# Patient Record
Sex: Female | Born: 1935 | Race: White | Hispanic: No | Smoking: Former smoker
Health system: Southern US, Community
[De-identification: ages and names within clinical notes are randomized; demographics above are authoritative.]

## PROBLEM LIST (undated history)

## (undated) DIAGNOSIS — R001 Bradycardia, unspecified: Secondary | ICD-10-CM

## (undated) DIAGNOSIS — J449 Chronic obstructive pulmonary disease, unspecified: Secondary | ICD-10-CM

## (undated) DIAGNOSIS — I480 Paroxysmal atrial fibrillation: Secondary | ICD-10-CM

## (undated) DIAGNOSIS — L408 Other psoriasis: Secondary | ICD-10-CM

## (undated) DIAGNOSIS — F0390 Unspecified dementia without behavioral disturbance: Secondary | ICD-10-CM

## (undated) DIAGNOSIS — I10 Essential (primary) hypertension: Secondary | ICD-10-CM

## (undated) DIAGNOSIS — C4490 Unspecified malignant neoplasm of skin, unspecified: Secondary | ICD-10-CM

## (undated) DIAGNOSIS — J189 Pneumonia, unspecified organism: Secondary | ICD-10-CM

## (undated) DIAGNOSIS — J9610 Chronic respiratory failure, unspecified whether with hypoxia or hypercapnia: Secondary | ICD-10-CM

## (undated) DIAGNOSIS — M810 Age-related osteoporosis without current pathological fracture: Secondary | ICD-10-CM

## (undated) DIAGNOSIS — N183 Chronic kidney disease, stage 3 unspecified: Secondary | ICD-10-CM

## (undated) DIAGNOSIS — I350 Nonrheumatic aortic (valve) stenosis: Secondary | ICD-10-CM

## (undated) DIAGNOSIS — D649 Anemia, unspecified: Secondary | ICD-10-CM

## (undated) HISTORY — PX: HIP SURGERY: SHX245

## (undated) HISTORY — DX: Bradycardia, unspecified: R00.1

## (undated) HISTORY — PX: CATARACT EXTRACTION: SUR2

## (undated) HISTORY — DX: Chronic kidney disease, stage 3 (moderate): N18.3

## (undated) HISTORY — DX: Chronic obstructive pulmonary disease, unspecified: J44.9

## (undated) HISTORY — DX: Paroxysmal atrial fibrillation: I48.0

## (undated) HISTORY — DX: Nonrheumatic aortic (valve) stenosis: I35.0

## (undated) HISTORY — DX: Unspecified malignant neoplasm of skin, unspecified: C44.90

## (undated) HISTORY — DX: Unspecified dementia, unspecified severity, without behavioral disturbance, psychotic disturbance, mood disturbance, and anxiety: F03.90

## (undated) HISTORY — DX: Chronic kidney disease, stage 3 unspecified: N18.30

## (undated) HISTORY — DX: Chronic respiratory failure, unspecified whether with hypoxia or hypercapnia: J96.10

## (undated) HISTORY — DX: Age-related osteoporosis without current pathological fracture: M81.0

## (undated) HISTORY — DX: Other psoriasis: L40.8

## (undated) HISTORY — DX: Essential (primary) hypertension: I10

---

## 1962-04-02 HISTORY — PX: BREAST SURGERY: SHX581

## 2010-09-25 ENCOUNTER — Inpatient Hospital Stay (HOSPITAL_COMMUNITY)
Admission: EM | Admit: 2010-09-25 | Discharge: 2010-09-29 | DRG: 189 | Disposition: A | Discharge status: Home Health | Payer: Medicare Other | Attending: Family Medicine | Admitting: Family Medicine

## 2010-09-25 ENCOUNTER — Emergency Department (HOSPITAL_COMMUNITY): Payer: Medicare Other

## 2010-09-25 DIAGNOSIS — J441 Chronic obstructive pulmonary disease with (acute) exacerbation: Secondary | ICD-10-CM | POA: Diagnosis present

## 2010-09-25 DIAGNOSIS — Z7982 Long term (current) use of aspirin: Secondary | ICD-10-CM

## 2010-09-25 DIAGNOSIS — D696 Thrombocytopenia, unspecified: Secondary | ICD-10-CM | POA: Diagnosis present

## 2010-09-25 DIAGNOSIS — F17200 Nicotine dependence, unspecified, uncomplicated: Secondary | ICD-10-CM | POA: Diagnosis present

## 2010-09-25 DIAGNOSIS — I10 Essential (primary) hypertension: Secondary | ICD-10-CM | POA: Diagnosis present

## 2010-09-25 DIAGNOSIS — D751 Secondary polycythemia: Secondary | ICD-10-CM | POA: Diagnosis present

## 2010-09-25 DIAGNOSIS — J9600 Acute respiratory failure, unspecified whether with hypoxia or hypercapnia: Principal | ICD-10-CM | POA: Diagnosis present

## 2010-09-25 DIAGNOSIS — R9431 Abnormal electrocardiogram [ECG] [EKG]: Secondary | ICD-10-CM | POA: Diagnosis present

## 2010-09-25 DIAGNOSIS — I279 Pulmonary heart disease, unspecified: Secondary | ICD-10-CM | POA: Diagnosis present

## 2010-09-25 DIAGNOSIS — R5381 Other malaise: Secondary | ICD-10-CM | POA: Diagnosis present

## 2010-09-25 LAB — CBC
MCH: 30.4 pg (ref 26.0–34.0)
MCHC: 32.8 g/dL (ref 30.0–36.0)
RDW: 15.3 % (ref 11.5–15.5)

## 2010-09-25 LAB — POCT I-STAT, CHEM 8
Calcium, Ion: 1.03 mmol/L — ABNORMAL LOW (ref 1.12–1.32)
Glucose, Bld: 97 mg/dL (ref 70–99)
HCT: 66 % — ABNORMAL HIGH (ref 36.0–46.0)
Hemoglobin: 22.4 g/dL (ref 12.0–15.0)
Potassium: 3.6 mEq/L (ref 3.5–5.1)
TCO2: 39 mmol/L (ref 0–100)

## 2010-09-25 LAB — DIFFERENTIAL
Basophils Absolute: 0.1 10*3/uL (ref 0.0–0.1)
Basophils Relative: 1 % (ref 0–1)
Eosinophils Absolute: 0 10*3/uL (ref 0.0–0.7)
Eosinophils Relative: 0 % (ref 0–5)
Monocytes Absolute: 0.5 10*3/uL (ref 0.1–1.0)
Neutro Abs: 3 10*3/uL (ref 1.7–7.7)

## 2010-09-25 LAB — CK TOTAL AND CKMB (NOT AT ARMC): Total CK: 77 U/L (ref 7–177)

## 2010-09-25 LAB — PRO B NATRIURETIC PEPTIDE: Pro B Natriuretic peptide (BNP): 19242 pg/mL — ABNORMAL HIGH (ref 0–450)

## 2010-09-26 DIAGNOSIS — R0602 Shortness of breath: Secondary | ICD-10-CM

## 2010-09-26 LAB — BLOOD GAS, ARTERIAL
Acid-Base Excess: 15.1 mmol/L — ABNORMAL HIGH (ref 0.0–2.0)
Drawn by: 24513
TCO2: 43.7 mmol/L (ref 0–100)
pCO2 arterial: 76.9 mmHg (ref 35.0–45.0)
pH, Arterial: 7.35 (ref 7.350–7.400)
pO2, Arterial: 35.6 mmHg — CL (ref 80.0–100.0)

## 2010-09-26 LAB — CBC
MCV: 93.8 fL (ref 78.0–100.0)
Platelets: 92 10*3/uL — ABNORMAL LOW (ref 150–400)
RBC: 5.97 MIL/uL — ABNORMAL HIGH (ref 3.87–5.11)
RDW: 15.5 % (ref 11.5–15.5)
WBC: 4.4 10*3/uL (ref 4.0–10.5)

## 2010-09-26 LAB — BASIC METABOLIC PANEL
CO2: 39 mEq/L — ABNORMAL HIGH (ref 19–32)
Chloride: 95 mEq/L — ABNORMAL LOW (ref 96–112)
Creatinine, Ser: 0.58 mg/dL (ref 0.50–1.10)
GFR calc Af Amer: >60 mL/min (ref >60)
Potassium: 4.5 mEq/L (ref 3.5–5.1)

## 2010-09-26 LAB — DIFFERENTIAL
Basophils Absolute: 0.1 10*3/uL (ref 0.0–0.1)
Eosinophils Absolute: 0.1 10*3/uL (ref 0.0–0.7)
Eosinophils Relative: 1 % (ref 0–5)
Lymphocytes Relative: 24 % (ref 12–46)
Lymphs Abs: 1 10*3/uL (ref 0.7–4.0)
Neutrophils Relative %: 59 % (ref 43–77)

## 2010-09-26 LAB — MRSA PCR SCREENING: MRSA by PCR: NEGATIVE

## 2010-09-28 LAB — CBC
HCT: 53.8 % — ABNORMAL HIGH (ref 36.0–46.0)
Hemoglobin: 17.6 g/dL — ABNORMAL HIGH (ref 12.0–15.0)
MCV: 92.8 fL (ref 78.0–100.0)
RBC: 5.8 MIL/uL — ABNORMAL HIGH (ref 3.87–5.11)
WBC: 6.1 10*3/uL (ref 4.0–10.5)

## 2010-09-28 LAB — BASIC METABOLIC PANEL
BUN: 13 mg/dL (ref 6–23)
CO2: 38 mEq/L — ABNORMAL HIGH (ref 19–32)
Chloride: 95 mEq/L — ABNORMAL LOW (ref 96–112)
Creatinine, Ser: 0.71 mg/dL (ref 0.50–1.10)
GFR calc Af Amer: >60 mL/min (ref >60)
Glucose, Bld: 137 mg/dL — ABNORMAL HIGH (ref 70–99)
Potassium: 4.1 mEq/L (ref 3.5–5.1)

## 2010-09-28 LAB — URINALYSIS, ROUTINE W REFLEX MICROSCOPIC
Bilirubin Urine: NEGATIVE
Glucose, UA: NEGATIVE mg/dL
Hgb urine dipstick: NEGATIVE
Ketones, ur: NEGATIVE mg/dL
Nitrite: NEGATIVE
Specific Gravity, Urine: 1.018 (ref 1.005–1.030)
pH: 6.5 (ref 5.0–8.0)

## 2010-09-29 ENCOUNTER — Encounter: Payer: Self-pay | Admitting: Emergency Medicine

## 2010-09-29 LAB — URINE CULTURE
Colony Count: NO GROWTH
Culture  Setup Time: 201206281617

## 2010-10-02 ENCOUNTER — Ambulatory Visit (INDEPENDENT_AMBULATORY_CARE_PROVIDER_SITE_OTHER): Payer: Medicare Other | Admitting: Emergency Medicine

## 2010-10-02 ENCOUNTER — Telehealth: Payer: Self-pay | Admitting: Emergency Medicine

## 2010-10-02 ENCOUNTER — Encounter: Payer: Self-pay | Admitting: Emergency Medicine

## 2010-10-02 DIAGNOSIS — J438 Other emphysema: Secondary | ICD-10-CM

## 2010-10-02 DIAGNOSIS — I10 Essential (primary) hypertension: Secondary | ICD-10-CM | POA: Insufficient documentation

## 2010-10-02 DIAGNOSIS — J449 Chronic obstructive pulmonary disease, unspecified: Secondary | ICD-10-CM

## 2010-10-02 MED ORDER — IPRATROPIUM-ALBUTEROL 0.5-2.5 (3) MG/3ML IN SOLN: 3.0000 mL | Freq: Four times a day (QID) | RESPIRATORY_TRACT | Status: DC

## 2010-10-02 NOTE — Assessment & Plan Note (Signed)
Will change the combivent bid to DuoNebs qid  + pulmicort nebs bid Consider full PFT at a later date Congratulated her smoking cessation O2 at all times.

## 2010-10-02 NOTE — Patient Instructions (Signed)
Start using DuoNebs 4 times a day Use your budesonide nebs 2 times a day Wear your oxygen at all times Follow up with Dr Delton Coombes in 1 month

## 2010-10-02 NOTE — Progress Notes (Signed)
  Subjective:    Patient ID: Paula Contreras, female    DOB: 1934/01/06, 77 y.o.   MRN: [id number]  HPI 77 yo longstanding smoker (~100 pk-yrs), never dx w COPD before but was admitted to [facility] 6/12. Found to be hypoxic, started on O2 and on combivent bid + pulmicort. Minimal exertional tolerance. Had been dealing w fatigue, malaise - may be a bit better since the hospitalization.     Review of Systems  Constitutional: Positive for appetite change and unexpected weight change.  HENT: Positive for congestion.   Respiratory: Positive for cough and shortness of breath.    Past Medical History  Diagnosis Date  . Cataract   . Other psoriasis and similar disorders     psoriasis vs eczema     Family History  Problem Relation Age of Onset  . Asthma Mother   . Asthma Maternal Grandmother   . Lung cancer Father   . Lung cancer Paternal Uncle     father's twin brother     History   Social History  . Marital Status: Unknown    Spouse Name: N/A    Number of Children: N/A  . Years of Education: N/A   Occupational History  . data entry specialist     retired   Social History Main Topics  . Smoking status: Former Smoker -- 1.0 packs/day for 55 years    Quit date: 09/25/2010  . Smokeless tobacco: Not on file  . Alcohol Use: No  . Drug Use: No  . Sexually Active: Not on file   Other Topics Concern  . Not on file   Social History Narrative  . No narrative on file     Allergies  Allergen Reactions  . Codeine Nausea And Vomiting     No outpatient prescriptions prior to visit.         Objective:   Physical Exam Gen: Pleasant, thin, in no distress,  normal affect on O2  ENT: No lesions,  mouth clear,  oropharynx clear, no postnasal drip, decreased hearing  Neck: No JVD, no TMG, no carotid bruits  Lungs: distant no wheezing  Cardiovascular: RRR, heart sounds normal, no murmur or gallops, no peripheral edema  Musculoskeletal: No deformities, no cyanosis or  clubbing  Neuro: alert, non focal  Skin: Warm, no lesions or rashes        Assessment & Plan:  COPD (chronic obstructive pulmonary disease) Will change the combivent bid to DuoNebs qid  + pulmicort nebs bid Consider full PFT at a later date Congratulated her smoking cessation O2 at all times.

## 2010-10-02 NOTE — H&P (Signed)
Paula Contreras, Paula Contreras NO.:  [id number]  MEDICAL RECORD NO.:  [id number]  LOCATION:  [facility]                         FACILITY:  [facility]  PHYSICIAN:  Paula Contreras, M.D.DATE OF BIRTH:  Feb 19, 1934  DATE OF ADMISSION:  09/25/2010 DATE OF DISCHARGE:                             HISTORY & PHYSICAL   PRIMARY CARE PHYSICIAN:  Unassigned.  CHIEF COMPLAINT:  Hypoxia.  PRESENT ILLNESS:  Ms. Paula Contreras is a very pleasant 77 year old female who lives independently in the [place] area.  She has not sought regular medical attention and does not have a primary care physician.  She has had no type of primary care or routine screening medical care in 50+ years.  She has known cataract and [facility] to have her cataracts extracted.  While there she was found to have room air O2 sats of 65-68. She denied any symptoms whatsoever.  She was sent to the emergency room for evaluation.  In the emergency room, the patient had O2 saturations of 75-77% on room air.  Again she denied symptoms.  She does admit to smoking reportedly 1 pack a day for approximately 50-55 years.  She denies occupational exposure to cotton dust or other known pulmonary pathogens.  She denies any chest pain.  She states that she is somnolent quite frequently but she is able to exert herself without any difficulty.  She shops for herself and drives without difficulty.  She denies any fevers, chills, cough, or expectoration of sputum.  REVIEW OF SYSTEMS:  Comprehensive review of systems is unremarkable with exception to the positive elements noted in the history of present illness above.  PAST MEDICAL HISTORY: 1. Cataracts. 2. Possible psoriasis versus eczema - followed by Dr. Dorinda Contreras.  OUTPATIENT MEDICATIONS:  None.  ALLERGIES:  CODEINE.  FAMILY HISTORY:  Noncontributory but reviewed in detail with the patient.  SOCIAL HISTORY:  The  patient lives in [place].  She lives by herself. She is retired as a Dietitian.  She does not consume alcohol to significant amounts.  DATA REVIEWED:  Hemoglobin is 19.2.  White count and platelet count are normal.  Sodium, potassium chloride, BUN, creatinine, and serum glucose are normal.  Bicarb is elevated at 39.  Pro BNP was 19,242.  Troponin was within normal range.  Chest x-ray reveals severe COPD with flattening of the diaphragms.  A 12-lead EKG reveals 76 beats per minute with sinus rhythm but diffusely appreciated T-wave inversions.  PHYSICAL EXAMINATION:  VITAL SIGNS:  Temperature 98.9, Contreras pressure 155/89, heart rate 75, respiratory rate 18, O2 sats 93% on 2 liters per minute nasal cannula and 77% on room air. GENERAL:  Thin frail-appearing female with a barrel chest, but in no acute respiratory distress. HEENT:  Normocephalic, atraumatic.  Pupils are equal, round, and reactive.  Oral cavity and oral mucosa are unrevealing and unremarkable. NECK:  No appreciable JVD at 30 degrees. LUNGS:  Very poor air movement throughout all fields with very distant breath sounds.  Mild diffuse expiratory wheezes appreciable and there are no focal crackles.  CARDIOVASCULAR:  Heart sounds are distant but the patient has regular rate and rhythm  without gallop or rub or appreciable murmur with a normal S1 and S2. ABDOMEN:  Nontender, nondistended, soft bowel sounds present.  No organomegaly.  No rebound or ascites. EXTREMITIES:  Trace bilateral lower extremity edema without cyanosis or clubbing. CUTANEOUS:  The patient has a large silver plaque on the anterior frontal region of her scalp just inside her hairline which she states is chronic and appears to be consistent with psoriasis versus eczema.  She has the same type plaque within her external auditory canals and on her legs bilaterally. NEUROLOGIC:  Alert and oriented x4.  Cranial nerves II-XII intact bilaterally, 4+/5  strength in bilateral upper and lower extremities. Intact sensation to touch throughout.  No Babinski.  IMPRESSION AND PLAN: 1. Severe hypoxia - Ms. [name] is suffering with severe hypoxia.     This is likely due to previously undiagnosed severe emphysema due     to a longstanding history of tobacco abuse.  I suspect that she     actually smokes more than a pack a day.  In that the patient has no     primary care physician and no reliable followup, we will need to     admit her to the hospital under observation status to assure that     all arrangements can be made for her to get home O2.  She will also     need consistent outpatient followup for her emphysema.     Fortunately, she is asymptomatic at the present time.  It is not     likely that she would remain that way much longer.  We will     administer O2 at 2 liters per minute nasal cannula for now.  In the     morning, I will assess a baseline ABG on room air.  Additionally,     we will obtain PFTs, both pre and post bronchodilator therapy and     also with a DLCO.  She will be referred to pulmonologist at the     time of her discharge if clearly indicated based on symptomatology.     We will initiate beta agonist bronchodilators for now and consider     their ongoing empiric use at the time of her discharge. 2. Polycythemia - this appears to be clearly a secondary polycythemia     due to chronic hypoxia in the phase of ongoing tobacco abuse.  We     will place the patient on aspirin a day.  We will monitor her     hemoglobin, but I doubt that any further treatment short of ongoing     use of oxygen will be required.  There is very little suspicion of     polycythemia vera in this clinical situation. 3. Markedly elevated pro BNP - this is likely secondary to pulmonary     hypertension and possibly even right-sided heart failure.  We will     check a transthoracic echocardiogram to approximate the patient's     pulmonary artery  pressures as the patient may need consideration     for evaluation as treatment of severe pulmonary hypertension. 4. EKG changes - the patient has never had chest pain or even dyspnea     on exertion by her report.  She does have diffuse T-wave changes,     specifically most appreciable in the inferior leads and some of the     lateral and septal leads.  We will obtain a transthoracic     echocardiogram  for the dual indication of wall motion abnormalities     as well as right heart failure as discussed above.  If wall motion     abnormalities are seen, she would then need referral for cardiac     eval as well. 5. Primary care issues - Ms. Laske admits that she has not had a     Pap smear nor a mammogram in "quite some time."  I suspect this     means never.  She will need multiple screening procedures done.     This will be referred to her primary care physician at the time of     her discharge.  We will ask social work to assist in arranging home     O2 and also in connecting her with a followup appointment with the     primary care physician prior to her discharge. 6. Elevated Contreras pressure - at this time I suspect the patient     probably has a chronic longstanding untreated hypertension.  We     cannot make that diagnosis at this time.  We will need to follow     the patient's Contreras pressure trend and consider initiating     treatment if her Contreras pressure remains elevated through her     hospital stay.     Paula Contreras, M.D.     JTM/MEDQ  D:  09/25/2010  T:  09/25/2010  Job:  [id number]  Electronically Signed by Jetty Duhamel M.D. on 10/02/2010 06:33:31 PM

## 2010-10-02 NOTE — Telephone Encounter (Signed)
Will forward to [facility], per Lawson Fiscal, they have the rx for this to give to [facility].

## 2010-10-02 NOTE — Telephone Encounter (Signed)
Order was given to [facility] today to get nebulizer and meds

## 2010-10-02 NOTE — Discharge Summary (Signed)
Paula Contreras, Paula Contreras NO.:  [id number]  MEDICAL RECORD NO.:  [id number]  LOCATION:  5123                         FACILITY:  [facility]  PHYSICIAN:  Lonia Blood, M.D.DATE OF BIRTH:  1933/06/27  DATE OF ADMISSION:  09/25/2010 DATE OF DISCHARGE:  09/29/2010                              DISCHARGE SUMMARY   DISCHARGING PHYSICIAN:  Lonia Blood, MD  PRIMARY CARE PHYSICIAN:  Unassigned at time of admission.  CHIEF COMPLAINT/REASON FOR ADMISSION:  Paula Contreras is a 77 year old female patient, who was sent over to the ER after being found to be acutely hypoxic.  She was at the eye surgeon's office and was preparing to undergo cataract procedure and was found to have room air saturations of 65% to 68%.  In the emergency department, the patient had room air saturations of 75% to 77%.  She does have a longstanding history of smoking 1 packet of cigarettes a day for at least 50-55 years.  Uponinitial evaluation by the admitting physician, she was somnolent but was arousable and able to exert herself without any difficulty.  On initial clinical exam, the patient was afebrile with temperature of 98.9, BP 155/89, heart rate 75, respirations 18, and O2 sats 93% on 2 liters per minute 77% on room air.  General appearance was a frail female with barrel chest, no acute respiratory distress.  Physical exam was otherwise unremarkable.  The patient also was noted to have a large silver plaque on the anterior frontal region of her scalp, which she states is chronic.  This plaque has the appearance consistent with psoriasis, possible eczema.  The patient endorses she has already been following with a dermatologist for this.  Please refer to the dictated history and physical exam for further details.  PAST MEDICAL HISTORY: 1. Cataracts. 2. Psoriasis versus eczema followed by Paula Contreras. 3. Longstanding tobacco abuse one pack a day times 50-55 years.  ADMITTING  DIAGNOSES: 1. Acute hypoxemic respiratory failure, presumed secondary to     undiagnosed severe emphysema related to longstanding tobacco abuse. 2. Asymptomatic polycythemia, most likely this is secondary due to     chronic hypoxemia from ongoing tobacco abuse. 3. Markedly elevated proBNP 19,242, most likely related to pulmonary     hypertension and right-sided heart failure. 4. Nonspecific diffuse ST changes on EKG in a patient without chest     pain. 5. Hypertension, untreated at time of admission. 6. Primary care issues.  The patient has not seen a primary care     physician in multiple years.  DIAGNOSTICS: 1. Two-view chest x-ray on September 25, 2010, shows severe COPD and     chronic interstitial lung disease.  No active disease. 2. EKG on September 25, 2010, shows nonspecific ST changes involving T-wave     inversion in leads II, III, aVF, V1 through V4 without any ST-     segment elevation, normal QRS with no evidence of left bundle-     branch block. 3. 2-D echocardiogram on September 26, 2010, that shows mild LVH.  There is    systolic function with an estimated ejection fraction of 65% to 70%     and mild dilatation of the  right ventricle with tricuspid valve     demonstrated trivial regurgitation and no mention of whether the     patient had underlying pulmonary hypertension.  LABORATORY DATA:  At admission, white count was 4400, hemoglobin 19.2, hematocrit 58.6, platelets 124,000, neutrophils 68%.  Most recent CBC on September 28, 2010, shows platelet count still slightly low at 111.  WBC 6100, hemoglobin 17.6, hematocrit 53.8.  At admission, sodium was 140, potassium 3.6, chloride 92, CO2 of 39, glucose 97, BUN 14, creatinine 0.9.  Cardiac isoenzymes were checked x1 and were within normal limits. ABG this admission on room air pH 7.35, pCO2 of 77, pO2 of 36, bicarbonate 42, pCO2 of 44, acid-base excess 15, O2 sat 65%.  MRSA PCR screening was negative.  Urinalysis was negative.  Most recent  BMET on September 28, 2010, sodium 39, potassium 4.1, chloride 95, CO2 of 38, glucose 137, BUN 13, creatinine 0.71.  HOSPITAL COURSE: 1. Acute hypoxemic and hypercarbic respiratory failure secondary to     severe underlying COPD with acute exacerbation.  The patient     presented without having a known diagnosis of COPD, but with a     longstanding history of significant tobacco abuse and was     symptomatic with her hypercarbia as well as her hypoxemia.  She was     started on oxygen therapy, bronchodilator therapy, and was given a     short course of steroids and has markedly improved.  Her baseline     sats are 98% to 99% on 3 liters of nasal cannula oxygen, which she     will continue at home.  She is currently on prednisone taper.     Budesonide nebulizers were also added this admission and we will     continue this as an inhaler as well as other bronchodilator via MDI route.  She has been set up for outpatient     oxygen and COPD teaching after discharge and RN follow-up after     discharge. 2. Markedly elevated pro-BNP, question of right heart failure.  The     patient's echocardiogram shows only mild dilatation of the RV and     only mild tricuspid regurgitation without evidence clinically of     CHF exacerbation.  Again, her pulmonary airway pressures were not     estimated, so it is unclear if the patient actually has pulmonary     hypertension.  She had no evidence of acute heart failure     exacerbation clinically and markedly improved from a respiratory     standpoint with treatment of COPD.  The patient again will be     following up with pulmonary medicine after discharge. 3. Uncontrolled hypertension, new diagnosis.  Benicar      was initiated this admission.  The patient's blood pressure is much     better controlled with a blood pressure of 166/77 today and heart     rate 62.  She will continue this medication after discharge. 4. Ongoing tobacco abuse.  Tobacco cessation  counseling has been     offered.  The patient was started on NicoDerm patch, which she can     obtain over-the-counter after discharge. 5. Mild thrombocytopenia.  The patient presented with low platelet     count in 120s and most recent CBC shows platelets of 111,000.  She     was previously taking Aleve over-the-counter p.r.n.  We will stop     this medication, but we will  allow her to take low-dose aspirin 81     mg at this point due to her multiple cardiac risk factors.  She     needs to follow up with her primary care physician with additional     laboratory values obtained to determine if the thrombocytopenia has     resolved.  She may eventually need hematological consultation as an     outpatient if the thrombocytopenia does not resolve or worsens. 6. Abnormal EKG.  The patient presented with an EKG with T-wave     inversions without having any evidence of chest pain.     Echocardiogram shows normal LVEF without any regional wall motion     abnormalities.  FINAL DISCHARGE DIAGNOSES: 1. Acute hypoxemic and hypercarbic respiratory failure secondary to     severe underlying chronic obstructive pulmonary disease with acute     exacerbation, now O2 dependent. 2. Hypertension, moderately controlled, new diagnosis this admission. 3. Elevated proBNP without evidence of heart failure. 4. Ongoing tobacco abuse. 5. Thrombocytopenia without evidence of bleeding. 6. Polycythemia secondary to chronic hypoxemia due to chronic     obstructive pulmonary disease. 7. Deconditioning, chronic.  DISCHARGE MEDICATIONS: 1. Albuterol metered-dose inhaler use p.r.n. 2 puffs every 2 hours as     needed for shortness of breath or wheezing with instructions to     notify your physician if you have to use this inhaler frequently     such as using every 2 hours for more than 24 hours. 2. Aspirin 81 mg p.o. daily.  This medication is available over-the-     counter. 3. Pulmicort inhaler 2 puffs daily  b.i.d. to treat emphysema. 4. Combivent inhaler 2 puffs b.i.d. to treat emphysema. 5. Nicotine patch 21 mg every 24 hours daily.  This medication is also     available over-the-counter. 6. Benicar 20 mg by mouth daily. 7. Prednisone taper 10 mg tablets 30 mg for the next 2 days and 20 mg     for the following 2 days, then 10 mg the next 2 days and     discontinue. 8. Over-the-counter Extra Strength Tylenol 500 mg 1 tablet twice daily     as needed for pain.  STOP FOLLOWING MEDICATIONS:  Aleve 220 mg 1 tablet by mouth daily as needed for pain.  ADDITIONAL DISCHARGE INSTRUCTIONS: 1. Return to work not applicable. 2. Activity:  Increase activity slowly.  May use cane as prior to     admission, rolling walker if needed.  Continue home health PT if     recommended.  Recommendation from PT is to return to home with your     brother for additional 24 hours supervision for the next several     days. 3. Diet:  No restrictions. 4. Home health care:  [facility]     patient.  FOLLOW UP APPOINTMENTS: 1. Dr. Delton Coombes with Pulmonology will be seeing the patient on October 02, 2010, at 10:15 a.m. 2. Establish with primary care physician, Ms. Marja Kays with case     management will be contacting [facility] to arrange followup     appointments.  We are having difficulty finding a physician that     accepts new Medicare patients for this patient.  The patient has     been instructed if she has not heard from Ms. Hayden Pedro by Monday     afternoon, that she is to contact [facility] herself at [phone].  Ms. Hervey Ard telephone number is [phone].  The reason for     followup with primary is to follow her high blood pressure as well     as her low platelet count.     Allison L. Rennis Harding, N.P.   ______________________________ Lonia Blood, M.D.    ALE/MEDQ  D:  09/29/2010  T:  09/29/2010  Job:  [id number]  cc:   Leslye Peer, MD  Electronically  Signed by Junious Silk N.P. on 10/02/2010 07:26:40 AM Electronically Signed by Jetty Duhamel M.D. on 10/02/2010 06:33:37 PM

## 2010-10-18 ENCOUNTER — Telehealth: Payer: Self-pay | Admitting: Emergency Medicine

## 2010-10-18 DIAGNOSIS — J449 Chronic obstructive pulmonary disease, unspecified: Secondary | ICD-10-CM

## 2010-10-18 MED ORDER — BUDESONIDE 0.25 MG/2ML IN SUSP: 0.2500 mg | Freq: Two times a day (BID) | RESPIRATORY_TRACT | Status: DC

## 2010-10-18 NOTE — Telephone Encounter (Signed)
I spoke with the pt and advised that this type of med might not be covered at local pharmacy and they we should try to send it through [facility] with her duoneb. Pt states understanding. Pulmicort was not on pt med list but according to last OV note RB told her to continue the pulmicort bid.  Carron Curie, CMA

## 2010-10-28 NOTE — Discharge Summary (Signed)
  Paula Contreras, Paula Contreras NO.:  [id number]  MEDICAL RECORD NO.:  [id number]  LOCATION:                                 FACILITY:  PHYSICIAN:  Lonia Blood, M.D.DATE OF BIRTH:  Dec 10, 1933  DATE OF ADMISSION:  09/25/2010 DATE OF DISCHARGE:  09/29/2010                              DISCHARGE SUMMARY   ADDENDUM  We wanted to clarify that the patient is now also on new nasal cannula oxygen at 3 liters per minute and this will continue after discharge with Home Health following.     Allison L. Rennis Harding, N.P.   ______________________________ Lonia Blood, M.D.    ALE/MEDQ  D:  09/29/2010  T:  09/30/2010  Job:  [id number]  Electronically Signed by Junious Silk N.P. on 10/12/2010 11:12:19 AM Electronically Signed by Jetty Duhamel M.D. on 10/28/2010 04:30:37 PM

## 2010-11-01 DIAGNOSIS — D696 Thrombocytopenia, unspecified: Secondary | ICD-10-CM

## 2010-11-01 DIAGNOSIS — J441 Chronic obstructive pulmonary disease with (acute) exacerbation: Secondary | ICD-10-CM

## 2010-11-01 DIAGNOSIS — I10 Essential (primary) hypertension: Secondary | ICD-10-CM

## 2010-11-01 DIAGNOSIS — M6281 Muscle weakness (generalized): Secondary | ICD-10-CM

## 2010-11-02 ENCOUNTER — Ambulatory Visit (INDEPENDENT_AMBULATORY_CARE_PROVIDER_SITE_OTHER): Payer: Medicare Other | Admitting: Emergency Medicine

## 2010-11-02 ENCOUNTER — Encounter: Payer: Self-pay | Admitting: Emergency Medicine

## 2010-11-02 VITALS — BP 148/84 | HR 66 | Temp 97.9°F | Ht 67.0 in | Wt 112.8 lb

## 2010-11-02 DIAGNOSIS — J449 Chronic obstructive pulmonary disease, unspecified: Secondary | ICD-10-CM

## 2010-11-02 DIAGNOSIS — J4489 Other specified chronic obstructive pulmonary disease: Secondary | ICD-10-CM

## 2010-11-02 NOTE — Progress Notes (Signed)
  Subjective:    Patient ID: Paula Contreras, female    DOB: 1933/05/20, 77 y.o.   MRN: [id number]  HPI 77 yo longstanding smoker (~100 pk-yrs), never dx w COPD before but was admitted to [facility] 6/12. Found to be hypoxic, started on O2 and on combivent bid + pulmicort. Minimal exertional tolerance. Had been dealing w fatigue, malaise - may be a bit better since the hospitalization.    ROV 11/02/10 -- follow up for dyspnea and COPD, hypoxemia. We clarified her BD regimen - duonebs qid and pulmicort bid. She denies any dyspnea, she is less lethargic and sleepy than she was before. She denies any cough, wheeze.       Objective:   Physical Exam Gen: Pleasant, thin, in no distress,  normal affect on O2  ENT: No lesions,  mouth clear,  oropharynx clear, no postnasal drip, decreased hearing  Neck: No JVD, no TMG, no carotid bruits  Lungs: distant no wheezing  Cardiovascular: RRR, heart sounds normal, no murmur or gallops, no peripheral edema  Musculoskeletal: No deformities, no cyanosis or clubbing  Neuro: alert, non focal  Skin: Warm, no lesions or rashes    Assessment & Plan:  COPD (chronic obstructive pulmonary disease) We will continue DuoNebs 4 times a day.  Use Pulmicort nebs twice a day.  Wear your oxygen at all times.  Congratulations on quitting smoking.  Follow up with Dr Delton Coombes in 4 months or sooner if you have any problems.

## 2010-11-02 NOTE — Assessment & Plan Note (Signed)
We will continue DuoNebs 4 times a day.  Use Pulmicort nebs twice a day.  Wear your oxygen at all times.  Congratulations on quitting smoking.  Follow up with Dr Delton Coombes in 4 months or sooner if you have any problems.

## 2010-11-02 NOTE — Patient Instructions (Signed)
We will continue DuoNebs 4 times a day.  Use Pulmicort nebs twice a day.  Wear your oxygen at all times.  Congratulations on quitting smoking.  Follow up with Dr Charlet Harr in 4 months or sooner if you have any problems.  

## 2011-03-12 ENCOUNTER — Encounter: Payer: Self-pay | Admitting: Emergency Medicine

## 2011-03-12 ENCOUNTER — Ambulatory Visit (INDEPENDENT_AMBULATORY_CARE_PROVIDER_SITE_OTHER): Payer: Medicare Other | Admitting: Emergency Medicine

## 2011-03-12 VITALS — BP 118/60 | HR 82 | Temp 97.9°F | Ht 67.0 in | Wt 133.6 lb

## 2011-03-12 DIAGNOSIS — J449 Chronic obstructive pulmonary disease, unspecified: Secondary | ICD-10-CM

## 2011-03-12 NOTE — Patient Instructions (Signed)
Please continue your nebulized medications, DuoNebs and Pulmicort as you have been taking them.  Wear your oxygen at all times Be sure to discuss your decreased hearing and wax in your ears with your PCP on 12/18 visit Follow with Dr Delton Coombes in 6 months or sooner if you have any problems.

## 2011-03-12 NOTE — Progress Notes (Signed)
  Subjective:    Patient ID: Paula Contreras, female    DOB: [birth year], 77 y.o.   MRN: [id number]  HPI 77 yo longstanding smoker (~100 pk-yrs), never dx w COPD before but was admitted to [facility] 6/12. Found to be hypoxic, started on O2 and on combivent bid + pulmicort. Minimal exertional tolerance. Had been dealing w fatigue, malaise - may be a bit better since the hospitalization.    ROV 11/02/10 -- follow up for dyspnea and COPD, hypoxemia. We clarified her BD regimen - duonebs qid and pulmicort bid. She denies any dyspnea, she is less lethargic and sleepy than she was before. She denies any cough, wheeze.   ROV 03/12/11 -- COPD, hypoxemia. She remains on Duonebs and pulmicort, sometimes DuoNebs not qid. No hosp, or exacerbations. No wheeze or cough. Clinically stable. Her hearing is severely decreased.     Objective:   Physical Exam Gen: Pleasant, thin, in no distress,  normal affect on O2  ENT: No lesions,  mouth clear,  oropharynx clear, no postnasal drip, decreased hearing - L is occluded w wax  Neck: No JVD, no TMG, no carotid bruits  Lungs: distant no wheezing  Cardiovascular: RRR, heart sounds normal, no murmur or gallops, no peripheral edema  Musculoskeletal: No deformities, no cyanosis or clubbing  Neuro: alert, non focal  Skin: Warm, no lesions or rashes    Assessment & Plan:  COPD (chronic obstructive pulmonary disease) - same nebs  - o2 at all times - rov in 6 months

## 2011-03-12 NOTE — Assessment & Plan Note (Signed)
-   same nebs  - o2 at all times - rov in 6 months

## 2011-09-20 ENCOUNTER — Encounter: Payer: Self-pay | Admitting: Emergency Medicine

## 2011-09-20 ENCOUNTER — Ambulatory Visit (INDEPENDENT_AMBULATORY_CARE_PROVIDER_SITE_OTHER): Payer: Medicare Other | Admitting: Emergency Medicine

## 2011-09-20 VITALS — BP 124/78 | HR 72 | Temp 98.3°F | Ht 67.5 in | Wt 129.2 lb

## 2011-09-20 DIAGNOSIS — J449 Chronic obstructive pulmonary disease, unspecified: Secondary | ICD-10-CM

## 2011-09-20 NOTE — Patient Instructions (Addendum)
Stop your Pulmicort nebulizer Use your DuoNebs only as needed for shortness of breath (you do not need to take on a schedule anymore) We rechecked your oxygen level to requalify. You need to wear your oxygen at 2L/min at rest and with exertion.

## 2011-09-20 NOTE — Assessment & Plan Note (Signed)
Has tolerated d/c all her nebs!! - requalify for O2 today - will d/c pulmicort altogether - change duonebs to prn only - rov 6 mon

## 2011-09-20 NOTE — Progress Notes (Signed)
  Subjective:    Patient ID: Paula Contreras, female    DOB: 1934-03-17, 78 y.o.   MRN: [id number] HPI 78 yo longstanding smoker (~100 pk-yrs), never dx w COPD before but was admitted to [facility] 6/12. Found to be hypoxic, started on O2 and on combivent bid + pulmicort. Minimal exertional tolerance. Had been dealing w fatigue, malaise - may be a bit better since the hospitalization.    ROV 11/02/10 -- follow up for dyspnea and COPD, hypoxemia. We clarified her BD regimen - duonebs qid and pulmicort bid. She denies any dyspnea, she is less lethargic and sleepy than she was before. She denies any cough, wheeze.   ROV 03/12/11 -- COPD, hypoxemia. She remains on Duonebs and pulmicort, sometimes DuoNebs not qid. No hosp, or exacerbations. No wheeze or cough. Clinically stable. Her hearing is severely decreased.   ROV 09/20/11 -- COPD, hypoxemia. She remains on Duonebs and pulmicort, sometimes DuoNebs not qid. She needs to requalify for O2 today. Her breathing has been a little worse since she stopped her nebs.  Has not had any exacerbations or hospitalizations. She has not been using her nebulized meds for the last month - they were bothering her eye (she recently had sgy on the R eye).  She is not having any more cough, no wheezing. She does have clear PND.       Objective:   Physical Exam Filed Vitals:   09/20/11 1454  BP: 124/78  Pulse: 72  Temp: 98.3 F (36.8 C)    Gen: Pleasant, thin, in no distress,  normal affect, mild kyphosis  ENT: No lesions,  mouth clear,  oropharynx clear, no postnasal drip,  Neck: No JVD, no TMG, no carotid bruits  Lungs: distant no wheezing  Cardiovascular: RRR, heart sounds normal, no murmur or gallops, no peripheral edema  Musculoskeletal: No deformities, no cyanosis or clubbing  Neuro: alert, non focal  Skin: Warm, no lesions or rashes    Assessment & Plan:  COPD (chronic obstructive pulmonary disease) Has tolerated d/c all her nebs!! - requalify for O2  today - will d/c pulmicort altogether - change duonebs to prn only - rov 6 mon

## 2012-04-04 ENCOUNTER — Ambulatory Visit (INDEPENDENT_AMBULATORY_CARE_PROVIDER_SITE_OTHER): Payer: Medicare Other | Admitting: Emergency Medicine

## 2012-04-04 ENCOUNTER — Encounter: Payer: Self-pay | Admitting: Emergency Medicine

## 2012-04-04 VITALS — BP 92/60 | HR 73 | Temp 97.0°F | Ht 67.0 in | Wt 138.2 lb

## 2012-04-04 DIAGNOSIS — J449 Chronic obstructive pulmonary disease, unspecified: Secondary | ICD-10-CM

## 2012-04-04 NOTE — Progress Notes (Signed)
  Subjective:    Patient ID: Paula Contreras, female    DOB: 1934-02-01, 79 y.o.   MRN: [id number] HPI 79 yo longstanding smoker (~100 pk-yrs), never dx w COPD before but was admitted to [facility] 6/12. Found to be hypoxic, started on O2 and on combivent bid + pulmicort. Minimal exertional tolerance. Had been dealing w fatigue, malaise - may be a bit better since the hospitalization.    ROV 11/02/10 -- follow up for dyspnea and COPD, hypoxemia. We clarified her BD regimen - duonebs qid and pulmicort bid. She denies any dyspnea, she is less lethargic and sleepy than she was before. She denies any cough, wheeze.   ROV 03/12/11 -- COPD, hypoxemia. She remains on Duonebs and pulmicort, sometimes DuoNebs not qid. No hosp, or exacerbations. No wheeze or cough. Clinically stable. Her hearing is severely decreased.   ROV 09/20/11 -- COPD, hypoxemia. She remains on Duonebs and pulmicort, sometimes DuoNebs not qid. She needs to requalify for O2 today. Her breathing has been a little worse since she stopped her nebs.  Has not had any exacerbations or hospitalizations. She has not been using her nebulized meds for the last month - they were bothering her eye (she recently had sgy on the R eye).  She is not having any more cough, no wheezing. She does have clear PND.   ROV 04/04/12 -- follows up for her COPD. Denies any problems or flares.  Last time we stopped pulmicort, changed the DuoNebs to prn. She denies cough, but she is having a lot of clear PND.       Objective:   Physical Exam Filed Vitals:   04/04/12 1417  BP: 92/60  Pulse: 73  Temp: 97 F (36.1 C)    Gen: Pleasant, thin, in no distress,  normal affect, mild kyphosis  ENT: No lesions,  mouth clear,  oropharynx clear, no postnasal drip,  Neck: No JVD, no TMG, no carotid bruits  Lungs: distant no wheezing  Cardiovascular: RRR, heart sounds normal, no murmur or gallops, no peripheral edema  Musculoskeletal: No deformities, no cyanosis or  clubbing  Neuro: alert, non focal  Skin: Warm, no lesions or rashes    Assessment & Plan:  COPD (chronic obstructive pulmonary disease) Stable on minimal meds.  She wasn';t interested in anything to treat PND as a possible COPD trigger. We will continue duonebs prn + O2. ROV 6 mon

## 2012-04-04 NOTE — Patient Instructions (Addendum)
Please continue to wear your oxygen Use DuoNebs if needed for shortness of breath Follow with Dr Delton Coombes in 6 months or sooner if you have any problems

## 2012-04-04 NOTE — Assessment & Plan Note (Signed)
Stable on minimal meds.  She wasn';t interested in anything to treat PND as a possible COPD trigger. We will continue duonebs prn + O2. ROV 6 mon

## 2012-07-14 ENCOUNTER — Other Ambulatory Visit: Payer: Self-pay | Admitting: Family Medicine

## 2012-07-14 DIAGNOSIS — E2839 Other primary ovarian failure: Secondary | ICD-10-CM

## 2012-07-14 DIAGNOSIS — Z1231 Encounter for screening mammogram for malignant neoplasm of breast: Secondary | ICD-10-CM

## 2012-08-15 ENCOUNTER — Ambulatory Visit
Admission: RE | Admit: 2012-08-15 | Discharge: 2012-08-15 | Disposition: A | Discharge status: Home / Self Care | Payer: 59 | Source: Ambulatory Visit | Attending: Family Medicine | Admitting: Family Medicine

## 2012-08-15 ENCOUNTER — Ambulatory Visit
Admission: RE | Admit: 2012-08-15 | Discharge: 2012-08-15 | Disposition: A | Discharge status: Home / Self Care | Payer: Medicare Other | Source: Ambulatory Visit | Attending: Family Medicine | Admitting: Family Medicine

## 2012-08-15 DIAGNOSIS — Z1231 Encounter for screening mammogram for malignant neoplasm of breast: Secondary | ICD-10-CM

## 2012-08-15 DIAGNOSIS — E2839 Other primary ovarian failure: Secondary | ICD-10-CM

## 2012-10-16 ENCOUNTER — Ambulatory Visit (INDEPENDENT_AMBULATORY_CARE_PROVIDER_SITE_OTHER): Payer: Medicare Other | Admitting: Emergency Medicine

## 2012-10-16 ENCOUNTER — Encounter: Payer: Self-pay | Admitting: Emergency Medicine

## 2012-10-16 VITALS — BP 130/74 | HR 109 | Temp 98.6°F | Ht 67.0 in | Wt 136.8 lb

## 2012-10-16 DIAGNOSIS — J4489 Other specified chronic obstructive pulmonary disease: Secondary | ICD-10-CM

## 2012-10-16 DIAGNOSIS — J449 Chronic obstructive pulmonary disease, unspecified: Secondary | ICD-10-CM

## 2012-10-16 NOTE — Progress Notes (Signed)
  Subjective:    Patient ID: Paula Contreras, female    DOB: 1933-10-21, 79 y.o.   MRN: [id number] HPI 79 yo longstanding smoker (~100 pk-yrs), never dx w COPD before but was admitted to [facility] 6/12. Found to be hypoxic, started on O2 and on combivent bid + pulmicort. Minimal exertional tolerance. Had been dealing w fatigue, malaise - may be a bit better since the hospitalization.    ROV 11/02/10 -- follow up for dyspnea and COPD, hypoxemia. We clarified her BD regimen - duonebs qid and pulmicort bid. She denies any dyspnea, she is less lethargic and sleepy than she was before. She denies any cough, wheeze.   ROV 03/12/11 -- COPD, hypoxemia. She remains on Duonebs and pulmicort, sometimes DuoNebs not qid. No hosp, or exacerbations. No wheeze or cough. Clinically stable. Her hearing is severely decreased.   ROV 09/20/11 -- COPD, hypoxemia. She remains on Duonebs and pulmicort, sometimes DuoNebs not qid. She needs to requalify for O2 today. Her breathing has been a little worse since she stopped her nebs.  Has not had any exacerbations or hospitalizations. She has not been using her nebulized meds for the last month - they were bothering her eye (she recently had sgy on the R eye).  She is not having any more cough, no wheezing. She does have clear PND.   ROV 04/04/12 -- follows up for her COPD. Denies any problems or flares.  Last time we stopped pulmicort, changed the DuoNebs to prn. She denies cough, but she is having a lot of clear PND.   ROV 10/16/12 -- hx COPD, follows up. She is on duonebs prn, albuterol prn. She has not needed or used any BD's since last time. No flares.  She now follows with Dr Nicholos Johns.       Objective:   Physical Exam Filed Vitals:   10/16/12 1020 10/16/12 1022  BP:  130/74  Pulse:  109  Temp: 98.6 F (37 C)   TempSrc: Oral   Height: 5\' 7"  (1.702 m)   Weight: 136 lb 12.8 oz (62.052 kg)   SpO2:  90%   Gen: Pleasant, thin, in no distress,  normal affect, mild kyphosis  ENT:  No lesions,  mouth clear,  oropharynx clear, no postnasal drip,  Neck: No JVD, no TMG, no carotid bruits  Lungs: distant no wheezing  Cardiovascular: RRR, heart sounds normal, no murmur or gallops, no peripheral edema  Musculoskeletal: No deformities, no cyanosis or clubbing  Neuro: alert, non focal  Skin: Warm, no lesions or rashes   Assessment & Plan:  COPD (chronic obstructive pulmonary disease) She is clinically stable. Has not needed BD's. Uses o2 fairly reliably - encouraged her to use o2 w all exertion, checked her at rest today - discussed BD's > she wants to continue to use prn, feels this is adequate

## 2012-10-16 NOTE — Assessment & Plan Note (Signed)
She is clinically stable. Has not needed BD's. Uses o2 fairly reliably - encouraged her to use o2 w all exertion, checked her at rest today - discussed BD's > she wants to continue to use prn, feels this is adequate

## 2012-10-16 NOTE — Patient Instructions (Addendum)
Please continue to use your DuoNebs and your albuterol as needed Oximetry today shows you need to wear your Oxygen at all times Follow with Dr Delton Coombes in 6 months or sooner if you have any problems

## 2013-04-16 ENCOUNTER — Encounter: Payer: Self-pay | Admitting: Emergency Medicine

## 2013-04-16 ENCOUNTER — Ambulatory Visit (INDEPENDENT_AMBULATORY_CARE_PROVIDER_SITE_OTHER): Payer: Medicare Other | Admitting: Emergency Medicine

## 2013-04-16 VITALS — BP 110/64 | HR 85 | Ht 65.0 in | Wt 148.4 lb

## 2013-04-16 DIAGNOSIS — J449 Chronic obstructive pulmonary disease, unspecified: Secondary | ICD-10-CM

## 2013-04-16 NOTE — Assessment & Plan Note (Signed)
Discussed with her that she is almost certainly undertreated. Would like to trial a BD with her but she has never used with consistency. Will try Anoro since it is once a day.  - spiro today - trial Anoro x 1 month  - rov 1 month to decide whether to continue the Anoro.

## 2013-04-16 NOTE — Addendum Note (Signed)
Addended by: Carlos American A on: 04/16/2013 09:51 AM   Modules accepted: Orders

## 2013-04-16 NOTE — Progress Notes (Signed)
  Subjective:    Patient ID: Paula Contreras, female    DOB: 1934/03/13, 80 y.o.   MRN: [id number] HPI 80 yo longstanding smoker (~100 pk-yrs), never dx w COPD before but was admitted to [facility] 6/12. Found to be hypoxic, started on O2 and on combivent bid + pulmicort. Minimal exertional tolerance. Had been dealing w fatigue, malaise - may be a bit better since the hospitalization.    ROV 11/02/10 -- follow up for dyspnea and COPD, hypoxemia. We clarified her BD regimen - duonebs qid and pulmicort bid. She denies any dyspnea, she is less lethargic and sleepy than she was before. She denies any cough, wheeze.   ROV 03/12/11 -- COPD, hypoxemia. She remains on Duonebs and pulmicort, sometimes DuoNebs not qid. No hosp, or exacerbations. No wheeze or cough. Clinically stable. Her hearing is severely decreased.   ROV 09/20/11 -- COPD, hypoxemia. She remains on Duonebs and pulmicort, sometimes DuoNebs not qid. She needs to requalify for O2 today. Her breathing has been a little worse since she stopped her nebs.  Has not had any exacerbations or hospitalizations. She has not been using her nebulized meds for the last month - they were bothering her eye (she recently had sgy on the R eye).  She is not having any more cough, no wheezing. She does have clear PND.   ROV 04/04/12 -- follows up for her COPD. Denies any problems or flares.  Last time we stopped pulmicort, changed the DuoNebs to prn. She denies cough, but she is having a lot of clear PND.   ROV 10/16/12 -- hx COPD, follows up. She is on duonebs prn, albuterol prn. She has not needed or used any BD's since last time. No flares.  She now follows with Dr Alyson Ingles.   ROV 04/16/13 -- hx COPD, has been managed on prn BD's + O2, used to be on pulmicort. She has not had any flares or hospitalizations. She has SOB w exertion and doing chores. She never uses her duonebs. Discussed today that she is likely undertreated for her COPD.       Objective:   Physical Exam Filed  Vitals:   04/16/13 0915  BP: 110/64  Pulse: 85  Height: 5\' 5"  (1.651 m)  Weight: 148 lb 6.4 oz (67.314 kg)  SpO2: 93%   Gen: Pleasant, thin, in no distress,  normal affect, mild kyphosis  ENT: No lesions,  mouth clear,  oropharynx clear, no postnasal drip,  Neck: No JVD, no TMG, no carotid bruits  Lungs: distant no wheezing  Cardiovascular: RRR, heart sounds normal, no murmur or gallops, no peripheral edema  Musculoskeletal: No deformities, no cyanosis or clubbing  Neuro: alert, non focal  Skin: Warm, no lesions or rashes   Assessment & Plan:  COPD (chronic obstructive pulmonary disease) Discussed with her that she is almost certainly undertreated. Would like to trial a BD with her but she has never used with consistency. Will try Anoro since it is once a day.  - spiro today - trial Anoro x 1 month  - rov 1 month to decide whether to continue the Anoro.

## 2013-04-16 NOTE — Patient Instructions (Signed)
Continue your oxygen as you are using it.  We will perform spirometry test today We will start Anoro one inhalation daily for the next month to see if you benefit Follow with Dr Lamonte Sakai in 1 month

## 2013-05-18 ENCOUNTER — Ambulatory Visit: Payer: Medicare Other | Admitting: Emergency Medicine

## 2013-05-20 ENCOUNTER — Ambulatory Visit: Payer: Medicare Other | Admitting: Emergency Medicine

## 2013-05-27 ENCOUNTER — Ambulatory Visit: Payer: Medicare Other | Admitting: Emergency Medicine

## 2013-06-16 ENCOUNTER — Encounter: Payer: Self-pay | Admitting: Emergency Medicine

## 2013-06-16 ENCOUNTER — Ambulatory Visit (INDEPENDENT_AMBULATORY_CARE_PROVIDER_SITE_OTHER): Payer: Medicare Other | Admitting: Emergency Medicine

## 2013-06-16 VITALS — BP 128/70 | HR 86 | Ht 66.0 in | Wt 151.0 lb

## 2013-06-16 DIAGNOSIS — J449 Chronic obstructive pulmonary disease, unspecified: Secondary | ICD-10-CM

## 2013-06-16 MED ORDER — UMECLIDINIUM-VILANTEROL 62.5-25 MCG/INH IN AEPB: 1.0000 | INHALATION_SPRAY | Freq: Every day | RESPIRATORY_TRACT | Status: DC

## 2013-06-16 NOTE — Addendum Note (Signed)
Addended by: Carlos American A on: 06/16/2013 11:00 AM   Modules accepted: Orders

## 2013-06-16 NOTE — Progress Notes (Signed)
  Subjective:    Patient ID: Paula Contreras, female    DOB: [birth year], 80 y.o.   MRN: [id number] HPI 80 yo longstanding smoker (~100 pk-yrs), never dx w COPD before but was admitted to [facility] 6/12. Found to be hypoxic, started on O2 and on combivent bid + pulmicort. Minimal exertional tolerance. Had been dealing w fatigue, malaise - may be a bit better since the hospitalization.    ROV 11/02/10 -- follow up for dyspnea and COPD, hypoxemia. We clarified her BD regimen - duonebs qid and pulmicort bid. She denies any dyspnea, she is less lethargic and sleepy than she was before. She denies any cough, wheeze.   ROV 03/12/11 -- COPD, hypoxemia. She remains on Duonebs and pulmicort, sometimes DuoNebs not qid. No hosp, or exacerbations. No wheeze or cough. Clinically stable. Her hearing is severely decreased.   ROV 09/20/11 -- COPD, hypoxemia. She remains on Duonebs and pulmicort, sometimes DuoNebs not qid. She needs to requalify for O2 today. Her breathing has been a little worse since she stopped her nebs.  Has not had any exacerbations or hospitalizations. She has not been using her nebulized meds for the last month - they were bothering her eye (she recently had sgy on the R eye).  She is not having any more cough, no wheezing. She does have clear PND.   ROV 04/04/12 -- follows up for her COPD. Denies any problems or flares.  Last time we stopped pulmicort, changed the DuoNebs to prn. She denies cough, but she is having a lot of clear PND.   ROV 10/16/12 -- hx COPD, follows up. She is on duonebs prn, albuterol prn. She has not needed or used any BD's since last time. No flares.  She now follows with Dr Alyson Ingles.   ROV 04/16/13 -- hx COPD, has been managed on prn BD's + O2, used to be on pulmicort. She has not had any flares or hospitalizations. She has SOB w exertion and doing chores. She never uses her duonebs. Discussed today that she is likely undertreated for her COPD.   ROV 06/16/13 -- COPD, returns for eval  after starting Anoro last visit. She reports today that she believes the Anoro helped > was able to do more walking, less dyspnea. She would like to continue it to see how she does.       Objective:   Physical Exam Filed Vitals:   06/16/13 1035  BP: 128/70  Pulse: 86  Height: 5\' 6"  (1.676 m)  Weight: 151 lb (68.493 kg)  SpO2: 92%   Gen: Pleasant, thin, in no distress,  normal affect, mild kyphosis  ENT: No lesions,  mouth clear,  oropharynx clear, no postnasal drip,  Neck: No JVD, no TMG, no carotid bruits  Lungs: distant no wheezing  Cardiovascular: RRR, heart sounds normal, no murmur or gallops, no peripheral edema  Musculoskeletal: No deformities, no cyanosis or clubbing  Neuro: alert, non focal  Skin: Warm, no lesions or rashes   Assessment & Plan:  COPD (chronic obstructive pulmonary disease) She appears to have benefited from the Anoro. Would like to continue this. I have warned her that I do not know what tier this med is on her insurance.  - order anoro - continue o2 at all times 2L/min - rov3

## 2013-06-16 NOTE — Assessment & Plan Note (Signed)
She appears to have benefited from the Anoro. Would like to continue this. I have warned her that I do not know what tier this med is on her insurance.  - order anoro - continue o2 at all times 2L/min - rov3

## 2013-06-16 NOTE — Patient Instructions (Signed)
We will restart Anoro once a day Please continue your oxygen at 2L/min Use your albuterol 2 puffs if you need it for shortness of breath Follow with Dr Lamonte Sakai in 3 months or sooner if you have any problems.

## 2013-07-16 ENCOUNTER — Ambulatory Visit (INDEPENDENT_AMBULATORY_CARE_PROVIDER_SITE_OTHER): Payer: Medicare Other | Admitting: Interventional Cardiology

## 2013-07-16 ENCOUNTER — Encounter: Payer: Self-pay | Admitting: Interventional Cardiology

## 2013-07-16 VITALS — BP 120/80 | HR 60 | Ht 67.0 in | Wt 151.0 lb

## 2013-07-16 DIAGNOSIS — R011 Cardiac murmur, unspecified: Secondary | ICD-10-CM

## 2013-07-16 DIAGNOSIS — I10 Essential (primary) hypertension: Secondary | ICD-10-CM

## 2013-07-16 DIAGNOSIS — I4891 Unspecified atrial fibrillation: Secondary | ICD-10-CM

## 2013-07-16 MED ORDER — RIVAROXABAN 15 MG PO TABS: 15.0000 mg | ORAL_TABLET | Freq: Every day | ORAL | Status: DC

## 2013-07-16 NOTE — Patient Instructions (Signed)
Make sure you go ahead and start XARELTO 15 mg daily with dinner.   Your physician has requested that you have an echocardiogram. Echocardiography is a painless test that uses sound waves to create images of your heart. It provides your doctor with information about the size and shape of your heart and how well your heart's chambers and valves are working. This procedure takes approximately one hour. There are no restrictions for this procedure.  Your physician recommends that you return for lab work in: 1 MONTH on 08/14/13 for BMet and CBC.  Your physician wants you to follow-up in: 3 months with Dr. Irish Lack. You will receive a reminder letter in the mail two months in advance. If you don't receive a letter, please call our office to schedule the follow-up appointment.

## 2013-07-16 NOTE — Progress Notes (Signed)
Patient ID: Paula Contreras, female   DOB: September 27, 1933, 80 y.o.   MRN: [id number]     Patient ID: Paula Contreras MRN: [id number] DOB/AGE: 08-19-1933 80 y.o.   Referring Physician Dr. Alyson Ingles   Reason for Consultation AFib  HPI: 80 y/o who was chronic lung disease.  She is on home oxygen, 2L/min 24 hrs/day since 2012.  She saw Dr. Alyson Ingles for routine f/u and found AFib.  She was started on Xarelto, but did not start it.  She has HTN, but no diabetes.  No prior heart problems.    No chest pain or palpitations.  No dizziness, or syncope. Not much exercise due to chronic lung disease.     Current Outpatient Prescriptions  Medication Sig Dispense Refill  . alendronate (FOSAMAX) 70 MG tablet Take 70 mg by mouth every 7 (seven) days. Take with a full glass of water on an empty stomach.      Marland Kitchen amLODipine (NORVASC) 5 MG tablet Take 5 mg by mouth daily.      . Calcium Carbonate-Vitamin D (CALCIUM-VITAMIN D) 500-200 MG-UNIT per tablet Take 1 tablet by mouth daily.      . Cholecalciferol (VITAMIN D3) 2000 UNITS TABS Take by mouth.      . losartan (COZAAR) 100 MG tablet Take 100 mg by mouth daily. Will start when runs out of azor      . Rivaroxaban (XARELTO) 15 MG TABS tablet Take 15 mg by mouth daily.      Marland Kitchen Umeclidinium-Vilanterol (ANORO ELLIPTA) 62.5-25 MCG/INH AEPB Inhale 1 puff into the lungs daily.  1 each  6   No current facility-administered medications for this visit.   Past Medical History  Diagnosis Date  . Cataract   . Other psoriasis and similar disorders     psoriasis vs eczema  . Hypertension   . COPD (chronic obstructive pulmonary disease)   . Chronic kidney disease     Family History  Problem Relation Age of Onset  . Asthma Mother   . Asthma Maternal Grandmother   . Lung cancer Father   . Lung cancer Paternal Uncle     father's twin brother    History   Social History  . Marital Status: Unknown    Spouse Name: N/A    Number of Children: N/A  . Years of Education: N/A    Occupational History  . data entry specialist     retired   Social History Main Topics  . Smoking status: Former Smoker -- 1.00 packs/day for 61 years    Quit date: 09/25/2010  . Smokeless tobacco: Never Used  . Alcohol Use: No  . Drug Use: No  . Sexual Activity: Not on file   Other Topics Concern  . Not on file   Social History Narrative  . No narrative on file    Past Surgical History  Procedure Laterality Date  . Cataract extraction    . Breast surgery  1964  . Hip surgery      fracture      (Not in a hospital admission)  Review of systems complete and found to be negative unless listed above .  No nausea, vomiting.  No fever chills, No focal weakness,  No palpitations.  Physical Exam: Filed Vitals:   07/16/13 1529  BP: 120/80  Pulse: 60    Weight: 151 lb (68.493 kg)  Physical exam:  [place]/AT EOMI No JVD, No carotid bruit RRR S1S2  No wheezing Soft. NT, nondistended No edema. No focal motor  or sensory deficits Normal affect  Labs:   Lab Results  Component Value Date   WBC 6.1 09/28/2010   HGB 17.6* 09/28/2010   HCT 53.8* 09/28/2010   MCV 92.8 09/28/2010   PLT 111* 09/28/2010   No results found for this basename: NA, K, CL, CO2, BUN, CREATININE, CALCIUM, LABALBU, PROT, BILITOT, ALKPHOS, ALT, AST, GLUCOSE,  in the last 168 hours Lab Results  Component Value Date   CKTOTAL 77 09/25/2010   CKMB 4.3* 09/25/2010   TROPONINI <0.30 09/25/2010    No results found for this basename: CHOL   No results found for this basename: HDL   No results found for this basename: LDLCALC   No results found for this basename: TRIG   No results found for this basename: CHOLHDL   No results found for this basename: LDLDIRECT       EKG:  AFIB, rate controlled  ASSESSMENT AND PLAN:  1) AFib: Unsure of exact onset time. Heart rate is already controlled. She will need anticoagulation. We verified the dosage of Xarelto with the pharmacist. Check echocardiogram to evaluate  for structural heart disease.  2) HTN: Controlled. If her heart rate became more of an issue, could change amlodipine to diltiazem.  3) systolic murmur: May have some degree of aortic stenosis based on exam.  Signed:   Mina Marble, MD, [facility] 07/16/2013, 3:50 PM

## 2013-07-21 DIAGNOSIS — R011 Cardiac murmur, unspecified: Secondary | ICD-10-CM | POA: Insufficient documentation

## 2013-07-29 ENCOUNTER — Ambulatory Visit (HOSPITAL_COMMUNITY): Payer: Medicare Other | Attending: Internal Medicine | Admitting: Radiology

## 2013-07-29 ENCOUNTER — Encounter: Payer: Self-pay | Admitting: Internal Medicine

## 2013-07-29 DIAGNOSIS — I4891 Unspecified atrial fibrillation: Secondary | ICD-10-CM

## 2013-07-29 DIAGNOSIS — R011 Cardiac murmur, unspecified: Secondary | ICD-10-CM

## 2013-07-29 NOTE — Progress Notes (Signed)
Echocardiogram performed.  

## 2013-07-30 ENCOUNTER — Telehealth: Payer: Self-pay | Admitting: *Deleted

## 2013-07-30 NOTE — Telephone Encounter (Signed)
XARELTO approved through [employer] through 07/31/2014

## 2013-07-30 NOTE — Telephone Encounter (Signed)
PA to [employer] for xarelto

## 2013-08-14 ENCOUNTER — Other Ambulatory Visit (INDEPENDENT_AMBULATORY_CARE_PROVIDER_SITE_OTHER): Payer: Medicare Other

## 2013-08-14 DIAGNOSIS — I4891 Unspecified atrial fibrillation: Secondary | ICD-10-CM

## 2013-08-14 LAB — BASIC METABOLIC PANEL
BUN: 24 mg/dL — ABNORMAL HIGH (ref 6–23)
CALCIUM: 8.8 mg/dL (ref 8.4–10.5)
CO2: 27 mEq/L (ref 19–32)
Chloride: 104 mEq/L (ref 96–112)
Creatinine, Ser: 1.1 mg/dL (ref 0.4–1.2)
GFR: 51.6 mL/min — ABNORMAL LOW (ref >60.00)
GLUCOSE: 95 mg/dL (ref 70–99)
POTASSIUM: 4.4 meq/L (ref 3.5–5.1)
SODIUM: 139 meq/L (ref 135–145)

## 2013-08-14 LAB — CBC
HCT: 38 % (ref 36.0–46.0)
HEMOGLOBIN: 12.4 g/dL (ref 12.0–15.0)
MCHC: 32.6 g/dL (ref 30.0–36.0)
MCV: 88.2 fl (ref 78.0–100.0)
PLATELETS: 278 10*3/uL (ref 150.0–400.0)
RBC: 4.31 Mil/uL (ref 3.87–5.11)
RDW: 14.6 % (ref 11.5–15.5)
WBC: 7.6 10*3/uL (ref 4.0–10.5)

## 2013-08-18 ENCOUNTER — Telehealth: Payer: Self-pay | Admitting: Cardiology

## 2013-08-18 DIAGNOSIS — I4891 Unspecified atrial fibrillation: Secondary | ICD-10-CM

## 2013-08-18 MED ORDER — RIVAROXABAN 15 MG PO TABS: 15.0000 mg | ORAL_TABLET | Freq: Every day | ORAL | Status: DC

## 2013-08-18 NOTE — Telephone Encounter (Signed)
Pt notified. 6 month f/u labs scheduled.

## 2013-08-18 NOTE — Telephone Encounter (Signed)
Message copied by Alcario Drought on Tue Aug 18, 2013  2:42 PM ------      Message from: Jettie Booze      Created: Tue Aug 18, 2013  2:39 PM       Labs stable. Drink plenty of fluids.   Kidney function slightly decreased compared to prior. ------

## 2013-08-27 NOTE — Progress Notes (Signed)
Normal CBC, BMet

## 2013-09-15 ENCOUNTER — Ambulatory Visit (INDEPENDENT_AMBULATORY_CARE_PROVIDER_SITE_OTHER): Payer: Medicare Other | Admitting: Emergency Medicine

## 2013-09-15 ENCOUNTER — Encounter: Payer: Self-pay | Admitting: Emergency Medicine

## 2013-09-15 VITALS — BP 130/86 | HR 106 | Ht 67.0 in | Wt 151.0 lb

## 2013-09-15 DIAGNOSIS — Z23 Encounter for immunization: Secondary | ICD-10-CM

## 2013-09-15 DIAGNOSIS — J449 Chronic obstructive pulmonary disease, unspecified: Secondary | ICD-10-CM

## 2013-09-15 MED ORDER — ALBUTEROL SULFATE HFA 108 (90 BASE) MCG/ACT IN AERS: 2.0000 | INHALATION_SPRAY | Freq: Four times a day (QID) | RESPIRATORY_TRACT | Status: DC | PRN

## 2013-09-15 NOTE — Addendum Note (Signed)
Addended by: Carlos American A on: 09/15/2013 10:19 AM   Modules accepted: Orders

## 2013-09-15 NOTE — Patient Instructions (Signed)
Please restart your Anoro once a day Wear your oxygen at 2L/min at all times.  Please keep albuterol available to use 2 puffs if needed for shortness of breath We will give you the Prevnar-13 vaccine today Follow with Dr Lamonte Sakai in 4 months or sooner if you have any problems.

## 2013-09-15 NOTE — Addendum Note (Signed)
Addended by: Carlos American A on: 09/15/2013 10:20 AM   Modules accepted: Orders

## 2013-09-15 NOTE — Progress Notes (Signed)
Subjective:    Patient ID: Paula Contreras, female    DOB: 09-03-1933, 80 y.o.   MRN: [id number] HPI 80 yo longstanding smoker (~100 pk-yrs), never dx w COPD before but was admitted to [facility] 6/12. Found to be hypoxic, started on O2 and on combivent bid + pulmicort. Minimal exertional tolerance. Had been dealing w fatigue, malaise - may be a bit better since the hospitalization.    ROV 11/02/10 -- follow up for dyspnea and COPD, hypoxemia. We clarified her BD regimen - duonebs qid and pulmicort bid. She denies any dyspnea, she is less lethargic and sleepy than she was before. She denies any cough, wheeze.   ROV 03/12/11 -- COPD, hypoxemia. She remains on Duonebs and pulmicort, sometimes DuoNebs not qid. No hosp, or exacerbations. No wheeze or cough. Clinically stable. Her hearing is severely decreased.   ROV 09/20/11 -- COPD, hypoxemia. She remains on Duonebs and pulmicort, sometimes DuoNebs not qid. She needs to requalify for O2 today. Her breathing has been a little worse since she stopped her nebs.  Has not had any exacerbations or hospitalizations. She has not been using her nebulized meds for the last month - they were bothering her eye (she recently had sgy on the R eye).  She is not having any more cough, no wheezing. She does have clear PND.   ROV 04/04/12 -- follows up for her COPD. Denies any problems or flares.  Last time we stopped pulmicort, changed the DuoNebs to prn. She denies cough, but she is having a lot of clear PND.   ROV 10/16/12 -- hx COPD, follows up. She is on duonebs prn, albuterol prn. She has not needed or used any BD's since last time. No flares.  She now follows with Dr Alyson Ingles.   ROV 04/16/13 -- hx COPD, has been managed on prn BD's + O2, used to be on pulmicort. She has not had any flares or hospitalizations. She has SOB w exertion and doing chores. She never uses her duonebs. Discussed today that she is likely undertreated for her COPD.   ROV 06/16/13 -- COPD, returns for eval  after starting Anoro last visit. She reports today that she believes the Anoro helped > was able to do more walking, less dyspnea. She would like to continue it to see how she does.   ROV 09/15/13 -- follow up visit for COPD and hypoxemia. She has A fib, HTN.  She has been doing fairly well although has not tolerated the very hot weather. She is having SOB with bathing and getting ready in the am, going up and down stairs. Her O2 is set on 2L/min at all times.  She ran out of Anoro several days ago, but is about to refill it and restart it. No flares since last time. No hospitalizations. She was started on xarelto for her A Fib since last time. Rare cough, never wheezes. Discussed prevnar today.       Objective:   Physical Exam Filed Vitals:   09/15/13 0953  BP: 130/86  Pulse: 106  Height: 5\' 7"  (1.702 m)  Weight: 151 lb (68.493 kg)  SpO2: 94%   Gen: Pleasant, thin, in no distress,  normal affect, mild kyphosis  ENT: No lesions,  mouth clear,  oropharynx clear, no postnasal drip,  Neck: No JVD, no TMG, no carotid bruits  Lungs: distant no wheezing  Cardiovascular: RRR, heart sounds normal, no murmur or gallops, no peripheral edema  Musculoskeletal: No deformities, no cyanosis or clubbing  Neuro: alert, non focal  Skin: Warm, no lesions or rashes   Assessment & Plan:  COPD (chronic obstructive pulmonary disease) Appears to be stable. She believes she has benefited from [employer].   Please restart your Anoro once a day Wear your oxygen at 2L/min at all times.  Please keep albuterol available to use 2 puffs if needed for shortness of breath We will give you the Prevnar-13 vaccine today Follow with Dr Lamonte Sakai in 4 months or sooner if you have any problems

## 2013-09-15 NOTE — Assessment & Plan Note (Signed)
Appears to be stable. She believes she has benefited from [employer].   Please restart your Anoro once a day Wear your oxygen at 2L/min at all times.  Please keep albuterol available to use 2 puffs if needed for shortness of breath We will give you the Prevnar-13 vaccine today Follow with Dr Lamonte Sakai in 4 months or sooner if you have any problems

## 2013-10-26 ENCOUNTER — Ambulatory Visit: Payer: Medicare Other | Admitting: Interventional Cardiology

## 2013-10-29 ENCOUNTER — Encounter: Payer: Self-pay | Admitting: Interventional Cardiology

## 2013-10-29 ENCOUNTER — Ambulatory Visit (INDEPENDENT_AMBULATORY_CARE_PROVIDER_SITE_OTHER): Payer: Medicare Other | Admitting: Interventional Cardiology

## 2013-10-29 VITALS — BP 118/64 | HR 76 | Ht 67.0 in | Wt 155.8 lb

## 2013-10-29 DIAGNOSIS — I10 Essential (primary) hypertension: Secondary | ICD-10-CM

## 2013-10-29 DIAGNOSIS — I4891 Unspecified atrial fibrillation: Secondary | ICD-10-CM

## 2013-10-29 DIAGNOSIS — I359 Nonrheumatic aortic valve disorder, unspecified: Secondary | ICD-10-CM

## 2013-10-29 DIAGNOSIS — I482 Chronic atrial fibrillation, unspecified: Secondary | ICD-10-CM

## 2013-10-29 NOTE — Progress Notes (Signed)
Patient ID: Paula Contreras, female   DOB: Jun 03, 1933, 80 y.o.   MRN: [id number] Patient ID: Paula Contreras, female   DOB: [birth year], 80 y.o.   MRN: [id number]     Patient ID: Paula Contreras MRN: [id number] DOB/AGE: 09/15/1933 80 y.o.   Referring Physician Dr. Alyson Ingles   Reason for Consultation AFib  HPI: 80 y/o who was chronic lung disease.  She is on home oxygen, 2L/min 24 hrs/day since 2012.  She saw Dr. Alyson Ingles for routine f/u and found AFib in 4/15.  She was started on Xarelto.  She has HTN, but no diabetes.  No prior heart problems.    No chest pain or palpitations.  No dizziness, or syncope. Not much exercise due to chronic lung disease.    No bleeding issues.  Walking limited by joint pains and lung problems.   Current Outpatient Prescriptions  Medication Sig Dispense Refill  . albuterol (PROVENTIL HFA;VENTOLIN HFA) 108 (90 BASE) MCG/ACT inhaler Inhale 2 puffs into the lungs every 6 (six) hours as needed for wheezing or shortness of breath.  1 Inhaler  6  . alendronate (FOSAMAX) 70 MG tablet Take 70 mg by mouth every 7 (seven) days. Take with a full glass of water on an empty stomach.      Marland Kitchen amLODipine (NORVASC) 5 MG tablet Take 5 mg by mouth daily.      . Calcium Carbonate-Vitamin D (CALCIUM-VITAMIN D) 500-200 MG-UNIT per tablet Take 1 tablet by mouth daily.      . Cholecalciferol (VITAMIN D3) 2000 UNITS TABS Take by mouth.      . losartan (COZAAR) 100 MG tablet Take 100 mg by mouth daily. Will start when runs out of azor      . Rivaroxaban (XARELTO) 15 MG TABS tablet Take 1 tablet (15 mg total) by mouth daily.  30 tablet  6  . Umeclidinium-Vilanterol (ANORO ELLIPTA) 62.5-25 MCG/INH AEPB Inhale 1 puff into the lungs daily.  1 each  6   No current facility-administered medications for this visit.   Past Medical History  Diagnosis Date  . Cataract   . Other psoriasis and similar disorders     psoriasis vs eczema  . Hypertension   . COPD (chronic obstructive pulmonary disease)     . Chronic kidney disease     Family History  Problem Relation Age of Onset  . Asthma Mother   . Asthma Maternal Grandmother   . Lung cancer Father   . Lung cancer Paternal Uncle     father's twin brother    History   Social History  . Marital Status: Unknown    Spouse Name: N/A    Number of Children: N/A  . Years of Education: N/A   Occupational History  . data entry specialist     retired   Social History Main Topics  . Smoking status: Former Smoker -- 1.00 packs/day for 47 years    Quit date: 09/25/2010  . Smokeless tobacco: Never Used  . Alcohol Use: No  . Drug Use: No  . Sexual Activity: Not on file   Other Topics Concern  . Not on file   Social History Narrative  . No narrative on file    Past Surgical History  Procedure Laterality Date  . Cataract extraction    . Breast surgery  1964  . Hip surgery      fracture      (Not in a hospital admission)  Review of systems complete and found to be negative unless  listed above .  No nausea, vomiting.  No fever chills, No focal weakness,  No palpitations.  Physical Exam: Filed Vitals:   10/29/13 1613  BP: 118/64  Pulse: 76    Weight: 155 lb 12.8 oz (70.67 kg)  Physical exam:  /AT EOMI No JVD, No carotid bruit RRR S1S2  No wheezing Soft. NT, nondistended No edema. No focal motor or sensory deficits Normal affect  Labs:   Lab Results  Component Value Date   WBC 7.6 08/14/2013   HGB 12.4 08/14/2013   HCT 38.0 08/14/2013   MCV 88.2 08/14/2013   PLT 278.0 08/14/2013   No results found for this basename: NA, K, CL, CO2, BUN, CREATININE, CALCIUM, LABALBU, PROT, BILITOT, ALKPHOS, ALT, AST, GLUCOSE,  in the last 168 hours Lab Results  Component Value Date   CKTOTAL 77 09/25/2010   CKMB 4.3* 09/25/2010   TROPONINI <0.30 09/25/2010    No results found for this basename: CHOL   No results found for this basename: HDL   No results found for this basename: LDLCALC   No results found for this basename:  TRIG   No results found for this basename: CHOLHDL   No results found for this basename: LDLDIRECT       EKG:  AFIB, rate controlled  ASSESSMENT AND PLAN:  1) AFib: Unsure of exact onset time. Heart rate is already controlled. She is tolerating anticoagulation. Continue Xarelto with routine lab f/u. Checked echocardiogram to evaluate for structural heart disease.  See below.  Normal LV function.  2) HTN: Controlled. If her heart rate became more of an issue, could change amlodipine to diltiazem.  3) Aortic stenosis: moderate by echo in 2015.  No sx of severe AS.  Signed:   Mina Marble, MD, [facility] 10/29/2013, 4:40 PM

## 2013-10-29 NOTE — Patient Instructions (Signed)
Your physician recommends that you continue on your current medications as directed. Please refer to the Current Medication list given to you today.  Your physician wants you to follow-up in: 1 year with Dr. Varanasi. You will receive a reminder letter in the mail two months in advance. If you don't receive a letter, please call our office to schedule the follow-up appointment.  

## 2013-10-30 DIAGNOSIS — I359 Nonrheumatic aortic valve disorder, unspecified: Secondary | ICD-10-CM | POA: Insufficient documentation

## 2013-11-02 ENCOUNTER — Ambulatory Visit: Payer: Medicare Other | Admitting: Interventional Cardiology

## 2014-02-02 ENCOUNTER — Ambulatory Visit (INDEPENDENT_AMBULATORY_CARE_PROVIDER_SITE_OTHER): Payer: Medicare Other | Admitting: Emergency Medicine

## 2014-02-02 ENCOUNTER — Encounter: Payer: Self-pay | Admitting: Emergency Medicine

## 2014-02-02 VITALS — BP 100/62 | HR 101 | Temp 97.6°F | Ht 65.0 in | Wt 153.8 lb

## 2014-02-02 DIAGNOSIS — J449 Chronic obstructive pulmonary disease, unspecified: Secondary | ICD-10-CM

## 2014-02-02 NOTE — Progress Notes (Signed)
Subjective:    Patient ID: Paula Contreras, female    DOB: [birth year], 80 y.o.   MRN: [id number] HPI 80 yo longstanding smoker (~100 pk-yrs), never dx w COPD before but was admitted to [facility] 6/12. Found to be hypoxic, started on O2 and on combivent bid + pulmicort. Minimal exertional tolerance. Had been dealing w fatigue, malaise - may be a bit better since the hospitalization.    ROV 11/02/10 -- follow up for dyspnea and COPD, hypoxemia. We clarified her BD regimen - duonebs qid and pulmicort bid. She denies any dyspnea, she is less lethargic and sleepy than she was before. She denies any cough, wheeze.   ROV 03/12/11 -- COPD, hypoxemia. She remains on Duonebs and pulmicort, sometimes DuoNebs not qid. No hosp, or exacerbations. No wheeze or cough. Clinically stable. Her hearing is severely decreased.   ROV 09/20/11 -- COPD, hypoxemia. She remains on Duonebs and pulmicort, sometimes DuoNebs not qid. She needs to requalify for O2 today. Her breathing has been a little worse since she stopped her nebs.  Has not had any exacerbations or hospitalizations. She has not been using her nebulized meds for the last month - they were bothering her eye (she recently had sgy on the R eye).  She is not having any more cough, no wheezing. She does have clear PND.   ROV 04/04/12 -- follows up for her COPD. Denies any problems or flares.  Last time we stopped pulmicort, changed the DuoNebs to prn. She denies cough, but she is having a lot of clear PND.   ROV 10/16/12 -- hx COPD, follows up. She is on duonebs prn, albuterol prn. She has not needed or used any BD's since last time. No flares.  She now follows with Dr Alyson Ingles.   ROV 04/16/13 -- hx COPD, has been managed on prn BD's + O2, used to be on pulmicort. She has not had any flares or hospitalizations. She has SOB w exertion and doing chores. She never uses her duonebs. Discussed today that she is likely undertreated for her COPD.   ROV 06/16/13 -- COPD, returns for eval  after starting Anoro last visit. She reports today that she believes the Anoro helped > was able to do more walking, less dyspnea. She would like to continue it to see how she does.   ROV 09/15/13 -- follow up visit for COPD and hypoxemia. She has A fib, HTN.  She has been doing fairly well although has not tolerated the very hot weather. She is having SOB with bathing and getting ready in the am, going up and down stairs. Her O2 is set on 2L/min at all times.  She ran out of Anoro several days ago, but is about to refill it and restart it. No flares since last time. No hospitalizations. She was started on xarelto for her A Fib since last time. Rare cough, never wheezes. Discussed prevnar today.   ROV 02/02/14 -- follows for her COPD, hypoxemia.  She feels that she has been at her baseline - no flares. She has some throat irritation, appears to be chronic. She has never used her SABA, has never felt that she needed it. She got the prevnar w Dr Alyson Ingles in 10/15. We discussed restarting Anoro last time, but she did not do this - she is curently on no BD at all.       Objective:   Physical Exam Filed Vitals:   02/02/14 0937  BP: 100/62  Pulse: 101  Temp:  97.6 F (36.4 C)  TempSrc: Oral  Height: 5\' 5"  (1.651 m)  Weight: 153 lb 12.8 oz (69.763 kg)  SpO2: 92%   Gen: Pleasant, thin, in no distress,  normal affect, mild kyphosis  ENT: No lesions,  mouth clear,  oropharynx clear, no postnasal drip,  Neck: No JVD, no TMG, no carotid bruits  Lungs: distant no wheezing  Cardiovascular: RRR, heart sounds normal, no murmur or gallops, no peripheral edema  Musculoskeletal: No deformities, no cyanosis or clubbing  Neuro: alert, non focal  Skin: Warm, no lesions or rashes   Assessment & Plan:  COPD (chronic obstructive pulmonary disease) She is stable, biut also on no BD. Not clear why she did not restart ANORO. Also not clear that she has missed it. I would like to restart, follow to see if she  benefits. She had the Prevnar, shouldn't need any more PNA vaccination.

## 2014-02-02 NOTE — Assessment & Plan Note (Signed)
She is stable, biut also on no BD. Not clear why she did not restart ANORO. Also not clear that she has missed it. I would like to restart, follow to see if she benefits. She had the Prevnar, shouldn't need any more PNA vaccination.

## 2014-02-02 NOTE — Patient Instructions (Signed)
Please restart your Anoro once a day Use albuterol (ProAir) 2 puffs if needed for shortness of breath Wear your oxygen at 2L/min Follow with Dr Lamonte Sakai in 6 months or sooner if you have any problems

## 2014-02-15 ENCOUNTER — Other Ambulatory Visit: Payer: Medicare Other

## 2014-02-28 ENCOUNTER — Other Ambulatory Visit: Payer: Self-pay | Admitting: Interventional Cardiology

## 2014-04-05 ENCOUNTER — Other Ambulatory Visit: Payer: Self-pay | Admitting: Family Medicine

## 2014-04-05 ENCOUNTER — Ambulatory Visit
Admission: RE | Admit: 2014-04-05 | Discharge: 2014-04-05 | Disposition: A | Discharge status: Home / Self Care | Payer: Medicare Other | Source: Ambulatory Visit | Attending: Family Medicine | Admitting: Family Medicine

## 2014-04-05 DIAGNOSIS — M545 Low back pain, unspecified: Secondary | ICD-10-CM

## 2014-04-06 ENCOUNTER — Telehealth: Payer: Self-pay | Admitting: Nurse Practitioner

## 2014-04-06 NOTE — Telephone Encounter (Signed)
Called patient to schedule an appointment per Dr. Irish Lack who advised per patient's nephrologist, patient needs a soon appointment for evaluation of bradycardia.  Patient states she will have to talk with her neighbor to find out when she can get transportation to our office.  I advised patient to call back and ask for triage.  Dr. Hassell Done next available appointment is 1/20.  If patient is able, she should be scheduled to see an APP (flex or regular office schedule) this week.

## 2014-04-06 NOTE — Telephone Encounter (Signed)
Patient called back and Friday between 9:30 am and 1:00 pm would be only time she could get a ride. Patient has appointment scheduled 04/09/13

## 2014-04-09 ENCOUNTER — Ambulatory Visit (INDEPENDENT_AMBULATORY_CARE_PROVIDER_SITE_OTHER): Payer: Medicare Other | Admitting: Physician Assistant

## 2014-04-09 ENCOUNTER — Encounter: Payer: Self-pay | Admitting: Physician Assistant

## 2014-04-09 VITALS — BP 108/56 | HR 99 | Ht 65.0 in | Wt 148.0 lb

## 2014-04-09 DIAGNOSIS — N183 Chronic kidney disease, stage 3 unspecified: Secondary | ICD-10-CM

## 2014-04-09 DIAGNOSIS — I480 Paroxysmal atrial fibrillation: Secondary | ICD-10-CM

## 2014-04-09 DIAGNOSIS — R000 Tachycardia, unspecified: Secondary | ICD-10-CM

## 2014-04-09 DIAGNOSIS — I350 Nonrheumatic aortic (valve) stenosis: Secondary | ICD-10-CM | POA: Insufficient documentation

## 2014-04-09 DIAGNOSIS — J9611 Chronic respiratory failure with hypoxia: Secondary | ICD-10-CM

## 2014-04-09 DIAGNOSIS — I471 Supraventricular tachycardia: Secondary | ICD-10-CM

## 2014-04-09 DIAGNOSIS — I10 Essential (primary) hypertension: Secondary | ICD-10-CM

## 2014-04-09 DIAGNOSIS — R001 Bradycardia, unspecified: Secondary | ICD-10-CM | POA: Insufficient documentation

## 2014-04-09 DIAGNOSIS — J9610 Chronic respiratory failure, unspecified whether with hypoxia or hypercapnia: Secondary | ICD-10-CM | POA: Insufficient documentation

## 2014-04-09 DIAGNOSIS — J449 Chronic obstructive pulmonary disease, unspecified: Secondary | ICD-10-CM

## 2014-04-09 LAB — BASIC METABOLIC PANEL
BUN: 18 mg/dL (ref 6–23)
CHLORIDE: 103 meq/L (ref 96–112)
CO2: 29 meq/L (ref 19–32)
CREATININE: 1.4 mg/dL — AB (ref 0.4–1.2)
Calcium: 9.1 mg/dL (ref 8.4–10.5)
GFR: 39.23 mL/min — AB (ref >60.00)
GLUCOSE: 104 mg/dL — AB (ref 70–99)
Potassium: 4.7 mEq/L (ref 3.5–5.1)
SODIUM: 141 meq/L (ref 135–145)

## 2014-04-09 LAB — CBC
HCT: 32.1 % — ABNORMAL LOW (ref 36.0–46.0)
Hemoglobin: 10.3 g/dL — ABNORMAL LOW (ref 12.0–15.0)
MCHC: 32 g/dL (ref 30.0–36.0)
MCV: 92.1 fl (ref 78.0–100.0)
PLATELETS: 332 10*3/uL (ref 150.0–400.0)
RBC: 3.49 Mil/uL — AB (ref 3.87–5.11)
RDW: 17.4 % — ABNORMAL HIGH (ref 11.5–15.5)
WBC: 7.9 10*3/uL (ref 4.0–10.5)

## 2014-04-09 LAB — TSH: TSH: 1.39 u[IU]/mL (ref 0.35–4.50)

## 2014-04-09 NOTE — Addendum Note (Signed)
Addended by: Charlie Pitter on: 04/09/2014 10:35 AM   Modules accepted: Level of Service

## 2014-04-09 NOTE — Progress Notes (Addendum)
[place], [place], [place]  [address] Phone: [phone] Fax:  [phone]  Date:  04/09/2014   Patient ID:  Paula Contreras, DOB 03/05/1934, MRN [id number]   PCP:  Vena Austria, MD  Cardiologist: Irish Lack   History of Present Illness: Paula Contreras is a 81 y.o. female with history of atrial fibrillation, HTN, COPD with chronic respiratory failure on home O2 since 2012, CKD stage III, and moderate AS by echo 07/2013. Per notes she was diagnosed with AF by PCP in 07/2013 and seen by Dr. Irish Lack at that time. She was rate controlled and put on Xarelto. In follow-up 09/2013, Dr. Hassell Done office note states an EKG showed Afib rate controlled, and exam revealed RRR. I do not have access to any EKGs since the initial diagnosis. I do not see a history of DCCV. She is in NSR today.  She was seen at [facility] 03/30/14 for AKI on CKD. Per notes: Baseline Cr 1.2-1.3 up until 07/2013. Cr was 1.3 on 12/2013 and worsened to Cr 2.0 two weeks later. Losartan was stopped and amlodipine was increased. F/u Cr 02/13/14 was unchanged. At the office visit on 03/30/14 her HR was noted to be 44 with irregular rate and rhythm per physical exam notes. There is no EKG available from that visit. Prior HR at PCP visits noted to be 70-80. It was 101 at pulm OV 01/2014. Nephrology was concerned that bradycardia was worsening her kidney function. She was referred to our office for consideration of event monitor. She was not previously on any AVN blocking agents and continues to remain off of them. Prior Hgb reported at 11.8.  HR is in the 90s-low 100s today - NSR/borderline sinus tach. She has no acute cardiac complaints. She denies any recent CP, nausea, dizziness, syncope, pre-syncope, LEE orthopnea, or weakness. She has chronic unchanged intermittent DOE related to her lung disease without any recent changes. She does think that on the day of her kidney visit, she felt more tired than  usual.  Recent Labs: 08/14/2013: Creatinine 1.1; Hemoglobin 12.4; Potassium 4.4  Wt Readings from Last 3 Encounters:  04/09/14 148 lb (67.132 kg)  02/02/14 153 lb 12.8 oz (69.763 kg)  10/29/13 155 lb 12.8 oz (70.67 kg)     Past Medical History  Diagnosis Date  . Cataract   . Other psoriasis and similar disorders     psoriasis vs eczema  . Hypertension   . COPD (chronic obstructive pulmonary disease)   . CKD (chronic kidney disease), stage III   . Persistent atrial fibrillation     a. Dx 07/2013. No attempts per Epic to restore NSR.  Marland Kitchen Aortic stenosis     a. 2D Echo 07/2013: EF 60-65%, no RWMA, grade 1 DD, normal LV filling pressure, moderate AS, mild TR, PASP 12mmHg.  Marland Kitchen Chronic respiratory failure     a. On home O2 since 2012.    Current Outpatient Prescriptions  Medication Sig Dispense Refill  . albuterol (PROVENTIL HFA;VENTOLIN HFA) 108 (90 BASE) MCG/ACT inhaler Inhale 2 puffs into the lungs every 6 (six) hours as needed for wheezing or shortness of breath. 1 Inhaler 6  . alendronate (FOSAMAX) 70 MG tablet Take 70 mg by mouth every 7 (seven) days. Take with a full glass of water on an empty stomach.    Marland Kitchen amLODipine (NORVASC) 10 MG tablet Take 10 mg by mouth daily.     . pravastatin (PRAVACHOL) 40 MG tablet Take 40 mg by mouth daily.    Marland Kitchen  XARELTO 15 MG TABS tablet TAKE 1 TABLET (15 MG TOTAL) BY MOUTH DAILY. 30 tablet 6   No current facility-administered medications for this visit.    Allergies:   Codeine   Social History:  The patient  reports that she quit smoking about 3 years ago. She has never used smokeless tobacco. She reports that she does not drink alcohol or use illicit drugs.   Family History:  The patient's family history includes Asthma in her maternal grandmother and mother; Lung cancer in her father and paternal uncle.   ROS:  Please see the history of present illness.   All other systems reviewed and negative.   PHYSICAL EXAM: VS:  BP 108/56 mmHg  Pulse 99   Ht 5\' 5"  (1.651 m)  Wt 148 lb (67.132 kg)  BMI 24.63 kg/m2  SpO2 98% Well nourished, well developed WF in no acute distress HEENT: normal Neck: no JVD Cardiac:  normal S1, S2; RRR borderline tachycardic; no murmur Lungs:  Diminished throughout, no wheezing, rhonchi or rales. On O2. Abd: soft, nontender, no hepatomegaly Ext: no edema Skin: warm and dry Neuro:  moves all extremities spontaneously, no focal abnormalities noted  EKG:  Sinus tach 101bpm with PAC, no acute ST-T changes   ASSESSMENT AND PLAN:  1. Possible bradycardia - EKG unavailable from 03/30/14 nephrology visit, reportedly irregular at 44bpm. She is in borderline sinus tach today but is asymptomatic. HR was also 101 at 01/2014 OV with pulmonology. Checking labs today. Will also place 21-day event monitor to evaluate for heart rate excursion, recurrence of AF, and bradycardia/tachycardia. She is not on any AVN blocking agents. 2. Sinus tachycardia - this was present at 01/2014 OV with pulmonology. She is asymptomatic. Check CBC, BMET, TSH today to exclude significant abnormality. She is not hypoxic, tachypnic or dyspneic today. 3. Paroxysmal atrial fibrillation - in NSR today. Continue Xarelto. If renal function progressively declines we might need to consider changing to Coumadin. 4. Moderate AS by echo 07/2013 - will defer timing of f/u echocardiogram to primary cardiologist. She is asymptomatic. 5. Essential HTN - controlled. 6. CKD stage III - followed by nephrology.  7. COPD with chronic respiratory failure - followed by pulmonology.  Dispo: F/u Dr. Irish Lack 6 weeks to regroup.  Signed, Melina Copa, PA-C  04/09/2014 10:17 AM

## 2014-04-09 NOTE — Patient Instructions (Addendum)
Your physician recommends that you continue on your current medications as directed. Please refer to the Current Medication list given to you today.    LABS TODAY  BMET TSH CBC     FOLL OW UP WITH DR Irish Lack IN 6 TO  WEEKS   Your physician has recommended that you wear an event monitor 21 DAYS . Event monitors are medical devices that record the heart's electrical activity. Doctors most often [place] these monitors to diagnose arrhythmias. Arrhythmias are problems with the speed or rhythm of the heartbeat. The monitor is a small, portable device. You can wear one while you do your normal daily activities. This is usually used to diagnose what is causing palpitations/syncope (passing out).

## 2014-04-12 ENCOUNTER — Encounter (INDEPENDENT_AMBULATORY_CARE_PROVIDER_SITE_OTHER): Payer: Medicare Other

## 2014-04-12 ENCOUNTER — Encounter: Payer: Self-pay | Admitting: *Deleted

## 2014-04-12 DIAGNOSIS — I471 Supraventricular tachycardia: Secondary | ICD-10-CM

## 2014-04-12 DIAGNOSIS — R000 Tachycardia, unspecified: Secondary | ICD-10-CM

## 2014-04-12 DIAGNOSIS — I480 Paroxysmal atrial fibrillation: Secondary | ICD-10-CM

## 2014-04-12 NOTE — Progress Notes (Signed)
Patient ID: Paula Contreras, female   DOB: 1933-06-06, 81 y.o.   MRN: [id number] [employer] 30 day cardiac event monitor applied to patient.

## 2014-04-21 ENCOUNTER — Ambulatory Visit: Payer: Self-pay | Admitting: Interventional Cardiology

## 2014-05-05 ENCOUNTER — Other Ambulatory Visit: Payer: Self-pay | Admitting: Interventional Cardiology

## 2014-06-04 ENCOUNTER — Ambulatory Visit: Payer: Medicare Other | Admitting: Interventional Cardiology

## 2014-07-13 ENCOUNTER — Ambulatory Visit (INDEPENDENT_AMBULATORY_CARE_PROVIDER_SITE_OTHER): Payer: Medicare Other | Admitting: Interventional Cardiology

## 2014-07-13 ENCOUNTER — Encounter: Payer: Self-pay | Admitting: Interventional Cardiology

## 2014-07-13 VITALS — BP 160/92 | HR 103 | Ht 65.0 in | Wt 135.4 lb

## 2014-07-13 DIAGNOSIS — I10 Essential (primary) hypertension: Secondary | ICD-10-CM | POA: Diagnosis not present

## 2014-07-13 DIAGNOSIS — N183 Chronic kidney disease, stage 3 unspecified: Secondary | ICD-10-CM

## 2014-07-13 DIAGNOSIS — I480 Paroxysmal atrial fibrillation: Secondary | ICD-10-CM | POA: Diagnosis not present

## 2014-07-13 DIAGNOSIS — N189 Chronic kidney disease, unspecified: Secondary | ICD-10-CM

## 2014-07-13 DIAGNOSIS — I359 Nonrheumatic aortic valve disorder, unspecified: Secondary | ICD-10-CM

## 2014-07-13 NOTE — Progress Notes (Signed)
Patient ID: Paula Contreras, female   DOB: October 26, 1933, 81 y.o.   MRN: [id number]     Cardiology Office Note   Date:  07/13/2014   ID:  Paula Contreras, DOB February 18, 1934, MRN [id number]  PCP:  Vena Austria, MD    Chief Complaint  Patient presents with  . Labs Only     Wt Readings from Last 3 Encounters:  07/13/14 135 lb 6.4 oz (61.417 kg)  04/09/14 148 lb (67.132 kg)  02/02/14 153 lb 12.8 oz (69.763 kg)       History of Present Illness: Paula Contreras is a 81 y.o. female  with history of atrial fibrillation, HTN, COPD with chronic respiratory failure on home O2 since 2012, CKD stage III, and moderate AS by echo 07/2013. She was diagnosed with AF by PCP in 07/2013. She was rate controlled and put on Xarelto.  She is now staying in an assisted living facility. She denies any palpitations. She has some mild dyspnea on exertion but she does not do much walking for exercise.  She is not checking her blood pressure outside of the doctor's office.    Past Medical History  Diagnosis Date  . Cataract   . Other psoriasis and similar disorders     psoriasis vs eczema  . Hypertension   . COPD (chronic obstructive pulmonary disease)   . CKD (chronic kidney disease), stage III   . Persistent atrial fibrillation     a. Dx 07/2013. No attempts per Epic to restore NSR.  Marland Kitchen Aortic stenosis     a. 2D Echo 07/2013: EF 60-65%, no RWMA, grade 1 DD, normal LV filling pressure, moderate AS, mild TR, PASP 87mmHg.  Marland Kitchen Chronic respiratory failure     a. On home O2 since 2012.    Past Surgical History  Procedure Laterality Date  . Cataract extraction    . Breast surgery  1964  . Hip surgery      fracture     Current Outpatient Prescriptions  Medication Sig Dispense Refill  . acetaminophen (TYLENOL) 325 MG tablet Take 650 mg by mouth every 6 (six) hours as needed.    Marland Kitchen alendronate (FOSAMAX) 70 MG tablet Take 70 mg by mouth every 7 (seven) days. Take with a full glass of water on an empty  stomach.    . calcitRIOL (ROCALTROL) 0.25 MCG capsule Take 0.25 mcg by mouth daily.    . calcium carbonate (OS-CAL) 600 MG TABS tablet Take 2 tablets by mouth daily.    . Cholecalciferol (VITAMIN D) 2000 UNITS CAPS Take 2,000 Units by mouth daily.    [name] 20 MG TABS tablet Take 20 mg by mouth daily.     No current facility-administered medications for this visit.    Allergies:   Codeine    Social History:  The patient  reports that she quit smoking about 3 years ago. She has never used smokeless tobacco. She reports that she does not drink alcohol or use illicit drugs.   Family History:  The patient's *family history includes Asthma in her maternal grandmother and mother; Lung cancer in her father and paternal uncle.    ROS:  Please see the history of present illness.   Otherwise, review of systems are positive for shortness of breath with exertion*.   All other systems are reviewed and negative.    PHYSICAL EXAM: VS:  BP 160/92 mmHg  Pulse 103  Ht 5\' 5"  (1.651 m)  Wt 135 lb 6.4 oz (61.417 kg)  BMI  22.53 kg/m2 , BMI Body mass index is 22.53 kg/(m^2). GEN: Well nourished, well developed, in no acute distress HEENT: normal Neck: no JVD, carotid bruits, or masses Cardiac: Irregularly irregular; no murmurs, rubs, or gallops,no edema  Respiratory:  clear to auscultation bilaterally, normal work of breathing GI: soft, nontender, nondistended, + BS MS: no deformity or atrophy Skin: warm and dry, no rash Neuro:  Strength and sensation are intact Psych: euthymic mood, full affect   Recent Labs: 04/09/2014: BUN 18; Creatinine 1.4*; Hemoglobin 10.3*; Platelets 332.0; Potassium 4.7; Sodium 141; TSH 1.39   Lipid Panel No results found for: CHOL, TRIG, HDL, CHOLHDL, VLDL, LDLCALC, LDLDIRECT   Other studies Reviewed: Additional studies/ records that were reviewed today with results demonstrating: Prior echo as noted above.   ASSESSMENT AND PLAN:  1. Atrial fibrillation: My exam,  she is in atrial fibrillation today. She is not having any symptoms of palpitations. She has had issues with bradycardia in the past. Will not add any further medications at this time.  2. Chronic kidney disease: We'll check electrolytes and kidney function.  Dosing of her Xarelto will be based partially on her creatinine. 3.  Hypertension: All of her antihypertensives were stopped by her nephrologist due to hypotension. I've asked her to check her blood pressures outside of the doctor's office. Her blood pressure is high today but I'm hesitant to start any antihypertensives at this time. Would likely avoid any AV nodal blocking agents due to her prior bradycardia, although today, she is not bradycardic.  4.  Aortic stenosis: No symptoms of severe aortic stenosis mentioned. I think her shortness of breath is more from deconditioning.    Current medicines are reviewed at length with the patient today.  The patient concerns regarding her medicines were addressed.  The following changes have been made:  No change  Labs/ tests ordered today include:  Orders Placed This Encounter  Procedures  . Basic Metabolic Panel (BMET)    Recommend 150 minutes/week of aerobic exercise Low fat, low carb, high fiber diet recommended  Disposition:   FU in 9 months   Teresita Madura., MD  07/13/2014 5:45 [facility] [address], [place], [place]  [address] Phone: [phone]; Fax: [phone]

## 2014-07-13 NOTE — Patient Instructions (Signed)
Medication Instructions:  Your physician recommends that you continue on your current medications as directed. Please refer to the Current Medication list given to you today.   Labwork: BMET TODAY  Testing/Procedures: NONE  Follow-Up: 9 MONTHS WITH DR. VARANASI  Any Other Special Instructions Will Be Listed Below (If Applicable).

## 2014-07-14 ENCOUNTER — Encounter: Payer: Self-pay | Admitting: Podiatry

## 2014-07-14 ENCOUNTER — Ambulatory Visit (INDEPENDENT_AMBULATORY_CARE_PROVIDER_SITE_OTHER): Payer: Medicare Other | Admitting: Podiatry

## 2014-07-14 DIAGNOSIS — B351 Tinea unguium: Secondary | ICD-10-CM

## 2014-07-14 DIAGNOSIS — M79676 Pain in unspecified toe(s): Secondary | ICD-10-CM

## 2014-07-14 DIAGNOSIS — Q828 Other specified congenital malformations of skin: Secondary | ICD-10-CM | POA: Diagnosis not present

## 2014-07-14 LAB — BASIC METABOLIC PANEL
BUN: 20 mg/dL (ref 6–23)
CO2: 41 mEq/L — ABNORMAL HIGH (ref 19–32)
CREATININE: 1.54 mg/dL — AB (ref 0.40–1.20)
Calcium: 10.8 mg/dL — ABNORMAL HIGH (ref 8.4–10.5)
Chloride: 98 mEq/L (ref 96–112)
GFR: 34.54 mL/min — AB (ref >60.00)
Glucose, Bld: 104 mg/dL — ABNORMAL HIGH (ref 70–99)
POTASSIUM: 4.1 meq/L (ref 3.5–5.1)
Sodium: 144 mEq/L (ref 135–145)

## 2014-07-14 NOTE — Progress Notes (Signed)
   Subjective:    Patient ID: Paula Contreras, female    DOB: April 26, 1933, 81 y.o.   MRN: [id number]  HPI N- SORE, THICK SKIN L-RT 1ST MET. LT FOOT MEDIAL SIDE OF THE HEEL D-LONG-TERM O-SLOWLY C-WORSE, THICKER A-PRESSURE T-NONE  ALSO, PT REQUESTING FOR TOENAILS DEBRIDEMENT. No prior history of podiatric care assisted-living patient  Review of Systems  HENT: Positive for hearing loss.   Eyes: Positive for visual disturbance.  Respiratory: Positive for cough, chest tightness and shortness of breath.   Cardiovascular: Positive for leg swelling.  Genitourinary: Positive for frequency.  Musculoskeletal: Positive for joint swelling and gait problem.  Hematological: Bruises/bleeds easily.  All other systems reviewed and are negative.      Objective:   Physical Exam Hard of  hearing patient appears orientated 3.  Patient's friend and treatment room today Nasal oxygen  Vascular: DP pulses 2/4 bilaterally PT pulses 1/4 bilaterally Mild pitting edema  Bilaterally  Neurological: Ankle reflexes equal and reactive bilaterally Sensation to 10 g monofilament wire intact 5/5 bilaterally  Dermatological: The toenails are elongated, brittle, incurvated, discolored and tender direct palpation 6-10 Large plantar keratoses sub-first MPJ right Nucleated plantar keratoses left heel  Musculoskeletal: There is no restriction ankle, subtalar, midtarsal joints bilaterally       Assessment & Plan:   Assessment: Symptomatic onychomycoses 6-10 Keratoses plantar right first MPJ Porokeratosis left heel  Plan: Debridement toenails 10 Bleeding distal second left toe treated  topical antibiotic ointment dressing applied Letter keratoses 2 debrided without any bleeding  Advised in after visit summary and written note to apply topical antibiotic ointment to the second left toe after removing bandage in 24-48 hours daily until a scab forms  Reappoint 3 months

## 2014-07-14 NOTE — Patient Instructions (Signed)
Removed bandage on second left toe 24-48 hours Apply topical antibiotic ointment to the second left toe daily and cover with Band-Aid until a scab forms

## 2014-08-31 ENCOUNTER — Encounter: Payer: Self-pay | Admitting: Emergency Medicine

## 2014-08-31 ENCOUNTER — Ambulatory Visit (INDEPENDENT_AMBULATORY_CARE_PROVIDER_SITE_OTHER): Payer: Medicare Other | Admitting: Emergency Medicine

## 2014-08-31 VITALS — BP 158/86 | HR 93 | Ht 67.0 in | Wt 130.0 lb

## 2014-08-31 DIAGNOSIS — J9611 Chronic respiratory failure with hypoxia: Secondary | ICD-10-CM

## 2014-08-31 MED ORDER — TIOTROPIUM BROMIDE MONOHYDRATE 1.25 MCG/ACT IN AERS: 2.0000 | INHALATION_SPRAY | Freq: Every day | RESPIRATORY_TRACT | Status: DC

## 2014-08-31 NOTE — Progress Notes (Signed)
Subjective:    Patient ID: Paula Contreras, female    DOB: 1933-11-03, 81 y.o.   MRN: [id number] HPI 81 yo longstanding smoker (~100 pk-yrs), never dx w COPD before but was admitted to [facility] 6/12. Found to be hypoxic, started on O2 and on combivent bid + pulmicort. Minimal exertional tolerance. Had been dealing w fatigue, malaise - may be a bit better since the hospitalization.    ROV 11/02/10 -- follow up for dyspnea and COPD, hypoxemia. We clarified her BD regimen - duonebs qid and pulmicort bid. She denies any dyspnea, she is less lethargic and sleepy than she was before. She denies any cough, wheeze.   ROV 03/12/11 -- COPD, hypoxemia. She remains on Duonebs and pulmicort, sometimes DuoNebs not qid. No hosp, or exacerbations. No wheeze or cough. Clinically stable. Her hearing is severely decreased.   ROV 09/20/11 -- COPD, hypoxemia. She remains on Duonebs and pulmicort, sometimes DuoNebs not qid. She needs to requalify for O2 today. Her breathing has been a little worse since she stopped her nebs.  Has not had any exacerbations or hospitalizations. She has not been using her nebulized meds for the last month - they were bothering her eye (she recently had sgy on the R eye).  She is not having any more cough, no wheezing. She does have clear PND.   ROV 04/04/12 -- follows up for her COPD. Denies any problems or flares.  Last time we stopped pulmicort, changed the DuoNebs to prn. She denies cough, but she is having a lot of clear PND.   ROV 10/16/12 -- hx COPD, follows up. She is on duonebs prn, albuterol prn. She has not needed or used any BD's since last time. No flares.  She now follows with Dr Alyson Ingles.   ROV 04/16/13 -- hx COPD, has been managed on prn BD's + O2, used to be on pulmicort. She has not had any flares or hospitalizations. She has SOB w exertion and doing chores. She never uses her duonebs. Discussed today that she is likely undertreated for her COPD.   ROV 06/16/13 -- COPD, returns for eval  after starting Anoro last visit. She reports today that she believes the Anoro helped > was able to do more walking, less dyspnea. She would like to continue it to see how she does.   ROV 09/15/13 -- follow up visit for COPD and hypoxemia. She has A fib, HTN.  She has been doing fairly well although has not tolerated the very hot weather. She is having SOB with bathing and getting ready in the am, going up and down stairs. Her O2 is set on 2L/min at all times.  She ran out of Anoro several days ago, but is about to refill it and restart it. No flares since last time. No hospitalizations. She was started on xarelto for her A Fib since last time. Rare cough, never wheezes. Discussed prevnar today.   ROV 02/02/14 -- follows for her COPD, hypoxemia.  She feels that she has been at her baseline - no flares. She has some throat irritation, appears to be chronic. She has never used her SABA, has never felt that she needed it. She got the prevnar w Dr Alyson Ingles in 10/15. We discussed restarting Anoro last time, but she did not do this - she is curently on no BD at all.   ROV 08/31/14 -- follow-up visit for chronic dyspnea and chronic hypoxemic respiratory failure in the setting of COPD, atrial fibrillation, moderate aortic stenosis,  hypertension, chronic renal insufficiency. Last time she had come off of all of her bronchodilators and I asked her to restart Anoro, but she is not on it. History today is taken from the patient and her friend Inez Catalina. She is wearing oxygen. She states that her breathing has ups and downs, has been doing fairly well. By contrast her friend Inez Catalina tells me that she gets significantly dyspneic with just walking back and forth to the restroom. She has albuterol but never uses it.   I reviewed her labs from 07/13/14, show a mild metabolic alkalosis and a serum creatinine of 1.54, GFR 34.5.  She hasn't had a chest x-ray in our system since 2012.       Objective:   Physical Exam Filed Vitals:    08/31/14 1013 08/31/14 1021  BP: 158/86   Pulse: 93   Height: '5\' 7"'$  (1.702 m)   Weight: 130 lb (58.968 kg)   SpO2: 91% 91%   Gen: Pleasant, thin, in no distress,  normal affect, mild kyphosis  ENT: No lesions,  mouth clear,  oropharynx clear, no postnasal drip,  Neck: No JVD, no TMG, no carotid bruits  Lungs: distant no wheezing  Cardiovascular: RRR, heart sounds normal, no murmur or gallops, no peripheral edema  Musculoskeletal: No deformities, no cyanosis or clubbing  Neuro: alert, non focal  Skin: Warm, no lesions or rashes   Assessment & Plan:  COPD (chronic obstructive pulmonary disease) With continued significant dyspnea and hypoxemia. She is still not on a scheduled bronchodilator and I believe part of the problem has been some degree of memory loss and inability to manage her medicines appropriately. She is now in an assisted living/nursing home situation and I believe that her medications can be given more reliably. We will try to start Spiriva respimat to see if she benefits. Follow-up with her in 3 months to assess   Chronic respiratory failure With hypoxemia. She is desaturated with ambulation on pulse oxygen today. We will increase her to 3 L/m whenever she uses her pulsed system. She can remain on 2 L/m when she is on her home concentrator.

## 2014-08-31 NOTE — Assessment & Plan Note (Signed)
With hypoxemia. She is desaturated with ambulation on pulse oxygen today. We will increase her to 3 L/m whenever she uses her pulsed system. She can remain on 2 L/m when she is on her home concentrator.

## 2014-08-31 NOTE — Patient Instructions (Signed)
We will try starting Spiriva respimat, 2 inhalations once a day to see if you benefit Please continue to keep pro-air available to use if needed for shortness of breath User oxygen at 2 L/m when you are on her continuous concentrator. Turn this up to 3 L/m when you use your portable pulsed oxygen Follow with Dr Lamonte Sakai in 3 months or sooner if you have any problems.

## 2014-08-31 NOTE — Assessment & Plan Note (Signed)
With continued significant dyspnea and hypoxemia. She is still not on a scheduled bronchodilator and I believe part of the problem has been some degree of memory loss and inability to manage her medicines appropriately. She is now in an assisted living/nursing home situation and I believe that her medications can be given more reliably. We will try to start Spiriva respimat to see if she benefits. Follow-up with her in 3 months to assess

## 2014-10-20 ENCOUNTER — Encounter: Payer: Self-pay | Admitting: Podiatry

## 2014-10-20 ENCOUNTER — Ambulatory Visit (INDEPENDENT_AMBULATORY_CARE_PROVIDER_SITE_OTHER): Payer: Medicare Other | Admitting: Podiatry

## 2014-10-20 DIAGNOSIS — B351 Tinea unguium: Secondary | ICD-10-CM

## 2014-10-20 DIAGNOSIS — M79676 Pain in unspecified toe(s): Secondary | ICD-10-CM

## 2014-10-20 DIAGNOSIS — L84 Corns and callosities: Secondary | ICD-10-CM | POA: Diagnosis not present

## 2014-10-20 DIAGNOSIS — Q828 Other specified congenital malformations of skin: Secondary | ICD-10-CM

## 2014-10-21 NOTE — Progress Notes (Signed)
Patient ID: Paula Contreras, female   DOB: [birth year], 81 y.o.   MRN: [id number]  Subjective: This patient presents today with her friend present in the room complaining of painful toenails and keratoses and request skin a nail debridement  Objective: The toenails are elongated, brittle, hypertrophic, incurvated and tender to direct palpation 6-10 Large well-organized somewhat nucleated keratoses plantar first MPJ right Nucleated keratoses left heel  Assessment: Symptomatic onychomycoses 6-10 porokeratosis 1 Keratoses 1  Plan: Debridement of toenails mechanically electrocute without any bleeding debrided porokeratosis and keratoses without any bleeding  Reappoint 3 months

## 2014-12-01 ENCOUNTER — Ambulatory Visit (INDEPENDENT_AMBULATORY_CARE_PROVIDER_SITE_OTHER): Payer: Medicare Other | Admitting: Emergency Medicine

## 2014-12-01 ENCOUNTER — Encounter: Payer: Self-pay | Admitting: Emergency Medicine

## 2014-12-01 VITALS — BP 120/74 | HR 90 | Ht 67.0 in | Wt 134.0 lb

## 2014-12-01 DIAGNOSIS — J449 Chronic obstructive pulmonary disease, unspecified: Secondary | ICD-10-CM | POA: Diagnosis not present

## 2014-12-01 NOTE — Patient Instructions (Signed)
Please continue your oxygen at 2 L/m when using your concentrator and at 3 L/m when using your portable system Please continue Spiriva 2 puffs once a day. We will review the technique for inhaling this medication today. You need to inhale the mist created by the inhaler deep into your chest and hold it for 5-10 seconds. Try to avoid just holding the medication in your mouth.  I recommend that you get the flu shot this fall Follow with Dr Lamonte Sakai in 6 months or sooner if you have any problems

## 2014-12-01 NOTE — Assessment & Plan Note (Signed)
She appears to be overall stable infection may be somewhat improved with the increase in her oxygen. It still unclear to me that she is taking the Spiriva correctly. She tells me that she is holding the medication in her mouth for 10 seconds, apparently without inhaling it. I have asked her to make sure that she inhales the medication deeply into her lungs and holds it there. We will work on teaching her again today and will try to pass this information along to her assisted living facility. Other instructions as below  Please continue your oxygen at 2 L/m when using your concentrator and at 3 L/m when using your portable system Please continue Spiriva 2 puffs once a day. We will review the technique for inhaling this medication today. You need to inhale the mist created by the inhaler deep into your chest and hold it for 5-10 seconds. Try to avoid just holding the medication in your mouth.  I recommend that you get the flu shot this fall Follow with Dr Lamonte Sakai in 6 months or sooner if you have any problems

## 2014-12-01 NOTE — Progress Notes (Signed)
Subjective:    Patient ID: Paula Contreras, female    DOB: [birth year], 81 y.o.   MRN: [id number] HPI 81 yo longstanding smoker (~100 pk-yrs), never dx w COPD before but was admitted to [facility] 6/12. Found to be hypoxic, started on O2 and on combivent bid + pulmicort. Minimal exertional tolerance. Had been dealing w fatigue, malaise - may be a bit better since the hospitalization.    ROV 11/02/10 -- follow up for dyspnea and COPD, hypoxemia. We clarified her BD regimen - duonebs qid and pulmicort bid. She denies any dyspnea, she is less lethargic and sleepy than she was before. She denies any cough, wheeze.   ROV 03/12/11 -- COPD, hypoxemia. She remains on Duonebs and pulmicort, sometimes DuoNebs not qid. No hosp, or exacerbations. No wheeze or cough. Clinically stable. Her hearing is severely decreased.   ROV 09/20/11 -- COPD, hypoxemia. She remains on Duonebs and pulmicort, sometimes DuoNebs not qid. She needs to requalify for O2 today. Her breathing has been a little worse since she stopped her nebs.  Has not had any exacerbations or hospitalizations. She has not been using her nebulized meds for the last month - they were bothering her eye (she recently had sgy on the R eye).  She is not having any more cough, no wheezing. She does have clear PND.   ROV 04/04/12 -- follows up for her COPD. Denies any problems or flares.  Last time we stopped pulmicort, changed the DuoNebs to prn. She denies cough, but she is having a lot of clear PND.   ROV 10/16/12 -- hx COPD, follows up. She is on duonebs prn, albuterol prn. She has not needed or used any BD's since last time. No flares.  She now follows with Dr Alyson Ingles.   ROV 04/16/13 -- hx COPD, has been managed on prn BD's + O2, used to be on pulmicort. She has not had any flares or hospitalizations. She has SOB w exertion and doing chores. She never uses her duonebs. Discussed today that she is likely undertreated for her COPD.   ROV 06/16/13 -- COPD, returns for eval  after starting Anoro last visit. She reports today that she believes the Anoro helped > was able to do more walking, less dyspnea. She would like to continue it to see how she does.   ROV 09/15/13 -- follow up visit for COPD and hypoxemia. She has A fib, HTN.  She has been doing fairly well although has not tolerated the very hot weather. She is having SOB with bathing and getting ready in the am, going up and down stairs. Her O2 is set on 2L/min at all times.  She ran out of Anoro several days ago, but is about to refill it and restart it. No flares since last time. No hospitalizations. She was started on xarelto for her A Fib since last time. Rare cough, never wheezes. Discussed prevnar today.   ROV 02/02/14 -- follows for her COPD, hypoxemia.  She feels that she has been at her baseline - no flares. She has some throat irritation, appears to be chronic. She has never used her SABA, has never felt that she needed it. She got the prevnar w Dr Alyson Ingles in 10/15. We discussed restarting Anoro last time, but she did not do this - she is curently on no BD at all.   ROV 08/31/14 -- follow-up visit for chronic dyspnea and chronic hypoxemic respiratory failure in the setting of COPD, atrial fibrillation, moderate aortic stenosis,  hypertension, chronic renal insufficiency. Last time she had come off of all of her bronchodilators and I asked her to restart Anoro, but she is not on it. History today is taken from the patient and her friend Inez Catalina. She is wearing oxygen. She states that her breathing has ups and downs, has been doing fairly well. By contrast her friend Inez Catalina tells me that she gets significantly dyspneic with just walking back and forth to the restroom. She has albuterol but never uses it.   I reviewed her labs from 07/13/14, show a mild metabolic alkalosis and a serum creatinine of 1.54, GFR 34.5.  She hasn't had a chest x-ray in our system since 2012.   ROV 12/01/14 -- follow-up visit for chronic hypoxemic  respiratory failure, chronic dyspnea, COPD. She also has superimposed atrial fibrillation, hypertension and moderate aortic stenosis that contribute to her overall dyspnea. At her last visit we increased her supple oxygen to 3 L/m when she gets her pulse system. We also added Spiriva respimat to see if she would benefit. In particular I was hopeful that her consistency with the medication would be improved now that she is in assisted living. She believes that she is breathing a bit better. She admits that she is not very active, doesn't walk much. She isn't sure that she is taking the medication correctly, not really inhaling it, just holding it in her mouth. No real cough or wheeze. No exacerbations                                                                                                                                                                                Objective:   Physical Exam Filed Vitals:   12/01/14 1418  BP: 120/74  Pulse: 90  Height: '5\' 7"'$  (1.702 m)  Weight: 134 lb (60.782 kg)  SpO2: 90%   Gen: Pleasant, thin, in no distress,  normal affect, mild kyphosis  ENT: No lesions,  mouth clear,  oropharynx clear, no postnasal drip,  Neck: No JVD, no TMG, no carotid bruits  Lungs: distant no wheezing  Cardiovascular: RRR, heart sounds normal, no murmur or gallops, no peripheral edema  Musculoskeletal: No deformities, no cyanosis or clubbing  Neuro: alert, non focal  Skin: Warm, no lesions or rashes   Assessment & Plan:  COPD (chronic obstructive pulmonary disease) She appears to be overall stable infection may be somewhat improved with the increase in her oxygen. It still unclear to me that she is taking the Spiriva correctly. She tells me that she is holding the medication in her mouth for 10 seconds, apparently without inhaling it. I have asked her to make sure that she inhales the medication deeply  into her lungs and holds it there. We will work on teaching her  again today and will try to pass this information along to her assisted living facility. Other instructions as below  Please continue your oxygen at 2 L/m when using your concentrator and at 3 L/m when using your portable system Please continue Spiriva 2 puffs once a day. We will review the technique for inhaling this medication today. You need to inhale the mist created by the inhaler deep into your chest and hold it for 5-10 seconds. Try to avoid just holding the medication in your mouth.  I recommend that you get the flu shot this fall Follow with Dr Lamonte Sakai in 6 months or sooner if you have any problems

## 2014-12-24 ENCOUNTER — Telehealth: Payer: Self-pay | Admitting: Emergency Medicine

## 2014-12-24 MED ORDER — ALBUTEROL SULFATE HFA 108 (90 BASE) MCG/ACT IN AERS: 2.0000 | INHALATION_SPRAY | RESPIRATORY_TRACT | Status: AC | PRN

## 2014-12-24 NOTE — Telephone Encounter (Signed)
According to my last note she should be on 3 L/m with exertion. But in order to ensure that the turn this up to 3 L/m when she walks. Also she should be on Spiriva respimat and ProAir 2 puffs q4h prn SOB.  It sounds like she also needs an acute office visit please work to set this up for next week

## 2014-12-24 NOTE — Telephone Encounter (Signed)
Called and spoke with pt nurse Bridgett. She is aware of below. Order faxed to her for the proair to [phone]. appt scheduled for Monday with Dr. Vaughan Browner. Nothing further needed

## 2014-12-24 NOTE — Telephone Encounter (Signed)
Called spoke with marnita w/ [facility]. She reports for the past 2 days pt can walk about 2-3 steps and her O2 levels drop 85% on 2 liters O2. At times, resting her O2 level will be 87%. Pt seems to be breathing heavy at times as well but O2 will be about 90-91% on 2 liters. Pt has also seems confused for about 2 days off and on. Pt has a rescue inhaler but doesn't seem to help and also [facility] will need an order for this if RB wants her to continue. Pt not able to come in for OV today. Requesting recs from [facility]? Please advise thanks

## 2014-12-27 ENCOUNTER — Ambulatory Visit (INDEPENDENT_AMBULATORY_CARE_PROVIDER_SITE_OTHER): Payer: Medicare Other | Admitting: Pulmonary Disease

## 2014-12-27 ENCOUNTER — Ambulatory Visit (INDEPENDENT_AMBULATORY_CARE_PROVIDER_SITE_OTHER)
Admission: RE | Admit: 2014-12-27 | Discharge: 2014-12-27 | Disposition: A | Discharge status: Home / Self Care | Payer: Medicare Other | Source: Ambulatory Visit | Attending: Pulmonary Disease | Admitting: Pulmonary Disease

## 2014-12-27 ENCOUNTER — Encounter: Payer: Self-pay | Admitting: Pulmonary Disease

## 2014-12-27 VITALS — BP 124/54 | HR 114 | Temp 98.2°F | Ht 67.0 in | Wt 133.8 lb

## 2014-12-27 DIAGNOSIS — J441 Chronic obstructive pulmonary disease with (acute) exacerbation: Secondary | ICD-10-CM

## 2014-12-27 MED ORDER — PREDNISONE 10 MG PO TABS: ORAL_TABLET | ORAL | Status: DC

## 2014-12-27 NOTE — Progress Notes (Signed)
Subjective:    Patient ID: Paula Contreras, female    DOB: [birth year], 81 y.o.   MRN: [id number]  HPI Pt of Dr. Lamonte Sakai with COPD, chronic hypoxemic respiratory failure superimposed atrial fibrillation, hypertension and AS.  Here for an acute visit. Complaints of increased shortness of breath in the nursing home. Noted to have desats to the 80s on exertion on 2 L O2 at the nursing home. In the office today she had sats in the upper 70s on 3 L pulsed. This was changed to continuous with improvement in sats to 90s. Had cough, no mucus. No fevers or chills.   Past Medical History  Diagnosis Date  . Cataract   . Other psoriasis and similar disorders     psoriasis vs eczema  . Hypertension   . COPD (chronic obstructive pulmonary disease)   . CKD (chronic kidney disease), stage III   . Persistent atrial fibrillation     a. Dx 07/2013. No attempts per Epic to restore NSR.  Marland Kitchen Aortic stenosis     a. 2D Echo 07/2013: EF 60-65%, no RWMA, grade 1 DD, normal LV filling pressure, moderate AS, mild TR, PASP 67mHg.  .Marland KitchenChronic respiratory failure     a. On home O2 since 2012.    Current outpatient prescriptions:  .  acetaminophen (TYLENOL) 325 MG tablet, Take 650 mg by mouth every 6 (six) hours as needed., Disp: , Rfl:  .  albuterol (PROAIR HFA) 108 (90 BASE) MCG/ACT inhaler, Inhale 2 puffs into the lungs every 4 (four) hours as needed for wheezing or shortness of breath., Disp: 1 Inhaler, Rfl: 3 .  alendronate (FOSAMAX) 70 MG tablet, Take 70 mg by mouth every 7 (seven) days. Take with a full glass of water on an empty stomach., Disp: , Rfl:  .  calcitRIOL (ROCALTROL) 0.25 MCG capsule, Take 0.25 mcg by mouth daily., Disp: , Rfl:  .  calcium carbonate (OS-CAL) 600 MG TABS tablet, Take 1 tablet by mouth daily. , Disp: , Rfl:  .  Cholecalciferol (VITAMIN D) 2000 UNITS CAPS, Take 2,000 Units by mouth daily., Disp: , Rfl:  .  Tiotropium Bromide Monohydrate (SPIRIVA RESPIMAT) 1.25 MCG/ACT AERS, Inhale 2 puffs  into the lungs daily., Disp: 1 Inhaler, Rfl: 2 .  XARELTO 20 MG TABS tablet, Take 20 mg by mouth daily., Disp: , Rfl:   Review of Systems  Constitutional: Negative for fever and unexpected weight change.  HENT: Negative for congestion, dental problem, ear pain, nosebleeds, postnasal drip, rhinorrhea, sinus pressure, sneezing, sore throat and trouble swallowing.   Eyes: Negative for redness and itching.  Respiratory: Positive for cough and shortness of breath. Negative for chest tightness and wheezing.   Cardiovascular: Negative for palpitations and leg swelling.  Gastrointestinal: Negative for nausea and vomiting.  Genitourinary: Negative for dysuria.  Musculoskeletal: Negative for joint swelling.  Skin: Negative for rash.  Neurological: Negative for headaches.  Hematological: Does not bruise/bleed easily.  Psychiatric/Behavioral: Negative for dysphoric mood. The patient is not nervous/anxious.    Data: Echo (43/82/50 Grade 1 diastolic dysfunction. Moderate AS.  PFTs (04/16/13) FVC 1.26 (445) FEV1 0.36 (17%) F/F 29 Very severe obstruction.   Blood pressure 124/54, pulse 114, temperature 98.2 F (36.8 C), temperature source Oral, height '5\' 7"'$  (1.702 m), weight 133 lb 12.8 oz (60.691 kg), SpO2 89 %.     Objective:   Physical Exam Gen.: Awake alert oriented, pleasant Neuro: No gross focal deficits. HEENT. PERRLA, EOMI. Neck: Supple no JVD CVS: S1-S2 heard,  no murmurs rubs gallops. Abdomen: Soft with positive bowel sounds.. Extremities 1+ edema.    Assessment & Plan:   GOLD class IV COPD from smoking. Desaturations on oxygen. Dyspnea. Diastolic dysfunction. Chronic kidney disease.  She may be having a COPD exacerbation although there is no wheezing heard on examination today. I don't think there is an infection going on here. She does have some lower extremity edema and I will get a chest x-ray to evaluate for any infiltrate or vascular congestion. Short prednisone taper  for a week.  Plan: - Change O2 order to 3 LPM continuous instead of pulsed. - Chest X ray today. - Short prednisone taper. Starting at '40mg'$  over 7 days.  Return in 1-2 weeks to evaluate response to therapy.  Marshell Garfinkel MD [facility] Pager [phone] If no answer or after 3pm call: [phone] 12/27/2014, 2:35 PM

## 2014-12-27 NOTE — Addendum Note (Signed)
Addended by: Parke Poisson E on: 12/27/2014 02:50 PM   Modules accepted: Orders

## 2014-12-28 NOTE — Progress Notes (Signed)
Quick Note:  Called [facility] (no other number on file for pt) and spoke with Bridgett (covering for pt's regular nurse). Advised of cxr results / recs as stated by PM. Bridgett voiced her understanding and requested report be faxed to her attention at [phone]. Also faxed to pt's PCP Dr. Alyson Ingles via [employer]. ______

## 2014-12-29 ENCOUNTER — Other Ambulatory Visit: Payer: Self-pay | Admitting: Family Medicine

## 2014-12-29 DIAGNOSIS — M810 Age-related osteoporosis without current pathological fracture: Secondary | ICD-10-CM

## 2015-01-07 ENCOUNTER — Other Ambulatory Visit: Payer: Medicare Other

## 2015-01-10 ENCOUNTER — Other Ambulatory Visit: Payer: Self-pay

## 2015-01-10 DIAGNOSIS — Z1231 Encounter for screening mammogram for malignant neoplasm of breast: Secondary | ICD-10-CM

## 2015-01-11 ENCOUNTER — Emergency Department (HOSPITAL_COMMUNITY): Payer: Medicare Other

## 2015-01-11 ENCOUNTER — Emergency Department (HOSPITAL_COMMUNITY)
Admission: EM | Admit: 2015-01-11 | Discharge: 2015-01-12 | Disposition: A | Discharge status: Home / Self Care | Payer: Medicare Other | Attending: Emergency Medicine | Admitting: Emergency Medicine

## 2015-01-11 ENCOUNTER — Encounter (HOSPITAL_COMMUNITY): Payer: Self-pay | Admitting: *Deleted

## 2015-01-11 DIAGNOSIS — Z872 Personal history of diseases of the skin and subcutaneous tissue: Secondary | ICD-10-CM | POA: Diagnosis not present

## 2015-01-11 DIAGNOSIS — Z8669 Personal history of other diseases of the nervous system and sense organs: Secondary | ICD-10-CM | POA: Diagnosis not present

## 2015-01-11 DIAGNOSIS — Z79899 Other long term (current) drug therapy: Secondary | ICD-10-CM | POA: Diagnosis not present

## 2015-01-11 DIAGNOSIS — R630 Anorexia: Secondary | ICD-10-CM | POA: Diagnosis not present

## 2015-01-11 DIAGNOSIS — N183 Chronic kidney disease, stage 3 (moderate): Secondary | ICD-10-CM | POA: Diagnosis not present

## 2015-01-11 DIAGNOSIS — I129 Hypertensive chronic kidney disease with stage 1 through stage 4 chronic kidney disease, or unspecified chronic kidney disease: Secondary | ICD-10-CM | POA: Insufficient documentation

## 2015-01-11 DIAGNOSIS — R5383 Other fatigue: Secondary | ICD-10-CM | POA: Diagnosis present

## 2015-01-11 DIAGNOSIS — Z87891 Personal history of nicotine dependence: Secondary | ICD-10-CM | POA: Insufficient documentation

## 2015-01-11 DIAGNOSIS — J441 Chronic obstructive pulmonary disease with (acute) exacerbation: Secondary | ICD-10-CM | POA: Insufficient documentation

## 2015-01-11 LAB — CBC WITH DIFFERENTIAL/PLATELET
BASOS ABS: 0.1 10*3/uL (ref 0.0–0.1)
BASOS PCT: 1 %
Eosinophils Absolute: 0.4 10*3/uL (ref 0.0–0.7)
Eosinophils Relative: 5 %
HEMATOCRIT: 31.4 % — AB (ref 36.0–46.0)
HEMOGLOBIN: 9.2 g/dL — AB (ref 12.0–15.0)
Lymphocytes Relative: 8 %
Lymphs Abs: 0.6 10*3/uL — ABNORMAL LOW (ref 0.7–4.0)
MCH: 27.5 pg (ref 26.0–34.0)
MCHC: 29.3 g/dL — ABNORMAL LOW (ref 30.0–36.0)
MCV: 94 fL (ref 78.0–100.0)
Monocytes Absolute: 1.1 10*3/uL — ABNORMAL HIGH (ref 0.1–1.0)
Monocytes Relative: 15 %
NEUTROS ABS: 5.2 10*3/uL (ref 1.7–7.7)
Neutrophils Relative %: 71 %
Platelets: 279 10*3/uL (ref 150–400)
RBC: 3.34 MIL/uL — ABNORMAL LOW (ref 3.87–5.11)
RDW: 14.6 % (ref 11.5–15.5)
WBC: 7.4 10*3/uL (ref 4.0–10.5)

## 2015-01-11 LAB — COMPREHENSIVE METABOLIC PANEL
ALT: 48 U/L (ref 14–54)
AST: 26 U/L (ref 15–41)
Albumin: 3.3 g/dL — ABNORMAL LOW (ref 3.5–5.0)
Alkaline Phosphatase: 147 U/L — ABNORMAL HIGH (ref 38–126)
Anion gap: 6 (ref 5–15)
BUN: 29 mg/dL — AB (ref 6–20)
CHLORIDE: 95 mmol/L — AB (ref 101–111)
CO2: 39 mmol/L — ABNORMAL HIGH (ref 22–32)
CREATININE: 1.2 mg/dL — AB (ref 0.44–1.00)
Calcium: 10.9 mg/dL — ABNORMAL HIGH (ref 8.9–10.3)
GFR calc non Af Amer: 42 mL/min — ABNORMAL LOW (ref >60)
GFR, EST AFRICAN AMERICAN: 48 mL/min — AB (ref >60)
Glucose, Bld: 106 mg/dL — ABNORMAL HIGH (ref 65–99)
Potassium: 4.5 mmol/L (ref 3.5–5.1)
Sodium: 140 mmol/L (ref 135–145)
Total Bilirubin: 0.2 mg/dL — ABNORMAL LOW (ref 0.3–1.2)
Total Protein: 7.5 g/dL (ref 6.5–8.1)

## 2015-01-11 LAB — MAGNESIUM: MAGNESIUM: 1.9 mg/dL (ref 1.7–2.4)

## 2015-01-11 LAB — BLOOD GAS, VENOUS
ACID-BASE EXCESS: 13 mmol/L — AB (ref 0.0–2.0)
Bicarbonate: 40.9 mEq/L — ABNORMAL HIGH (ref 20.0–24.0)
FIO2: 0.21
O2 Saturation: 52.5 %
PCO2 VEN: 80.6 mmHg — AB (ref 45.0–50.0)
Patient temperature: 98.6
TCO2: 39.3 mmol/L (ref 0–100)
pH, Ven: 7.326 — ABNORMAL HIGH (ref 7.250–7.300)
pO2, Ven: 32.1 mmHg (ref 30.0–45.0)

## 2015-01-11 LAB — PHOSPHORUS: Phosphorus: 5.2 mg/dL — ABNORMAL HIGH (ref 2.5–4.6)

## 2015-01-11 MED ORDER — IPRATROPIUM-ALBUTEROL 0.5-2.5 (3) MG/3ML IN SOLN
3.0000 mL | RESPIRATORY_TRACT | Status: DC | PRN
  Administered 2015-01-11: 3 mL via RESPIRATORY_TRACT
  Filled 2015-01-11: qty 3

## 2015-01-11 MED ORDER — PREDNISONE 20 MG PO TABS
60.0000 mg | ORAL_TABLET | Freq: Once | ORAL | Status: AC
  Administered 2015-01-11: 60 mg via ORAL
  Filled 2015-01-11: qty 3

## 2015-01-11 NOTE — ED Notes (Signed)
Venous blood gas results given to [facility]

## 2015-01-11 NOTE — ED Notes (Signed)
Pt denies much coughing.  Mild cough noted.  Pt blowing nose and mild congestion noted. Alert and oriented x 4.

## 2015-01-11 NOTE — ED Notes (Signed)
Bed: ZX28 Expected date:  Expected time:  Means of arrival:  Comments: EMS/22F/gen. malaise

## 2015-01-11 NOTE — Discharge Instructions (Signed)
Give albuterol/ipratropium every 4 hours as needed for shortness of breath and wheezing. Take steroids as prescribed. Return without fail for worsening symptoms, including fever, confusion, worsening shortness of breath, or any other symptoms concerning to you.  Chronic Obstructive Pulmonary Disease Exacerbation Chronic obstructive pulmonary disease (COPD) is a common lung problem. In COPD, the flow of air from the lungs is limited. COPD exacerbations are times that breathing gets worse and you need extra treatment. Without treatment they can be life threatening. If they happen often, your lungs can become more damaged. If your COPD gets worse, your doctor may treat you with:  Medicines.  Oxygen.  Different ways to clear your airway, such as using a mask. HOME CARE  Do not smoke.  Avoid tobacco smoke and other things that bother your lungs.  If given, take your antibiotic medicine as told. Finish the medicine even if you start to feel better.  Only take medicines as told by your doctor.  Drink enough fluids to keep your pee (urine) clear or pale yellow (unless your doctor has told you not to).  Use a cool mist machine (vaporizer).  If you use oxygen or a machine that turns liquid medicine into a mist (nebulizer), continue to use them as told.  Keep up with shots (vaccinations) as told by your doctor.  Exercise regularly.  Eat healthy foods.  Keep all doctor visits as told. GET HELP RIGHT AWAY IF:  You are very short of breath and it gets worse.  You have trouble talking.  You have bad chest pain.  You have blood in your spit (sputum).  You have a fever.  You keep throwing up (vomiting).  You feel weak, or you pass out (faint).  You feel confused.  You keep getting worse. MAKE SURE YOU:  Understand these instructions.  Will watch your condition.  Will get help right away if you are not doing well or get worse.   This information is not intended to replace  advice given to you by your health care provider. Make sure you discuss any questions you have with your health care provider.   Document Released: 03/08/2011 Document Revised: 04/09/2014 Document Reviewed: 11/21/2012 Elsevier Interactive Patient Education [employer].

## 2015-01-11 NOTE — ED Notes (Signed)
Pt cannot use restroom at this time, aware specimen is needed.

## 2015-01-11 NOTE — ED Provider Notes (Signed)
CSN: [id number]     Arrival date & time 01/11/15  2034 History   First MD Initiated Contact with Patient 01/11/15 2044     Chief Complaint  Patient presents with  . Fatigue     (Consider location/radiation/quality/duration/timing/severity/associated sxs/prior Treatment) HPI 81 year old female who presents with episode of shortness of breath. Although her chief complaints states fatigue, patient denies that she feels this way. History of COPD on 3 L home oxygen, aortic stenosis, atrial fibrillation on Xarelto, and CKD. Does endorse a decreased appetite over the past few days. She reports that she was sent to the ED for evaluation as nursing home staff felt that she was short of breath. She says that they measured her pulse oximetry to be in the low 80s today. She states that she did not feel short of breath when they measured her pulse ox. She denies chest pain, lightheadedness, syncope, increasing cough, increasing sputum production, fevers or chills. Denies nausea, vomiting, diarrhea, abdominal pain, or urinary symptoms. States that his nursing facility was not concerned about her breathing today that she would not have felt like she needed to come to the emergency department. Denies edema, orthopnea, or PND.  Past Medical History  Diagnosis Date  . Cataract   . Other psoriasis and similar disorders     psoriasis vs eczema  . Hypertension   . COPD (chronic obstructive pulmonary disease) ([place])   . CKD (chronic kidney disease), stage III   . Persistent atrial fibrillation ([place])     a. Dx 07/2013. No attempts per Epic to restore NSR.  Marland Kitchen Aortic stenosis     a. 2D Echo 07/2013: EF 60-65%, no RWMA, grade 1 DD, normal LV filling pressure, moderate AS, mild TR, PASP 32mHg.  .[place]     a. On home O2 since 2012.   Past Surgical History  Procedure Laterality Date  . Cataract extraction    . Breast surgery  1964  . Hip surgery      fracture   Family History   Problem Relation Age of Onset  . Asthma Mother   . Asthma Maternal Grandmother   . Lung cancer Father   . Lung cancer Paternal Uncle     father's twin brother   Social History  Substance Use Topics  . Smoking status: Former Smoker -- 1.00 packs/day for 571years    Quit date: 09/25/2010  . Smokeless tobacco: Never Used  . Alcohol Use: No   OB History    No data available     Review of Systems 10/14 systems reviewed and are negative other than those stated in the HPI    Allergies  Codeine  Home Medications   Prior to Admission medications   Medication Sig Start Date End Date Taking? Authorizing Provider  albuterol (PROAIR HFA) 108 (90 BASE) MCG/ACT inhaler Inhale 2 puffs into the lungs every 4 (four) hours as needed for wheezing or shortness of breath. 12/24/14  Yes RCollene Gobble MD  alendronate (FOSAMAX) 70 MG tablet Take 70 mg by mouth every 7 (seven) days. Take with a full glass of water on an empty stomach.   Yes Historical Provider, MD  calcium carbonate (OS-CAL) 600 MG TABS tablet Take 1 tablet by mouth daily.    Yes Historical Provider, MD  Cholecalciferol (VITAMIN D) 2000 UNITS CAPS Take 2,000 Units by mouth daily.   Yes Historical Provider, MD  lactose free nutrition (BOOST) LIQD Take 237 mLs by mouth 2 (two) times  daily between meals.   Yes Historical Provider, MD  Tiotropium Bromide Monohydrate (SPIRIVA RESPIMAT) 1.25 MCG/ACT AERS Inhale 2 puffs into the lungs daily. 08/31/14  Yes Collene Gobble, MD  XARELTO 15 MG TABS tablet Take 15 mg by mouth daily.  12/14/14  Yes Historical Provider, MD  acetaminophen (TYLENOL) 325 MG tablet Take 650 mg by mouth every 6 (six) hours as needed.    Historical Provider, MD  predniSONE (DELTASONE) 10 MG tablet Take 4 tabs for 2 days, then 3 tabs for 2 days, 2 tabs for 2 days, then 1 tab for 2 days, then stop. Patient not taking: Reported on 01/11/2015 12/27/14   Marshell Garfinkel, MD  predniSONE (DELTASONE) 10 MG tablet Take 4 tablets (40  mg total) by mouth daily. 01/13/15   Forde Dandy, MD   BP 127/79 mmHg  Pulse 86  Resp 18  SpO2 94% Physical Exam Physical Exam  Nursing note and vitals reviewed. Constitutional: Well developed, well nourished, non-toxic, and in no acute distress Head: Normocephalic and atraumatic.  Mouth/Throat: Oropharynx is clear and moist.  Neck: Normal range of motion. Neck supple.  Cardiovascular: Normal rate and regular rhythm.  No edema.  Pulmonary/Chest: Effort normal and breath sounds with poor air movement, diffuse expiratory wheezes Abdominal: Soft. There is no tenderness. There is no rebound and no guarding.  Musculoskeletal: Normal range of motion.  Neurological: Alert, no facial droop, fluent speech, moves all extremities symmetrically Skin: Skin is warm and dry.  Psychiatric: Cooperative   ED Course  Procedures (including critical care time) Labs Review Labs Reviewed  CBC WITH DIFFERENTIAL/PLATELET - Abnormal; Notable for the following:    RBC 3.34 (*)    Hemoglobin 9.2 (*)    HCT 31.4 (*)    MCHC 29.3 (*)    Lymphs Abs 0.6 (*)    Monocytes Absolute 1.1 (*)    All other components within normal limits  URINALYSIS, ROUTINE W REFLEX MICROSCOPIC (NOT AT [facility]) - Abnormal; Notable for the following:    Hgb urine dipstick TRACE (*)    Protein, ur 30 (*)    All other components within normal limits  COMPREHENSIVE METABOLIC PANEL - Abnormal; Notable for the following:    Chloride 95 (*)    CO2 39 (*)    Glucose, Bld 106 (*)    BUN 29 (*)    Creatinine, Ser 1.20 (*)    Calcium 10.9 (*)    Albumin 3.3 (*)    Alkaline Phosphatase 147 (*)    Total Bilirubin 0.2 (*)    GFR calc non Af Amer 42 (*)    GFR calc Af Amer 48 (*)    All other components within normal limits  PHOSPHORUS - Abnormal; Notable for the following:    Phosphorus 5.2 (*)    All other components within normal limits  BLOOD GAS, VENOUS - Abnormal; Notable for the following:    pH, Ven 7.326 (*)    pCO2, Ven  80.6 (*)    Bicarbonate 40.9 (*)    Acid-Base Excess 13.0 (*)    All other components within normal limits  MAGNESIUM  URINE MICROSCOPIC-ADD ON    Imaging Review Dg Chest 2 View  01/11/2015  CLINICAL DATA:  81 year old female with shortness of breath and fatigue. Initial encounter. COPD. EXAM: CHEST  2 VIEW COMPARISON:  12/27/2014 and earlier. FINDINGS: Semi upright AP and lateral views of the chest. Chronic hyperinflation with increased AP dimension to the chest. Stable mild cardiomegaly. Other mediastinal  contours are within normal limits. Calcified aortic atherosclerosis. Visualized tracheal air column is within normal limits. No pneumothorax, pulmonary edema, pleural effusion or consolidation. Increased interstitial markings in both lungs appear stable. Osteopenia with chronic lower thoracic and upper lumbar compression fractures. IMPRESSION: Stable chronic lung disease. No superimposed acute findings are identified. Electronically Signed   By: Genevie Ann M.D.   On: 01/11/2015 22:07   I have personally reviewed and evaluated these images and lab results as part of my medical decision-making.   EKG Interpretation   Date/Time:  Wednesday January 12 2015 00:44:21 EDT Ventricular Rate:  86 PR Interval:  161 QRS Duration: 89 QT Interval:  352 QTC Calculation: 421 R Axis:   33 Text Interpretation:  Sinus rhythm Improved EKG since last in 2012 No  ischemia Confirmed by Mann Skaggs MD, Kadan Millstein ([id number]) on 01/12/2015 12:53:48 PM      MDM   Final diagnoses:  COPD exacerbation (HCC)  Decreased appetite   81 year old female with COPD on 3L home oxygen, persistent AFib, CKD who presents with dyspenic episode from nursing home. She is well appearing in the ED. Speaks in full sentences with normal work of breathing and normal oxygenation on home 3 L [place]. Breath sounds revealing poor air movement though, with diffuse expiratory wheezing. No evidence of fluid overload on exam, and unlikely heart failure in  the setting of known moderate AS. CXR without evidence of edema, infiltrate or other acute cardiopulmonary processes. Given duo neb with clear breath sounds on re-evaluation. Blood work unremarkable for AKI, major electrolyte or metabolic derangements. UA without infection or ketones. Felt appropriate for discharge back to facility. Will treat for mild COPD exacerbation, and given steroid burst. Strict return and follow-up instructions reviewed. She expressed understanding of all discharge instructions and felt comfortable with the plan of care.      Forde Dandy, MD 01/12/15 1257

## 2015-01-11 NOTE — ED Notes (Signed)
Per EMS, pt from [facility].  Pt reports she has not been feeling well x 3 days with decreased appetite for 7 days.  Staff reports pt's has been hypoxic but upon arrival pt's sats were 100% RA.  Pt is A&Ox 4.  In NAD.

## 2015-01-12 LAB — URINE MICROSCOPIC-ADD ON

## 2015-01-12 LAB — URINALYSIS, ROUTINE W REFLEX MICROSCOPIC
Bilirubin Urine: NEGATIVE
Glucose, UA: NEGATIVE mg/dL
Ketones, ur: NEGATIVE mg/dL
Leukocytes, UA: NEGATIVE
NITRITE: NEGATIVE
Protein, ur: 30 mg/dL — AB
SPECIFIC GRAVITY, URINE: 1.017 (ref 1.005–1.030)
UROBILINOGEN UA: 0.2 mg/dL (ref 0.0–1.0)
pH: 5.5 (ref 5.0–8.0)

## 2015-01-12 MED ORDER — PREDNISONE 20 MG PO TABS: 40.0000 mg | ORAL_TABLET | Freq: Once | ORAL | Status: DC

## 2015-01-12 MED ORDER — PREDNISONE 10 MG PO TABS: 40.0000 mg | ORAL_TABLET | Freq: Every day | ORAL | Status: DC

## 2015-01-18 ENCOUNTER — Ambulatory Visit
Admission: RE | Admit: 2015-01-18 | Discharge: 2015-01-18 | Disposition: A | Discharge status: Home / Self Care | Payer: Medicare Other | Source: Ambulatory Visit

## 2015-01-18 DIAGNOSIS — Z1231 Encounter for screening mammogram for malignant neoplasm of breast: Secondary | ICD-10-CM

## 2015-01-26 ENCOUNTER — Ambulatory Visit: Payer: Medicare Other | Admitting: Podiatry

## 2015-01-27 ENCOUNTER — Other Ambulatory Visit: Payer: Self-pay | Admitting: Family Medicine

## 2015-01-27 DIAGNOSIS — R928 Other abnormal and inconclusive findings on diagnostic imaging of breast: Secondary | ICD-10-CM

## 2015-02-09 ENCOUNTER — Other Ambulatory Visit: Payer: Medicare Other

## 2015-02-10 ENCOUNTER — Other Ambulatory Visit: Payer: Medicare Other

## 2015-02-10 ENCOUNTER — Ambulatory Visit
Admission: RE | Admit: 2015-02-10 | Discharge: 2015-02-10 | Disposition: A | Discharge status: Home / Self Care | Payer: Medicare Other | Source: Ambulatory Visit | Attending: Family Medicine | Admitting: Family Medicine

## 2015-02-10 DIAGNOSIS — R928 Other abnormal and inconclusive findings on diagnostic imaging of breast: Secondary | ICD-10-CM

## 2015-02-19 ENCOUNTER — Emergency Department (HOSPITAL_COMMUNITY)
Admission: EM | Admit: 2015-02-19 | Discharge: 2015-02-19 | Disposition: A | Discharge status: Skilled Nursing Facility | Payer: Medicare Other | Source: Home / Self Care | Attending: Emergency Medicine | Admitting: Emergency Medicine

## 2015-02-19 ENCOUNTER — Emergency Department (HOSPITAL_COMMUNITY): Payer: Medicare Other

## 2015-02-19 ENCOUNTER — Encounter (HOSPITAL_COMMUNITY): Payer: Self-pay | Admitting: *Deleted

## 2015-02-19 DIAGNOSIS — R531 Weakness: Secondary | ICD-10-CM

## 2015-02-19 DIAGNOSIS — J9621 Acute and chronic respiratory failure with hypoxia: Secondary | ICD-10-CM | POA: Diagnosis not present

## 2015-02-19 DIAGNOSIS — J441 Chronic obstructive pulmonary disease with (acute) exacerbation: Secondary | ICD-10-CM | POA: Diagnosis not present

## 2015-02-19 LAB — CBC WITH DIFFERENTIAL/PLATELET
BASOS ABS: 0.1 10*3/uL (ref 0.0–0.1)
Basophils Relative: 1 %
EOS PCT: 1 %
Eosinophils Absolute: 0.1 10*3/uL (ref 0.0–0.7)
HEMATOCRIT: 31.8 % — AB (ref 36.0–46.0)
Hemoglobin: 9 g/dL — ABNORMAL LOW (ref 12.0–15.0)
LYMPHS ABS: 0.6 10*3/uL — AB (ref 0.7–4.0)
LYMPHS PCT: 6 %
MCH: 27.2 pg (ref 26.0–34.0)
MCHC: 28.3 g/dL — AB (ref 30.0–36.0)
MCV: 96.1 fL (ref 78.0–100.0)
MONO ABS: 1.7 10*3/uL — AB (ref 0.1–1.0)
MONOS PCT: 17 %
NEUTROS ABS: 7.1 10*3/uL (ref 1.7–7.7)
Neutrophils Relative %: 75 %
PLATELETS: 249 10*3/uL (ref 150–400)
RBC: 3.31 MIL/uL — ABNORMAL LOW (ref 3.87–5.11)
RDW: 14.6 % (ref 11.5–15.5)
WBC: 9.5 10*3/uL (ref 4.0–10.5)

## 2015-02-19 LAB — COMPREHENSIVE METABOLIC PANEL
ALBUMIN: 3.3 g/dL — AB (ref 3.5–5.0)
ALT: 41 U/L (ref 14–54)
AST: 29 U/L (ref 15–41)
Alkaline Phosphatase: 121 U/L (ref 38–126)
Anion gap: 10 (ref 5–15)
BUN: 25 mg/dL — AB (ref 6–20)
CHLORIDE: 94 mmol/L — AB (ref 101–111)
CO2: 37 mmol/L — AB (ref 22–32)
CREATININE: 1.17 mg/dL — AB (ref 0.44–1.00)
Calcium: 10.2 mg/dL (ref 8.9–10.3)
GFR calc Af Amer: 50 mL/min — ABNORMAL LOW (ref >60)
GFR, EST NON AFRICAN AMERICAN: 43 mL/min — AB (ref >60)
GLUCOSE: 102 mg/dL — AB (ref 65–99)
POTASSIUM: 4.3 mmol/L (ref 3.5–5.1)
Sodium: 141 mmol/L (ref 135–145)
Total Bilirubin: 0.4 mg/dL (ref 0.3–1.2)
Total Protein: 7.7 g/dL (ref 6.5–8.1)

## 2015-02-19 LAB — URINE MICROSCOPIC-ADD ON

## 2015-02-19 LAB — URINALYSIS, ROUTINE W REFLEX MICROSCOPIC
BILIRUBIN URINE: NEGATIVE
Glucose, UA: NEGATIVE mg/dL
KETONES UR: NEGATIVE mg/dL
Leukocytes, UA: NEGATIVE
NITRITE: NEGATIVE
Protein, ur: 100 mg/dL — AB
SPECIFIC GRAVITY, URINE: 1.019 (ref 1.005–1.030)
pH: 5.5 (ref 5.0–8.0)

## 2015-02-19 LAB — I-STAT TROPONIN, ED
Troponin i, poc: 0.01 ng/mL (ref 0.00–0.08)
Troponin i, poc: 0.01 ng/mL (ref 0.00–0.08)

## 2015-02-19 LAB — I-STAT CG4 LACTIC ACID, ED
LACTIC ACID, VENOUS: 0.54 mmol/L (ref 0.5–2.0)
Lactic Acid, Venous: 0.8 mmol/L (ref 0.5–2.0)

## 2015-02-19 LAB — CBG MONITORING, ED: GLUCOSE-CAPILLARY: 92 mg/dL (ref 65–99)

## 2015-02-19 MED ORDER — SODIUM CHLORIDE 0.9 % IV BOLUS (SEPSIS)
1000.0000 mL | Freq: Once | INTRAVENOUS | Status: AC
  Administered 2015-02-19: 1000 mL via INTRAVENOUS

## 2015-02-19 NOTE — ED Notes (Addendum)
Per EMS - patient comes from [facility] with c/o fall, confusion, dizziness x48 hours which is unusual for her.  Patient has hx of dementia.  Patient's vitals on scene WNL.  CBG 116.  Patient is on Xarelto for a-fib.  Patient complains of bilateral foot pain.  Patient is always on 3L of oxygen.

## 2015-02-19 NOTE — ED Notes (Signed)
Bed: WA03 Expected date: 02/19/15 Expected time: 7:43 AM Means of arrival: Ambulance Comments: confusion

## 2015-02-19 NOTE — ED Notes (Signed)
IV attempt unsuccessful to left forearm. Paula Contreras reports will attempt. IV consult placed.

## 2015-02-19 NOTE — ED Provider Notes (Signed)
CSN: [id number]     Arrival date & time 02/19/15  0805 History   First MD Initiated Contact with Patient 02/19/15 [phone]     Chief Complaint  Patient presents with  . Altered Mental Status  . Fall     (Consider location/radiation/quality/duration/timing/severity/associated sxs/prior Treatment) Patient is a 81 y.o. female presenting with fall.  Fall This is a recurrent problem. The current episode started 1 to 2 hours ago. Episode frequency: once. The problem has not changed since onset.Pertinent negatives include no chest pain, no abdominal pain, no headaches and no shortness of breath. Nothing aggravates the symptoms. Nothing relieves the symptoms.    Past Medical History  Diagnosis Date  . Cataract   . Other psoriasis and similar disorders     psoriasis vs eczema  . Hypertension   . COPD (chronic obstructive pulmonary disease) ([place])   . CKD (chronic kidney disease), stage III   . Persistent atrial fibrillation ([place])     a. Dx 07/2013. No attempts per Epic to restore NSR.  Marland Kitchen Aortic stenosis     a. 2D Echo 07/2013: EF 60-65%, no RWMA, grade 1 DD, normal LV filling pressure, moderate AS, mild TR, PASP 23mHg.  .[place] KitchenChronic respiratory failure ([place]     a. On home O2 since 2012.   Past Surgical History  Procedure Laterality Date  . Cataract extraction    . Breast surgery  1964  . Hip surgery      fracture   Family History  Problem Relation Age of Onset  . Asthma Mother   . Asthma Maternal Grandmother   . Lung cancer Father   . Lung cancer Paternal Uncle     father's twin brother   Social History  Substance Use Topics  . Smoking status: Former Smoker -- 1.00 packs/day for 584years    Quit date: 09/25/2010  . Smokeless tobacco: Never Used  . Alcohol Use: No   OB History    No data available     Review of Systems  Respiratory: Negative for shortness of breath.   Cardiovascular: Negative for chest pain.  Gastrointestinal: Negative for abdominal pain.  Neurological:  Negative for headaches.  All other systems reviewed and are negative.     Allergies  Codeine  Home Medications   Prior to Admission medications   Medication Sig Start Date End Date Taking? Authorizing Provider  acetaminophen (TYLENOL) 325 MG tablet Take 650 mg by mouth every 6 (six) hours as needed (for pain).    Yes Historical Provider, MD  albuterol (PROAIR HFA) 108 (90 BASE) MCG/ACT inhaler Inhale 2 puffs into the lungs every 4 (four) hours as needed for wheezing or shortness of breath. 12/24/14  Yes RCollene Gobble MD  alendronate (FOSAMAX) 70 MG tablet Take 70 mg by mouth every Monday. Take with a full glass of water on an empty stomach.   Yes Historical Provider, MD  calcium carbonate (OS-CAL) 600 MG TABS tablet Take 2 tablets by mouth daily with breakfast.    Yes Historical Provider, MD  Cholecalciferol (VITAMIN D) 2000 UNITS CAPS Take 2,000 Units by mouth daily with breakfast.    Yes Historical Provider, MD  lactose free nutrition (BOOST) LIQD Take 237 mLs by mouth 2 (two) times daily between meals.   Yes Historical Provider, MD  OXYGEN Inhale 2-3 L into the lungs continuous. Change to 3L when on portable   Yes Historical Provider, MD  Tiotropium Bromide Monohydrate (SPIRIVA RESPIMAT) 1.25 MCG/ACT AERS Inhale 2 puffs into  the lungs daily. 08/31/14  Yes Collene Gobble, MD  XARELTO 15 MG TABS tablet Take 15 mg by mouth daily with breakfast.  12/14/14  Yes Historical Provider, MD  predniSONE (DELTASONE) 10 MG tablet Take 4 tabs for 2 days, then 3 tabs for 2 days, 2 tabs for 2 days, then 1 tab for 2 days, then stop. Patient not taking: Reported on 01/11/2015 12/27/14   Marshell Garfinkel, MD  predniSONE (DELTASONE) 10 MG tablet Take 4 tablets (40 mg total) by mouth daily. Patient not taking: Reported on 02/19/2015 01/13/15   Forde Dandy, MD   BP 137/70 mmHg  Pulse 111  Temp(Src) 98.7 F (37.1 C) (Oral)  Resp 23  Ht '5\' 7"'$  (1.702 m)  Wt 130 lb (58.968 kg)  BMI 20.36 kg/m2  SpO2  96% Physical Exam  Constitutional: She is oriented to person, place, and time. She appears well-developed and well-nourished.  HENT:  Head: Normocephalic and atraumatic.  Right Ear: External ear normal.  Left Ear: External ear normal.  Eyes: Conjunctivae and EOM are normal. Pupils are equal, round, and reactive to light.  Neck: Normal range of motion. Neck supple.  Cardiovascular: Normal rate, regular rhythm, normal heart sounds and intact distal pulses.   Pulmonary/Chest: Effort normal and breath sounds normal.  Abdominal: Soft. Bowel sounds are normal. There is no tenderness.  Musculoskeletal: Normal range of motion.       Right hip: She exhibits tenderness and bony tenderness.       Right foot: There is tenderness. There is no bony tenderness.       Left foot: There is tenderness. There is no bony tenderness.  Neurological: She is alert and oriented to person, place, and time. She has normal strength and normal reflexes. No cranial nerve deficit or sensory deficit. GCS eye subscore is 4. GCS verbal subscore is 4. GCS motor subscore is 6.  Skin: Skin is warm and dry.  Vitals reviewed.   ED Course  Procedures (including critical care time) Labs Review Labs Reviewed  COMPREHENSIVE METABOLIC PANEL - Abnormal; Notable for the following:    Chloride 94 (*)    CO2 37 (*)    Glucose, Bld 102 (*)    BUN 25 (*)    Creatinine, Ser 1.17 (*)    Albumin 3.3 (*)    GFR calc non Af Amer 43 (*)    GFR calc Af Amer 50 (*)    All other components within normal limits  CBC WITH DIFFERENTIAL/PLATELET - Abnormal; Notable for the following:    RBC 3.31 (*)    Hemoglobin 9.0 (*)    HCT 31.8 (*)    MCHC 28.3 (*)    Lymphs Abs 0.6 (*)    Monocytes Absolute 1.7 (*)    All other components within normal limits  URINALYSIS, ROUTINE W REFLEX MICROSCOPIC (NOT AT [facility] - Abnormal; Notable for the following:    Hgb urine dipstick SMALL (*)    Protein, ur 100 (*)    All other components within normal  limits  URINE MICROSCOPIC-ADD ON - Abnormal; Notable for the following:    Squamous Epithelial / LPF 0-5 (*)    Bacteria, UA RARE (*)    Casts GRANULAR CAST (*)    All other components within normal limits  CBG MONITORING, ED  I-STAT TROPOININ, ED  I-STAT CG4 LACTIC ACID, ED  I-STAT CG4 LACTIC ACID, ED  Randolm Idol, ED    Imaging Review Dg Chest 2 View  02/19/2015  CLINICAL DATA:  Dizziness and confusion. EXAM: CHEST  2 VIEW COMPARISON:  01/11/2015 FINDINGS: The heart size is normal. Aortic atherosclerosis noted. No pleural effusion or edema. The lungs are hyperinflated. There are coarsened interstitial markings identified bilaterally compatible with COPD/emphysema. Multi level compression deformities noted within the lower thoracic and upper lumbar spine. IMPRESSION: 1. Advanced changes of COPD/emphysema. 2. Aortic atherosclerosis 3. Age indeterminate thoracic compression deformities. Electronically Signed   By: Kerby Moors M.D.   On: 02/19/2015 09:44   Ct Head Wo Contrast  02/19/2015  CLINICAL DATA:  Fall and confusion.  Dizziness for 48 hours. EXAM: CT HEAD WITHOUT CONTRAST TECHNIQUE: Contiguous axial images were obtained from the base of the skull through the vertex without contrast. COMPARISON:  None FINDINGS: Diffuse low-density throughout the periventricular and subcortical white matter. No evidence for acute hemorrhage, mass lesion, midline shift, hydrocephalus or large infarct. Scattered areas of low-density in the cerebellum, particularly along the left cerebellar peduncle is nonspecific. Fluid in the right mastoid air cells. Mucosal thickening involving the right maxillary sinus, ethmoid air cells, right sphenoid sinus and right frontal sinus. No acute bone abnormality. IMPRESSION: No acute intracranial abnormality. Extensive white matter disease possibly representing small vessel ischemic changes. Extensive paranasal sinus disease and there is fluid in the right mastoid air  cells. Electronically Signed   By: Markus Daft M.D.   On: 02/19/2015 09:49   Dg Foot Complete Left  02/19/2015  CLINICAL DATA:  Foot pain secondary to a fall. EXAM: LEFT FOOT - COMPLETE 3+ VIEW COMPARISON:  None. FINDINGS: There is no evidence of fracture or dislocation. Degenerative arthritis at the first metatarsal phalangeal joint. Soft tissues are unremarkable. Diffuse osteopenia. IMPRESSION: No acute abnormality. Electronically Signed   By: Lorriane Shire M.D.   On: 02/19/2015 09:45   Dg Foot Complete Right  02/19/2015  CLINICAL DATA:  Dizziness and confusion x48hrs. Fall at assisted living home. Hx of dementia, COPD, Chronic Respiratory Failure, Breast surgery 1964, Hip fracture unstated laterality. Former smoker 1 pack/ day for 55 years. Bilateral foot pain. EXAM: RIGHT FOOT COMPLETE - 3+ VIEW COMPARISON:  None. FINDINGS: Bones are diffusely demineralized. There is no fracture dislocation. There are no significant arthropathic changes. Soft tissues are unremarkable. IMPRESSION: No acute fracture or dislocation. Electronically Signed   By: Lajean Manes M.D.   On: 02/19/2015 09:44   Dg Hip Unilat With Pelvis 2-3 Views Right  02/19/2015  CLINICAL DATA:  Dizziness and confusion x48hrs. Fall at assisted living home. Hx of dementia, COPD, Chronic Respiratory Failure, Breast surgery 1964, Hip fracture unstated laterality. Former smoker 1 pack/ day for 55 years. Bilateral foot pain. EXAM: DG HIP (WITH OR WITHOUT PELVIS) 2-3V RIGHT COMPARISON:  None. FINDINGS: No acute fracture. Old right proximal femur fracture has been reduced with 2 screws. Fracture is well healed. The hip joints, SI joints and symphysis pubis are normally spaced and aligned. Bones are diffusely demineralized. Soft tissues are unremarkable. IMPRESSION: 1. No acute fracture or dislocation. Electronically Signed   By: Lajean Manes M.D.   On: 02/19/2015 09:43   I have personally reviewed and evaluated these images and lab results as  part of my medical decision-making.   EKG Interpretation   Date/Time:  Saturday February 19 2015 08:28:38 EST Ventricular Rate:  93 PR Interval:  165 QRS Duration: 94 QT Interval:  362 QTC Calculation: 450 R Axis:   131 Text Interpretation:  Sinus rhythm Probable lateral infarct, age  indeterminate No significant change since last  tracing Confirmed by  Debby Freiberg [phone]) on 02/19/2015 9:54:51 AM      MDM   Final diagnoses:  Generalized weakness    81 y.o. female with pertinent PMH of COPD on home O2 3L, CKD, HTN presents with generalized weakness and fall.  No hit to head or LOC reported, however pt cannot clearly specify why she fell.  On arrival vitals and physical exam as above.  Small amount of tenderness to L hip, otherwise unremarkable, and pt not tachycardic on my exam on multiple occasions, including just prior to discharge.    Wu unremarkable.  Likely etiology orthostatic weakness, no chest pain, dyspnea above baseline, or other symptoms.  Asymptomatic at time of dc after 1L NS bolus.  DC home in stable condition  I have reviewed all laboratory and imaging studies if ordered as above  1. Generalized weakness         Debby Freiberg, MD 02/19/15 1558

## 2015-02-19 NOTE — ED Notes (Signed)
[place] at bedside.

## 2015-02-19 NOTE — ED Notes (Signed)
Pt attempted to use restroom with no success, will try at later time.

## 2015-02-19 NOTE — Discharge Instructions (Signed)

## 2015-02-20 ENCOUNTER — Emergency Department (HOSPITAL_COMMUNITY): Payer: Medicare Other

## 2015-02-20 ENCOUNTER — Encounter (HOSPITAL_COMMUNITY): Payer: Self-pay

## 2015-02-20 ENCOUNTER — Inpatient Hospital Stay (HOSPITAL_COMMUNITY)
Admission: EM | Admit: 2015-02-20 | Discharge: 2015-02-23 | DRG: 190 | Disposition: A | Discharge status: Skilled Nursing Facility | Payer: Medicare Other | Attending: Internal Medicine | Admitting: Internal Medicine

## 2015-02-20 DIAGNOSIS — R531 Weakness: Secondary | ICD-10-CM

## 2015-02-20 DIAGNOSIS — E870 Hyperosmolality and hypernatremia: Secondary | ICD-10-CM | POA: Diagnosis present

## 2015-02-20 DIAGNOSIS — R64 Cachexia: Secondary | ICD-10-CM | POA: Diagnosis present

## 2015-02-20 DIAGNOSIS — E872 Acidosis: Secondary | ICD-10-CM | POA: Diagnosis present

## 2015-02-20 DIAGNOSIS — W19XXXA Unspecified fall, initial encounter: Secondary | ICD-10-CM | POA: Diagnosis present

## 2015-02-20 DIAGNOSIS — Z6822 Body mass index (BMI) 22.0-22.9, adult: Secondary | ICD-10-CM

## 2015-02-20 DIAGNOSIS — I482 Chronic atrial fibrillation: Secondary | ICD-10-CM | POA: Diagnosis present

## 2015-02-20 DIAGNOSIS — Z79899 Other long term (current) drug therapy: Secondary | ICD-10-CM | POA: Diagnosis not present

## 2015-02-20 DIAGNOSIS — H269 Unspecified cataract: Secondary | ICD-10-CM | POA: Diagnosis present

## 2015-02-20 DIAGNOSIS — Z9981 Dependence on supplemental oxygen: Secondary | ICD-10-CM

## 2015-02-20 DIAGNOSIS — I129 Hypertensive chronic kidney disease with stage 1 through stage 4 chronic kidney disease, or unspecified chronic kidney disease: Secondary | ICD-10-CM | POA: Diagnosis present

## 2015-02-20 DIAGNOSIS — M79673 Pain in unspecified foot: Secondary | ICD-10-CM | POA: Diagnosis present

## 2015-02-20 DIAGNOSIS — Z87891 Personal history of nicotine dependence: Secondary | ICD-10-CM

## 2015-02-20 DIAGNOSIS — G9340 Encephalopathy, unspecified: Secondary | ICD-10-CM | POA: Diagnosis not present

## 2015-02-20 DIAGNOSIS — Z7901 Long term (current) use of anticoagulants: Secondary | ICD-10-CM

## 2015-02-20 DIAGNOSIS — D638 Anemia in other chronic diseases classified elsewhere: Secondary | ICD-10-CM | POA: Diagnosis present

## 2015-02-20 DIAGNOSIS — R4182 Altered mental status, unspecified: Secondary | ICD-10-CM

## 2015-02-20 DIAGNOSIS — Z7952 Long term (current) use of systemic steroids: Secondary | ICD-10-CM

## 2015-02-20 DIAGNOSIS — I350 Nonrheumatic aortic (valve) stenosis: Secondary | ICD-10-CM | POA: Diagnosis present

## 2015-02-20 DIAGNOSIS — L409 Psoriasis, unspecified: Secondary | ICD-10-CM | POA: Diagnosis present

## 2015-02-20 DIAGNOSIS — Z66 Do not resuscitate: Secondary | ICD-10-CM | POA: Diagnosis present

## 2015-02-20 DIAGNOSIS — J9621 Acute and chronic respiratory failure with hypoxia: Secondary | ICD-10-CM | POA: Diagnosis present

## 2015-02-20 DIAGNOSIS — M1990 Unspecified osteoarthritis, unspecified site: Secondary | ICD-10-CM | POA: Diagnosis present

## 2015-02-20 DIAGNOSIS — E860 Dehydration: Secondary | ICD-10-CM

## 2015-02-20 DIAGNOSIS — R296 Repeated falls: Secondary | ICD-10-CM | POA: Diagnosis present

## 2015-02-20 DIAGNOSIS — I480 Paroxysmal atrial fibrillation: Secondary | ICD-10-CM

## 2015-02-20 DIAGNOSIS — F0390 Unspecified dementia without behavioral disturbance: Secondary | ICD-10-CM | POA: Diagnosis present

## 2015-02-20 DIAGNOSIS — D649 Anemia, unspecified: Secondary | ICD-10-CM | POA: Diagnosis not present

## 2015-02-20 DIAGNOSIS — J441 Chronic obstructive pulmonary disease with (acute) exacerbation: Secondary | ICD-10-CM | POA: Diagnosis not present

## 2015-02-20 DIAGNOSIS — Z885 Allergy status to narcotic agent status: Secondary | ICD-10-CM | POA: Diagnosis not present

## 2015-02-20 DIAGNOSIS — Z515 Encounter for palliative care: Secondary | ICD-10-CM | POA: Insufficient documentation

## 2015-02-20 DIAGNOSIS — Y92129 Unspecified place in nursing home as the place of occurrence of the external cause: Secondary | ICD-10-CM

## 2015-02-20 DIAGNOSIS — J9610 Chronic respiratory failure, unspecified whether with hypoxia or hypercapnia: Secondary | ICD-10-CM | POA: Diagnosis present

## 2015-02-20 DIAGNOSIS — D72829 Elevated white blood cell count, unspecified: Secondary | ICD-10-CM

## 2015-02-20 DIAGNOSIS — J9622 Acute and chronic respiratory failure with hypercapnia: Secondary | ICD-10-CM | POA: Diagnosis not present

## 2015-02-20 DIAGNOSIS — R0602 Shortness of breath: Secondary | ICD-10-CM

## 2015-02-20 DIAGNOSIS — J40 Bronchitis, not specified as acute or chronic: Secondary | ICD-10-CM | POA: Diagnosis present

## 2015-02-20 DIAGNOSIS — G9341 Metabolic encephalopathy: Secondary | ICD-10-CM | POA: Diagnosis present

## 2015-02-20 DIAGNOSIS — J449 Chronic obstructive pulmonary disease, unspecified: Secondary | ICD-10-CM | POA: Diagnosis not present

## 2015-02-20 DIAGNOSIS — N183 Chronic kidney disease, stage 3 unspecified: Secondary | ICD-10-CM | POA: Diagnosis present

## 2015-02-20 DIAGNOSIS — J9611 Chronic respiratory failure with hypoxia: Secondary | ICD-10-CM | POA: Diagnosis not present

## 2015-02-20 DIAGNOSIS — R400 Somnolence: Secondary | ICD-10-CM | POA: Diagnosis not present

## 2015-02-20 DIAGNOSIS — I10 Essential (primary) hypertension: Secondary | ICD-10-CM

## 2015-02-20 HISTORY — DX: Anemia, unspecified: D64.9

## 2015-02-20 LAB — BASIC METABOLIC PANEL
ANION GAP: 13 (ref 5–15)
BUN: 30 mg/dL — AB (ref 6–20)
CALCIUM: 9.6 mg/dL (ref 8.9–10.3)
CO2: 35 mmol/L — ABNORMAL HIGH (ref 22–32)
Chloride: 98 mmol/L — ABNORMAL LOW (ref 101–111)
Creatinine, Ser: 1.27 mg/dL — ABNORMAL HIGH (ref 0.44–1.00)
GFR calc Af Amer: 45 mL/min — ABNORMAL LOW (ref >60)
GFR, EST NON AFRICAN AMERICAN: 39 mL/min — AB (ref >60)
GLUCOSE: 101 mg/dL — AB (ref 65–99)
POTASSIUM: 4.5 mmol/L (ref 3.5–5.1)
SODIUM: 146 mmol/L — AB (ref 135–145)

## 2015-02-20 LAB — MAGNESIUM: MAGNESIUM: 1.9 mg/dL (ref 1.7–2.4)

## 2015-02-20 LAB — URINALYSIS, ROUTINE W REFLEX MICROSCOPIC
GLUCOSE, UA: NEGATIVE mg/dL
KETONES UR: 15 mg/dL — AB
Nitrite: NEGATIVE
PROTEIN: 100 mg/dL — AB
Specific Gravity, Urine: 1.018 (ref 1.005–1.030)
pH: 5.5 (ref 5.0–8.0)

## 2015-02-20 LAB — CBC WITH DIFFERENTIAL/PLATELET
BASOS ABS: 0.1 10*3/uL (ref 0.0–0.1)
Basophils Relative: 1 %
EOS ABS: 0 10*3/uL (ref 0.0–0.7)
EOS PCT: 0 %
HCT: 34.5 % — ABNORMAL LOW (ref 36.0–46.0)
Hemoglobin: 10 g/dL — ABNORMAL LOW (ref 12.0–15.0)
LYMPHS PCT: 9 %
Lymphs Abs: 1 10*3/uL (ref 0.7–4.0)
MCH: 27.8 pg (ref 26.0–34.0)
MCHC: 29 g/dL — ABNORMAL LOW (ref 30.0–36.0)
MCV: 95.8 fL (ref 78.0–100.0)
MONO ABS: 1.9 10*3/uL — AB (ref 0.1–1.0)
Monocytes Relative: 17 %
Neutro Abs: 8.3 10*3/uL — ABNORMAL HIGH (ref 1.7–7.7)
Neutrophils Relative %: 73 %
PLATELETS: 276 10*3/uL (ref 150–400)
RBC: 3.6 MIL/uL — AB (ref 3.87–5.11)
RDW: 14.6 % (ref 11.5–15.5)
WBC: 11.3 10*3/uL — AB (ref 4.0–10.5)

## 2015-02-20 LAB — BLOOD GAS, ARTERIAL
ACID-BASE EXCESS: 4.7 mmol/L — AB (ref 0.0–2.0)
BICARBONATE: 33.5 meq/L — AB (ref 20.0–24.0)
DRAWN BY: 295031
O2 CONTENT: 4 L/min
O2 SAT: 95.6 %
PATIENT TEMPERATURE: 98.6
PO2 ART: 94 mmHg (ref 80.0–100.0)
TCO2: 33.2 mmol/L (ref 0–100)
pCO2 arterial: 87.3 mmHg (ref 35.0–45.0)
pH, Arterial: 7.209 — ABNORMAL LOW (ref 7.350–7.450)

## 2015-02-20 LAB — I-STAT TROPONIN, ED: TROPONIN I, POC: 0.01 ng/mL (ref 0.00–0.08)

## 2015-02-20 LAB — URINE MICROSCOPIC-ADD ON

## 2015-02-20 LAB — INFLUENZA PANEL BY PCR (TYPE A & B)
H1N1 flu by pcr: NOT DETECTED
Influenza A By PCR: NEGATIVE
Influenza B By PCR: NEGATIVE

## 2015-02-20 LAB — I-STAT CG4 LACTIC ACID, ED: Lactic Acid, Venous: 1.26 mmol/L (ref 0.5–2.0)

## 2015-02-20 LAB — MRSA PCR SCREENING: MRSA by PCR: NEGATIVE

## 2015-02-20 LAB — PHOSPHORUS: PHOSPHORUS: 4.9 mg/dL — AB (ref 2.5–4.6)

## 2015-02-20 LAB — TSH: TSH: 0.927 u[IU]/mL (ref 0.350–4.500)

## 2015-02-20 MED ORDER — IPRATROPIUM BROMIDE 0.02 % IN SOLN: 0.5000 mg | RESPIRATORY_TRACT | Status: DC | PRN

## 2015-02-20 MED ORDER — METHYLPREDNISOLONE SODIUM SUCC 40 MG IJ SOLR
40.0000 mg | Freq: Four times a day (QID) | INTRAMUSCULAR | Status: DC
  Administered 2015-02-20 – 2015-02-21 (×5): 40 mg via INTRAVENOUS
  Filled 2015-02-20 (×5): qty 1

## 2015-02-20 MED ORDER — LEVOFLOXACIN IN D5W 250 MG/50ML IV SOLN
250.0000 mg | INTRAVENOUS | Status: DC
  Administered 2015-02-21: 250 mg via INTRAVENOUS
  Filled 2015-02-20 (×2): qty 50

## 2015-02-20 MED ORDER — LEVALBUTEROL HCL 0.63 MG/3ML IN NEBU: 0.6300 mg | INHALATION_SOLUTION | RESPIRATORY_TRACT | Status: DC | PRN

## 2015-02-20 MED ORDER — IPRATROPIUM BROMIDE 0.02 % IN SOLN
0.5000 mg | Freq: Four times a day (QID) | RESPIRATORY_TRACT | Status: DC
  Administered 2015-02-20 – 2015-02-21 (×5): 0.5 mg via RESPIRATORY_TRACT
  Filled 2015-02-20 (×6): qty 2.5

## 2015-02-20 MED ORDER — LEVALBUTEROL HCL 0.63 MG/3ML IN NEBU
0.6300 mg | INHALATION_SOLUTION | Freq: Four times a day (QID) | RESPIRATORY_TRACT | Status: DC
  Administered 2015-02-20 – 2015-02-21 (×5): 0.63 mg via RESPIRATORY_TRACT
  Filled 2015-02-20 (×6): qty 3

## 2015-02-20 MED ORDER — ENOXAPARIN SODIUM 30 MG/0.3ML ~~LOC~~ SOLN: 30.0000 mg | SUBCUTANEOUS | Status: DC

## 2015-02-20 MED ORDER — GUAIFENESIN ER 600 MG PO TB12: 1200.0000 mg | ORAL_TABLET | Freq: Two times a day (BID) | ORAL | Status: DC

## 2015-02-20 MED ORDER — SODIUM CHLORIDE 0.9 % IV BOLUS (SEPSIS): 500.0000 mL | Freq: Once | INTRAVENOUS | Status: DC

## 2015-02-20 MED ORDER — SORBITOL 70 % SOLN: 30.0000 mL | Freq: Every day | Status: DC | PRN

## 2015-02-20 MED ORDER — ACETAMINOPHEN 325 MG PO TABS: 650.0000 mg | ORAL_TABLET | Freq: Four times a day (QID) | ORAL | Status: DC | PRN

## 2015-02-20 MED ORDER — LEVOFLOXACIN IN D5W 500 MG/100ML IV SOLN
500.0000 mg | INTRAVENOUS | Status: DC
  Administered 2015-02-20: 500 mg via INTRAVENOUS
  Filled 2015-02-20: qty 100

## 2015-02-20 MED ORDER — ACETAMINOPHEN 650 MG RE SUPP: 650.0000 mg | Freq: Four times a day (QID) | RECTAL | Status: DC | PRN

## 2015-02-20 MED ORDER — SODIUM CHLORIDE 0.9 % IJ SOLN
3.0000 mL | Freq: Two times a day (BID) | INTRAMUSCULAR | Status: DC
  Administered 2015-02-22 – 2015-02-23 (×3): 3 mL via INTRAVENOUS

## 2015-02-20 MED ORDER — OSELTAMIVIR PHOSPHATE 6 MG/ML PO SUSR
30.0000 mg | Freq: Two times a day (BID) | ORAL | Status: DC
  Administered 2015-02-20 – 2015-02-23 (×5): 30 mg via ORAL
  Filled 2015-02-20 (×8): qty 5

## 2015-02-20 MED ORDER — ACETYLCYSTEINE 20 % IN SOLN
3.0000 mL | Freq: Two times a day (BID) | RESPIRATORY_TRACT | Status: DC
  Administered 2015-02-20: 3 mL via RESPIRATORY_TRACT
  Filled 2015-02-20 (×2): qty 4

## 2015-02-20 MED ORDER — SODIUM CHLORIDE 0.9 % IV SOLN
INTRAVENOUS | Status: DC
  Administered 2015-02-20 (×2): via INTRAVENOUS

## 2015-02-20 MED ORDER — ONDANSETRON HCL 4 MG PO TABS
4.0000 mg | ORAL_TABLET | Freq: Four times a day (QID) | ORAL | Status: DC | PRN
Start: 2015-02-20 — End: 2015-02-20

## 2015-02-20 MED ORDER — SODIUM CHLORIDE 0.9 % IV BOLUS (SEPSIS)
500.0000 mL | Freq: Once | INTRAVENOUS | Status: AC
  Administered 2015-02-20: 500 mL via INTRAVENOUS

## 2015-02-20 MED ORDER — RIVAROXABAN 15 MG PO TABS: 15.0000 mg | ORAL_TABLET | Freq: Every day | ORAL | Status: DC

## 2015-02-20 MED ORDER — FUROSEMIDE 10 MG/ML IJ SOLN
20.0000 mg | Freq: Once | INTRAMUSCULAR | Status: AC
  Administered 2015-02-20: 20 mg via INTRAVENOUS
  Filled 2015-02-20: qty 2

## 2015-02-20 MED ORDER — GUAIFENESIN-DM 100-10 MG/5ML PO SYRP: 5.0000 mL | ORAL_SOLUTION | ORAL | Status: DC | PRN

## 2015-02-20 MED ORDER — ONDANSETRON HCL 4 MG/2ML IJ SOLN: 4.0000 mg | Freq: Four times a day (QID) | INTRAMUSCULAR | Status: DC | PRN

## 2015-02-20 MED ORDER — SENNOSIDES-DOCUSATE SODIUM 8.6-50 MG PO TABS: 1.0000 | ORAL_TABLET | Freq: Every evening | ORAL | Status: DC | PRN

## 2015-02-20 MED ORDER — METHYLPREDNISOLONE SODIUM SUCC 125 MG IJ SOLR
80.0000 mg | Freq: Four times a day (QID) | INTRAMUSCULAR | Status: DC
  Administered 2015-02-20: 80 mg via INTRAVENOUS
  Filled 2015-02-20: qty 2

## 2015-02-20 MED ORDER — ALUM & MAG HYDROXIDE-SIMETH 200-200-20 MG/5ML PO SUSP: 30.0000 mL | Freq: Four times a day (QID) | ORAL | Status: DC | PRN

## 2015-02-20 MED ORDER — ENOXAPARIN SODIUM 30 MG/0.3ML ~~LOC~~ SOLN
30.0000 mg | SUBCUTANEOUS | Status: DC
  Administered 2015-02-20 – 2015-02-21 (×2): 30 mg via SUBCUTANEOUS
  Filled 2015-02-20 (×2): qty 0.3

## 2015-02-20 NOTE — ED Notes (Signed)
Bed: BO17 Expected date:  Expected time:  Means of arrival:  Comments: EMS/fall yesterday/[facility]

## 2015-02-20 NOTE — ED Provider Notes (Signed)
CSN: [id number]     Arrival date & time 02/20/15  0744 History   First MD Initiated Contact with Patient 02/20/15 0751     Chief Complaint  Patient presents with  . Fall    HPI   Paula Contreras is a 81 y.o. female with a PMH of COPD on 3L home O2, HTN, CKD, atrial fibrillation on xarelto who presents to the ED due to being difficult to arouse this morning at her living facility. Per report, EMS found patient to be alert and in no distress. Patient was evaluated yesterday in the ED for fall and altered mental status, and was discharged in stable condition.   Patient's sister in law is present at bedside, who states the patient has been weaker than usual and has been reported to be difficult to arouse for the past several months. Spoke with patient's nurse at the nursing facility, who states the patient was trying to adjust her oxygen this morning and was "shaking." Her nurse said that she was not responding to her name, which is not usual for her.  Past Medical History  Diagnosis Date  . Cataract   . Other psoriasis and similar disorders     psoriasis vs eczema  . Hypertension   . COPD (chronic obstructive pulmonary disease) ([place])   . CKD (chronic kidney disease), stage III   . Persistent atrial fibrillation ([place])     a. Dx 07/2013. No attempts per Epic to restore NSR.  Marland Kitchen Aortic stenosis     a. 2D Echo 07/2013: EF 60-65%, no RWMA, grade 1 DD, normal LV filling pressure, moderate AS, mild TR, PASP 65mHg.  .[place]     a. On home O2 since 2012.   Past Surgical History  Procedure Laterality Date  . Cataract extraction    . Breast surgery  1964  . Hip surgery      fracture   Family History  Problem Relation Age of Onset  . Asthma Mother   . Asthma Maternal Grandmother   . Lung cancer Father   . Lung cancer Paternal Uncle     father's twin brother   Social History  Substance Use Topics  . Smoking status: Former Smoker -- 1.00 packs/day for 556years    Quit date: 09/25/2010  . Smokeless tobacco: Never Used  . Alcohol Use: No   OB History    No data available      Review of Systems  Constitutional: Negative for fever and chills.  Respiratory: Negative for shortness of breath.   Cardiovascular: Negative for chest pain.  Gastrointestinal: Negative for nausea, vomiting, abdominal pain, diarrhea and constipation.  Neurological: Positive for weakness. Negative for dizziness, light-headedness and headaches.  All other systems reviewed and are negative.     Allergies  Codeine  Home Medications   Prior to Admission medications   Medication Sig Start Date End Date Taking? Authorizing Provider  acetaminophen (TYLENOL) 325 MG tablet Take 650 mg by mouth every 6 (six) hours as needed (for pain).    Yes Historical Provider, MD  albuterol (PROAIR HFA) 108 (90 BASE) MCG/ACT inhaler Inhale 2 puffs into the lungs every 4 (four) hours as needed for wheezing or shortness of breath. 12/24/14  Yes RCollene Gobble MD  alendronate (FOSAMAX) 70 MG tablet Take 70 mg by mouth every Monday. Take with a full glass of water on an empty stomach.   Yes Historical Provider, MD  calcium carbonate (OS-CAL) 600 MG TABS tablet  Take 2 tablets by mouth daily with breakfast.    Yes Historical Provider, MD  Cholecalciferol (VITAMIN D) 2000 UNITS CAPS Take 2,000 Units by mouth daily with breakfast.    Yes Historical Provider, MD  lactose free nutrition (BOOST) LIQD Take 237 mLs by mouth 2 (two) times daily between meals.   Yes Historical Provider, MD  OXYGEN Inhale 2-3 L into the lungs continuous. Change to 3L when on portable   Yes Historical Provider, MD  Tiotropium Bromide Monohydrate (SPIRIVA RESPIMAT) 1.25 MCG/ACT AERS Inhale 2 puffs into the lungs daily. 08/31/14  Yes Collene Gobble, MD  XARELTO 15 MG TABS tablet Take 15 mg by mouth daily with breakfast.  12/14/14  Yes Historical Provider, MD  predniSONE (DELTASONE) 10 MG tablet Take 4 tabs for 2 days, then 3 tabs  for 2 days, 2 tabs for 2 days, then 1 tab for 2 days, then stop. Patient not taking: Reported on 01/11/2015 12/27/14   Marshell Garfinkel, MD  predniSONE (DELTASONE) 10 MG tablet Take 4 tablets (40 mg total) by mouth daily. Patient not taking: Reported on 02/19/2015 01/13/15   Forde Dandy, MD    BP 119/69 mmHg  Pulse 78  Temp(Src) 97.9 F (36.6 C) (Oral)  Resp 23  SpO2 95% Physical Exam  Constitutional: She is oriented to person, place, and time. No distress.  Chronically ill-appearing female in no acute distress.  HENT:  Head: Normocephalic and atraumatic.  Right Ear: External ear normal.  Left Ear: External ear normal.  Nose: Nose normal.  Mouth/Throat: Uvula is midline, oropharynx is clear and moist and mucous membranes are normal.  Eyes: Conjunctivae, EOM and lids are normal. Pupils are equal, round, and reactive to light. Right eye exhibits no discharge. Left eye exhibits no discharge. No scleral icterus.  Neck: Normal range of motion. Neck supple.  Cardiovascular: Normal rate, regular rhythm, normal heart sounds, intact distal pulses and normal pulses.   Pulmonary/Chest: Effort normal and breath sounds normal. No respiratory distress.  Abdominal: Soft. Normal appearance and bowel sounds are normal. She exhibits no distension and no mass. There is no tenderness. There is no rigidity, no rebound and no guarding.  Musculoskeletal: Normal range of motion. She exhibits no edema or tenderness.  Neurological: She is alert and oriented to person, place, and time.  Patient alert, follows commands, not cooperative with cranial nerve testing or strength and sensation testing.  Skin: Skin is warm, dry and intact. No rash noted. She is not diaphoretic. No erythema. No pallor.  Psychiatric: She has a normal mood and affect. Her speech is normal and behavior is normal.  Nursing note and vitals reviewed.   ED Course  Procedures (including critical care time)  Labs Review Labs Reviewed  CBC  WITH DIFFERENTIAL/PLATELET - Abnormal; Notable for the following:    WBC 11.3 (*)    RBC 3.60 (*)    Hemoglobin 10.0 (*)    HCT 34.5 (*)    MCHC 29.0 (*)    Neutro Abs 8.3 (*)    Monocytes Absolute 1.9 (*)    All other components within normal limits  BASIC METABOLIC PANEL - Abnormal; Notable for the following:    Sodium 146 (*)    Chloride 98 (*)    CO2 35 (*)    Glucose, Bld 101 (*)    BUN 30 (*)    Creatinine, Ser 1.27 (*)    GFR calc non Af Amer 39 (*)    GFR calc Af Amer 45 (*)  All other components within normal limits  URINALYSIS, ROUTINE W REFLEX MICROSCOPIC (NOT AT [facility]) - Abnormal; Notable for the following:    APPearance TURBID (*)    Hgb urine dipstick LARGE (*)    Bilirubin Urine SMALL (*)    Ketones, ur 15 (*)    Protein, ur 100 (*)    Leukocytes, UA SMALL (*)    All other components within normal limits  URINE MICROSCOPIC-ADD ON - Abnormal; Notable for the following:    Squamous Epithelial / LPF 0-5 (*)    Bacteria, UA MANY (*)    Casts GRANULAR CAST (*)    All other components within normal limits  BLOOD GAS, ARTERIAL  I-STAT TROPOININ, ED  I-STAT CG4 LACTIC ACID, ED    Imaging Review Dg Chest 2 View  02/20/2015  CLINICAL DATA:  Altered mental status.  COPD.  The patient fell. EXAM: CHEST  2 VIEW COMPARISON:  02/19/2015 and  01/11/2015 and 12/27/2014 FINDINGS: Emphysema with diffuse chronic interstitial lung disease as well. New tiny left effusion. Pulmonary vascularity appears increased. Chronic accentuation of the thoracic kyphosis with old compression fractures. Extensive aortic atherosclerosis. IMPRESSION: Increased pulmonary vascular congestion superimposed on severe chronic lung disease. New tiny left effusion. Electronically Signed   By: Lorriane Shire M.D.   On: 02/20/2015 09:29   Dg Chest 2 View  02/19/2015  CLINICAL DATA:  Dizziness and confusion. EXAM: CHEST  2 VIEW COMPARISON:  01/11/2015 FINDINGS: The heart size is normal. Aortic  atherosclerosis noted. No pleural effusion or edema. The lungs are hyperinflated. There are coarsened interstitial markings identified bilaterally compatible with COPD/emphysema. Multi level compression deformities noted within the lower thoracic and upper lumbar spine. IMPRESSION: 1. Advanced changes of COPD/emphysema. 2. Aortic atherosclerosis 3. Age indeterminate thoracic compression deformities. Electronically Signed   By: Kerby Moors M.D.   On: 02/19/2015 09:44   Ct Head Wo Contrast  02/19/2015  CLINICAL DATA:  Fall and confusion.  Dizziness for 48 hours. EXAM: CT HEAD WITHOUT CONTRAST TECHNIQUE: Contiguous axial images were obtained from the base of the skull through the vertex without contrast. COMPARISON:  None FINDINGS: Diffuse low-density throughout the periventricular and subcortical white matter. No evidence for acute hemorrhage, mass lesion, midline shift, hydrocephalus or large infarct. Scattered areas of low-density in the cerebellum, particularly along the left cerebellar peduncle is nonspecific. Fluid in the right mastoid air cells. Mucosal thickening involving the right maxillary sinus, ethmoid air cells, right sphenoid sinus and right frontal sinus. No acute bone abnormality. IMPRESSION: No acute intracranial abnormality. Extensive white matter disease possibly representing small vessel ischemic changes. Extensive paranasal sinus disease and there is fluid in the right mastoid air cells. Electronically Signed   By: Markus Daft M.D.   On: 02/19/2015 09:49   Dg Foot Complete Left  02/19/2015  CLINICAL DATA:  Foot pain secondary to a fall. EXAM: LEFT FOOT - COMPLETE 3+ VIEW COMPARISON:  None. FINDINGS: There is no evidence of fracture or dislocation. Degenerative arthritis at the first metatarsal phalangeal joint. Soft tissues are unremarkable. Diffuse osteopenia. IMPRESSION: No acute abnormality. Electronically Signed   By: Lorriane Shire M.D.   On: 02/19/2015 09:45   Dg Foot Complete  Right  02/19/2015  CLINICAL DATA:  Dizziness and confusion x48hrs. Fall at assisted living home. Hx of dementia, COPD, Chronic Respiratory Failure, Breast surgery 1964, Hip fracture unstated laterality. Former smoker 1 pack/ day for 55 years. Bilateral foot pain. EXAM: RIGHT FOOT COMPLETE - 3+ VIEW COMPARISON:  None. FINDINGS:  Bones are diffusely demineralized. There is no fracture dislocation. There are no significant arthropathic changes. Soft tissues are unremarkable. IMPRESSION: No acute fracture or dislocation. Electronically Signed   By: Lajean Manes M.D.   On: 02/19/2015 09:44   Dg Hip Unilat With Pelvis 2-3 Views Right  02/19/2015  CLINICAL DATA:  Dizziness and confusion x48hrs. Fall at assisted living home. Hx of dementia, COPD, Chronic Respiratory Failure, Breast surgery 1964, Hip fracture unstated laterality. Former smoker 1 pack/ day for 55 years. Bilateral foot pain. EXAM: DG HIP (WITH OR WITHOUT PELVIS) 2-3V RIGHT COMPARISON:  None. FINDINGS: No acute fracture. Old right proximal femur fracture has been reduced with 2 screws. Fracture is well healed. The hip joints, SI joints and symphysis pubis are normally spaced and aligned. Bones are diffusely demineralized. Soft tissues are unremarkable. IMPRESSION: 1. No acute fracture or dislocation. Electronically Signed   By: Lajean Manes M.D.   On: 02/19/2015 09:43   I have personally reviewed and evaluated these images and lab results as part of my medical decision-making.   EKG Interpretation   Date/Time:  Sunday February 20 2015 08:04:30 EST Ventricular Rate:  85 PR Interval:  160 QRS Duration: 91 QT Interval:  370 QTC Calculation: 440 R Axis:   61 Text Interpretation:  Sinus rhythm Borderline low voltage, extremity leads  Confirmed by Lacinda Axon  MD, BRIAN ([id number]) on 02/20/2015 9:23:35 AM      MDM   Final diagnoses:  Altered mental status, unspecified altered mental status type  Weakness    81 year old female presents after  being difficult to arouse this morning by staff at her nursing facility. She was evaluated in the ED yesterday for fall and altered mental status, and family reports she seems different than her baseline and has been less responsive and weaker than usual. Patient is a poor historian.  Patient is afebrile. Mildly tachypneic. Patient is chronically ill appearing, though is in no acute distress. Mucous membranes dry. Heart regular rate and rhythm. On home 3L oxygen. No respiratory distress. Abdomen soft, nontender, nondistended. Patient alert, follows commands, not cooperative with cranial nerve testing or strength and sensation testing.  CBC remarkable for mild leukocytosis of 11.3, hemoglobin 10, which appears stable. Lactic acid within normal limits. BMP remarkable for creatinine 1.27, which appears consistent with baseline. UA with small leukocytes, 0-5 WBC on microscopic. EKG sinus rhythm, HR 85. Troponin negative.  Hospitalist consulted for admission. Spoke with hospitalist, patient to be admitted for further evaluation and management. Advised to obtain an ABG. Spoke with Education officer, museum, who advised patient being admitted for observation will not effect returning to her nursing facility on discharge from the hospital.  Patient discussed with and seen by Dr. Lacinda Axon.  BP 119/69 mmHg  Pulse 78  Temp(Src) 97.9 F (36.6 C) (Oral)  Resp 23  SpO2 95%    Marella Chimes, PA-C 02/20/15 1602  Nat Christen, MD 02/22/15 1231

## 2015-02-20 NOTE — ED Notes (Signed)
She is drowsy.  She has just been seen by Dr. Grandville Silos who has spoken with Ron Agee, R.T. Who is aware of order to bi-pap and N.T. Suction.

## 2015-02-20 NOTE — H&P (Signed)
[facility] History and Physical  [name]:[id number] DOB: 1934/03/24 DOA: 02/20/2015  Referring physician: Dr. Lacinda Axon PCP: Paula Austria, MD   Chief Complaint: Lethargy/altered mental status  HPI: Paula Contreras is a 81 y.o. female [facility] With history of chronic respiratory failure on home O2 3 L, chronic kidney disease, hypertension, atrial fibrillation on chronic anticoagulation with xarelto, aortic stenosis who was seen in the ED 1 day prior to admission for fall and subsequently discharged to the skilled nursing facility. Patient is pretty lethargic and a such history was obtained from nurse tech at nursing facility and per ED notes.  At nursing facility this morning the nurse tech noted that patient was somnolent and not responding to her name after she sat back in the recliner. It was noted that patient was shaking while she was trying to put on oxygen. It was noted that patient had a temp of 99.5 the night prior to admission, was worsening shortness of breath over the past few weeks with oxygenation with sats of 87% on 3 L nasal cannula 2 days prior to admission on the day of admission. Patient also is some generalized weakness decreased oral intake a cough and congestion. Per nurse tech patient did not have any chills, no nausea, no vomiting, no abdominal pain, no chest pain, no diarrhea, no constipation, no dysuria, no melena, no hematemesis, no hematochezia. Patient was seen in the emergency room, basic metabolic profile had a sodium of 146 chloride of 98 bicarbonate of 35 BUN of 30 creatinine of 1.7 otherwise was within normal limits. CBC obtained had a white count of 11.3 hemoglobin of 10.0 otherwise was within normal limits. Urinalysis done had small leukocytes nitrite -0-5 WBCs. [facility] were called to admit the patient for further evaluation and management.   Review of Systems: As per history of present illness  otherwise negative. Constitutional:  No weight loss, night sweats, Fevers, chills, fatigue.  HEENT:  No headaches, Difficulty swallowing,Tooth/dental problems,Sore throat,  No sneezing, itching, ear ache, nasal congestion, post nasal drip,  Cardio-vascular:  No chest pain, Orthopnea, PND, swelling in lower extremities, anasarca, dizziness, palpitations  GI:  No heartburn, indigestion, abdominal pain, nausea, vomiting, diarrhea, change in bowel habits, loss of appetite  Resp:  No shortness of breath with exertion or at rest. No excess mucus, no productive cough, No non-productive cough, No coughing up of blood.No change in color of mucus.No wheezing.No chest wall deformity  Skin:  no rash or lesions.  GU:  no dysuria, change in color of urine, no urgency or frequency. No flank pain.  Musculoskeletal:  No joint pain or swelling. No decreased range of motion. No back pain.  Psych:  No change in mood or affect. No depression or anxiety. No memory loss.   Past Medical History  Diagnosis Date  . Cataract   . Other psoriasis and similar disorders     psoriasis vs eczema  . Hypertension   . COPD (chronic obstructive pulmonary disease) ([place])   . CKD (chronic kidney disease), stage III   . Persistent atrial fibrillation ([place])     a. Dx 07/2013. No attempts per Epic to restore NSR.  Marland Kitchen Aortic stenosis     a. 2D Echo 07/2013: EF 60-65%, no RWMA, grade 1 DD, normal LV filling pressure, moderate AS, mild TR, PASP 27mHg.  .[place]     a. On home O2 since 2012.  .Marland KitchenAnemia 02/20/2015   Past Surgical  History  Procedure Laterality Date  . Cataract extraction    . Breast surgery  1964  . Hip surgery      fracture   Social History:  reports that she quit smoking about 4 years ago. Her smoking use included Cigarettes. She has a 55 pack-year smoking history. She has never used smokeless tobacco. She reports that she does not drink alcohol or use illicit  drugs.  Allergies  Allergen Reactions  . Codeine Nausea And Vomiting    Family History  Problem Relation Age of Onset  . Asthma Mother   . Asthma Maternal Grandmother   . Lung cancer Father   . Lung cancer Paternal Uncle     father's twin brother   unable to obtain family history due to patient's condition.  Prior to Admission medications   Medication Sig Start Date End Date Taking? Authorizing Provider  acetaminophen (TYLENOL) 325 MG tablet Take 650 mg by mouth every 6 (six) hours as needed (for pain).    Yes Historical Provider, MD  albuterol (PROAIR HFA) 108 (90 BASE) MCG/ACT inhaler Inhale 2 puffs into the lungs every 4 (four) hours as needed for wheezing or shortness of breath. 12/24/14  Yes Collene Gobble, MD  alendronate (FOSAMAX) 70 MG tablet Take 70 mg by mouth every Monday. Take with a full glass of water on an empty stomach.   Yes Historical Provider, MD  calcium carbonate (OS-CAL) 600 MG TABS tablet Take 2 tablets by mouth daily with breakfast.    Yes Historical Provider, MD  Cholecalciferol (VITAMIN D) 2000 UNITS CAPS Take 2,000 Units by mouth daily with breakfast.    Yes Historical Provider, MD  lactose free nutrition (BOOST) LIQD Take 237 mLs by mouth 2 (two) times daily between meals.   Yes Historical Provider, MD  OXYGEN Inhale 2-3 L into the lungs continuous. Change to 3L when on portable   Yes Historical Provider, MD  Tiotropium Bromide Monohydrate (SPIRIVA RESPIMAT) 1.25 MCG/ACT AERS Inhale 2 puffs into the lungs daily. 08/31/14  Yes Collene Gobble, MD  XARELTO 15 MG TABS tablet Take 15 mg by mouth daily with breakfast.  12/14/14  Yes Historical Provider, MD  predniSONE (DELTASONE) 10 MG tablet Take 4 tabs for 2 days, then 3 tabs for 2 days, 2 tabs for 2 days, then 1 tab for 2 days, then stop. Patient not taking: Reported on 01/11/2015 12/27/14   Marshell Garfinkel, MD  predniSONE (DELTASONE) 10 MG tablet Take 4 tablets (40 mg total) by mouth daily. Patient not taking:  Reported on 02/19/2015 01/13/15   Forde Dandy, MD   Physical Exam: Filed Vitals:   02/20/15 0807 02/20/15 1100 02/20/15 1118  BP: 135/66 119/69   Pulse: 80 78   Temp: 97.9 F (36.6 C)  98.9 F (37.2 C)  TempSrc: Oral  Rectal  Resp: 22 23   SpO2: 95% 95%     Wt Readings from Last 3 Encounters:  02/19/15 58.968 kg (130 lb)  12/27/14 60.691 kg (133 lb 12.8 oz)  12/01/14 60.782 kg (134 lb)    General:  Frail elderly female lying on gurney with occasional cough and congestion noted. Patient is lethargic and somewhat somnolent will print to proposed However will drop back to sleep.  Eyes: PERRLA, EOMI, normal lids, irises & conjunctiva ENT: grossly normal hearing, lips & tongue. Significantly dry mucous membranes. Neck: no LAD, masses or thyromegaly Cardiovascular: RRR, no m/r/g. No LE edema. Respiratory: Some coarse rhonchorous breath sounds noted in the  anterior lung fields. No wheezing. Some use of accessory muscles of respiration. Poor air movement. Abdomen: soft, ntnd, positive bowel sounds, no rebound, no guarding. Skin: no rash or induration seen on limited exam Musculoskeletal: grossly normal tone BUE/BLE Psychiatric: Unable to assess secondary to Mental status.  Neurologic: Unable to assess secondary to current mental status. Patient however moving all extremities spontaneously.           Labs on Admission:  Basic Metabolic Panel:  Recent Labs Lab 02/19/15 0856 02/20/15 0915  NA 141 146*  K 4.3 4.5  CL 94* 98*  CO2 37* 35*  GLUCOSE 102* 101*  BUN 25* 30*  CREATININE 1.17* 1.27*  CALCIUM 10.2 9.6   Liver Function Tests:  Recent Labs Lab 02/19/15 0856  AST 29  ALT 41  ALKPHOS 121  BILITOT 0.4  PROT 7.7  ALBUMIN 3.3*   No results for input(s): LIPASE, AMYLASE in the last 168 hours. No results for input(s): AMMONIA in the last 168 hours. CBC:  Recent Labs Lab 02/19/15 0856 02/20/15 0915  WBC 9.5 11.3*  NEUTROABS 7.1 8.3*  HGB 9.0* 10.0*  HCT  31.8* 34.5*  MCV 96.1 95.8  PLT 249 276   Cardiac Enzymes: No results for input(s): CKTOTAL, CKMB, CKMBINDEX, TROPONINI in the last 168 hours.  BNP (last 3 results) No results for input(s): BNP in the last 8760 hours.  ProBNP (last 3 results) No results for input(s): PROBNP in the last 8760 hours.  CBG:  Recent Labs Lab 02/19/15 0827  GLUCAP 92    Radiological Exams on Admission: Dg Chest 2 View  02/20/2015  CLINICAL DATA:  Altered mental status.  COPD.  The patient fell. EXAM: CHEST  2 VIEW COMPARISON:  02/19/2015 and  01/11/2015 and 12/27/2014 FINDINGS: Emphysema with diffuse chronic interstitial lung disease as well. New tiny left effusion. Pulmonary vascularity appears increased. Chronic accentuation of the thoracic kyphosis with old compression fractures. Extensive aortic atherosclerosis. IMPRESSION: Increased pulmonary vascular congestion superimposed on severe chronic lung disease. New tiny left effusion. Electronically Signed   By: Lorriane Shire M.D.   On: 02/20/2015 09:29   Dg Chest 2 View  02/19/2015  CLINICAL DATA:  Dizziness and confusion. EXAM: CHEST  2 VIEW COMPARISON:  01/11/2015 FINDINGS: The heart size is normal. Aortic atherosclerosis noted. No pleural effusion or edema. The lungs are hyperinflated. There are coarsened interstitial markings identified bilaterally compatible with COPD/emphysema. Multi level compression deformities noted within the lower thoracic and upper lumbar spine. IMPRESSION: 1. Advanced changes of COPD/emphysema. 2. Aortic atherosclerosis 3. Age indeterminate thoracic compression deformities. Electronically Signed   By: Kerby Moors M.D.   On: 02/19/2015 09:44   Ct Head Wo Contrast  02/19/2015  CLINICAL DATA:  Fall and confusion.  Dizziness for 48 hours. EXAM: CT HEAD WITHOUT CONTRAST TECHNIQUE: Contiguous axial images were obtained from the base of the skull through the vertex without contrast. COMPARISON:  None FINDINGS: Diffuse low-density  throughout the periventricular and subcortical white matter. No evidence for acute hemorrhage, mass lesion, midline shift, hydrocephalus or large infarct. Scattered areas of low-density in the cerebellum, particularly along the left cerebellar peduncle is nonspecific. Fluid in the right mastoid air cells. Mucosal thickening involving the right maxillary sinus, ethmoid air cells, right sphenoid sinus and right frontal sinus. No acute bone abnormality. IMPRESSION: No acute intracranial abnormality. Extensive white matter disease possibly representing small vessel ischemic changes. Extensive paranasal sinus disease and there is fluid in the right mastoid air cells. Electronically Signed  By: Markus Daft M.D.   On: 02/19/2015 09:49   Dg Foot Complete Left  02/19/2015  CLINICAL DATA:  Foot pain secondary to a fall. EXAM: LEFT FOOT - COMPLETE 3+ VIEW COMPARISON:  None. FINDINGS: There is no evidence of fracture or dislocation. Degenerative arthritis at the first metatarsal phalangeal joint. Soft tissues are unremarkable. Diffuse osteopenia. IMPRESSION: No acute abnormality. Electronically Signed   By: Lorriane Shire M.D.   On: 02/19/2015 09:45   Dg Foot Complete Right  02/19/2015  CLINICAL DATA:  Dizziness and confusion x48hrs. Fall at assisted living home. Hx of dementia, COPD, Chronic Respiratory Failure, Breast surgery 1964, Hip fracture unstated laterality. Former smoker 1 pack/ day for 55 years. Bilateral foot pain. EXAM: RIGHT FOOT COMPLETE - 3+ VIEW COMPARISON:  None. FINDINGS: Bones are diffusely demineralized. There is no fracture dislocation. There are no significant arthropathic changes. Soft tissues are unremarkable. IMPRESSION: No acute fracture or dislocation. Electronically Signed   By: Lajean Manes M.D.   On: 02/19/2015 09:44   Dg Hip Unilat With Pelvis 2-3 Views Right  02/19/2015  CLINICAL DATA:  Dizziness and confusion x48hrs. Fall at assisted living home. Hx of dementia, COPD, Chronic  Respiratory Failure, Breast surgery 1964, Hip fracture unstated laterality. Former smoker 1 pack/ day for 55 years. Bilateral foot pain. EXAM: DG HIP (WITH OR WITHOUT PELVIS) 2-3V RIGHT COMPARISON:  None. FINDINGS: No acute fracture. Old right proximal femur fracture has been reduced with 2 screws. Fracture is well healed. The hip joints, SI joints and symphysis pubis are normally spaced and aligned. Bones are diffusely demineralized. Soft tissues are unremarkable. IMPRESSION: 1. No acute fracture or dislocation. Electronically Signed   By: Lajean Manes M.D.   On: 02/19/2015 09:43    EKG: Independently reviewed. Normal sinus rhythm with Q waves in leads 2 and 3.  Assessment/Plan Principal Problem:   Acute on chronic respiratory failure with hypercapnia (HCC) Active Problems:   Hypertension   COPD (chronic obstructive pulmonary disease) (HCC)   Chronic respiratory failure (HCC)   Aortic stenosis   PAF (paroxysmal atrial fibrillation) (HCC)   CKD (chronic kidney disease), stage III   Altered mental status   Acute encephalopathy   Weakness generalized   Dehydration   Leukocytosis   Anemia   COPD exacerbation (HCC)   COPD with acute exacerbation (HCC)   Bronchitis   Fall at nursing home  #1 acute on chronic respiratory failure with hypercapnia likely secondary to copd exac with purulent bronchitis Patient presenting with lethargy, somnolence, acute mental status changes. ABG obtained shows a respiratory acidosis with a pH of 7.2/PCO2 87/02 94% with a bicarbonate of 33. Patient also noted to sound congested and have a cough on examination. Patient was noted to also be hypoxic on a home regimen of 3 L nasal cannula at the facility. Will admit the patient to the step down unit. Will panculture. Check an influenza panel PCR. Place on IV steroids, scheduled nebulizers, chest PT, nasotracheal suctioning, Mucomyst, IV antibiotics, Tamiflu, BiPAP. Repeat chest x-ray in the morning. Will consult with  critical care medicine for further evaluation and management.  #2 acute encephalopathy Likely secondary to problem #1. CT head negative for any acute abnormalities. See problem #1.  #3 chronic kidney disease stage III Stable. Follow.  #4 paroxysmal atrial fibrillation Currently rate controlled. Patient with a history of recent falls. May need to consider whether patient needs to be on anticoagulation due to risk of falls. May need to be deferred to  PCP. Xarelto once patient is more alert and tolerating oral intake.  #5 dehydration IV fluids.  #6 acute COPD with an acute exacerbation with purulent bronchitis Patient noted to be in the respiratory acidosis and noted to have a cough with congestion and altered mental status. Patient with poor air movement. We'll place on IV steroids, scheduled nebulizers, Mucomyst, IV antibiotics. BiPAP. Follow.  #7 leukocytosis Chest x-ray negative for any acute infiltrates. Urinalysis has small leukocytes nitrite negative. Check a urine culture. Repeat chest x-ray in the morning. Will place patient empirically on IV Levaquin. Follow.  #8 hypertension BP stable. Hold antihypertensive.  #9 falls PT/OT. Patient continues to have falls patient need to discuss with PCP in terms of discontinuing long-term anticoagulation.  #10 prophylaxis Lovenox for DVT prophylaxis.   Code Status: DO NOT RESUSCITATE DVT Prophylaxis: Lovenox Family Communication: Updated patient and sister-in-law at bedside. Disposition Plan: Admit to stepdown unit.  Time spent: [facility] Pager [phone]

## 2015-02-20 NOTE — ED Notes (Signed)
I have just returned from taking her to ICU.  Chest P.T. And N.T. Suctioning performed by R.T. Before transport.

## 2015-02-20 NOTE — Progress Notes (Signed)
Patient came from ED and there was an order for BP with patient standing.  Patient is not communicating currently, she is on Bipap, and deferred doing standing BP's as ordered.  Continue to monitor patient closely.  Novi Calia Roselie Awkward RN

## 2015-02-20 NOTE — Consult Note (Signed)
PULMONARY / CRITICAL CARE MEDICINE   Name: Paula Contreras MRN: [id number] DOB: Jan 03, 1934    ADMISSION DATE:  02/20/2015 CONSULTATION DATE:  02/20/2015  REFERRING MD :  Dr. Grandville Silos  CHIEF COMPLAINT:  Short of breath  HISTORY OF PRESENT ILLNESS:   81 yo female with hx of dementia and lives at [facility] found somnolent.  She was transferred to [facility] ER.  She was noted to have acute on chronic hypercapnia.  She was started on BiPAP.  She is DNR/DNI.  She has been followed in pulmonary office by Dr. Lamonte Sakai.  She wears 3 liters oxygen at 24/7.  She was seen in ER on 11/19 after falling.  She has been on xarelto for hx of A fib.  CT head from 11/19 was negative.  She was given fluid bolus and discharged back to assisted living center.  Pt non verbal and not able to provide history.  PAST MEDICAL HISTORY :  She  has a past medical history of Cataract; Other psoriasis and similar disorders; Hypertension; COPD (chronic obstructive pulmonary disease) ([place]); CKD (chronic kidney disease), stage III; Persistent atrial fibrillation ([place]); Aortic stenosis; Chronic respiratory failure ([place]); and Anemia (02/20/2015).  PAST SURGICAL HISTORY: She  has past surgical history that includes Cataract extraction; Breast surgery (1964); and Hip surgery.  Allergies  Allergen Reactions  . Codeine Nausea And Vomiting    No current facility-administered medications on file prior to encounter.   Current Outpatient Prescriptions on File Prior to Encounter  Medication Sig  . acetaminophen (TYLENOL) 325 MG tablet Take 650 mg by mouth every 6 (six) hours as needed (for pain).   Marland Kitchen albuterol (PROAIR HFA) 108 (90 BASE) MCG/ACT inhaler Inhale 2 puffs into the lungs every 4 (four) hours as needed for wheezing or shortness of breath.  Marland Kitchen alendronate (FOSAMAX) 70 MG tablet Take 70 mg by mouth every Monday. Take with a full glass of water on an empty stomach.  . calcium carbonate (OS-CAL) 600 MG TABS tablet Take  2 tablets by mouth daily with breakfast.   . Cholecalciferol (VITAMIN D) 2000 UNITS CAPS Take 2,000 Units by mouth daily with breakfast.   . lactose free nutrition (BOOST) LIQD Take 237 mLs by mouth 2 (two) times daily between meals.  . OXYGEN Inhale 2-3 L into the lungs continuous. Change to 3L when on portable  . Tiotropium Bromide Monohydrate (SPIRIVA RESPIMAT) 1.25 MCG/ACT AERS Inhale 2 puffs into the lungs daily.  [name] 15 MG TABS tablet Take 15 mg by mouth daily with breakfast.   . predniSONE (DELTASONE) 10 MG tablet Take 4 tabs for 2 days, then 3 tabs for 2 days, 2 tabs for 2 days, then 1 tab for 2 days, then stop. (Patient not taking: Reported on 01/11/2015)  . predniSONE (DELTASONE) 10 MG tablet Take 4 tablets (40 mg total) by mouth daily. (Patient not taking: Reported on 02/19/2015)    FAMILY HISTORY:  Her indicated that her mother is deceased. She indicated that her father is deceased.   SOCIAL HISTORY: She  reports that she quit smoking about 4 years ago. Her smoking use included Cigarettes. She has a 55 pack-year smoking history. She has never used smokeless tobacco. She reports that she does not drink alcohol or use illicit drugs.  REVIEW OF SYSTEMS:   Unable to obtain.  SUBJECTIVE:   VITAL SIGNS: BP 106/49 mmHg  Pulse 69  Temp(Src) 97.3 F (36.3 C) (Axillary)  Resp 21  Ht '5\' 5"'$  (1.651 m)  Wt 134 lb 4.2 oz (60.9 kg)  BMI 22.34 kg/m2  SpO2 95%  INTAKE / OUTPUT:    PHYSICAL EXAMINATION: General:  Ill appearing Neuro:  Obtunded.  Opens eyes with stimulation.  Withdraws to painful stimuli. HEENT:  Pupils reactive, BiPAP mask in place Cardiovascular:  Irregular, 2/6 systolic murmur Lungs:  Poor air movement, no wheeze Abdomen:  Soft, non tender Musculoskeletal:  No edema Skin:  No rashes  LABS:  CBC  Recent Labs Lab 02/19/15 0856 02/20/15 0915  WBC 9.5 11.3*  HGB 9.0* 10.0*  HCT 31.8* 34.5*  PLT 249 276   Coag's No results for input(s): APTT,  INR in the last 168 hours.   BMET  Recent Labs Lab 02/19/15 0856 02/20/15 0915  NA 141 146*  K 4.3 4.5  CL 94* 98*  CO2 37* 35*  BUN 25* 30*  CREATININE 1.17* 1.27*  GLUCOSE 102* 101*   Electrolytes  Recent Labs Lab 02/19/15 0856 02/20/15 0915  CALCIUM 10.2 9.6   Sepsis Markers  Recent Labs Lab 02/19/15 0909 02/19/15 1217 02/20/15 0951  LATICACIDVEN 0.54 0.80 1.26   ABG  Recent Labs Lab 02/20/15 1055  PHART 7.209*  PCO2ART 87.3*  PO2ART 94.0   Liver Enzymes  Recent Labs Lab 02/19/15 0856  AST 29  ALT 41  ALKPHOS 121  BILITOT 0.4  ALBUMIN 3.3*   Cardiac Enzymes No results for input(s): TROPONINI, PROBNP in the last 168 hours.   Glucose  Recent Labs Lab 02/19/15 0827  GLUCAP 92    Imaging Dg Chest 2 View  02/20/2015  CLINICAL DATA:  Altered mental status.  COPD.  The patient fell. EXAM: CHEST  2 VIEW COMPARISON:  02/19/2015 and  01/11/2015 and 12/27/2014 FINDINGS: Emphysema with diffuse chronic interstitial lung disease as well. New tiny left effusion. Pulmonary vascularity appears increased. Chronic accentuation of the thoracic kyphosis with old compression fractures. Extensive aortic atherosclerosis. IMPRESSION: Increased pulmonary vascular congestion superimposed on severe chronic lung disease. New tiny left effusion. Electronically Signed   By: Lorriane Shire M.D.   On: 02/20/2015 09:29     STUDIES:   CULTURES: Urine  ANTIBIOTICS: Levaquin 11/20 >> Tamiflu 11/20 >>  SIGNIFICANT EVENTS: 11/20 Admit  LINES/TUBES:  DISCUSSION: 81 yo female admitted with altered mental status from acute on chronic hypoxic/hypercapnic respiratory failure.  She has hx of severe COPD on home oxygen, dementia, a fib on xarelto.  She is DNR/DNI.  ASSESSMENT / PLAN:  Acute on chronic hypoxic/hypercapnic respiratory failure. AECOPD. Plan: - BiPAP prn - oxygen to keep SpO2 88 to 95% - f/u CXR as needed - limit ABGs - continue atrovent, xopenex -  continue solumedrol - day 1 levaquin, tamiflu  Acute encephalopathy 2nd to respiratory failure. Hx of dementia >> not sure what baseline functional status is. Plan: - monitor mental status  Permanent A fib on xarelto as outpt. Plan: - might be better to avoid DOAC in this setting with concern for falls >> defer to primary team  Goals of Care >> DNR/DNI.  Chesley Mires, MD [facility] Pulmonary/Critical Care 02/20/2015, 2:53 PM Pager:  [phone] After 3pm call: [phone]

## 2015-02-20 NOTE — Progress Notes (Signed)
PT Cancellation Note  Patient Details Name: Paula Contreras MRN: [id number] DOB: [birth year]   Cancelled Treatment:    Reason Eval/Treat Not Completed: Medical issues which prohibited therapy. Will hold PT and check back another day. Thanks.    Weston Anna, MPT Pager: [phone]

## 2015-02-20 NOTE — ED Notes (Signed)
Staff at [facility] found pt. To be difficult to arouse.  EMS found pt. To be awake, alert and in no distress.  She arrives with con't. O2; which she is on full-time d/t COPD.  She continues to be in no distress.

## 2015-02-21 ENCOUNTER — Inpatient Hospital Stay (HOSPITAL_COMMUNITY): Payer: Medicare Other

## 2015-02-21 DIAGNOSIS — E870 Hyperosmolality and hypernatremia: Secondary | ICD-10-CM

## 2015-02-21 DIAGNOSIS — W19XXXD Unspecified fall, subsequent encounter: Secondary | ICD-10-CM

## 2015-02-21 DIAGNOSIS — J40 Bronchitis, not specified as acute or chronic: Secondary | ICD-10-CM

## 2015-02-21 DIAGNOSIS — D649 Anemia, unspecified: Secondary | ICD-10-CM

## 2015-02-21 LAB — CBC
HCT: 29 % — ABNORMAL LOW (ref 36.0–46.0)
Hemoglobin: 8.1 g/dL — ABNORMAL LOW (ref 12.0–15.0)
MCH: 27.1 pg (ref 26.0–34.0)
MCHC: 27.9 g/dL — ABNORMAL LOW (ref 30.0–36.0)
MCV: 97 fL (ref 78.0–100.0)
PLATELETS: 255 10*3/uL (ref 150–400)
RBC: 2.99 MIL/uL — ABNORMAL LOW (ref 3.87–5.11)
RDW: 14.9 % (ref 11.5–15.5)
WBC: 7.2 10*3/uL (ref 4.0–10.5)

## 2015-02-21 LAB — BASIC METABOLIC PANEL
Anion gap: 11 (ref 5–15)
BUN: 39 mg/dL — AB (ref 6–20)
CALCIUM: 8.7 mg/dL — AB (ref 8.9–10.3)
CHLORIDE: 102 mmol/L (ref 101–111)
CO2: 35 mmol/L — ABNORMAL HIGH (ref 22–32)
CREATININE: 1.37 mg/dL — AB (ref 0.44–1.00)
GFR calc Af Amer: 41 mL/min — ABNORMAL LOW (ref >60)
GFR calc non Af Amer: 36 mL/min — ABNORMAL LOW (ref >60)
Glucose, Bld: 128 mg/dL — ABNORMAL HIGH (ref 65–99)
Potassium: 4 mmol/L (ref 3.5–5.1)
SODIUM: 148 mmol/L — AB (ref 135–145)

## 2015-02-21 LAB — IRON AND TIBC
Iron: 47 ug/dL (ref 28–170)
Saturation Ratios: 26 % (ref 10.4–31.8)
TIBC: 183 ug/dL — ABNORMAL LOW (ref 250–450)
UIBC: 136 ug/dL

## 2015-02-21 LAB — URINE CULTURE

## 2015-02-21 LAB — RETICULOCYTES
RBC.: 3 MIL/uL — AB (ref 3.87–5.11)
RETIC CT PCT: 1.2 % (ref 0.4–3.1)
Retic Count, Absolute: 36 10*3/uL (ref 19.0–186.0)

## 2015-02-21 LAB — FOLATE: Folate: 30.9 ng/mL (ref >5.9)

## 2015-02-21 LAB — FERRITIN: FERRITIN: 636 ng/mL — AB (ref 11–307)

## 2015-02-21 LAB — VITAMIN B12: VITAMIN B 12: 537 pg/mL (ref 180–914)

## 2015-02-21 MED ORDER — IPRATROPIUM BROMIDE 0.02 % IN SOLN
0.5000 mg | Freq: Four times a day (QID) | RESPIRATORY_TRACT | Status: DC
  Administered 2015-02-22 – 2015-02-23 (×6): 0.5 mg via RESPIRATORY_TRACT
  Filled 2015-02-21 (×6): qty 2.5

## 2015-02-21 MED ORDER — CHLORHEXIDINE GLUCONATE 0.12 % MT SOLN
15.0000 mL | Freq: Two times a day (BID) | OROMUCOSAL | Status: DC
  Administered 2015-02-21 – 2015-02-23 (×4): 15 mL via OROMUCOSAL
  Filled 2015-02-21 (×4): qty 15

## 2015-02-21 MED ORDER — LEVALBUTEROL HCL 0.63 MG/3ML IN NEBU
0.6300 mg | INHALATION_SOLUTION | Freq: Four times a day (QID) | RESPIRATORY_TRACT | Status: DC
  Administered 2015-02-22 – 2015-02-23 (×6): 0.63 mg via RESPIRATORY_TRACT
  Filled 2015-02-21 (×6): qty 3

## 2015-02-21 MED ORDER — METHYLPREDNISOLONE SODIUM SUCC 40 MG IJ SOLR
40.0000 mg | Freq: Two times a day (BID) | INTRAMUSCULAR | Status: DC
  Administered 2015-02-22: 40 mg via INTRAVENOUS
  Filled 2015-02-21: qty 1

## 2015-02-21 MED ORDER — CETYLPYRIDINIUM CHLORIDE 0.05 % MT LIQD
7.0000 mL | Freq: Two times a day (BID) | OROMUCOSAL | Status: DC
  Administered 2015-02-21 – 2015-02-23 (×4): 7 mL via OROMUCOSAL

## 2015-02-21 MED ORDER — DEXTROSE 5 % IV SOLN
INTRAVENOUS | Status: DC
  Administered 2015-02-21 – 2015-02-22 (×2): via INTRAVENOUS
  Filled 2015-02-21 (×2): qty 1000

## 2015-02-21 MED ORDER — LIP MEDEX EX OINT
TOPICAL_OINTMENT | CUTANEOUS | Status: AC
  Administered 2015-02-21: 1
  Filled 2015-02-21: qty 7

## 2015-02-21 NOTE — NC FL2 (Signed)
Paula Contreras LEVEL OF CARE SCREENING TOOL     IDENTIFICATION  Patient Name: Paula Contreras Birthdate: 1933/11/10 Sex: female Admission Date (Current Location): 02/20/2015  [facility] and [place] Number: Company secretary and Address:         Provider Number: [id number]  Attending Physician Name and Address:  Eugenie Filler, MD  Relative Name and Phone Number:       Current Level of Care: Hospital Recommended Level of Care: [place] Prior Approval Number:    Date Approved/Denied:   PASRR Number:    Discharge Plan: SNF    Current Diagnoses: Patient Active Problem List   Diagnosis Date Noted  . Hypernatremia 02/21/2015  . Altered mental status 02/20/2015  . Acute encephalopathy 02/20/2015  . Weakness generalized 02/20/2015  . Dehydration 02/20/2015  . Leukocytosis 02/20/2015  . Anemia 02/20/2015  . COPD exacerbation ([place]) 02/20/2015  . Acute on chronic respiratory failure with hypercapnia ([place]) 02/20/2015  . COPD with acute exacerbation ([place]) 02/20/2015  . Bronchitis 02/20/2015  . Fall at nursing home 02/20/2015  . Bradycardia 04/09/2014  . Chronic respiratory failure ([place])   . Aortic stenosis   . PAF (paroxysmal atrial fibrillation) ([place])   . CKD (chronic kidney disease), stage III   . Aortic valve disorder 10/30/2013  . Undiagnosed cardiac murmurs 07/21/2013  . Hypertension 10/02/2010  . COPD (chronic obstructive pulmonary disease) ([place]) 10/02/2010    Orientation ACTIVITIES/SOCIAL BLADDER RESPIRATION         Incontinent O2 (As needed) (4L)  BEHAVIORAL SYMPTOMS/MOOD NEUROLOGICAL BOWEL NUTRITION STATUS      Continent Diet (DYS 3)  PHYSICIAN VISITS COMMUNICATION OF NEEDS Height & Weight Skin        136 lbs. Other (Comment) (Skin tear Left Arm )          AMBULATORY STATUS RESPIRATION      O2 (As needed) (4L)      Personal Care Assistance Level of Assistance  Bathing, Dressing Bathing Assistance: Limited assistance   Dressing Assistance: Limited assistance      Functional Limitations Info                SPECIAL CARE FACTORS FREQUENCY                      Additional Factors Info                  Current Medications (02/21/2015): Current Facility-Administered Medications  Medication Dose Route Frequency Provider Last Rate Last Dose  . acetaminophen (TYLENOL) tablet 650 mg  650 mg Oral Q6H PRN Eugenie Filler, MD       Or  . acetaminophen (TYLENOL) suppository 650 mg  650 mg Rectal Q6H PRN Eugenie Filler, MD      . alum & mag hydroxide-simeth (MAALOX/MYLANTA) 200-200-20 MG/5ML suspension 30 mL  30 mL Oral Q6H PRN Irine Seal V, MD      . dextrose 5 % 1,000 mL infusion   Intravenous Continuous Irine Seal V, MD      . enoxaparin (LOVENOX) injection 30 mg  30 mg Subcutaneous Q24H Eugenie Filler, MD   30 mg at 02/20/15 1702  . ipratropium (ATROVENT) nebulizer solution 0.5 mg  0.5 mg Nebulization Q6H Eugenie Filler, MD   0.5 mg at 02/21/15 0811  . ipratropium (ATROVENT) nebulizer solution 0.5 mg  0.5 mg Nebulization Q2H PRN Eugenie Filler, MD      . levalbuterol Penne Lash) nebulizer  solution 0.63 mg  0.63 mg Nebulization Q6H Eugenie Filler, MD   0.63 mg at 02/21/15 9449  . levalbuterol (XOPENEX) nebulizer solution 0.63 mg  0.63 mg Nebulization Q2H PRN Eugenie Filler, MD      . Levofloxacin (LEVAQUIN) IVPB 250 mg  250 mg Intravenous Q24H Irine Seal V, MD      . methylPREDNISolone sodium succinate (SOLU-MEDROL) 40 mg/mL injection 40 mg  40 mg Intravenous Q6H Chesley Mires, MD   40 mg at 02/21/15 0559  . ondansetron (ZOFRAN) injection 4 mg  4 mg Intravenous Q6H PRN Eugenie Filler, MD      . oseltamivir (TAMIFLU) 6 MG/ML suspension 30 mg  30 mg Oral BID Eugenie Filler, MD   30 mg at 02/20/15 1501  . senna-docusate (Senokot-S) tablet 1 tablet  1 tablet Oral QHS PRN Irine Seal V, MD      . sodium chloride 0.9 % injection 3 mL  3 mL Intravenous Q12H  Eugenie Filler, MD   3 mL at 02/20/15 1330  . sorbitol 70 % solution 30 mL  30 mL Oral Daily PRN Eugenie Filler, MD       Do not use this list as official medication orders. Please verify with discharge summary.  Discharge Medications:   Medication List    ASK your doctor about these medications        acetaminophen 325 MG tablet  Commonly known as:  TYLENOL  Take 650 mg by mouth every 6 (six) hours as needed (for pain).     albuterol 108 (90 BASE) MCG/ACT inhaler  Commonly known as:  PROAIR HFA  Inhale 2 puffs into the lungs every 4 (four) hours as needed for wheezing or shortness of breath.     alendronate 70 MG tablet  Commonly known as:  FOSAMAX  Take 70 mg by mouth every Monday. Take with a full glass of water on an empty stomach.     calcium carbonate 600 MG Tabs tablet  Commonly known as:  OS-CAL  Take 2 tablets by mouth daily with breakfast.     lactose free nutrition Liqd  Take 237 mLs by mouth 2 (two) times daily between meals.     OXYGEN  Inhale 2-3 L into the lungs continuous. Change to 3L when on portable     predniSONE 10 MG tablet  Commonly known as:  DELTASONE  Take 4 tabs for 2 days, then 3 tabs for 2 days, 2 tabs for 2 days, then 1 tab for 2 days, then stop.     predniSONE 10 MG tablet  Commonly known as:  DELTASONE  Take 4 tablets (40 mg total) by mouth daily.     Tiotropium Bromide Monohydrate 1.25 MCG/ACT Aers  Commonly known as:  SPIRIVA RESPIMAT  Inhale 2 puffs into the lungs daily.     Vitamin D 2000 UNITS Caps  Take 2,000 Units by mouth daily with breakfast.     XARELTO 15 MG Tabs tablet  Generic drug:  Rivaroxaban  Take 15 mg by mouth daily with breakfast.        Relevant Imaging Results:  Relevant Lab Results:  Recent Labs    Additional Information SSN  [id number]  [name]

## 2015-02-21 NOTE — Evaluation (Signed)
Occupational Therapy Evaluation Patient Details Name: Paula Contreras MRN: [id number] DOB: Feb 18, 1934 Today's Date: 02/21/2015    History of Present Illness 81 yo old female admitted from ALF with COPD exacerbation; hx of dementia, HTN, CKD, atrial fibrillation on chronic anticoagulation with xarelto, and aortic stenosis.    Clinical Impression   Pt admitted with COPD exacerbation. Pt currently with functional limitations due to the deficits listed below (see OT Problem List).  Pt will benefit from skilled OT to increase their safety and independence with ADL and functional mobility for ADL to facilitate discharge to venue listed below.      Follow Up Recommendations  SNF    Equipment Recommendations  None recommended by OT    Recommendations for Other Services       Precautions / Restrictions Precautions Precautions: Fall Precaution Comments: monitor sats Restrictions Weight Bearing Restrictions: No      Mobility Bed Mobility Overal bed mobility: Needs Assistance Bed Mobility: Supine to Sit     Supine to sit: Mod assist     General bed mobility comments: pt sitting in chair  Transfers Overall transfer level: Needs assistance Equipment used: Rolling walker (2 wheeled) Transfers: Stand Pivot Transfers;Sit to/from Stand Sit to Stand: Mod assist Stand pivot transfers: Mod assist       General transfer comment: pt declined standing         ADL Overall ADL's : Needs assistance/impaired Eating/Feeding: Minimal assistance;Sitting   Grooming: Minimal assistance;Sitting                                 General ADL Comments: pt declined standing at this time but was sliding forward in the chair.  OT assisted pt scoot back in chair and placed chair under her feet               Pertinent Vitals/Pain Pain Assessment: No/denies pain        Extremity/Trunk Assessment Upper Extremity Assessment Upper Extremity Assessment: Generalized  weakness   Lower Extremity Assessment Lower Extremity Assessment: Generalized weakness   Cervical / Trunk Assessment Cervical / Trunk Assessment: Normal   Communication Communication Communication: HOH   Cognition Arousal/Alertness: Awake/alert Behavior During Therapy: Anxious (Pt was fearful of getting oob, was afraid to fall) Overall Cognitive Status: Within Functional Limits for tasks assessed                                Home Living Family/patient expects to be discharged to:: Assisted living                                        Prior Functioning/Environment Level of Independence: Needs assistance    ADL's / Homemaking Assistance Needed: pt states they help her bath and she prefers a shower   Comments: ambulates only to the bathroom using RW, uses WC to go to dining services. Pt states she mostly just watches TV all day.     OT Diagnosis: Generalized weakness   OT Problem List: Decreased strength;Decreased activity tolerance;Decreased safety awareness   OT Treatment/Interventions: Self-care/ADL training;DME and/or AE instruction;Patient/family education    OT Goals(Current goals can be found in the care plan section) Acute Rehab OT Goals Patient Stated Goal: to feel better OT Goal Formulation: With patient Time For Goal Achievement:  03/07/15  OT Frequency: Min 2X/week              End of Session Nurse Communication: Mobility status  Activity Tolerance: Patient limited by fatigue Patient left: in chair;with call bell/phone within reach   Time: 1425-1440 OT Time Calculation (min): 15 min Charges:  OT General Charges $OT Visit: 1 Procedure OT Evaluation $Initial OT Evaluation Tier I: 1 Procedure G-Codes:    Betsy Pries February 26, 2015, 4:03 PM

## 2015-02-21 NOTE — Clinical Documentation Improvement (Signed)
Internal Medicine  Can the diagnosis of Acute Encephalopathy be further specified? Please update your documentation within the medical record to reflect your response to this query. Do not document in BPA drop down Box; enter findings on progress note. Thank you   Metabolic Encephalopathy  Other Type - please identify  Clinically Undetermined  Supporting Information:  Hypernatremic  Acute on Chronic Hypoxic/Hypercarbic Respiratory Failure with Respiratory Acidosis  Please exercise your independent, professional judgment when responding. A specific answer is not anticipated or expected.  Thank You, Zoila Shutter RN, [place] [phone]: Cell: [phone]

## 2015-02-21 NOTE — Evaluation (Signed)
Physical Therapy Evaluation Patient Details Name: Paula Contreras MRN: [id number] DOB: 1934-02-03 Today's Date: 02/21/2015   History of Present Illness  81 yo old female admitted from ALF with COPD exacerbation; hx of dementia, HTN, CKD, atrial fibrillation on chronic anticoagulation with xarelto, and aortic stenosis.   Clinical Impression  Pt admitted with above diagnosis. Pt currently with functional limitations due to the deficits listed below (see PT Problem List). Pt will benefit from skilled PT to increase their independence and safety with mobility to allow discharge to the venue listed below. Pt is anxious about ambulation and transferring, requires mod assist to transfer from bed to recliner. Recommend HHPT if ALF can provide current level of assist, if not, would benefit from SNF.      Follow Up Recommendations SNF;Supervision for mobility/OOB    Equipment Recommendations  None recommended by PT    Recommendations for Other Services       Precautions / Restrictions Precautions Precautions: Fall Precaution Comments: monitor sats Restrictions Weight Bearing Restrictions: No      Mobility  Bed Mobility Overal bed mobility: Needs Assistance Bed Mobility: Supine to Sit     Supine to sit: Mod assist     General bed mobility comments: multiple cues for using both UEs to self-assist with trunk, pt is fearful and anxious to move; mod assist with trunk and LEs  Transfers Overall transfer level: Needs assistance Equipment used: Rolling walker (2 wheeled) Transfers: Stand Pivot Transfers;Sit to/from Stand Sit to Stand: Mod assist Stand pivot transfers: Mod assist       General transfer comment: assist for ascend and descend; multiple cues for stepping forward, pt stated that she was falling and therapist and student (2 people) provided mod assist to maintain pt standing while bringing recliner behind her  Ambulation/Gait             General Gait Details: Pt  declined ambulation today  Stairs            Wheelchair Mobility    Modified Rankin (Stroke Patients Only)       Balance                                             Pertinent Vitals/Pain Pain Assessment: No/denies pain    Home Living Family/patient expects to be discharged to:: Assisted living                      Prior Function Level of Independence: Needs assistance         Comments: ambulates only to the bathroom using RW, uses WC to go to dining services. Pt states she mostly just watches TV all day.      Hand Dominance        Extremity/Trunk Assessment   Upper Extremity Assessment: Generalized weakness           Lower Extremity Assessment: Generalized weakness      Cervical / Trunk Assessment: Normal  Communication   Communication: HOH  Cognition Arousal/Alertness: Awake/alert Behavior During Therapy: Anxious (Pt was fearful of getting oob, was afraid to fall) Overall Cognitive Status: Within Functional Limits for tasks assessed                      General Comments      Exercises        Assessment/Plan  PT Assessment Patient needs continued PT services  PT Diagnosis Difficulty walking;Generalized weakness   PT Problem List Decreased strength;Decreased activity tolerance;Decreased mobility;Decreased knowledge of use of DME  PT Treatment Interventions DME instruction;Gait training;Functional mobility training;Therapeutic activities;Therapeutic exercise;Patient/family education   PT Goals (Current goals can be found in the Care Plan section) Acute Rehab PT Goals Patient Stated Goal: to feel better PT Goal Formulation: With patient Time For Goal Achievement: 03/07/15 Potential to Achieve Goals: Good    Frequency Min 3X/week   Barriers to discharge        Co-evaluation               End of Session Equipment Utilized During Treatment: Gait belt;Oxygen (did not use oxygen tank d/t pt  declined amb) Activity Tolerance: Patient limited by fatigue;Other (comment) (pt limited by weakness) Patient left: in chair;with call bell/phone within reach;with chair alarm set Nurse Communication: Mobility status;Other (comment) (oxygen humidifier to be moved to the other side of the room for pt's oxygen)         Time: 1210-1229 PT Time Calculation (min) (ACUTE ONLY): 19 min   Charges:   PT Evaluation $Initial PT Evaluation Tier I: 1 Procedure     PT G Codes:        Pruitt Taboada, SPT 03-18-15, 3:25 PM

## 2015-02-21 NOTE — Progress Notes (Signed)
CSW attempted to meet patient today but she was beginning her PT evaluation. Patient admitted from [facility]- noted PT is recommending SNF- CSW will communicate with [facility] to seek SNF bed for her at dc.   Eduard Clos, MSW, [place]

## 2015-02-21 NOTE — Progress Notes (Signed)
[facility] PROGRESS NOTE  Paula Contreras GHW:[id number] DOB: 07/01/1933 DOA: 02/20/2015 PCP: Vena Austria, MD  Assessment/Plan: #1 acute on chronic hypoxic/hypercarbic respiratory failure Likely secondary to an acute COPD exacerbation. Patient more alert. Patient with some clinical improvement. Patient currently off BiPAP and on nasal cannula. Influenza PCR was negative. Continue IV Levaquin, Xopenex and Atrovent nebs, IV Solu-Medrol, chest PT, Tamiflu. BiPAP as needed. Critical care following and appreciate input and recommendations.  #2 acute COPD exacerbation with purulent bronchitis Questionable etiology. Influenza PCR negative. Clinical improvement. Continue IV Solu-Medrol, chest PT, IV Levaquin, Xopenex and Atrovent nebs, Tamiflu, BiPAP as needed.  #3 hypernatremia Likely secondary to dehydration. Change IV fluids to D5 W at 50 mL per hour. Follow.  #4 dehydration Gentle hydration.  #5 anemia No overt bleeding. Likely a dilutional effect. Check an anemia panel. Follow H&H.  #6 atrial fibrillation Currently rate controlled. Patient recently with a history of multiple falls. Will hold anticoagulation for now. Will defer resumption of anticoagulation to PCP as outpatient. On discharge will likely need to be placed on low-dose aspirin.  #7 acute encephalopathy Likely secondary to #1. Clinical improvement. Urine cultures pending. Patient on empiric IV Levaquin. Will need to find out patient's baseline from family. Follow.  #8 chronic kidney disease stage III Stable.  #9 leukocytosis Questionable etiology. Leukocytosis trending down. Chest x-ray negative. Urinalysis was small leukocytes, nitrite negative. Urine cultures pending. Continue empiric IV Levaquin. Follow.  #10 hypertension BP meds on hold. Follow.  #11 falls PT/OT. Due to patient's recent multiple falls will hold patient's anticoagulation and defer resumption to PCP.  #12 prophylaxis Lovenox for DVT  prophylaxis.   Code Status: DO NOT RESUSCITATE Family Communication: No family at bedside. Disposition Plan: Remain in the step down unit.   Consultants:  PCCM; Dr Halford Chessman 02/20/2015  Procedures:  Chest x-ray 02/20/2015, 02/21/2015  Antibiotics:  IV Levaquin 02/21/2015  HPI/Subjective: Patient is more alert and more interactive however minimally verbal. Patient following commands. Patient denies any chest pain. Patient denies any shortness of breath.  Objective: Filed Vitals:   02/21/15 0356 02/21/15 0400  BP:  95/42  Pulse:  68  Temp: 98 F (36.7 C)   Resp:  17    Intake/Output Summary (Last 24 hours) at 02/21/15 0819 Last data filed at 02/21/15 0700  Gross per 24 hour  Intake 868.58 ml  Output      0 ml  Net 868.58 ml   Filed Weights   02/20/15 1317 02/21/15 0356  Weight: 60.9 kg (134 lb 4.2 oz) 62 kg (136 lb 11 oz)    Exam:   General:  On Nebs. More alert.  Cardiovascular: Irregularly irregular with 3/6 SEM  Respiratory: Less rhonchorous. Less congested. Minimal to mild inspiratory and wheezes in the anterior lung fields.  Abdomen: Soft, nontender, nondistended, positive bowel sounds.  Musculoskeletal: No clubbing cyanosis or edema.  Data Reviewed: Basic Metabolic Panel:  Recent Labs Lab 02/19/15 0856 02/20/15 0915 02/20/15 1409 02/21/15 0355  NA 141 146*  --  148*  K 4.3 4.5  --  4.0  CL 94* 98*  --  102  CO2 37* 35*  --  35*  GLUCOSE 102* 101*  --  128*  BUN 25* 30*  --  39*  CREATININE 1.17* 1.27*  --  1.37*  CALCIUM 10.2 9.6  --  8.7*  MG  --   --  1.9  --   PHOS  --   --  4.9*  --  Liver Function Tests:  Recent Labs Lab 02/19/15 0856  AST 29  ALT 41  ALKPHOS 121  BILITOT 0.4  PROT 7.7  ALBUMIN 3.3*   No results for input(s): LIPASE, AMYLASE in the last 168 hours. No results for input(s): AMMONIA in the last 168 hours. CBC:  Recent Labs Lab 02/19/15 0856 02/20/15 0915 02/21/15 0355  WBC 9.5 11.3* 7.2  NEUTROABS  7.1 8.3*  --   HGB 9.0* 10.0* 8.1*  HCT 31.8* 34.5* 29.0*  MCV 96.1 95.8 97.0  PLT 249 276 255   Cardiac Enzymes: No results for input(s): CKTOTAL, CKMB, CKMBINDEX, TROPONINI in the last 168 hours. BNP (last 3 results) No results for input(s): BNP in the last 8760 hours.  ProBNP (last 3 results) No results for input(s): PROBNP in the last 8760 hours.  CBG:  Recent Labs Lab 02/19/15 0827  GLUCAP 92    Recent Results (from the past 240 hour(s))  MRSA PCR Screening     Status: None   Collection Time: 02/20/15  1:35 PM  Result Value Ref Range Status   MRSA by PCR NEGATIVE NEGATIVE Final    Comment:        The GeneXpert MRSA Assay (FDA approved for NASAL specimens only), is one component of a comprehensive MRSA colonization surveillance program. It is not intended to diagnose MRSA infection nor to guide or monitor treatment for MRSA infections.      Studies: Dg Chest 2 View  02/20/2015  CLINICAL DATA:  Altered mental status.  COPD.  The patient fell. EXAM: CHEST  2 VIEW COMPARISON:  02/19/2015 and  01/11/2015 and 12/27/2014 FINDINGS: Emphysema with diffuse chronic interstitial lung disease as well. New tiny left effusion. Pulmonary vascularity appears increased. Chronic accentuation of the thoracic kyphosis with old compression fractures. Extensive aortic atherosclerosis. IMPRESSION: Increased pulmonary vascular congestion superimposed on severe chronic lung disease. New tiny left effusion. Electronically Signed   By: Lorriane Shire M.D.   On: 02/20/2015 09:29   Dg Chest 2 View  02/19/2015  CLINICAL DATA:  Dizziness and confusion. EXAM: CHEST  2 VIEW COMPARISON:  01/11/2015 FINDINGS: The heart size is normal. Aortic atherosclerosis noted. No pleural effusion or edema. The lungs are hyperinflated. There are coarsened interstitial markings identified bilaterally compatible with COPD/emphysema. Multi level compression deformities noted within the lower thoracic and upper lumbar  spine. IMPRESSION: 1. Advanced changes of COPD/emphysema. 2. Aortic atherosclerosis 3. Age indeterminate thoracic compression deformities. Electronically Signed   By: Kerby Moors M.D.   On: 02/19/2015 09:44   Ct Head Wo Contrast  02/19/2015  CLINICAL DATA:  Fall and confusion.  Dizziness for 48 hours. EXAM: CT HEAD WITHOUT CONTRAST TECHNIQUE: Contiguous axial images were obtained from the base of the skull through the vertex without contrast. COMPARISON:  None FINDINGS: Diffuse low-density throughout the periventricular and subcortical white matter. No evidence for acute hemorrhage, mass lesion, midline shift, hydrocephalus or large infarct. Scattered areas of low-density in the cerebellum, particularly along the left cerebellar peduncle is nonspecific. Fluid in the right mastoid air cells. Mucosal thickening involving the right maxillary sinus, ethmoid air cells, right sphenoid sinus and right frontal sinus. No acute bone abnormality. IMPRESSION: No acute intracranial abnormality. Extensive white matter disease possibly representing small vessel ischemic changes. Extensive paranasal sinus disease and there is fluid in the right mastoid air cells. Electronically Signed   By: Markus Daft M.D.   On: 02/19/2015 09:49   Portable Chest 1 View  02/21/2015  CLINICAL DATA:  Shortness  of breath. EXAM: PORTABLE CHEST 1 VIEW COMPARISON:  02/20/2015.  01/11/2015.  09/25/2010. FINDINGS: Persistent mild cardiomegaly and pulmonary vascular congestion with increased interstitial markings and small left pleural effusion. Findings consistent with a component of congestive heart failure. Low lung volumes with bibasilar atelectasis. A component of basilar pneumonia cannot be excluded. Underlying chronic interstitial lung disease is present. No pneumothorax . IMPRESSION: 1. Mild cardiomegaly with persistent mild pulmonary vascular prominence. Diffuse pulmonary interstitial prominence with small left pleural effusion. Findings  suggest a component of congestive heart failure. 2. Low lung volumes with bibasilar atelectasis. A component of basilar pneumonia cannot be excluded. 3. Underlying chronic interstitial lung disease. Electronically Signed   By: Marcello Moores  Register   On: 02/21/2015 07:09   Dg Foot Complete Left  02/19/2015  CLINICAL DATA:  Foot pain secondary to a fall. EXAM: LEFT FOOT - COMPLETE 3+ VIEW COMPARISON:  None. FINDINGS: There is no evidence of fracture or dislocation. Degenerative arthritis at the first metatarsal phalangeal joint. Soft tissues are unremarkable. Diffuse osteopenia. IMPRESSION: No acute abnormality. Electronically Signed   By: Lorriane Shire M.D.   On: 02/19/2015 09:45   Dg Foot Complete Right  02/19/2015  CLINICAL DATA:  Dizziness and confusion x48hrs. Fall at assisted living home. Hx of dementia, COPD, Chronic Respiratory Failure, Breast surgery 1964, Hip fracture unstated laterality. Former smoker 1 pack/ day for 55 years. Bilateral foot pain. EXAM: RIGHT FOOT COMPLETE - 3+ VIEW COMPARISON:  None. FINDINGS: Bones are diffusely demineralized. There is no fracture dislocation. There are no significant arthropathic changes. Soft tissues are unremarkable. IMPRESSION: No acute fracture or dislocation. Electronically Signed   By: Lajean Manes M.D.   On: 02/19/2015 09:44   Dg Hip Unilat With Pelvis 2-3 Views Right  02/19/2015  CLINICAL DATA:  Dizziness and confusion x48hrs. Fall at assisted living home. Hx of dementia, COPD, Chronic Respiratory Failure, Breast surgery 1964, Hip fracture unstated laterality. Former smoker 1 pack/ day for 55 years. Bilateral foot pain. EXAM: DG HIP (WITH OR WITHOUT PELVIS) 2-3V RIGHT COMPARISON:  None. FINDINGS: No acute fracture. Old right proximal femur fracture has been reduced with 2 screws. Fracture is well healed. The hip joints, SI joints and symphysis pubis are normally spaced and aligned. Bones are diffusely demineralized. Soft tissues are unremarkable.  IMPRESSION: 1. No acute fracture or dislocation. Electronically Signed   By: Lajean Manes M.D.   On: 02/19/2015 09:43    Scheduled Meds: . enoxaparin (LOVENOX) injection  30 mg Subcutaneous Q24H  . ipratropium  0.5 mg Nebulization Q6H  . levalbuterol  0.63 mg Nebulization Q6H  . levofloxacin (LEVAQUIN) IV  250 mg Intravenous Q24H  . methylPREDNISolone (SOLU-MEDROL) injection  40 mg Intravenous Q6H  . oseltamivir  30 mg Oral BID  . sodium chloride  3 mL Intravenous Q12H   Continuous Infusions: . dextrose 5 % 1,000 mL infusion      Principal Problem:   Acute on chronic respiratory failure with hypercapnia (HCC) Active Problems:   Hypertension   COPD (chronic obstructive pulmonary disease) (HCC)   Chronic respiratory failure (HCC)   Aortic stenosis   PAF (paroxysmal atrial fibrillation) (HCC)   CKD (chronic kidney disease), stage III   Altered mental status   Acute encephalopathy   Weakness generalized   Dehydration   Leukocytosis   Anemia   COPD exacerbation (HCC)   COPD with acute exacerbation (HCC)   Bronchitis   Fall at nursing home   Hypernatremia    Time spent: 13  MINS    [facility] MD [facility] Pager [phone]. If 7PM-7AM, please contact night-coverage at [email or web address], password [facility] 02/21/2015, 8:19 AM  LOS: 1 day

## 2015-02-21 NOTE — Progress Notes (Signed)
PULMONARY / CRITICAL CARE MEDICINE   Name: Paula Contreras MRN: [id number] DOB: Mar 23, 1934    ADMISSION DATE:  02/20/2015 CONSULTATION DATE:  02/20/2015  REFERRING MD :  Dr. Grandville Silos  CHIEF COMPLAINT:  Short of breath   SUBJECTIVE: Pt states she is "terrible" but can not further define.  No acute events.   VITAL SIGNS: BP 121/50 mmHg  Pulse 78  Temp(Src) 98 F (36.7 C) (Axillary)  Resp 18  Ht '5\' 5"'$  (1.651 m)  Wt 136 lb 11 oz (62 kg)  BMI 22.75 kg/m2  SpO2 100%  INTAKE / OUTPUT: I/O last 3 completed shifts: In: 868.6 [I.V.:868.6] Out: -   PHYSICAL EXAMINATION: General:  Elderly female in NAD Neuro:  Awake, speech clear but not oriented,  Long pauses after answering questions HEENT:  MM pink/moist, no jvd Cardiovascular:  H8I6 rrr, 2/6 systolic murmur  Lungs:  Even/non-labored, lungs bilaterally with good air movement, no wheeze Abdomen:  Soft, non tender Musculoskeletal:  No edema Skin:  No rashes or lesions  LABS:  CBC  Recent Labs Lab 02/19/15 0856 02/20/15 0915 02/21/15 0355  WBC 9.5 11.3* 7.2  HGB 9.0* 10.0* 8.1*  HCT 31.8* 34.5* 29.0*  PLT 249 276 255   Coag's No results for input(s): APTT, INR in the last 168 hours.   BMET  Recent Labs Lab 02/19/15 0856 02/20/15 0915 02/21/15 0355  NA 141 146* 148*  K 4.3 4.5 4.0  CL 94* 98* 102  CO2 37* 35* 35*  BUN 25* 30* 39*  CREATININE 1.17* 1.27* 1.37*  GLUCOSE 102* 101* 128*   Electrolytes  Recent Labs Lab 02/19/15 0856 02/20/15 0915 02/20/15 1409 02/21/15 0355  CALCIUM 10.2 9.6  --  8.7*  MG  --   --  1.9  --   PHOS  --   --  4.9*  --    Sepsis Markers  Recent Labs Lab 02/19/15 0909 02/19/15 1217 02/20/15 0951  LATICACIDVEN 0.54 0.80 1.26   ABG  Recent Labs Lab 02/20/15 1055  PHART 7.209*  PCO2ART 87.3*  PO2ART 94.0   Liver Enzymes  Recent Labs Lab 02/19/15 0856  AST 29  ALT 41  ALKPHOS 121  BILITOT 0.4  ALBUMIN 3.3*   Cardiac Enzymes No results for  input(s): TROPONINI, PROBNP in the last 168 hours.   Glucose  Recent Labs Lab 02/19/15 0827  GLUCAP 92    Imaging Dg Chest 2 View  02/20/2015  CLINICAL DATA:  Altered mental status.  COPD.  The patient fell. EXAM: CHEST  2 VIEW COMPARISON:  02/19/2015 and  01/11/2015 and 12/27/2014 FINDINGS: Emphysema with diffuse chronic interstitial lung disease as well. New tiny left effusion. Pulmonary vascularity appears increased. Chronic accentuation of the thoracic kyphosis with old compression fractures. Extensive aortic atherosclerosis. IMPRESSION: Increased pulmonary vascular congestion superimposed on severe chronic lung disease. New tiny left effusion. Electronically Signed   By: Lorriane Shire M.D.   On: 02/20/2015 09:29   Portable Chest 1 View  02/21/2015  CLINICAL DATA:  Shortness of breath. EXAM: PORTABLE CHEST 1 VIEW COMPARISON:  02/20/2015.  01/11/2015.  09/25/2010. FINDINGS: Persistent mild cardiomegaly and pulmonary vascular congestion with increased interstitial markings and small left pleural effusion. Findings consistent with a component of congestive heart failure. Low lung volumes with bibasilar atelectasis. A component of basilar pneumonia cannot be excluded. Underlying chronic interstitial lung disease is present. No pneumothorax . IMPRESSION: 1. Mild cardiomegaly with persistent mild pulmonary vascular prominence. Diffuse pulmonary interstitial prominence with small left pleural effusion.  Findings suggest a component of congestive heart failure. 2. Low lung volumes with bibasilar atelectasis. A component of basilar pneumonia cannot be excluded. 3. Underlying chronic interstitial lung disease. Electronically Signed   By: Marcello Moores  Register   On: 02/21/2015 07:09     STUDIES:   CULTURES: Urine 11/20 >>  ANTIBIOTICS: Levaquin 11/20 >> Tamiflu 11/20 >>  SIGNIFICANT EVENTS: 11/20 Admit 11/21 Off bipap, no distress.    LINES/TUBES:  DISCUSSION: 81 yo female admitted with  altered mental status from acute on chronic hypoxic/hypercapnic respiratory failure.  She has hx of severe COPD on home oxygen, dementia, a fib on xarelto.  She is DNR/DNI.  ASSESSMENT / PLAN:  Acute on chronic hypoxic/hypercapnic respiratory failure. AECOPD. Plan: - BiPAP PRN increased work of breathing - oxygen to keep SpO2 88 to 95% - f/u CXR as needed - limit ABGs - continue atrovent, xopenex - continue solumedrol x 24 hours total, then transition to oral steroids as no bronchospasm on exam  - day 2 levaquin, tamiflu  Acute encephalopathy 2nd to respiratory failure. Hx of dementia >> not sure what baseline functional status is. Plan: - monitor mental status  Permanent A fib on xarelto as outpt. Plan: - might be better to avoid DOAC in this setting with concern for falls >> defer to primary team  Goals of Care >> DNR/DNI.    PCCM will be available PRN.  Please call back if new needs arise.    Noe Gens, NP-C [facility] Pgr: [phone] or if no answer [phone] 02/21/2015, 8:49 AM

## 2015-02-21 NOTE — Evaluation (Signed)
Clinical/Bedside Swallow Evaluation Patient Details  Name: Paula Contreras MRN: [id number] Date of Birth: [birth year]  Today's Date: 02/21/2015 Time: SLP Start Time (ACUTE ONLY): 1400 SLP Stop Time (ACUTE ONLY): 1425 SLP Time Calculation (min) (ACUTE ONLY): 25 min  Past Medical History:  Past Medical History  Diagnosis Date  . Cataract   . Other psoriasis and similar disorders     psoriasis vs eczema  . Hypertension   . COPD (chronic obstructive pulmonary disease) ([place])   . CKD (chronic kidney disease), stage III   . Persistent atrial fibrillation ([place])     a. Dx 07/2013. No attempts per Epic to restore NSR.  Marland Kitchen Aortic stenosis     a. 2D Echo 07/2013: EF 60-65%, no RWMA, grade 1 DD, normal LV filling pressure, moderate AS, mild TR, PASP 23mHg.  .[place]     a. On home O2 since 2012.  .Marland KitchenAnemia 02/20/2015   Past Surgical History:  Past Surgical History  Procedure Laterality Date  . Cataract extraction    . Breast surgery  1964  . Hip surgery      fracture   HPI:  81[facility] pt required Bipap and NT suctioning.  Pt CXR 11/21 showed ? CHF, interstitial lung dx underlying.  Pt is a prior smoker, has Gold Stage IV COPD - requires 4 liters of oxygen, has aortic stenosis and h/o falls.  She resides at ALF.  MD started dys3/thin diet andorder slp to evaluate pt's swallow.  Per review of chart, palliative referral recommended.    Assessment / Plan / Recommendation Clinical Impression  Pt presents with negative CN exam and she denies dysphagia prior to admit or currently.  ? whitish coating on tongue, ? consistent with oral candidiasis  - pt denies odynophagia.  Pt with cough x4 during session-  most notably after swallow of pudding/thin/  SLP can not definitvely determine if cough was related to po aspiration, secretions and/or current pulmonary condition.   Swallow appeared timely however.  Note per chart review, pt  for palliative referral.  Recommend continue dys3/thin diet with aspiration precautions.  Pt appeared confused today -and no family was present.  Will follow up x1 for family education as indicated.     Aspiration Risk  Moderate aspiration risk    Diet Recommendation Dysphagia 3 (Mech soft);Thin liquid   Liquid Administration via: Cup;Straw Medication Administration:  (as tolerated) Supervision: Patient able to self feed Compensations: Small sips/bites;Slow rate    Other  Recommendations Oral Care Recommendations: Oral care BID   Follow up Recommendations  None    Frequency and Duration min 1 x/week  1 week       Swallow Study   General Date of Onset: 02/21/15 H[facility] 81[facility] pt required Bipap and NT suctioning.  Pt CXR 11/21 showed ? CHF, interstitial lung dx underlying.  Pt is a prior smoker, has Gold Stage IV COPD - requires 4 liters of oxygen, has aortic stenosis and h/o falls.  She resides at ALF.  MD started dys3/thin diet andorder slp to evaluate pt's swallow.  Per review of chart, palliative referral recommended.  Type of Study: Bedside Swallow Evaluation Diet Prior to this Study: Dysphagia 3 (soft);Thin liquids Temperature Spikes Noted: No Respiratory Status: Nasal cannula Behavior/Cognition: Alert;Cooperative Oral Cavity Assessment: Within Functional Limits Oral Care Completed by SLP: No Oral Cavity - Dentition: Poor condition Vision: Functional for self-feeding Self-Feeding  Abilities: Able to feed self Patient Positioning: Upright in chair Baseline Vocal Quality: Normal Volitional Cough: Strong Volitional Swallow: Able to elicit    Oral/Motor/Sensory Function Overall Oral Motor/Sensory Function: Within functional limits   Ice Chips Ice chips: Not tested   Thin Liquid Thin Liquid: Within functional limits Presentation: Cup;Straw;Spoon    Nectar Thick Nectar Thick Liquid: Not tested   Honey Thick Honey Thick Liquid:  Not tested   Puree Puree: Within functional limits Presentation: Self Fed;Spoon   Solid Solid: Within functional limits Presentation: [place], [facility] SLP [phone]

## 2015-02-21 NOTE — Care Management Note (Signed)
Case Management Note  Patient Details  Name: Paula Contreras MRN: [id number] Date of Birth: [birth year]  Subjective/Objective:         copd  Action/Plan:Date: February 21, 2015 Chart reviewed for concurrent status and case management needs. Will continue to follow patient for changes and needs: Velva Harman, RN, BSN, [place]   [phone]  Expected Discharge Date:  02/24/15               Expected Discharge Plan:  Home/Self Care  In-House Referral:  NA  Discharge planning Services  CM Consult  Post Acute Care Choice:  NA Choice offered to:  NA  DME Arranged:    DME Agency:     HH Arranged:    HH Agency:     Status of Service:  In process, will continue to follow  Medicare Important Message Given:    Date Medicare IM Given:    Medicare IM give by:    Date Additional Medicare IM Given:    Additional Medicare Important Message give by:     If discussed at [place] of Stay Meetings, dates discussed:    Additional Comments:  Leeroy Cha, RN 02/21/2015, 10:44 AM

## 2015-02-22 ENCOUNTER — Ambulatory Visit: Payer: Medicare Other | Admitting: Podiatry

## 2015-02-22 DIAGNOSIS — G9341 Metabolic encephalopathy: Secondary | ICD-10-CM

## 2015-02-22 DIAGNOSIS — J9611 Chronic respiratory failure with hypoxia: Secondary | ICD-10-CM

## 2015-02-22 DIAGNOSIS — Z515 Encounter for palliative care: Secondary | ICD-10-CM

## 2015-02-22 DIAGNOSIS — J449 Chronic obstructive pulmonary disease, unspecified: Secondary | ICD-10-CM

## 2015-02-22 DIAGNOSIS — R0602 Shortness of breath: Secondary | ICD-10-CM | POA: Insufficient documentation

## 2015-02-22 LAB — BASIC METABOLIC PANEL
ANION GAP: 9 (ref 5–15)
BUN: 43 mg/dL — ABNORMAL HIGH (ref 6–20)
CALCIUM: 8.6 mg/dL — AB (ref 8.9–10.3)
CO2: 36 mmol/L — ABNORMAL HIGH (ref 22–32)
Chloride: 97 mmol/L — ABNORMAL LOW (ref 101–111)
Creatinine, Ser: 1.26 mg/dL — ABNORMAL HIGH (ref 0.44–1.00)
GFR, EST AFRICAN AMERICAN: 46 mL/min — AB (ref >60)
GFR, EST NON AFRICAN AMERICAN: 39 mL/min — AB (ref >60)
Glucose, Bld: 163 mg/dL — ABNORMAL HIGH (ref 65–99)
POTASSIUM: 3.9 mmol/L (ref 3.5–5.1)
SODIUM: 142 mmol/L (ref 135–145)

## 2015-02-22 LAB — CBC
HCT: 27.3 % — ABNORMAL LOW (ref 36.0–46.0)
Hemoglobin: 8.1 g/dL — ABNORMAL LOW (ref 12.0–15.0)
MCH: 27.4 pg (ref 26.0–34.0)
MCHC: 29.7 g/dL — ABNORMAL LOW (ref 30.0–36.0)
MCV: 92.2 fL (ref 78.0–100.0)
PLATELETS: 253 10*3/uL (ref 150–400)
RBC: 2.96 MIL/uL — AB (ref 3.87–5.11)
RDW: 14.7 % (ref 11.5–15.5)
WBC: 10.1 10*3/uL (ref 4.0–10.5)

## 2015-02-22 LAB — GLUCOSE, CAPILLARY: GLUCOSE-CAPILLARY: 131 mg/dL — AB (ref 65–99)

## 2015-02-22 LAB — MAGNESIUM: MAGNESIUM: 2.1 mg/dL (ref 1.7–2.4)

## 2015-02-22 MED ORDER — ENOXAPARIN SODIUM 40 MG/0.4ML ~~LOC~~ SOLN
40.0000 mg | SUBCUTANEOUS | Status: DC
  Administered 2015-02-22: 40 mg via SUBCUTANEOUS
  Filled 2015-02-22: qty 0.4

## 2015-02-22 MED ORDER — PREDNISONE 20 MG PO TABS
60.0000 mg | ORAL_TABLET | Freq: Every day | ORAL | Status: DC
  Administered 2015-02-23: 60 mg via ORAL
  Filled 2015-02-22: qty 3

## 2015-02-22 MED ORDER — LEVOFLOXACIN IN D5W 250 MG/50ML IV SOLN
250.0000 mg | INTRAVENOUS | Status: AC
  Administered 2015-02-22: 250 mg via INTRAVENOUS
  Filled 2015-02-22: qty 50

## 2015-02-22 MED ORDER — ASPIRIN 81 MG PO CHEW
81.0000 mg | CHEWABLE_TABLET | Freq: Every day | ORAL | Status: DC
  Administered 2015-02-22 – 2015-02-23 (×2): 81 mg via ORAL
  Filled 2015-02-22 (×2): qty 1

## 2015-02-22 MED ORDER — LEVOFLOXACIN 500 MG PO TABS
250.0000 mg | ORAL_TABLET | Freq: Every day | ORAL | Status: DC
  Administered 2015-02-23: 250 mg via ORAL
  Filled 2015-02-22: qty 1

## 2015-02-22 NOTE — Progress Notes (Signed)
Rt placed pt back on BIPAP due to low sat(79). Pt doing well sat now 91% on BIPAP.

## 2015-02-22 NOTE — NC FL2 (Signed)
Reader LEVEL OF CARE SCREENING TOOL     IDENTIFICATION  Patient Name: Paula Contreras Birthdate: [birth year] Sex: female Admission Date (Current Location): 02/20/2015  [facility] and [place] Number: Company secretary and Address:         Provider Number: [id number]  Attending Physician Name and Address:  Eugenie Filler, MD  Relative Name and Phone Number:       Current Level of Care: Hospital Recommended Level of Care: [place] Prior Approval Number:    Date Approved/Denied:   PASRR Number:   [id number] Discharge Plan: SNF    Current Diagnoses: Patient Active Problem List   Diagnosis Date Noted  . Hypernatremia 02/21/2015  . Altered mental status 02/20/2015  . Metabolic encephalopathy 48/18/5631  . Weakness generalized 02/20/2015  . Dehydration 02/20/2015  . Leukocytosis 02/20/2015  . Anemia 02/20/2015  . COPD exacerbation ([place]) 02/20/2015  . Acute on chronic respiratory failure with hypercapnia ([place]) 02/20/2015  . COPD with acute exacerbation ([place]) 02/20/2015  . Bronchitis 02/20/2015  . Fall at nursing home 02/20/2015  . Bradycardia 04/09/2014  . Chronic respiratory failure ([place])   . Aortic stenosis   . PAF (paroxysmal atrial fibrillation) ([place])   . CKD (chronic kidney disease), stage III   . Aortic valve disorder 10/30/2013  . Undiagnosed cardiac murmurs 07/21/2013  . Hypertension 10/02/2010  . COPD (chronic obstructive pulmonary disease) ([place]) 10/02/2010    Orientation ACTIVITIES/SOCIAL BLADDER RESPIRATION       Active Incontinent O2 (As needed) (4L)  BEHAVIORAL SYMPTOMS/MOOD NEUROLOGICAL BOWEL NUTRITION STATUS      Continent Diet (DYS 3)  PHYSICIAN VISITS COMMUNICATION OF NEEDS Height & Weight Skin        136 lbs. Other (Comment) (Skin tear Left Arm )          AMBULATORY STATUS RESPIRATION    Assist extensive O2 (As needed) (4L)      Personal Care Assistance Level of Assistance  Bathing, Dressing  Bathing Assistance: Limited assistance   Dressing Assistance: Limited assistance      Functional Limitations Info                SPECIAL CARE FACTORS FREQUENCY                      Additional Factors Info                  Current Medications (02/22/2015): Current Facility-Administered Medications  Medication Dose Route Frequency Provider Last Rate Last Dose  . acetaminophen (TYLENOL) tablet 650 mg  650 mg Oral Q6H PRN Eugenie Filler, MD       Or  . acetaminophen (TYLENOL) suppository 650 mg  650 mg Rectal Q6H PRN Eugenie Filler, MD      . alum & mag hydroxide-simeth (MAALOX/MYLANTA) 200-200-20 MG/5ML suspension 30 mL  30 mL Oral Q6H PRN Eugenie Filler, MD      . antiseptic oral rinse (CPC / CETYLPYRIDINIUM CHLORIDE 0.05%) solution 7 mL  7 mL Mouth Rinse q12n4p Eugenie Filler, MD   7 mL at 02/22/15 1137  . aspirin chewable tablet 81 mg  81 mg Oral Daily Eugenie Filler, MD   81 mg at 02/22/15 1130  . chlorhexidine (PERIDEX) 0.12 % solution 15 mL  15 mL Mouth Rinse BID Eugenie Filler, MD   15 mL at 02/22/15 1100  . enoxaparin (LOVENOX) injection 30 mg  30 mg Subcutaneous Q24H Eugenie Filler,  MD   30 mg at 02/21/15 1715  . ipratropium (ATROVENT) nebulizer solution 0.5 mg  0.5 mg Nebulization Q2H PRN Irine Seal V, MD      . ipratropium (ATROVENT) nebulizer solution 0.5 mg  0.5 mg Nebulization QID Eugenie Filler, MD   0.5 mg at 02/22/15 1219  . levalbuterol (XOPENEX) nebulizer solution 0.63 mg  0.63 mg Nebulization Q2H PRN Eugenie Filler, MD      . levalbuterol Penne Lash) nebulizer solution 0.63 mg  0.63 mg Nebulization QID Eugenie Filler, MD   0.63 mg at 02/22/15 1219  . Levofloxacin (LEVAQUIN) IVPB 250 mg  250 mg Intravenous Q24H Eugenie Filler, MD   250 mg at 02/22/15 1132  . [START ON 02/23/2015] levofloxacin (LEVAQUIN) tablet 250 mg  250 mg Oral Daily Irine Seal V, MD      . ondansetron [facility]) injection 4 mg  4 mg Intravenous  Q6H PRN Eugenie Filler, MD      . oseltamivir (TAMIFLU) 6 MG/ML suspension 30 mg  30 mg Oral BID Eugenie Filler, MD   30 mg at 02/22/15 1131  . [START ON 02/23/2015] predniSONE (DELTASONE) tablet 60 mg  60 mg Oral QAC breakfast Eugenie Filler, MD      . senna-docusate (Senokot-S) tablet 1 tablet  1 tablet Oral QHS PRN Irine Seal V, MD      . sodium chloride 0.9 % injection 3 mL  3 mL Intravenous Q12H Eugenie Filler, MD   3 mL at 02/22/15 1132  . sorbitol 70 % solution 30 mL  30 mL Oral Daily PRN Eugenie Filler, MD       Do not use this list as official medication orders. Please verify with discharge summary.  Discharge Medications:   Medication List    ASK your doctor about these medications        acetaminophen 325 MG tablet  Commonly known as:  TYLENOL  Take 650 mg by mouth every 6 (six) hours as needed (for pain).     albuterol 108 (90 BASE) MCG/ACT inhaler  Commonly known as:  PROAIR HFA  Inhale 2 puffs into the lungs every 4 (four) hours as needed for wheezing or shortness of breath.     alendronate 70 MG tablet  Commonly known as:  FOSAMAX  Take 70 mg by mouth every Monday. Take with a full glass of water on an empty stomach.     calcium carbonate 600 MG Tabs tablet  Commonly known as:  OS-CAL  Take 2 tablets by mouth daily with breakfast.     lactose free nutrition Liqd  Take 237 mLs by mouth 2 (two) times daily between meals.     OXYGEN  Inhale 2-3 L into the lungs continuous. Change to 3L when on portable     predniSONE 10 MG tablet  Commonly known as:  DELTASONE  Take 4 tabs for 2 days, then 3 tabs for 2 days, 2 tabs for 2 days, then 1 tab for 2 days, then stop.     predniSONE 10 MG tablet  Commonly known as:  DELTASONE  Take 4 tablets (40 mg total) by mouth daily.     Tiotropium Bromide Monohydrate 1.25 MCG/ACT Aers  Commonly known as:  SPIRIVA RESPIMAT  Inhale 2 puffs into the lungs daily.     Vitamin D 2000 UNITS Caps  Take 2,000  Units by mouth daily with breakfast.     XARELTO 15 MG Tabs tablet  Generic  drug:  Rivaroxaban  Take 15 mg by mouth daily with breakfast.        Relevant Imaging Results:  Relevant Lab Results:  Recent Labs    Additional Information SSN  [id number]  Ludwig Clarks, LCSW

## 2015-02-22 NOTE — Consult Note (Signed)
Consultation Note Date: 02/22/2015   Patient Name: Paula Contreras  DOB: 1934/03/29  MRN: [id number]  Age / Sex: 81 y.o., female  PCP: Maury Dus, MD Referring Physician: Eugenie Filler, MD  Reason for Consultation: Establishing goals of care  Life limiting illness: Advanced chronic obstructive pulmonary disease  Clinical Assessment/Narrative: Patient is a 81 year old lady who came from collapse assisted living facility. 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  Patient was brought in with altered mental status acute on chronic hypoxic respiratory failure. She was diagnosed with COPD exacerbation, bronchitis. She is admitted to the stepdown unit. At times, she is requiring BiPAP briefly for low oxygen saturations. She has been seen and evaluated by pulmonary, she is deemed to have stage IV Gold criteria as far as her COPD. Hence palliative consult was recommended.  Patient is sitting in a chair. She has been seen and evaluated by physical therapy as well as speech therapy. She is able to answer most questions appropriately. She is able to state her name, she most she is in the hospital, for her breathing problems. She states that her sister-in-law [name] is her medical power of attorney. The patient states she does not think that she needs hospice just yet. She states "I don't think it's that bad"  Patient is to go to skilled level of care towards the end of this hospitalization. Would recommend palliative consultation over there. Call placed and discussed in detail with healthcare power of attorney [name] at [phone]. [name] would like for the patient to go via ambulance whenever she is discharge. Agreed that that  would be appropriate. All of [name]'s questions answered to the best of my ability.  Contacts/Participants in [place] Primary Decision Maker: patient, then her sister-in-law healthcare power of attorney [name] at [phone] Relationship to Patient sister-in-law HCPOA: yes  Reportedly [name] is noted to be the healthcare power of attorney agent. Actual document and available for my review at the time of initial consultation.  SUMMARY OF RECOMMENDATIONS: Agree with SNF placement towards the end of this hospitalization Agree with DNR/DNI Would recommend palliative consult at skilled nursing facility for close follow-up.  Code Status/Advance Care Planning: DNR    Code Status Orders        Start     Ordered   02/20/15 1320  Do not attempt resuscitation (DNR)   Continuous    Question Answer Comment  In the event of cardiac or respiratory ARREST Do not call a "code blue"   In the event of cardiac or respiratory ARREST Do not perform Intubation, CPR, defibrillation or ACLS   In the event of cardiac or respiratory ARREST Use medication by any route, position, wound care, and other measures to relive pain and suffering. May use oxygen, suction and manual treatment of airway obstruction as needed for comfort.      02/20/15 1319      Other Directives:Other  Symptom Management:   As above  Palliative Prophylaxis:   Delirium Protocol  Additional Recommendations (Limitations, Scope, Preferences):  continue to follow disease trajectory    Psycho-social/Spiritual:  Support System: [place] Desire for further Chaplaincy support:no Additional Recommendations: Caregiving  Support/Resources  Prognosis: probably less than 6 months  Discharge Planning: [place] for rehab with Palliative care service follow-up   Chief Complaint/ Primary Diagnoses: Present on  Admission:  . Altered mental status . Metabolic encephalopathy . Hypertension .  Chronic respiratory failure ([place]) . COPD (chronic obstructive pulmonary disease) ([place]) . PAF (paroxysmal atrial fibrillation) ([place]) . CKD (chronic kidney disease), stage III . Dehydration . Leukocytosis . Anemia . COPD exacerbation ([place]) . Acute on chronic respiratory failure with hypercapnia ([place]) . COPD with acute exacerbation ([place]) . Bronchitis . Fall at nursing home . Hypernatremia  I have reviewed the medical record, interviewed the patient and family, and examined the patient. The following aspects are pertinent.  Past Medical History  Diagnosis Date  . Cataract   . Other psoriasis and similar disorders     psoriasis vs eczema  . Hypertension   . COPD (chronic obstructive pulmonary disease) ([place])   . CKD (chronic kidney disease), stage III   . Persistent atrial fibrillation ([place])     a. Dx 07/2013. No attempts per Epic to restore NSR.  Marland Kitchen Aortic stenosis     a. 2D Echo 07/2013: EF 60-65%, no RWMA, grade 1 DD, normal LV filling pressure, moderate AS, mild TR, PASP 69mHg.  .[place]ronic respiratory failure ([place]     a. On home O2 since 2012.  .Marland KitchenAnemia 02/20/2015   Social History   Social History  . Marital Status: Unknown    Spouse Name: N/A  . Number of Children: N/A  . Years of Education: N/A   Occupational History  . data entry specialist     retired   Social History Main Topics  . Smoking status: Former Smoker -- 1.00 packs/day for 55 years    Types: Cigarettes    Quit date: 09/25/2010  . Smokeless tobacco: Never Used  . Alcohol Use: No  . Drug Use: No  . Sexual Activity: Not Asked   Other Topics Concern  . None   Social History Narrative   Family History  Problem Relation Age of Onset  . Asthma Mother   . Asthma Maternal Grandmother   . Lung cancer Father   . Lung cancer Paternal Uncle     father's twin brother   Scheduled Meds: . antiseptic oral rinse  7 mL Mouth Rinse q12n4p  . aspirin  81 mg Oral Daily  . chlorhexidine  15 mL Mouth Rinse BID    . enoxaparin (LOVENOX) injection  30 mg Subcutaneous Q24H  . ipratropium  0.5 mg Nebulization QID  . levalbuterol  0.63 mg Nebulization QID  . [START ON 02/23/2015] levofloxacin  250 mg Oral Daily  . oseltamivir  30 mg Oral BID  . [START ON 02/23/2015] predniSONE  60 mg Oral QAC breakfast  . sodium chloride  3 mL Intravenous Q12H   Continuous Infusions:  PRN Meds:.acetaminophen **OR** acetaminophen, alum & mag hydroxide-simeth, ipratropium, levalbuterol, [DISCONTINUED] ondansetron **OR** ondansetron (ZOFRAN) IV, senna-docusate, sorbitol Medications Prior to Admission:  Prior to Admission medications   Medication Sig Start Date End Date Taking? Authorizing Provider  acetaminophen (TYLENOL) 325 MG tablet Take 650 mg by mouth every 6 (six) hours as needed (for pain).    Yes Historical Provider, MD  albuterol (PROAIR HFA) 108 (90 BASE) MCG/ACT inhaler Inhale 2 puffs into the lungs every 4 (four) hours as needed for wheezing or shortness of breath. 12/24/14  Yes RCollene Gobble MD  alendronate (FOSAMAX) 70 MG tablet Take 70 mg by mouth every Monday. Take with a full glass of water on an empty stomach.   Yes Historical Provider, MD  calcium carbonate (OS-CAL) 600 MG TABS tablet Take 2 tablets  by mouth daily with breakfast.    Yes Historical Provider, MD  Cholecalciferol (VITAMIN D) 2000 UNITS CAPS Take 2,000 Units by mouth daily with breakfast.    Yes Historical Provider, MD  lactose free nutrition (BOOST) LIQD Take 237 mLs by mouth 2 (two) times daily between meals.   Yes Historical Provider, MD  OXYGEN Inhale 2-3 L into the lungs continuous. Change to 3L when on portable   Yes Historical Provider, MD  Tiotropium Bromide Monohydrate (SPIRIVA RESPIMAT) 1.25 MCG/ACT AERS Inhale 2 puffs into the lungs daily. 08/31/14  Yes Collene Gobble, MD  XARELTO 15 MG TABS tablet Take 15 mg by mouth daily with breakfast.  12/14/14  Yes Historical Provider, MD  predniSONE (DELTASONE) 10 MG tablet Take 4 tabs for 2  days, then 3 tabs for 2 days, 2 tabs for 2 days, then 1 tab for 2 days, then stop. Patient not taking: Reported on 01/11/2015 12/27/14   Marshell Garfinkel, MD  predniSONE (DELTASONE) 10 MG tablet Take 4 tablets (40 mg total) by mouth daily. Patient not taking: Reported on 02/19/2015 01/13/15   Forde Dandy, MD   Allergies  Allergen Reactions  . Codeine Nausea And Vomiting    Review of Systems Positive for occasional shortness of breath Physical Exam Weak cachectic appearing elderly lady sitting up in chair Diminished S1-S2 Abdomen soft No edema Patient has a history of dementia, answers several questions appropriately. Vital Signs: BP 125/55 mmHg  Pulse 77  Temp(Src) 98 F (36.7 C) (Oral)  Resp 25  Ht '5\' 5"'$  (1.651 m)  Wt 62 kg (136 lb 11 oz)  BMI 22.75 kg/m2  SpO2 93%  SpO2: SpO2: 93 % O2 Device:SpO2: 93 % O2 Flow Rate: .O2 Flow Rate (L/min): 4 L/min  IO: Intake/output summary:  Intake/Output Summary (Last 24 hours) at 02/22/15 1307 Last data filed at 02/22/15 1132  Gross per 24 hour  Intake   1340 ml  Output      0 ml  Net   1340 ml    LBM: Last BM Date: 02/19/15 Baseline Weight: Weight: 60.9 kg (134 lb 4.2 oz) Most recent weight: Weight: 62 kg (136 lb 11 oz)      Palliative Assessment/Data:  Flowsheet Rows        Most Recent Value   Intake Tab    Referral Department  Hospitalist   Unit at Time of Referral  ICU   Palliative Care Primary Diagnosis  Pulmonary   Date Notified  02/21/15   Palliative Care Type  New Palliative care   Reason for referral  Clarify Goals of Care, Counsel Regarding Hospice   Date of Admission  02/20/15   # of days IP prior to Palliative referral  1   Clinical Assessment    Palliative Performance Scale Score  30%   Psychosocial & Spiritual Assessment    Palliative Care Outcomes       Additional Data Reviewed:  CBC:    Component Value Date/Time   WBC 10.1 02/22/2015 0348   HGB 8.1* 02/22/2015 0348   HCT 27.3* 02/22/2015 0348     PLT 253 02/22/2015 0348   MCV 92.2 02/22/2015 0348   NEUTROABS 8.3* 02/20/2015 0915   LYMPHSABS 1.0 02/20/2015 0915   MONOABS 1.9* 02/20/2015 0915   EOSABS 0.0 02/20/2015 0915   BASOSABS 0.1 02/20/2015 0915   Comprehensive Metabolic Panel:    Component Value Date/Time   NA 142 02/22/2015 0348   K 3.9 02/22/2015 0348   CL 97*  02/22/2015 0348   CO2 36* 02/22/2015 0348   BUN 43* 02/22/2015 0348   CREATININE 1.26* 02/22/2015 0348   GLUCOSE 163* 02/22/2015 0348   CALCIUM 8.6* 02/22/2015 0348   AST 29 02/19/2015 0856   ALT 41 02/19/2015 0856   ALKPHOS 121 02/19/2015 0856   BILITOT 0.4 02/19/2015 0856   PROT 7.7 02/19/2015 0856   ALBUMIN 3.3* 02/19/2015 0856     Time In: 1000 Time Out: 1100 Time Total: 60 Greater than 50%  of this time was spent counseling and coordinating care related to the above assessment and plan.  Signed by: Loistine Chance, MD [id number] Loistine Chance, MD  02/22/2015, 1:07 PM  Please contact Palliative Medicine Team phone at [phone] for questions and concerns.

## 2015-02-22 NOTE — Progress Notes (Signed)
[facility] PROGRESS NOTE  Paula Contreras TTS:[id number] DOB: [birth year] DOA: 02/20/2015 PCP: Vena Austria, MD  Brief interval history Patient with history of chronic respiratory failure on home O2 with COPD stage IV on chronic home O2 3 L nasal cannula, chronic kidney disease, A. fib on chronic anticoagulation, hypertension, aortic stenosis with recent multiple falls presented from assisted nursing facility at [facility] with an acute metabolic encephalopathy, generalized weakness. Patient on admission noted to be in acute on chronic respiratory failure secondary to an acute COPD exacerbation with purulent bronchitis. Patient was admitted to the step down unit had to be placed on the BiPAP over 24 hours placed on IV steroids, IV antibiotics, scheduled nebulizers, O2, chest PT. Pulmonary was consulted and followed the patient during the hospitalization. Patient improved clinically and will be transferred to [place] floor.      Assessment/Plan: #1 acute on chronic hypoxic/hypercarbic respiratory failure Likely secondary to an acute COPD exacerbation. Patient more alert and interactive and answering questions appropriately. Patient with significant clinical improvement. Patient currently off BiPAP and on nasal cannula. Influenza PCR was negative. Continue Xopenex and Atrovent nebs chest PT, Tamiflu. Change IV Levaquin to oral Levaquin. Change IV Solu-Medrol to oral prednisone 60 mg daily and taper slowly over 10-12 day. Critical care following and appreciate input and recommendations.  #2 acute COPD exacerbation with purulent bronchitis in the setting of gold stage IV COPD and dementia Questionable etiology. Influenza PCR negative. Clinical improvement. Change IV Solu-Medrol to oral prednisone  and taper slowly over 10-12 day period. Continue chest PT, Xopenex and Atrovent nebs, Tamiflu. Change IV Levaquin to oral Levaquin. Patient with gold stage IV COPD and dementia with ongoing  cachexia. Palliative care consult.  #3 hypernatremia Likely secondary to dehydration. Improved with D5W.  #4 dehydration Saline lock IV fluids.   #5 anemia of chronic disease No overt bleeding. Likely a dilutional effect. Anemia panel consistent with anemia of chronic disease. Follow H&H.  #6 atrial fibrillation Currently rate controlled. Patient recently with a history of multiple falls and dementia. Will hold anticoagulation for now. Will place on a baby aspirin. Will defer resumption of anticoagulation to PCP as outpatient.  #7 acute metabolic encephalopathy Likely secondary to #1. Clinical improvement. Patient likely close to baseline. Patient alert mentating well interacting well responding appropriately to questions. Urine cultures pending. Patient on empiric IV Levaquin. Follow.  #8 chronic kidney disease stage III Stable.  #9 leukocytosis Questionable etiology. Leukocytosis trending down. Chest x-ray negative. Urinalysis was small leukocytes, nitrite negative. Urine cultures pending. Continue empiric IV Levaquin. Follow.  #10 hypertension BP meds on hold. Follow.  #11 falls PT/OT. Due to patient's recent multiple falls will hold patient's anticoagulation and defer resumption to PCP.  #12 prophylaxis Lovenox for DVT prophylaxis.  #13 prognosis Patient presented with acute exacerbation of COPD in the setting of gold stage IV COPD and dementia with ongoing cachexia. Patient with a poor prognosis. Per pulmonary patient will likely qualify for hospice. Will consult with palliative care for goals of care.   Code Status: DO NOT RESUSCITATE Family Communication: No family at bedside. Updated family yesterday. Disposition Plan: Transfer to med surg. Hopefully to skilled nursing facility tomorrow if remains stable.   Consultants:  PCCM; Dr Halford Chessman 02/20/2015  Procedures:  Chest x-ray 02/20/2015, 02/21/2015  Antibiotics:  IV Levaquin 02/21/2015>>>02/22/2015 Oral levaquin  02/22/2015  HPI/Subjective: Patient is sleeping and easily arousable. Patient denies any chest pain or shortness of breath. Feeling much better. No complaints.  Objective: Filed Vitals:  02/22/15 0500 02/22/15 0800  BP:    Pulse:    Temp: 97.8 F (36.6 C) 97.6 F (36.4 C)  Resp:      Intake/Output Summary (Last 24 hours) at 02/22/15 0920 Last data filed at 02/22/15 0600  Gross per 24 hour  Intake 1730.83 ml  Output      0 ml  Net 1730.83 ml   Filed Weights   02/20/15 1317 02/21/15 0356  Weight: 60.9 kg (134 lb 4.2 oz) 62 kg (136 lb 11 oz)    Exam:   General:  Sleeping. Easily arousable.  Cardiovascular: Irregularly irregular with 3/6 SEM  Respiratory: Less rhonchorous. Less congested. Minimal wheezing anterior lung fields.  Abdomen: Soft, nontender, nondistended, positive bowel sounds.  Musculoskeletal: No clubbing cyanosis or edema.  Data Reviewed: Basic Metabolic Panel:  Recent Labs Lab 02/19/15 0856 02/20/15 0915 02/20/15 1409 02/21/15 0355 02/22/15 0348  NA 141 146*  --  148* 142  K 4.3 4.5  --  4.0 3.9  CL 94* 98*  --  102 97*  CO2 37* 35*  --  35* 36*  GLUCOSE 102* 101*  --  128* 163*  BUN 25* 30*  --  39* 43*  CREATININE 1.17* 1.27*  --  1.37* 1.26*  CALCIUM 10.2 9.6  --  8.7* 8.6*  MG  --   --  1.9  --  2.1  PHOS  --   --  4.9*  --   --    Liver Function Tests:  Recent Labs Lab 02/19/15 0856  AST 29  ALT 41  ALKPHOS 121  BILITOT 0.4  PROT 7.7  ALBUMIN 3.3*   No results for input(s): LIPASE, AMYLASE in the last 168 hours. No results for input(s): AMMONIA in the last 168 hours. CBC:  Recent Labs Lab 02/19/15 0856 02/20/15 0915 02/21/15 0355 02/22/15 0348  WBC 9.5 11.3* 7.2 10.1  NEUTROABS 7.1 8.3*  --   --   HGB 9.0* 10.0* 8.1* 8.1*  HCT 31.8* 34.5* 29.0* 27.3*  MCV 96.1 95.8 97.0 92.2  PLT 249 276 255 253   Cardiac Enzymes: No results for input(s): CKTOTAL, CKMB, CKMBINDEX, TROPONINI in the last 168 hours. BNP (last  3 results) No results for input(s): BNP in the last 8760 hours.  ProBNP (last 3 results) No results for input(s): PROBNP in the last 8760 hours.  CBG:  Recent Labs Lab 02/19/15 0827 02/22/15 0729  GLUCAP 92 131*    Recent Results (from the past 240 hour(s))  Culture, Urine     Status: None   Collection Time: 02/20/15  8:30 AM  Result Value Ref Range Status   Specimen Description URINE, CLEAN CATCH  Final   Special Requests NONE  Final   Culture   Final    MULTIPLE SPECIES PRESENT, SUGGEST RECOLLECTION Performed at [facility]    Report Status 02/21/2015 FINAL  Final  MRSA PCR Screening     Status: None   Collection Time: 02/20/15  1:35 PM  Result Value Ref Range Status   MRSA by PCR NEGATIVE NEGATIVE Final    Comment:        The GeneXpert MRSA Assay (FDA approved for NASAL specimens only), is one component of a comprehensive MRSA colonization surveillance program. It is not intended to diagnose MRSA infection nor to guide or monitor treatment for MRSA infections.      Studies: Portable Chest 1 View  02/21/2015  CLINICAL DATA:  Shortness of breath. EXAM: PORTABLE CHEST 1 VIEW  COMPARISON:  02/20/2015.  01/11/2015.  09/25/2010. FINDINGS: Persistent mild cardiomegaly and pulmonary vascular congestion with increased interstitial markings and small left pleural effusion. Findings consistent with a component of congestive heart failure. Low lung volumes with bibasilar atelectasis. A component of basilar pneumonia cannot be excluded. Underlying chronic interstitial lung disease is present. No pneumothorax . IMPRESSION: 1. Mild cardiomegaly with persistent mild pulmonary vascular prominence. Diffuse pulmonary interstitial prominence with small left pleural effusion. Findings suggest a component of congestive heart failure. 2. Low lung volumes with bibasilar atelectasis. A component of basilar pneumonia cannot be excluded. 3. Underlying chronic interstitial lung disease.  Electronically Signed   By: Marcello Moores  Register   On: 02/21/2015 07:09    Scheduled Meds: . antiseptic oral rinse  7 mL Mouth Rinse q12n4p  . aspirin  81 mg Oral Daily  . chlorhexidine  15 mL Mouth Rinse BID  . enoxaparin (LOVENOX) injection  30 mg Subcutaneous Q24H  . ipratropium  0.5 mg Nebulization QID  . levalbuterol  0.63 mg Nebulization QID  . levofloxacin (LEVAQUIN) IV  250 mg Intravenous Q24H  . methylPREDNISolone (SOLU-MEDROL) injection  40 mg Intravenous Q12H  . oseltamivir  30 mg Oral BID  . sodium chloride  3 mL Intravenous Q12H   Continuous Infusions: . dextrose 5 % 1,000 mL infusion 50 mL/hr at 02/22/15 2060    Principal Problem:   Acute on chronic respiratory failure with hypercapnia (HCC) Active Problems:   Hypertension   COPD (chronic obstructive pulmonary disease) (HCC)   Chronic respiratory failure (HCC)   Aortic stenosis   PAF (paroxysmal atrial fibrillation) (HCC)   CKD (chronic kidney disease), stage III   Altered mental status   Metabolic encephalopathy   Weakness generalized   Dehydration   Leukocytosis   Anemia   COPD exacerbation ([place])   COPD with acute exacerbation ([place])   Bronchitis   Fall at nursing home   Hypernatremia    Time spent: [place] MD [facility] Pager [phone]. If 7PM-7AM, please contact night-coverage at [email or web address], password [facility] 02/22/2015, 9:20 AM  LOS: 2 days

## 2015-02-22 NOTE — Progress Notes (Signed)
CSW spoke with [facility] and have advised them of need for SNF bed at dc- as well, advised them of tentative dc for tomorrow- CSW will follow up in morning for further [place], MSW, [place]

## 2015-02-22 NOTE — Progress Notes (Signed)
CSW attempted to see patient again today but she is sleeping soundly and unarousable. CSW had confirmed with [facility] that she is from ALF unit and should be able to dc back there in the SNF unit- CSW will follow up again tomorrow.   Eduard Clos, MSW, [place]

## 2015-02-23 DIAGNOSIS — R400 Somnolence: Secondary | ICD-10-CM

## 2015-02-23 LAB — BASIC METABOLIC PANEL
Anion gap: 6 (ref 5–15)
BUN: 40 mg/dL — AB (ref 6–20)
CHLORIDE: 98 mmol/L — AB (ref 101–111)
CO2: 39 mmol/L — AB (ref 22–32)
Calcium: 8.3 mg/dL — ABNORMAL LOW (ref 8.9–10.3)
Creatinine, Ser: 1.17 mg/dL — ABNORMAL HIGH (ref 0.44–1.00)
GFR calc non Af Amer: 43 mL/min — ABNORMAL LOW (ref >60)
GFR, EST AFRICAN AMERICAN: 50 mL/min — AB (ref >60)
GLUCOSE: 132 mg/dL — AB (ref 65–99)
Potassium: 4.1 mmol/L (ref 3.5–5.1)
Sodium: 143 mmol/L (ref 135–145)

## 2015-02-23 LAB — CBC
HEMATOCRIT: 27.6 % — AB (ref 36.0–46.0)
HEMOGLOBIN: 8.1 g/dL — AB (ref 12.0–15.0)
MCH: 27.2 pg (ref 26.0–34.0)
MCHC: 29.3 g/dL — AB (ref 30.0–36.0)
MCV: 92.6 fL (ref 78.0–100.0)
Platelets: 270 10*3/uL (ref 150–400)
RBC: 2.98 MIL/uL — ABNORMAL LOW (ref 3.87–5.11)
RDW: 14.7 % (ref 11.5–15.5)
WBC: 10.4 10*3/uL (ref 4.0–10.5)

## 2015-02-23 MED ORDER — IPRATROPIUM BROMIDE 0.02 % IN SOLN: 0.5000 mg | Freq: Four times a day (QID) | RESPIRATORY_TRACT | Status: DC

## 2015-02-23 MED ORDER — SENNOSIDES-DOCUSATE SODIUM 8.6-50 MG PO TABS: 1.0000 | ORAL_TABLET | Freq: Every evening | ORAL | Status: AC | PRN

## 2015-02-23 MED ORDER — LEVALBUTEROL HCL 0.63 MG/3ML IN NEBU: 0.6300 mg | INHALATION_SOLUTION | Freq: Four times a day (QID) | RESPIRATORY_TRACT | Status: DC

## 2015-02-23 MED ORDER — PREDNISONE 10 MG PO TABS: ORAL_TABLET | ORAL | Status: DC

## 2015-02-23 MED ORDER — ASPIRIN 81 MG PO CHEW: 81.0000 mg | CHEWABLE_TABLET | Freq: Every day | ORAL | Status: DC

## 2015-02-23 MED ORDER — LEVOFLOXACIN 250 MG PO TABS: 250.0000 mg | ORAL_TABLET | Freq: Every day | ORAL | Status: DC

## 2015-02-23 MED ORDER — LEVALBUTEROL HCL 0.63 MG/3ML IN NEBU: 0.6300 mg | INHALATION_SOLUTION | RESPIRATORY_TRACT | Status: DC | PRN

## 2015-02-23 MED ORDER — IPRATROPIUM BROMIDE 0.02 % IN SOLN: 0.5000 mg | RESPIRATORY_TRACT | Status: DC | PRN

## 2015-02-23 MED ORDER — OSELTAMIVIR PHOSPHATE 6 MG/ML PO SUSR
30.0000 mg | Freq: Two times a day (BID) | ORAL | Status: DC
Start: 2015-02-23 — End: 2015-10-28

## 2015-02-23 NOTE — Progress Notes (Signed)
Pt currently not on BIPAP at this time.

## 2015-02-23 NOTE — Progress Notes (Signed)
Patient for d/c today to SNF bed at [facility], sister in [place] and patient agreeable to this plan- plan transfer via EMS. Eduard Clos, MSW, [place]

## 2015-02-23 NOTE — Progress Notes (Signed)
Physical Therapy Treatment Patient Details Name: Paula Contreras MRN: [id number] DOB: May 10, 1933 Today's Date: 02/23/2015    History of Present Illness 81 yo old female admitted from ALF with COPD exacerbation; hx of dementia, HTN, CKD, atrial fibrillation on chronic anticoagulation with xarelto, and aortic stenosis.     PT Comments    Pt. Progressing with mobility; is modified independent/requiring min assist for bed mobility with HOB elevated and use of bed rails to bring trunk upright and scoot to EOB - no physical assist from PTA. Upon performing SPT from EOB to [facility], patient stated she thought she was falling forward at EOB with no associated physical movement and requires mod assist; mod assist with STS for initiation and to steady;  pt. ambulated from [facility] to door way and back to recliner ~9f requiring min assist (15-20%) for steady and safety as patient states she thinks she is going to fall multiple times with no assosciated physical behavior; RN notified pt. Voided and of pt Mobility.   Follow Up Recommendations  SNF;Supervision for mobility/OOB     Equipment Recommendations  None recommended by PT    Recommendations for Other Services       Precautions / Restrictions Precautions Precautions: Fall Precaution Comments: monitor sats Restrictions Weight Bearing Restrictions: No    Mobility  Bed Mobility Overal bed mobility: Modified Independent Bed Mobility: Supine to Sit     Supine to sit: Modified independent (Device/Increase time);Supervision;HOB elevated     General bed mobility comments: pt. uses bed rail with HOB elevated to bring trunk upright and to scoot to EOB; supervision for safety as patient states she is "falling forward" with no associated forward movement  Transfers Overall transfer level: Needs assistance Equipment used: Rolling walker (2 wheeled) Transfers: Sit to/from [facility]are[facility] to Stand: Mod assist Stand pivot transfers:  Mod assist       General transfer comment: pt requiring mod assist with STS for initiation to stand and to bring upright/steady; mod assist for upright trunk and stead in RW for SPT  Ambulation/Gait Ambulation/Gait assistance: Min assist;Supervision;Modified independent (Device/Increase time) Ambulation Distance (Feet): 12 Feet Assistive device: Rolling walker (2 wheeled) Gait Pattern/deviations: Step-to pattern;Decreased stride length Gait velocity: decreased    [facility] Details[facility] ~168frequiring min assist (15-20%) for steady and safety as patient states she thinks she is going to fall multiple times with no assosciated physical behavior    Stairs            Wheelchair Mobility    Modified Rankin (Stroke Patients Only)       Balance                                    Cognition Arousal/Alertness: Awake/alert Behavior During Therapy: WFL for tasks assessed/performed;Anxious (multiple statements from patient about thinking she was going to fall ) Overall Cognitive Status: Within Functional Limits for tasks assessed                      Exercises      General Comments        Pertinent Vitals/Pain Pain Assessment: No/denies pain    Home Living                      Prior Function  PT Goals (current goals can now be found in the care plan section) Acute Rehab PT Goals Time For Goal Achievement: 03/07/15 Potential to Achieve Goals: Good Progress towards PT goals: Progressing toward goals    Frequency  Min 3X/week    PT Plan Current plan remains appropriate    Co-evaluation             End of Session Equipment Utilized During Treatment: Gait belt;Oxygen Activity Tolerance: Patient tolerated treatment well;Other (comment) (anxiety/fear of falling limits patients mobility ) Patient left: in chair;with call bell/phone within reach;with chair alarm  set;with nursing/sitter in room     Time:  - 9:05 - 9:45    Charges:    2 ta      1 ta                   G CodesDenna Haggard, SPTA   02/23/2015 1:49 PM   Pager: [phone]     Reviewed above and agree Rica Koyanagi  PTA [facility]  Acute  Rehab Pager      [phone]

## 2015-02-23 NOTE — Discharge Instructions (Signed)

## 2015-02-23 NOTE — Progress Notes (Signed)
Report given to receiving nurse at [facility] SNF. All RN questions answered. IV removed. EMS notified.

## 2015-02-23 NOTE — Discharge Summary (Signed)
Physician Discharge Summary  Yashira Offenberger VHQ:[id number] DOB: 1933/04/15 DOA: 02/20/2015  PCP: Vena Austria, MD  Admit date: 02/20/2015 Discharge date: 02/23/2015  Recommendations for Outpatient Follow-up:  1. Pt will need to follow up with PCP in 2-3 weeks post discharge 2. Please obtain BMP to evaluate electrolytes and kidney function 3. Please also check CBC to evaluate Hg and Hct levels 4. Pt to remain on oxygen and titrate to keep saturation > 90% 5. Pt to complete two days of Tamiflu which will complete full course 6. Pt also to complete Levaquin for 7 more days post discharge 7. Pt is currently on BD's Ipratropium and Levalbuterol scheduled and as needed, if she continues to improve, may taper down to as needed regimen only  8. Please also note the Xarelto has been stopped as pt was determined to be high risk bleed, also pt with high risk fall, pt started on Aspirin instead  9. Pt was seen by palliative care team while inpatient, DNR code status confirmed  10. Pt was also discharged on prolonged prednisone taper with instructions outlined below   Discharge Diagnoses:  Principal Problem:   Acute on chronic respiratory failure with hypercapnia (HCC) Active Problems:   Hypertension   COPD (chronic obstructive pulmonary disease) (HCC)   Chronic respiratory failure (HCC)   Aortic stenosis   PAF (paroxysmal atrial fibrillation) (HCC)   CKD (chronic kidney disease), stage III   Altered mental status  Discharge Condition: Stable  Diet recommendation: Heart healthy diet discussed in details   History of present illness:  Patient is 81 yo female with chronic respiratory failure from with COPD stage IV on chronic home O2 3 L nasal cannula, chronic kidney disease, A. fib on chronic anticoagulation, hypertension, aortic stenosis with recent multiple falls presented from assisted nursing facility at [facility] with an acute metabolic encephalopathy, generalized weakness. Patient  on admission noted to be in acute on chronic respiratory failure secondary to an acute COPD exacerbation with purulent bronchitis. Patient was admitted to the step down unit had to be placed on the BiPAP over 24 hours placed on IV steroids, IV antibiotics, scheduled nebulizers, O2, chest PT. Pulmonary was consulted and followed the patient during the hospitalization. Critical care team signed off as they could not offer any additional assistance and conservative management recommended.   Assessment/Plan: #1 acute on chronic hypoxic/hypercarbic respiratory failure - secondary to an acute COPD exacerbation - Patient more alert and interactive and answering questions appropriately. - has required BiPAP yesterday but currently stable on oxygen via [place] - will continue BD's scheduled and as needed - also complete course with Tamiflu and Levaquin - Prednisone tapering outlined below  #2 acute COPD exacerbation with purulent bronchitis in the setting of gold stage IV COPD and dementia - Patient with gold stage IV COPD and dementia with ongoing cachexia - Palliative care consulted - management as noted above   #3 hypernatremia - Likely secondary to dehydration - resolved with IVF   #4 dehydration - resolved   #5 anemia of chronic disease, IDA - no signs of bleeding  #6 atrial fibrillation - Currently rate controlled. Patient recently with a history of multiple falls and dementia.  - AC stopped and pt placed on Aspirin   #7 acute metabolic encephalopathy - Likely secondary to #1. Clinical improvement. Patient likely close to baseline. - can be d/c to SNF today   #8 chronic kidney disease stage III - remains stable while inpatient and Cr even trending down   #9  leukocytosis - likely from bronchitis and pt also no steroids - no need for monitoring if pt clinically stable   #10 hypertension - resume home medical regimen   #11 falls - Due to patient's recent multiple falls stopped  patient's anticoagulation   Code Status: DO NOT RESUSCITATE Family Communication: No family at bedside. Updated family 11/21 Disposition Plan: SNF   Consultants:  PCCM; Dr Halford Chessman 02/20/2015 Antibiotics:  IV Levaquin 02/21/2015>>>02/22/2015  Oral levaquin 02/22/2015   Procedures/Studies: Dg Chest 2 View  02/20/2015  CLINICAL DATA:  Altered mental status.  COPD.  The patient fell. EXAM: CHEST  2 VIEW COMPARISON:  02/19/2015 and  01/11/2015 and 12/27/2014 FINDINGS: Emphysema with diffuse chronic interstitial lung disease as well. New tiny left effusion. Pulmonary vascularity appears increased. Chronic accentuation of the thoracic kyphosis with old compression fractures. Extensive aortic atherosclerosis. IMPRESSION: Increased pulmonary vascular congestion superimposed on severe chronic lung disease. New tiny left effusion. Electronically Signed   By: Lorriane Shire M.D.   On: 02/20/2015 09:29   Dg Chest 2 View  02/19/2015  CLINICAL DATA:  Dizziness and confusion. EXAM: CHEST  2 VIEW COMPARISON:  01/11/2015 FINDINGS: The heart size is normal. Aortic atherosclerosis noted. No pleural effusion or edema. The lungs are hyperinflated. There are coarsened interstitial markings identified bilaterally compatible with COPD/emphysema. Multi level compression deformities noted within the lower thoracic and upper lumbar spine. IMPRESSION: 1. Advanced changes of COPD/emphysema. 2. Aortic atherosclerosis 3. Age indeterminate thoracic compression deformities. Electronically Signed   By: Kerby Moors M.D.   On: 02/19/2015 09:44   Ct Head Wo Contrast  02/19/2015  CLINICAL DATA:  Fall and confusion.  Dizziness for 48 hours. EXAM: CT HEAD WITHOUT CONTRAST TECHNIQUE: Contiguous axial images were obtained from the base of the skull through the vertex without contrast. COMPARISON:  None FINDINGS: Diffuse low-density throughout the periventricular and subcortical white matter. No evidence for acute hemorrhage, mass  lesion, midline shift, hydrocephalus or large infarct. Scattered areas of low-density in the cerebellum, particularly along the left cerebellar peduncle is nonspecific. Fluid in the right mastoid air cells. Mucosal thickening involving the right maxillary sinus, ethmoid air cells, right sphenoid sinus and right frontal sinus. No acute bone abnormality. IMPRESSION: No acute intracranial abnormality. Extensive white matter disease possibly representing small vessel ischemic changes. Extensive paranasal sinus disease and there is fluid in the right mastoid air cells. Electronically Signed   By: Markus Daft M.D.   On: 02/19/2015 09:49   Portable Chest 1 View  02/21/2015  CLINICAL DATA:  Shortness of breath. EXAM: PORTABLE CHEST 1 VIEW COMPARISON:  02/20/2015.  01/11/2015.  09/25/2010. FINDINGS: Persistent mild cardiomegaly and pulmonary vascular congestion with increased interstitial markings and small left pleural effusion. Findings consistent with a component of congestive heart failure. Low lung volumes with bibasilar atelectasis. A component of basilar pneumonia cannot be excluded. Underlying chronic interstitial lung disease is present. No pneumothorax . IMPRESSION: 1. Mild cardiomegaly with persistent mild pulmonary vascular prominence. Diffuse pulmonary interstitial prominence with small left pleural effusion. Findings suggest a component of congestive heart failure. 2. Low lung volumes with bibasilar atelectasis. A component of basilar pneumonia cannot be excluded. 3. Underlying chronic interstitial lung disease. Electronically Signed   By: Marcello Moores  Register   On: 02/21/2015 07:09   Dg Foot Complete Left  02/19/2015  CLINICAL DATA:  Foot pain secondary to a fall. EXAM: LEFT FOOT - COMPLETE 3+ VIEW COMPARISON:  None. FINDINGS: There is no evidence of fracture or dislocation. Degenerative arthritis at  the first metatarsal phalangeal joint. Soft tissues are unremarkable. Diffuse osteopenia. IMPRESSION: No  acute abnormality. Electronically Signed   By: Lorriane Shire M.D.   On: 02/19/2015 09:45   Dg Foot Complete Right  02/19/2015  CLINICAL DATA:  Dizziness and confusion x48hrs. Fall at assisted living home. Hx of dementia, COPD, Chronic Respiratory Failure, Breast surgery 1964, Hip fracture unstated laterality. Former smoker 1 pack/ day for 55 years. Bilateral foot pain. EXAM: RIGHT FOOT COMPLETE - 3+ VIEW COMPARISON:  None. FINDINGS: Bones are diffusely demineralized. There is no fracture dislocation. There are no significant arthropathic changes. Soft tissues are unremarkable. IMPRESSION: No acute fracture or dislocation. Electronically Signed   By: Lajean Manes M.D.   On: 02/19/2015 09:44   Mm Diag Breast Tomo Uni Right  02/10/2015  CLINICAL DATA:  Possible mass in the retroareolar right breast on a recent 2D screening mammogram. EXAM: DIGITAL DIAGNOSTIC RIGHT MAMMOGRAM WITH 3D TOMOSYNTHESIS AND CAD COMPARISON:  Previous exam(s). ACR Breast Density Category c: The breast tissue is heterogeneously dense, which may obscure small masses. FINDINGS: 3D tomographic images of the right breast demonstrate normal appearing fibroglandular tissue at the location of the recently suspected mass in the retroareolar region. Mammographic images were processed with CAD. IMPRESSION: No evidence of malignancy. The recently suspected right breast mass was overlapping normal glandular tissue. RECOMMENDATION: Bilateral screening mammogram in 1 year. I have discussed the findings and recommendations with the patient. Results were also provided in writing at the conclusion of the visit. If applicable, a reminder letter will be sent to the patient regarding the next appointment. BI-RADS CATEGORY  1: Negative. Electronically Signed   By: Claudie Revering M.D.   On: 02/10/2015 11:02   Dg Hip Unilat With Pelvis 2-3 Views Right  02/19/2015  CLINICAL DATA:  Dizziness and confusion x48hrs. Fall at assisted living home. Hx of dementia,  COPD, Chronic Respiratory Failure, Breast surgery 1964, Hip fracture unstated laterality. Former smoker 1 pack/ day for 55 years. Bilateral foot pain. EXAM: DG HIP (WITH OR WITHOUT PELVIS) 2-3V RIGHT COMPARISON:  None. FINDINGS: No acute fracture. Old right proximal femur fracture has been reduced with 2 screws. Fracture is well healed. The hip joints, SI joints and symphysis pubis are normally spaced and aligned. Bones are diffusely demineralized. Soft tissues are unremarkable. IMPRESSION: 1. No acute fracture or dislocation. Electronically Signed   By: Lajean Manes M.D.   On: 02/19/2015 09:43    Discharge Exam: Filed Vitals:   02/23/15 0600 02/23/15 0800  BP:    Pulse: 70   Temp:  98 F (36.7 C)  Resp: 16    Filed Vitals:   02/23/15 0500 02/23/15 0600 02/23/15 0739 02/23/15 0800  BP:      Pulse: 69 70    Temp:    98 F (36.7 C)  TempSrc:    Oral  Resp: 14 16    Height:      Weight:      SpO2: 100% 100% 100%     General: Pt is alert, follows commands appropriately, not in acute distress Cardiovascular: Regular rate and rhythm, SEM 3/6, no rubs, no gallops Respiratory: Clear to auscultation bilaterally, no wheezing, diminished breath sounds at bases  Abdominal: Soft, non tender, non distended, bowel sounds +, no guarding  Discharge Instructions  Discharge Instructions    Diet - low sodium heart healthy    Complete by:  As directed      Increase activity slowly    Complete by:  As directed             Medication List    STOP taking these medications        XARELTO 15 MG Tabs tablet  Generic drug:  Rivaroxaban      TAKE these medications        acetaminophen 325 MG tablet  Commonly known as:  TYLENOL  Take 650 mg by mouth every 6 (six) hours as needed (for pain).     alendronate 70 MG tablet  Commonly known as:  FOSAMAX  Take 70 mg by mouth every Monday. Take with a full glass of water on an empty stomach.     aspirin 81 MG chewable tablet  Chew 1 tablet (81  mg total) by mouth daily.     calcium carbonate 600 MG Tabs tablet  Commonly known as:  OS-CAL  Take 2 tablets by mouth daily with breakfast.     ipratropium 0.02 % nebulizer solution  Commonly known as:  ATROVENT  Take 2.5 mLs (0.5 mg total) by nebulization every 2 (two) hours as needed for wheezing or shortness of breath.     ipratropium 0.02 % nebulizer solution  Commonly known as:  ATROVENT  Take 2.5 mLs (0.5 mg total) by nebulization 4 (four) times daily.     lactose free nutrition Liqd  Take 237 mLs by mouth 2 (two) times daily between meals.     levalbuterol 0.63 MG/3ML nebulizer solution  Commonly known as:  XOPENEX  Take 3 mLs (0.63 mg total) by nebulization every 2 (two) hours as needed for wheezing or shortness of breath.     levalbuterol 0.63 MG/3ML nebulizer solution  Commonly known as:  XOPENEX  Take 3 mLs (0.63 mg total) by nebulization 4 (four) times daily.     levofloxacin 250 MG tablet  Commonly known as:  LEVAQUIN  Take 1 tablet (250 mg total) by mouth daily.     oseltamivir 6 MG/ML Susr suspension  Commonly known as:  TAMIFLU  Take 5 mLs (30 mg total) by mouth 2 (two) times daily.     OXYGEN  Inhale 2-3 L into the lungs continuous. Change to 3L when on portable     predniSONE 10 MG tablet  Commonly known as:  DELTASONE  Take 50 mg tablet 02/24/2015 and taper down by 5 mg daily until completed     senna-docusate 8.6-50 MG tablet  Commonly known as:  Senokot-S  Take 1 tablet by mouth at bedtime as needed for mild constipation.     Tiotropium Bromide Monohydrate 1.25 MCG/ACT Aers  Commonly known as:  SPIRIVA RESPIMAT  Inhale 2 puffs into the lungs daily.     Vitamin D 2000 UNITS Caps  Take 2,000 Units by mouth daily with breakfast.      ASK your doctor about these medications        albuterol 108 (90 BASE) MCG/ACT inhaler  Commonly known as:  PROAIR HFA  Inhale 2 puffs into the lungs every 4 (four) hours as needed for wheezing or shortness of  breath.            Follow-up Information    Follow up with [facility].   Specialty:  Skilled Nursing Facility   Contact information:   [place] [phone]      Follow up with Vena Austria, MD.   Specialty:  Family Medicine   Contact information:   [phone] [address]  [place] [address] [phone]        The results of significant diagnostics from this hospitalization (including imaging, microbiology, ancillary and laboratory) are listed below for reference.     Microbiology: Recent Results (from the past 240 hour(s))  Culture, Urine     Status: None   Collection Time: 02/20/15  8:30 AM  Result Value Ref Range Status   Specimen Description URINE, CLEAN CATCH  Final   Special Requests NONE  Final   Culture   Final    MULTIPLE SPECIES PRESENT, SUGGEST RECOLLECTION Performed at [facility]    Report Status 02/21/2015 FINAL  Final  MRSA PCR Screening     Status: None   Collection Time: 02/20/15  1:35 PM  Result Value Ref Range Status   MRSA by PCR NEGATIVE NEGATIVE Final    Comment:        The GeneXpert MRSA Assay (FDA approved for NASAL specimens only), is one component of a comprehensive MRSA colonization surveillance program. It is not intended to diagnose MRSA infection nor to guide or monitor treatment for MRSA infections.      Labs: Basic Metabolic Panel:  Recent Labs Lab 02/19/15 0856 02/20/15 0915 02/20/15 1409 02/21/15 0355 02/22/15 0348 02/23/15 0330  NA 141 146*  --  148* 142 143  K 4.3 4.5  --  4.0 3.9 4.1  CL 94* 98*  --  102 97* 98*  CO2 37* 35*  --  35* 36* 39*  GLUCOSE 102* 101*  --  128* 163* 132*  BUN 25* 30*  --  39* 43* 40*  CREATININE 1.17* 1.27*  --  1.37* 1.26* 1.17*  CALCIUM 10.2 9.6  --  8.7* 8.6* 8.3*  MG  --   --  1.9  --  2.1  --   PHOS  --   --  4.9*  --   --   --    Liver Function Tests:  Recent Labs Lab  02/19/15 0856  AST 29  ALT 41  ALKPHOS 121  BILITOT 0.4  PROT 7.7  ALBUMIN 3.3*   CBC:  Recent Labs Lab 02/19/15 0856 02/20/15 0915 02/21/15 0355 02/22/15 0348 02/23/15 0330  WBC 9.5 11.3* 7.2 10.1 10.4  NEUTROABS 7.1 8.3*  --   --   --   HGB 9.0* 10.0* 8.1* 8.1* 8.1*  HCT 31.8* 34.5* 29.0* 27.3* 27.6*  MCV 96.1 95.8 97.0 92.2 92.6  PLT 249 276 255 253 270   CBG:  Recent Labs Lab 02/19/15 0827 02/22/15 0729  GLUCAP 92 131*   SIGNED: Time coordinating discharge: 30 minutes  MAGICK-Jeramey Lanuza, MD  [facility] 02/23/2015, 9:31 AM Pager [phone]  If 7PM-7AM, please contact night-coverage [email or web address] Password [email or web address]

## 2015-03-22 ENCOUNTER — Ambulatory Visit
Admission: RE | Admit: 2015-03-22 | Discharge: 2015-03-22 | Disposition: A | Discharge status: Home / Self Care | Payer: Medicare Other | Source: Ambulatory Visit | Attending: Family Medicine | Admitting: Family Medicine

## 2015-03-22 DIAGNOSIS — M810 Age-related osteoporosis without current pathological fracture: Secondary | ICD-10-CM

## 2015-05-30 ENCOUNTER — Encounter: Payer: Self-pay | Admitting: Emergency Medicine

## 2015-05-30 ENCOUNTER — Ambulatory Visit (INDEPENDENT_AMBULATORY_CARE_PROVIDER_SITE_OTHER): Payer: Medicare Other | Admitting: Emergency Medicine

## 2015-05-30 VITALS — BP 130/84 | HR 64 | Ht 67.0 in | Wt 141.0 lb

## 2015-05-30 DIAGNOSIS — J449 Chronic obstructive pulmonary disease, unspecified: Secondary | ICD-10-CM | POA: Diagnosis not present

## 2015-05-30 NOTE — Progress Notes (Signed)
Subjective:    Patient ID: Paula Contreras, female    DOB: 09/03/1933, 82 y.o.   MRN: [id number] HPI 82 yo longstanding smoker (~100 pk-yrs), never dx w COPD before but was admitted to [facility] 6/12. Found to be hypoxic, started on O2 and on combivent bid + pulmicort. Minimal exertional tolerance. Had been dealing w fatigue, malaise - may be a bit better since the hospitalization.     ROV 04/16/13 -- hx COPD, has been managed on prn BD's + O2, used to be on pulmicort. She has not had any flares or hospitalizations. She has SOB w exertion and doing chores. She never uses her duonebs. Discussed today that she is likely undertreated for her COPD.   ROV 06/16/13 -- COPD, returns for eval after starting Anoro last visit. She reports today that she believes the Anoro helped > was able to do more walking, less dyspnea. She would like to continue it to see how she does.   ROV 09/15/13 -- follow up visit for COPD and hypoxemia. She has A fib, HTN.  She has been doing fairly well although has not tolerated the very hot weather. She is having SOB with bathing and getting ready in the am, going up and down stairs. Her O2 is set on 2L/min at all times.  She ran out of Anoro several days ago, but is about to refill it and restart it. No flares since last time. No hospitalizations. She was started on xarelto for her A Fib since last time. Rare cough, never wheezes. Discussed prevnar today.   ROV 02/02/14 -- follows for her COPD, hypoxemia.  She feels that she has been at her baseline - no flares. She has some throat irritation, appears to be chronic. She has never used her SABA, has never felt that she needed it. She got the prevnar w Dr Alyson Ingles in 10/15. We discussed restarting Anoro last time, but she did not do this - she is curently on no BD at all.   ROV 08/31/14 -- follow-up visit for chronic dyspnea and chronic hypoxemic respiratory failure in the setting of COPD, atrial fibrillation, moderate aortic stenosis,  hypertension, chronic renal insufficiency. Last time she had come off of all of her bronchodilators and I asked her to restart Anoro, but she is not on it. History today is taken from the patient and her friend Paula Contreras. She is wearing oxygen. She states that her breathing has ups and downs, has been doing fairly well. By contrast her friend Paula Contreras tells me that she gets significantly dyspneic with just walking back and forth to the restroom. She has albuterol but never uses it.   I reviewed her labs from 07/13/14, show a mild metabolic alkalosis and a serum creatinine of 1.54, GFR 34.5.  She hasn't had a chest x-ray in our system since 2012.   ROV 12/01/14 -- follow-up visit for chronic hypoxemic respiratory failure, chronic dyspnea, COPD. She also has superimposed atrial fibrillation, hypertension and moderate aortic stenosis that contribute to her overall dyspnea. At her last visit we increased her supple oxygen to 3 L/m when she gets her pulse system. We also added Spiriva respimat to see if she would benefit. In particular I was hopeful that her consistency with the medication would be improved now that she is in assisted living. She believes that she is breathing a bit better. She admits that she is not very active, doesn't walk much. She isn't sure that she is taking the medication correctly, not really  inhaling it, just holding it in her mouth. No real cough or wheeze. No exacerbations       ROV 05/30/15 -- follow-up visit for COPD. She has chronic hypoxemic respiratory failure in the setting of this as well as atrial fibrillation, hypertension, aortic stenosis. She was seen by Dr Vaughan Browner in 9/16 for an acute exacerbation of her COPD, was treated with prednisone taper.  She was hospitalized 11/'16. She is on spiriva daily, wears her o2 24x7. She gets SOB when she changes clothes, she uses SABA (ipratropium + xopenex) about once a day.                                                                                                            Objective:   Physical Exam Filed Vitals:   05/30/15 1038 05/30/15 1039  BP:  130/84  Pulse:  64  Height: '5\' 7"'$  (1.702 m)   Weight: 141 lb (63.957 kg)   SpO2:  95%    Gen: Pleasant, thin, in no distress,  normal affect, mild kyphosis  ENT: No lesions,  mouth clear,  oropharynx clear, no postnasal drip,  Neck: No JVD, no TMG, no carotid bruits  Lungs: distant no wheezing  Cardiovascular: RRR, heart sounds normal, no murmur or gallops, no peripheral edema  Musculoskeletal: No deformities, no cyanosis or clubbing  Neuro: alert, non focal  Skin: Warm, no lesions or rashes   Assessment & Plan:  COPD (chronic obstructive pulmonary disease) (HCC) Quite labile at baseline but does appear to be at baseline. She has significant exertional dyspnea, class IV symptoms.. We will continue her current maintenance regimen as below  Please continue your Spiriva 2 puffs, once a day  Please continue to use your xopenex / ipratropium nebulizers up to every 6 hours if needed for shortness of breath.  Please continue your oxygen at 2.5 - 3.0 L/min at all times.  Follow with Dr Lamonte Sakai in 6 months or sooner if you have any problems

## 2015-05-30 NOTE — Patient Instructions (Signed)
Please continue your Spiriva 2 puffs, once a day  Please continue to use your xopenex / ipratropium nebulizers up to every 6 hours if needed for shortness of breath.  Please continue your oxygen at 2.5 - 3.0 L/min at all times.  Follow with Dr Lamonte Sakai in 6 months or sooner if you have any problems

## 2015-05-30 NOTE — Assessment & Plan Note (Signed)
Quite labile at baseline but does appear to be at baseline. She has significant exertional dyspnea, class IV symptoms.. We will continue her current maintenance regimen as below  Please continue your Spiriva 2 puffs, once a day  Please continue to use your xopenex / ipratropium nebulizers up to every 6 hours if needed for shortness of breath.  Please continue your oxygen at 2.5 - 3.0 L/min at all times.  Follow with Dr Lamonte Sakai in 6 months or sooner if you have any problems

## 2015-10-27 ENCOUNTER — Encounter: Payer: Self-pay | Admitting: Physician Assistant

## 2015-10-27 DIAGNOSIS — Z8679 Personal history of other diseases of the circulatory system: Secondary | ICD-10-CM

## 2015-10-27 DIAGNOSIS — Z87898 Personal history of other specified conditions: Secondary | ICD-10-CM | POA: Insufficient documentation

## 2015-10-27 NOTE — Progress Notes (Addendum)
Cardiology Office Note    Date:  10/28/2015  ID:  Paula Contreras, DOB [birth year], MRN [id number] PCP:  Vena Austria, MD  Cardiologist: Irish Lack  Chief Complaint: f/u afib  History of Present Illness:  Paula Contreras is a 82 y.o. female with history of paroxysmal atrial fibrillation, HTN, COPD with chronic respiratory failure on home O2 since 2012, CKD stage III, anemia, and moderate AS by echo 07/2013. Per notes she was diagnosed with AF by PCP in 07/2013 and seen by Dr. Irish Lack at that time. She has not had cardioversion before but was in NSR at [facility] in 2016. She has history of possible bradycardia with HR 44 by physical exam notes at nephrologist office in 03/2014. F/u event monitor showed NSR PACs average HR 81, bradycardia <1% of readable data, no pauses of 3 seconds or longer. She also has history of sinus tach. Last echo 07/2013: EF 60-65%, grade 1 DD, mod AS, PASP 31. She recently saw her PCP and given her history of afib she was referred back for routine follow-up.  She comes back in accompanied by her sister-in-law. She is very fiesty as well as hard of hearing. She denies any CP, SOB, palpitations or LEE. No syncope. She reports intermittent spotty rectal bleeding when she wipes - she says she has told her regular doctor but doesn't think they are doing anything about it. She denies prior h/o colonscopy - states she doesn't want one. She reports her BP is checked regularly at her ALF.  Labs from ALF 09/2015: Na 142, K 4.5, BUN 23, Cr 1.25. Hgb in 03/2015 was 8.7.   Past Medical History:  Diagnosis Date  . Anemia 02/20/2015  . Aortic stenosis    a. 2D Echo 07/2013: EF 60-65%, no RWMA, grade 1 DD, normal LV filling pressure, moderate AS, mild TR, PASP 54mHg.  . Bradycardia    a. possible bradycardia with HR 44 by physical exam notes at nephrologist office in 03/2014. F/u event monitor showed NSR PACs average HR 81, bradycardia <1% of readable data, no pauses of 3 seconds or  longer..  . Cataract   . Chronic respiratory failure ([facility]    a. On home O2 since 2012.  . CKD (chronic kidney disease), stage III   . COPD (ch[place] obstructive pulmonary disease) ([facility]   . Hypertension   . Other psoriasis and similar disorders    psoriasis vs eczema  . Paroxysmal atrial fibrillation ([facility] [place]    a. Dx 07/2013. No attempts per Epic to restore NSR but in NSR 2016.    Past Surgical History:  Procedure Laterality Date  . BREAST SURGERY  1964  . CATARACT EXTRACTION    . HIP SURGERY     fracture    Current Medications: Current Outpatient Prescriptions  Medication Sig Dispense Refill  . acetaminophen (TYLENOL) 325 MG tablet Take 650 mg by mouth every 6 (six) hours as needed (for pain).     .Marland Kitchenalbuterol (PROAIR HFA) 108 (90 BASE) MCG/ACT inhaler Inhale 2 puffs into the lungs every 4 (four) hours as needed for wheezing or shortness of breath. 1 Inhaler 3  . alendronate (FOSAMAX) 70 MG tablet Take 70 mg by mouth every Monday. Take with a full glass of water on an empty stomach.    .Marland Kitchenaspirin 81 MG chewable tablet Chew 1 tablet (81 mg total) by mouth daily. 30 tablet 0  . calcium carbonate (OS-CAL) 600 MG TABS tablet Take 2 tablets by mouth daily with breakfast.     .  Cholecalciferol (VITAMIN D) 2000 UNITS CAPS Take 2,000 Units by mouth daily with breakfast.     . ipratropium (ATROVENT) 0.02 % nebulizer solution Take 2.5 mLs (0.5 mg total) by nebulization every 2 (two) hours as needed for wheezing or shortness of breath. 75 mL 12  . ipratropium (ATROVENT) 0.02 % nebulizer solution Take 2.5 mLs (0.5 mg total) by nebulization 4 (four) times daily. 75 mL 12  . lactose free nutrition (BOOST) LIQD Take 237 mLs by mouth 2 (two) times daily between meals.    Marland Kitchen levalbuterol (XOPENEX) 0.63 MG/3ML nebulizer solution Take 3 mLs (0.63 mg total) by nebulization every 2 (two) hours as needed for wheezing or shortness of breath. 3 mL 12  . OXYGEN Inhale 2-3 L into the lungs continuous. Change to  3L when on portable    . Rivaroxaban (XARELTO) 15 MG TABS tablet Take 15 mg by mouth daily.    Marland Kitchen senna-docusate (SENOKOT-S) 8.6-50 MG tablet Take 1 tablet by mouth at bedtime as needed for mild constipation.    . Tiotropium Bromide Monohydrate (SPIRIVA RESPIMAT) 1.25 MCG/ACT AERS Inhale 2 puffs into the lungs daily. 1 Inhaler 2   No current facility-administered medications for this visit.      Allergies:   Codeine   Social History   Social History  . Marital status: Unknown    Spouse name: N/A  . Number of children: N/A  . Years of education: N/A   Occupational History  . data entry specialist     retired   Social History Main Topics  . Smoking status: Former Smoker    Packs/day: 1.00    Years: 55.00    Types: Cigarettes    Quit date: 09/25/2010  . Smokeless tobacco: Never Used  . Alcohol use No  . Drug use: No  . Sexual activity: Not Asked   Other Topics Concern  . None   Social History Narrative  . None     Family History:  The patient's family history includes Asthma in her maternal grandmother and mother; Lung cancer in her father and paternal uncle.   ROS:   Please see the history of present illness.  All other systems are reviewed and otherwise negative.    PHYSICAL EXAM:   VS:  BP (!) 150/72   Pulse 67   Ht '5\' 7"'$  (1.702 m)   Wt 162 lb 3.2 oz (73.6 kg)   BMI 25.40 kg/m   BMI: Body mass index is 25.4 kg/m. GEN: Well nourished, well developed WF, in no acute distress  HEENT: normocephalic, atraumatic Neck: no JVD, carotid bruits, or masses Cardiac: RRR; no murmurs, rubs, or gallops, no edema  Respiratory:  clear to auscultation bilaterally, normal work of breathing - on home o2 GI: soft, nontender, nondistended, + BS MS: no deformity or atrophy  Skin: warm and dry, no rash Neuro:  Alert and Oriented x 3, Strength and sensation are intact, follows commands, very hard of hearing Psych: euthymic mood, full affect  Wt Readings from Last 3 Encounters:    10/28/15 162 lb 3.2 oz (73.6 kg)  05/30/15 141 lb (64 kg)  02/23/15 139 lb 1.8 oz (63.1 kg)      Studies/Labs Reviewed:   EKG:  EKG was ordered today and personally reviewed by me and demonstrates NSR 67bpm with snus arrhythmia, no acute changes. Rhythm strip was done which does seem to confirm NSR (baseline tremor appears to hide P's at times) Recent Labs: 02/19/2015: ALT 41 02/20/2015: TSH 0.927 02/22/2015:  Magnesium 2.1 02/23/2015: BUN 40; Creatinine, Ser 1.17; Hemoglobin 8.1; Platelets 270; Potassium 4.1; Sodium 143    Lipid Panel No results found for: CHOL, TRIG, HDL, CHOLHDL, VLDL, LDLCALC, LDLDIRECT  Additional studies/ records that were reviewed today include: Summarized above.    ASSESSMENT & PLAN:   1. Paroxysmal atrial fib - EKG appears to be NSR today (somewhat skewed with baseline tremor artifact). Continue low dose Xarelto. Somehow aspirin is now on her med list as well. In light of report of hematochezia will discontinue aspirin. Will also f/u Hgb today.  2. Possible h/o bradycardia - f/u event monitor after that report was negative for significant bradycardia. No symptoms to suggest any recurrence. 3. Moderate aortic stenosis - no symptoms. Has not progressed by exam. Continue to monitor clinically. 4. Essential HTN - BP running higher in clinic today. We have called [facility] to see if they can fax recent readings. If they have been running higher, would consider addition of low dose amlodipine. I requested the home call if SBP running >801 systolic. Addendum: we have received the faxed BPs, mostly well controlled - [phone] [id number] diastolic. Will hold off on any med changes right now. 5. CKD stage III - f/u Cr today to ensure still safe for Xarelto. 6. Spotty hematochezia - f/u Hgb. If significant drop (note previously anemic), will need to hold Xarelto. D/c aspirin as there is no present indication for her to be on this with concomitant Xarelto. Refer to GI.     Disposition: F/u with Dr. Irish Lack in 6 months.    Medication Adjustments/Labs and Tests Ordered: Current medicines are reviewed at length with the patient today.  Concerns regarding medicines are outlined above. Medication changes, Labs and Tests ordered today are summarized above and listed in the Patient Instructions accessible in Encounters.   Raechel Ache PA-C  10/28/2015 10:57 AM    [address], [place], [place]  [address] Phone: [phone]; Fax: [phone]

## 2015-10-28 ENCOUNTER — Ambulatory Visit (INDEPENDENT_AMBULATORY_CARE_PROVIDER_SITE_OTHER): Payer: Medicare Other | Admitting: Physician Assistant

## 2015-10-28 ENCOUNTER — Encounter: Payer: Self-pay | Admitting: Physician Assistant

## 2015-10-28 VITALS — BP 150/72 | HR 67 | Ht 67.0 in | Wt 162.2 lb

## 2015-10-28 DIAGNOSIS — Z8679 Personal history of other diseases of the circulatory system: Secondary | ICD-10-CM

## 2015-10-28 DIAGNOSIS — N183 Chronic kidney disease, stage 3 unspecified: Secondary | ICD-10-CM

## 2015-10-28 DIAGNOSIS — I480 Paroxysmal atrial fibrillation: Secondary | ICD-10-CM

## 2015-10-28 DIAGNOSIS — I10 Essential (primary) hypertension: Secondary | ICD-10-CM

## 2015-10-28 DIAGNOSIS — K921 Melena: Secondary | ICD-10-CM

## 2015-10-28 DIAGNOSIS — K625 Hemorrhage of anus and rectum: Secondary | ICD-10-CM

## 2015-10-28 DIAGNOSIS — I350 Nonrheumatic aortic (valve) stenosis: Secondary | ICD-10-CM | POA: Diagnosis not present

## 2015-10-28 DIAGNOSIS — Z87898 Personal history of other specified conditions: Secondary | ICD-10-CM

## 2015-10-28 LAB — BASIC METABOLIC PANEL
BUN: 24 mg/dL (ref 7–25)
CALCIUM: 10.2 mg/dL (ref 8.6–10.4)
CO2: 38 mmol/L — ABNORMAL HIGH (ref 20–31)
Chloride: 99 mmol/L (ref 98–110)
Creat: 1.34 mg/dL — ABNORMAL HIGH (ref 0.60–0.88)
Glucose, Bld: 88 mg/dL (ref 65–99)
Potassium: 5.2 mmol/L (ref 3.5–5.3)
SODIUM: 146 mmol/L (ref 135–146)

## 2015-10-28 LAB — CBC WITH DIFFERENTIAL/PLATELET
Basophils Absolute: 71 cells/uL (ref 0–200)
Basophils Relative: 1 %
EOS PCT: 5 %
Eosinophils Absolute: 355 cells/uL (ref 15–500)
HCT: 32.7 % — ABNORMAL LOW (ref 35.0–45.0)
HEMOGLOBIN: 10.1 g/dL — AB (ref 11.7–15.5)
LYMPHS ABS: 1278 {cells}/uL (ref 850–3900)
Lymphocytes Relative: 18 %
MCH: 27.9 pg (ref 27.0–33.0)
MCHC: 30.9 g/dL — AB (ref 32.0–36.0)
MCV: 90.3 fL (ref 80.0–100.0)
MPV: 9.7 fL (ref 7.5–12.5)
Monocytes Absolute: 568 cells/uL (ref 200–950)
Monocytes Relative: 8 %
NEUTROS ABS: 4828 {cells}/uL (ref 1500–7800)
NEUTROS PCT: 68 %
Platelets: 250 10*3/uL (ref 140–400)
RBC: 3.62 MIL/uL — AB (ref 3.80–5.10)
RDW: 13.4 % (ref 11.0–15.0)
WBC: 7.1 10*3/uL (ref 3.8–10.8)

## 2015-10-28 NOTE — Patient Instructions (Addendum)
Medication Instructions:  Your physician has recommended you make the following change in your medication:  STOP Aspirin  Labwork: Bmet, Cbc today  Testing/Procedures: None ordered  Follow-Up: You have been referred to Gastroenterology  Your physician wants you to follow-up in: 6 months with Dr.Varanasi You will receive a reminder letter in the mail two months in advance. If you don't receive a letter, please call our office to schedule the follow-up appointment.   Any Other Special Instructions Will Be Listed Below (If Applicable).     If you need a refill on your cardiac medications before your next appointment, please call your pharmacy.

## 2015-10-31 ENCOUNTER — Encounter: Payer: Self-pay | Admitting: Internal Medicine

## 2015-11-29 ENCOUNTER — Ambulatory Visit (INDEPENDENT_AMBULATORY_CARE_PROVIDER_SITE_OTHER): Payer: Medicare Other | Admitting: Emergency Medicine

## 2015-11-29 ENCOUNTER — Encounter: Payer: Self-pay | Admitting: Emergency Medicine

## 2015-11-29 DIAGNOSIS — Z23 Encounter for immunization: Secondary | ICD-10-CM

## 2015-11-29 DIAGNOSIS — J449 Chronic obstructive pulmonary disease, unspecified: Secondary | ICD-10-CM | POA: Diagnosis not present

## 2015-11-29 NOTE — Patient Instructions (Signed)
We will give you flu shot today We will continue Spiriva 2 puffs once a day Use xopenex + ipratropium nebulizers up to every 6 hours if needed for shortness of breath.  Follow with Dr Lamonte Sakai in 6 months or sooner if you have any problems

## 2015-11-29 NOTE — Assessment & Plan Note (Signed)
We will give you flu shot today We will continue Spiriva 2 puffs once a day Use xopenex + ipratropium nebulizers up to every 6 hours if needed for shortness of breath.  Follow with Dr Lamonte Sakai in 6 months or sooner if you have any problems

## 2015-11-29 NOTE — Progress Notes (Signed)
Subjective:    Patient ID: Paula Contreras, female    DOB: 31-Mar-1934, 82 y.o.   MRN: [id number] HPI 82 yo longstanding smoker (~100 pk-yrs), never dx w COPD before but was admitted to [facility] 6/12. Found to be hypoxic, started on O2 and on combivent bid + pulmicort. Minimal exertional tolerance. Had been dealing w fatigue, malaise - may be a bit better since the hospitalization.      ROV 12/01/14 -- follow-up visit for chronic hypoxemic respiratory failure, chronic dyspnea, COPD. She also has superimposed atrial fibrillation, hypertension and moderate aortic stenosis that contribute to her overall dyspnea. At her last visit we increased her supple oxygen to 3 L/m when she gets her pulse system. We also added Spiriva respimat to see if she would benefit. In particular I was hopeful that her consistency with the medication would be improved now that she is in assisted living. She believes that she is breathing a bit better. She admits that she is not very active, doesn't walk much. She isn't sure that she is taking the medication correctly, not really inhaling it, just holding it in her mouth. No real cough or wheeze. No exacerbations       ROV 05/30/15 -- follow-up visit for COPD. She has chronic hypoxemic respiratory failure in the setting of this as well as atrial fibrillation, hypertension, aortic stenosis. She was seen by Dr Vaughan Browner in 9/16 for an acute exacerbation of her COPD, was treated with prednisone taper.  She was hospitalized 11/'16. She is on spiriva daily, wears her o2 24x7. She gets SOB when she changes clothes, she uses SABA (ipratropium + xopenex) about once a day.                  ROV 11/29/15 -- Patient is an 82 year old woman with a history of chronic hypoxemic respiratory failure in the setting of severe COPD, fibrillation, hypertension and aortic stenosis. She returns today for regular follow-up visit.   She is staying at [facility]. Still has exertional SOB with simple tasks, ADL's. She is  very compliant with her O2.  She has no wheeze, minimal cough. No acute flares since last time.                                                        Objective:   Physical Exam Vitals:   11/29/15 1012  BP: 118/78  Pulse: 61  SpO2: 92%  Weight: 156 lb (70.8 kg)    Gen: Pleasant, thin, in no distress,  normal affect, mild kyphosis  ENT: No lesions,  mouth clear,  oropharynx clear, no postnasal drip,  Neck: No JVD, no TMG, no carotid bruits  Lungs: distant no wheezing  Cardiovascular: RRR, heart sounds normal, no murmur or gallops, no peripheral edema  Musculoskeletal: No deformities, no cyanosis or clubbing  Neuro: alert, non focal  Skin: Warm, no lesions or rashes   Assessment & Plan:  COPD (chronic obstructive pulmonary disease) (HCC) We will give you flu shot today We will continue Spiriva 2 puffs once a day Use xopenex + ipratropium nebulizers up to every 6 hours if needed for shortness of breath.  Follow with Dr Lamonte Sakai in 6 months or sooner if you have any problems   Baltazar Apo, MD, PhD 11/29/2015, 10:39 AM [facility] [phone] or  if no answer [phone]

## 2016-01-04 ENCOUNTER — Ambulatory Visit (INDEPENDENT_AMBULATORY_CARE_PROVIDER_SITE_OTHER): Payer: Medicare Other | Admitting: Internal Medicine

## 2016-01-04 ENCOUNTER — Encounter (INDEPENDENT_AMBULATORY_CARE_PROVIDER_SITE_OTHER): Payer: Self-pay

## 2016-01-04 ENCOUNTER — Encounter: Payer: Self-pay | Admitting: Internal Medicine

## 2016-01-04 VITALS — BP 144/80 | HR 68 | Ht 65.0 in | Wt 159.1 lb

## 2016-01-04 DIAGNOSIS — K625 Hemorrhage of anus and rectum: Secondary | ICD-10-CM | POA: Diagnosis not present

## 2016-01-04 NOTE — Patient Instructions (Signed)
Please follow up as needed 

## 2016-01-04 NOTE — Progress Notes (Signed)
HISTORY OF PRESENT ILLNESS:  Paula Contreras is a 82 y.o. female with multiple advanced medical problems who was apparently referred here by [facility] (? Dr. Alyson Ingles) for rectal bleeding. The appointment was made by the patient's power of attorney, Paula Contreras, who accompanies her today. We are set no records. Paula Contreras is pleasant but provides little information. The patient is quite hard of hearing, and quite frankly cantankerous. I did spend considerable amount of time reviewing the electronic medical record. She has end-stage lung disease for which she is on chronic oxygen therapy. She has significant cardiac issues including aortic stenosis. Advanced renal disease and a history of atrial fibrillation. Last hemoglobin available in the EMR is from 10/28/2015. 10.1. Normal MCV. The patient states that she has seen minor rectal bleeding on several occasions when she strained with a hard stool. Last time was proximally month ago. Nothing recently. She is in a wheelchair with oxygen. She will not consent to physical examination stating that "I have onto many close". She is on multiple medications as listed. Apparently has some issues with constipation for which she has Senokot. Not clear to me that she has had previous GI evaluations.  REVIEW OF SYSTEMS:  All non-GI ROS negative except for hearing impairment, arthritis  Past Medical History:  Diagnosis Date  . Anemia 02/20/2015  . Aortic stenosis    a. 2D Echo 07/2013: EF 60-65%, no RWMA, grade 1 DD, normal LV filling pressure, moderate AS, mild TR, PASP 74mHg.  . Bradycardia    a. possible bradycardia with HR 44 by physical exam notes at nephrologist office in 03/2014. F/u event monitor showed NSR PACs average HR 81, bradycardia <1% of readable data, no pauses of 3 seconds or longer..  . Cataract   . Chronic respiratory failure ([facility]    a. On home O2 since 2012.  . CKD (chronic kidney disease), stage III   . COPD (chronic  obstructive pulmonary disease) ([facility]   . Dementia   . Hypertension   . Osteoporosis   . Other psoriasis and similar disorders    psoriasis vs eczema  . Paroxysmal atrial fibrillation ([facility]    a. Dx 07/2013. No attempts per Epic to restore NSR but in NSR 2016.  . Skin cancer     Past Surgical History:  Procedure Laterality Date  . BREAST SURGERY Right 1964   tumor removed  . CATARACT EXTRACTION Bilateral   . HIP SURGERY Right    fracture    Social History Paula Contreras reports that she quit smoking about 5 years ago. Her smoking use included Cigarettes. She has a 55.00 pack-year smoking history. She has never used smokeless tobacco. She reports that she does not drink alcohol or use drugs.  family history includes Asthma in her maternal grandmother and mother; Diabetes in her brother; Kidney disease in her mother; Lung cancer in her father and paternal uncle.  Allergies  Allergen Reactions  . Codeine Nausea And Vomiting       PHYSICAL EXAMINATION: Vital signs: BP (!) 144/80 (BP Location: Left Arm, Patient Position: Sitting, Cuff Size: Normal)   Pulse 68   Ht '5\' 5"'$  (1.651 m)   Wt 159 lb 2 oz (72.2 kg)   BMI 26.48 kg/m   Constitutional: Frail, elderly, chronically ill-appearing, in wheelchair, no acute distress Psychiatric: alert and oriented x3, cooperative but cantankerous Eyes: extraocular movements intact, anicteric, conjunctiva pink Mouth: Refused Neck: Refused Cardiovascular: Refused Lungs: Refused Abdomen: Refused Rectal: REFUSED Extremities: Refused  Skin: no obvious lesions on visible surface Neuro: Clearly hard of hearing.  ASSESSMENT:  #1. Minor rectal bleeding with passage of hard stool. Nothing recently. Suspect fissure or hemorrhoid. Cannot assess further as patient would not consent to examination. Cannot rule out neoplasia. This is somewhat mood as the Patient is not appropriate for endoscopic evaluations given her overwhelmingly high risk status  from both lungs and heart disease. Furthermore, she is not interested in any further evaluation despite showing up for today's appointment. This is not unreasonable given her comorbidities  PLAN:  #1. Return to your assisted living facility. If she were to have recurrent minor rectal bleeding, I would recommend nothing more than empiric medicated suppositories. If she were to have clinically significant GI bleeding, then she should be referred to the emergency room for assessment. I discussed this with the patient and her power of attorney.

## 2016-02-16 ENCOUNTER — Emergency Department (HOSPITAL_COMMUNITY)
Admission: EM | Admit: 2016-02-16 | Discharge: 2016-02-17 | Disposition: A | Discharge status: Home / Self Care | Payer: Medicare Other | Attending: Emergency Medicine | Admitting: Emergency Medicine

## 2016-02-16 ENCOUNTER — Emergency Department (HOSPITAL_COMMUNITY): Payer: Medicare Other

## 2016-02-16 DIAGNOSIS — Z85828 Personal history of other malignant neoplasm of skin: Secondary | ICD-10-CM | POA: Diagnosis not present

## 2016-02-16 DIAGNOSIS — R042 Hemoptysis: Secondary | ICD-10-CM | POA: Diagnosis present

## 2016-02-16 DIAGNOSIS — N183 Chronic kidney disease, stage 3 (moderate): Secondary | ICD-10-CM | POA: Insufficient documentation

## 2016-02-16 DIAGNOSIS — Z79899 Other long term (current) drug therapy: Secondary | ICD-10-CM | POA: Diagnosis not present

## 2016-02-16 DIAGNOSIS — J449 Chronic obstructive pulmonary disease, unspecified: Secondary | ICD-10-CM | POA: Diagnosis not present

## 2016-02-16 DIAGNOSIS — I129 Hypertensive chronic kidney disease with stage 1 through stage 4 chronic kidney disease, or unspecified chronic kidney disease: Secondary | ICD-10-CM | POA: Diagnosis not present

## 2016-02-16 DIAGNOSIS — Z7901 Long term (current) use of anticoagulants: Secondary | ICD-10-CM | POA: Insufficient documentation

## 2016-02-16 DIAGNOSIS — R062 Wheezing: Secondary | ICD-10-CM | POA: Diagnosis not present

## 2016-02-16 LAB — COMPREHENSIVE METABOLIC PANEL
ALK PHOS: 64 U/L (ref 38–126)
ALT: 13 U/L — AB (ref 14–54)
ANION GAP: 6 (ref 5–15)
AST: 22 U/L (ref 15–41)
Albumin: 3.5 g/dL (ref 3.5–5.0)
BUN: 23 mg/dL — ABNORMAL HIGH (ref 6–20)
CALCIUM: 9.7 mg/dL (ref 8.9–10.3)
CO2: 37 mmol/L — AB (ref 22–32)
CREATININE: 1.3 mg/dL — AB (ref 0.44–1.00)
Chloride: 97 mmol/L — ABNORMAL LOW (ref 101–111)
GFR, EST AFRICAN AMERICAN: 44 mL/min — AB (ref >60)
GFR, EST NON AFRICAN AMERICAN: 38 mL/min — AB (ref >60)
Glucose, Bld: 128 mg/dL — ABNORMAL HIGH (ref 65–99)
Potassium: 4.4 mmol/L (ref 3.5–5.1)
SODIUM: 140 mmol/L (ref 135–145)
Total Bilirubin: 0.3 mg/dL (ref 0.3–1.2)
Total Protein: 6.9 g/dL (ref 6.5–8.1)

## 2016-02-16 LAB — CBC
HEMATOCRIT: 31.7 % — AB (ref 36.0–46.0)
Hemoglobin: 9.1 g/dL — ABNORMAL LOW (ref 12.0–15.0)
MCH: 26.4 pg (ref 26.0–34.0)
MCHC: 28.7 g/dL — ABNORMAL LOW (ref 30.0–36.0)
MCV: 91.9 fL (ref 78.0–100.0)
Platelets: 262 10*3/uL (ref 150–400)
RBC: 3.45 MIL/uL — AB (ref 3.87–5.11)
RDW: 14 % (ref 11.5–15.5)
WBC: 8.7 10*3/uL (ref 4.0–10.5)

## 2016-02-16 LAB — BRAIN NATRIURETIC PEPTIDE: B Natriuretic Peptide: 109.8 pg/mL — ABNORMAL HIGH (ref 0.0–100.0)

## 2016-02-16 LAB — ABO/RH: ABO/RH(D): A POS

## 2016-02-16 LAB — I-STAT TROPONIN, ED: Troponin i, poc: 0.01 ng/mL (ref 0.00–0.08)

## 2016-02-16 NOTE — ED Provider Notes (Signed)
I saw and evaluated the patient, reviewed the resident's note and I agree with the findings and plan.   EKG Interpretation  Date/Time:  Thursday February 16 2016 18:51:59 EST Ventricular Rate:  89 PR Interval:    QRS Duration: 95 QT Interval:  347 QTC Calculation: 423 R Axis:   26 Text Interpretation:  Sinus rhythm Multiple ventricular premature complexes Confirmed by Zenia Resides  MD, Cutberto Winfree ([id number]) on 02/16/2016 8:10:01 PM     Patient here after having hemoptysis at the nursing home.patient does takes Xarelto. Denies being short of breath. Will check chest x-ray and labs. Likely discharge home   Lacretia Leigh, MD 02/16/16 2056

## 2016-02-16 NOTE — ED Provider Notes (Signed)
[place] DEPT Provider Note   CSN: [id number] Arrival date & time: 02/16/16  [place]     History   Chief Complaint Chief Complaint  Patient presents with  . Hemoptysis  . Chest Pain    HPI Paula Contreras is a 82 y.o. female with a hx of COPD, on O2 by [place] at all times, and hx paroxysmal a. Fib on xarelto presents to the ED stating that this after noon she began coughing up blood. She states that she has a chronic cough intermittently which she states is unchanged from her baseline. During this she has intermittent sputum production. Today she had some bloody streaked sputum. No massive hemoptysis or hematemesis. She denies any fever, chills, nausea, vomiting, diarrhea, chest pain, abdominal pain, sore throat. She states that her [employer] oxygen maker her nasopharynx and posterior oropharynx chronically dry feeling. She was found to have this hemoptysis at her nursing home by her care givers who called EMS and had her transported directly to [facility] ED for further evaluation. On arrival the patient is GCS 15, in NAD, speaking in full sentences on her home oxygen.   HPI  Past Medical History:  Diagnosis Date  . Anemia 02/20/2015  . Aortic stenosis    a. 2D Echo 07/2013: EF 60-65%, no RWMA, grade 1 DD, normal LV filling pressure, moderate AS, mild TR, PASP 54mHg.  . Bradycardia    a. possible bradycardia with HR 44 by physical exam notes at nephrologist office in 03/2014. F/u event monitor showed NSR PACs average HR 81, bradycardia <1% of readable data, no pauses of 3 seconds or longer..  . Cataract   . Chronic respiratory failure ([facility]    a. On home O2 since 2012.  . CKD (chronic kidney disease), stage III   . COPD (chronic obstructive pulmonary disease) ([facility]   . Dementia   . Hypertension   . Osteoporosis   . Other psoriasis and similar disorders    psoriasis vs eczema  . Paroxysmal atrial fibrillation ([facility]    a. Dx 07/2013. No attempts per Epic to restore NSR but in NSR 2016.  . Skin  cancer     Patient Active Problem List   Diagnosis Date Noted  . History of bradycardia 10/27/2015  . SOB (shortness of breath)   . Encounter for palliative care   . Hypernatremia 02/21/2015  . Altered mental status 02/20/2015  . Metabolic encephalopathy 167/89/3810 . Weakness generalized 02/20/2015  . Dehydration 02/20/2015  . Leukocytosis 02/20/2015  . Anemia 02/20/2015  . Bronchitis 02/20/2015  . Fall at nursing home 02/20/2015  . Bradycardia 04/09/2014  . Chronic respiratory failure ([facility]   . Aortic stenosis   . PAF (paroxysmal atrial fibrillation) ([facility]   . CKD (chronic kidney disease), stage III   . Aortic valve disorder 10/30/2013  . Undiagnosed cardiac murmurs 07/21/2013  . Hypertension 10/02/2010  . COPD (chronic obstructive pulmonary disease) ([facility] 10/02/2010    Past Surgical History:  Procedure Laterality Date  . BREAST SURGERY Right 1964   tumor removed  . CATARACT EXTRACTION Bilateral   . HIP SURGERY Right    fracture    OB History    No data available       Home Medications    Prior to Admission medications   Medication Sig Start Date End Date Taking? Authorizing Provider  acetaminophen (TYLENOL) 325 MG tablet Take 650 mg by mouth every 6 (six) hours as needed (for pain).    Yes Historical Provider, MD  albuterol (  PROAIR HFA) 108 (90 BASE) MCG/ACT inhaler Inhale 2 puffs into the lungs every 4 (four) hours as needed for wheezing or shortness of breath. 12/24/14  Yes Collene Gobble, MD  alendronate (FOSAMAX) 70 MG tablet Take 70 mg by mouth every Monday. Take with a full glass of water on an empty stomach.   Yes Historical Provider, MD  calcium carbonate (OS-CAL) 600 MG TABS tablet Take 2 tablets by mouth daily with breakfast.    Yes Historical Provider, MD  Cholecalciferol (VITAMIN D) 2000 UNITS CAPS Take 2,000 Units by mouth daily with breakfast.    Yes Historical Provider, MD  clobetasol (TEMOVATE) 0.05 % external solution apply 4 drops to scalp one  daily Monday- Thursday for psoriasis   Yes Historical Provider, MD  esomeprazole (NEXIUM) 20 MG capsule Take 20 mg by mouth daily at 12 noon.   Yes Historical Provider, MD  hydrocortisone 2.5 % cream Apply 1 application topically See admin instructions. Apply to Affected area topically to ears twice a day on Monday Tuesday Wednesday and Thursday   Yes Historical Provider, MD  ipratropium (ATROVENT) 0.02 % nebulizer solution Take 2.5 mLs (0.5 mg total) by nebulization every 2 (two) hours as needed for wheezing or shortness of breath. 02/23/15  Yes Theodis Blaze, MD  ketoconazole (NIZORAL) 2 % shampoo Apply 1 application topically as directed.   Yes Historical Provider, MD  levalbuterol (XOPENEX) 0.63 MG/3ML nebulizer solution Take 3 mLs (0.63 mg total) by nebulization every 2 (two) hours as needed for wheezing or shortness of breath. 02/23/15  Yes Theodis Blaze, MD  Multiple Vitamins-Iron (MULTIVITAMIN/IRON PO) Take 1 tablet by mouth daily.   Yes Historical Provider, MD  OXYGEN Inhale 2-3 L into the lungs continuous. Change to 3L when on portable   Yes Historical Provider, MD  rivaroxaban (XARELTO) 20 MG TABS tablet Take 20 mg by mouth daily with supper.   Yes Historical Provider, MD  senna-docusate (SENOKOT-S) 8.6-50 MG tablet Take 1 tablet by mouth at bedtime as needed for mild constipation. 02/23/15  Yes Theodis Blaze, MD  Tiotropium Bromide Monohydrate (SPIRIVA RESPIMAT) 2.5 MCG/ACT AERS Inhale 2 puffs into the lungs daily.   Yes Historical Provider, MD    Family History Family History  Problem Relation Age of Onset  . Asthma Mother   . Kidney disease Mother   . Lung cancer Father   . Asthma Maternal Grandmother   . Lung cancer Paternal Uncle     father's twin brother  . Diabetes Brother     Social History Social History  Substance Use Topics  . Smoking status: Former Smoker    Packs/day: 1.00    Years: 55.00    Types: Cigarettes    Quit date: 09/25/2010  . Smokeless tobacco: Never  Used  . Alcohol use No     Allergies   Codeine   Review of Systems Review of Systems  Constitutional: Negative for activity change, appetite change, chills and fever.  HENT: Negative for congestion, mouth sores, nosebleeds, postnasal drip, rhinorrhea, sinus pain, sinus pressure, sneezing, sore throat and trouble swallowing.   Respiratory: Positive for cough. Negative for choking, chest tightness, shortness of breath and wheezing.   Cardiovascular: Negative for chest pain and leg swelling.  Gastrointestinal: Negative for abdominal distention, blood in stool, diarrhea, nausea and vomiting.  Genitourinary: Negative for difficulty urinating and dysuria.  Musculoskeletal: Negative for back pain and neck pain.  Skin: Negative for rash and wound.  Neurological: Negative for syncope, weakness,  numbness and headaches.  All other systems reviewed and are negative.    Physical Exam Updated Vital Signs BP 136/84 (BP Location: Right Arm)   Pulse 91   Resp 16   SpO2 99%   Physical Exam  Constitutional: She is oriented to person, place, and time. She appears well-developed and well-nourished. No distress.  HENT:  Head: Normocephalic and atraumatic.  Nose: Nose normal.  Mouth/Throat: Oropharynx is clear and moist.  [place] O2 in place. Oropharynx clear.  Eyes: Conjunctivae and EOM are normal. Pupils are equal, round, and reactive to light.  Neck: Neck supple.  Cardiovascular: Normal rate, normal heart sounds and intact distal pulses.  An irregularly irregular rhythm present.  Pulmonary/Chest: Effort normal. No respiratory distress. She has wheezes (scattered, faint). She has rales (bibasilar). She exhibits no tenderness.  Abdominal: Soft. She exhibits no distension. There is no tenderness.  Musculoskeletal: She exhibits no edema or tenderness.  Neurological: She is alert and oriented to person, place, and time. No cranial nerve deficit or sensory deficit. Coordination normal.  Skin: Skin is  warm and dry. No rash noted. She is not diaphoretic.  Nursing note and vitals reviewed.    ED Treatments / Results  Labs (all labs ordered are listed, but only abnormal results are displayed) Labs Reviewed  CBC - Abnormal; Notable for the following:       Result Value   RBC 3.45 (*)    Hemoglobin 9.1 (*)    HCT 31.7 (*)    MCHC 28.7 (*)    All other components within normal limits  COMPREHENSIVE METABOLIC PANEL - Abnormal; Notable for the following:    Chloride 97 (*)    CO2 37 (*)    Glucose, Bld 128 (*)    BUN 23 (*)    Creatinine, Ser 1.30 (*)    ALT 13 (*)    GFR calc non Af Amer 38 (*)    GFR calc Af Amer 44 (*)    All other components within normal limits  BRAIN NATRIURETIC PEPTIDE - Abnormal; Notable for the following:    B Natriuretic Peptide 109.8 (*)    All other components within normal limits  I-STAT TROPOININ, ED  TYPE AND SCREEN  ABO/RH    EKG  EKG Interpretation  Date/Time:  Thursday February 16 2016 18:51:59 EST Ventricular Rate:  89 PR Interval:    QRS Duration: 95 QT Interval:  347 QTC Calculation: 423 R Axis:   26 Text Interpretation:  Sinus rhythm Multiple ventricular premature complexes Confirmed by Zenia Resides  MD, ANTHONY ([id number]) on 02/16/2016 8:10:01 PM Also confirmed by Zenia Resides  MD, ANTHONY ([id number]), editor [place]  CCT, BEVERLY ([id number])  on 02/17/2016 9:04:31 AM       Radiology Dg Chest Portable 1 View  Result Date: 02/16/2016 CLINICAL DATA:  Left-sided chest pain, bloody sputum with cough. EXAM: PORTABLE CHEST 1 VIEW COMPARISON:  Exams dating back through 12/27/2014 FINDINGS: There is mild diffuse interstitial edema with small left effusion. Atelectatic appearing left lung with volume loss and mediastinal as well as cardiac shift to left with compensatory hyperinflation of the right lung. A double density over the cardiac silhouette cannot exclude a hiatal hernia. Heart is normal in size. There is aortic atherosclerosis. IMPRESSION: Volume loss  with shift of the mediastinum and heart to the left. Interstitial pulmonary edema with probable small left pleural effusion. A double density over the cardiac silhouette raises the possibility of a hiatal hernia although atelectatic lung might also contribute to this  appearance. Electronically Signed   By: Ashley Royalty M.D.   On: 02/16/2016 19:38    Procedures Procedures (including critical care time)  Medications Ordered in ED Medications - No data to display   Initial Impression / Assessment and Plan / ED Course  I have reviewed the triage vital signs and the nursing notes.  Pertinent labs & imaging results that were available during my care of the patient were reviewed by me and considered in my medical decision making (see chart for details).  Clinical Course    82 y.o. female presents to the ED noting intermittent hemoptysis with her chronic cough, but no sx concerning for acute infection or COPD exacerbation. Labs were drawn and showed Hg 9.1, Cr 1.30, similar to prior. BNP 109.8, trop neg, CXR shows some pulm edema, but no evidence of PNA. Feel that her small volume bleeding is due to a mixture of dessicated mucosal tissues from oxygen, and weather changes, chronic cough, and taking xarelto. Feel that she likely caused a small excoriation along her upper airway and precipitated some hemoptysis. Given stable vitals, history, and reassuring labs do not feel that this is massive hemoptysis. Low suspicion for PE given xarelto dosing. She was recommended to use humidified oxygen and saline nasal spray and to follow up with her PCP in the next fe days for further evaluation of her. Symptoms. Return precautions were given for worsening or concerns. This plan was discussed with the patient at the bedside and she stated both understanding and agreement with this plan   Final Clinical Impressions(s) / ED Diagnoses   Final diagnoses:  Cough with hemoptysis    New Prescriptions Discharge  Medication List as of 02/16/2016  9:16 PM       Zenovia Jarred, DO 02/17/16 1928

## 2016-02-16 NOTE — ED Triage Notes (Signed)
Per EMS - from [facility]. Pt has had intermittent episodes blood in sputum today. Hx cough d/t COPD. C/o left-sided lateral CP, worse with palpation and deep breathing. 12-lead ST w/ occ PVCs. Pt takes xarelto. Denies recent sickness or fever.

## 2016-02-16 NOTE — ED Notes (Signed)
Nurse updated on pt.'s pending laboratory results , repositioned for comfort , delay and process explained . Denies pain / respirations unlabored .

## 2016-02-16 NOTE — ED Notes (Signed)
Pt called out again stating that "I've been in here for 30-45 mins and no one has been here to tell me what's going on. If it's normal to be coughing up blood since I'm on a blood thinner then I want to go home."

## 2016-02-16 NOTE — ED Notes (Signed)
Pt SpO2 76% on 3L [place]. Placed on 5L [place], SpO2 89-90%

## 2016-02-16 NOTE — ED Notes (Signed)
EKG completed and given to Dr. Zenia Resides.

## 2016-02-16 NOTE — ED Notes (Signed)
Pt requesting to be updated.

## 2016-02-17 LAB — TYPE AND SCREEN
ABO/RH(D): A POS
ANTIBODY SCREEN: NEGATIVE

## 2016-02-24 ENCOUNTER — Inpatient Hospital Stay (HOSPITAL_COMMUNITY): Payer: Medicare Other

## 2016-02-24 ENCOUNTER — Inpatient Hospital Stay (HOSPITAL_COMMUNITY)
Admission: EM | Admit: 2016-02-24 | Discharge: 2016-03-02 | DRG: 191 | Disposition: A | Discharge status: Skilled Nursing Facility | Payer: Medicare Other | Attending: Internal Medicine | Admitting: Internal Medicine

## 2016-02-24 ENCOUNTER — Emergency Department (HOSPITAL_COMMUNITY): Payer: Medicare Other

## 2016-02-24 ENCOUNTER — Encounter (HOSPITAL_COMMUNITY): Payer: Self-pay | Admitting: *Deleted

## 2016-02-24 DIAGNOSIS — N183 Chronic kidney disease, stage 3 unspecified: Secondary | ICD-10-CM | POA: Diagnosis present

## 2016-02-24 DIAGNOSIS — H9190 Unspecified hearing loss, unspecified ear: Secondary | ICD-10-CM | POA: Diagnosis present

## 2016-02-24 DIAGNOSIS — Z87891 Personal history of nicotine dependence: Secondary | ICD-10-CM

## 2016-02-24 DIAGNOSIS — L409 Psoriasis, unspecified: Secondary | ICD-10-CM | POA: Diagnosis present

## 2016-02-24 DIAGNOSIS — I350 Nonrheumatic aortic (valve) stenosis: Secondary | ICD-10-CM | POA: Diagnosis present

## 2016-02-24 DIAGNOSIS — K830 Cholangitis: Secondary | ICD-10-CM | POA: Diagnosis not present

## 2016-02-24 DIAGNOSIS — Z85828 Personal history of other malignant neoplasm of skin: Secondary | ICD-10-CM

## 2016-02-24 DIAGNOSIS — D649 Anemia, unspecified: Secondary | ICD-10-CM | POA: Diagnosis present

## 2016-02-24 DIAGNOSIS — J440 Chronic obstructive pulmonary disease with acute lower respiratory infection: Principal | ICD-10-CM | POA: Diagnosis present

## 2016-02-24 DIAGNOSIS — N179 Acute kidney failure, unspecified: Secondary | ICD-10-CM | POA: Diagnosis present

## 2016-02-24 DIAGNOSIS — R945 Abnormal results of liver function studies: Secondary | ICD-10-CM

## 2016-02-24 DIAGNOSIS — Z9842 Cataract extraction status, left eye: Secondary | ICD-10-CM | POA: Diagnosis not present

## 2016-02-24 DIAGNOSIS — Z841 Family history of disorders of kidney and ureter: Secondary | ICD-10-CM | POA: Diagnosis not present

## 2016-02-24 DIAGNOSIS — K8030 Calculus of bile duct with cholangitis, unspecified, without obstruction: Secondary | ICD-10-CM | POA: Diagnosis present

## 2016-02-24 DIAGNOSIS — I482 Chronic atrial fibrillation: Secondary | ICD-10-CM | POA: Diagnosis present

## 2016-02-24 DIAGNOSIS — Z801 Family history of malignant neoplasm of trachea, bronchus and lung: Secondary | ICD-10-CM

## 2016-02-24 DIAGNOSIS — R7989 Other specified abnormal findings of blood chemistry: Secondary | ICD-10-CM | POA: Diagnosis not present

## 2016-02-24 DIAGNOSIS — I129 Hypertensive chronic kidney disease with stage 1 through stage 4 chronic kidney disease, or unspecified chronic kidney disease: Secondary | ICD-10-CM | POA: Diagnosis present

## 2016-02-24 DIAGNOSIS — E876 Hypokalemia: Secondary | ICD-10-CM | POA: Diagnosis not present

## 2016-02-24 DIAGNOSIS — I359 Nonrheumatic aortic valve disorder, unspecified: Secondary | ICD-10-CM | POA: Diagnosis not present

## 2016-02-24 DIAGNOSIS — K8031 Calculus of bile duct with cholangitis, unspecified, with obstruction: Secondary | ICD-10-CM | POA: Diagnosis not present

## 2016-02-24 DIAGNOSIS — I10 Essential (primary) hypertension: Secondary | ICD-10-CM | POA: Diagnosis present

## 2016-02-24 DIAGNOSIS — Z833 Family history of diabetes mellitus: Secondary | ICD-10-CM

## 2016-02-24 DIAGNOSIS — J9611 Chronic respiratory failure with hypoxia: Secondary | ICD-10-CM | POA: Diagnosis present

## 2016-02-24 DIAGNOSIS — I700 Atherosclerosis of aorta: Secondary | ICD-10-CM | POA: Diagnosis present

## 2016-02-24 DIAGNOSIS — M810 Age-related osteoporosis without current pathological fracture: Secondary | ICD-10-CM | POA: Diagnosis present

## 2016-02-24 DIAGNOSIS — J449 Chronic obstructive pulmonary disease, unspecified: Secondary | ICD-10-CM

## 2016-02-24 DIAGNOSIS — R1013 Epigastric pain: Secondary | ICD-10-CM | POA: Diagnosis present

## 2016-02-24 DIAGNOSIS — Z825 Family history of asthma and other chronic lower respiratory diseases: Secondary | ICD-10-CM | POA: Diagnosis not present

## 2016-02-24 DIAGNOSIS — R112 Nausea with vomiting, unspecified: Secondary | ICD-10-CM

## 2016-02-24 DIAGNOSIS — I480 Paroxysmal atrial fibrillation: Secondary | ICD-10-CM | POA: Diagnosis present

## 2016-02-24 DIAGNOSIS — Z9841 Cataract extraction status, right eye: Secondary | ICD-10-CM | POA: Diagnosis not present

## 2016-02-24 DIAGNOSIS — J189 Pneumonia, unspecified organism: Secondary | ICD-10-CM | POA: Diagnosis not present

## 2016-02-24 DIAGNOSIS — K8050 Calculus of bile duct without cholangitis or cholecystitis without obstruction: Secondary | ICD-10-CM | POA: Diagnosis not present

## 2016-02-24 DIAGNOSIS — Z9981 Dependence on supplemental oxygen: Secondary | ICD-10-CM

## 2016-02-24 DIAGNOSIS — K449 Diaphragmatic hernia without obstruction or gangrene: Secondary | ICD-10-CM | POA: Diagnosis present

## 2016-02-24 DIAGNOSIS — K8033 Calculus of bile duct with acute cholangitis with obstruction: Secondary | ICD-10-CM | POA: Diagnosis not present

## 2016-02-24 DIAGNOSIS — R1310 Dysphagia, unspecified: Secondary | ICD-10-CM | POA: Diagnosis present

## 2016-02-24 DIAGNOSIS — K8309 Other cholangitis: Secondary | ICD-10-CM

## 2016-02-24 DIAGNOSIS — J9610 Chronic respiratory failure, unspecified whether with hypoxia or hypercapnia: Secondary | ICD-10-CM | POA: Diagnosis present

## 2016-02-24 DIAGNOSIS — Z66 Do not resuscitate: Secondary | ICD-10-CM | POA: Diagnosis present

## 2016-02-24 DIAGNOSIS — Z7901 Long term (current) use of anticoagulants: Secondary | ICD-10-CM

## 2016-02-24 LAB — URINALYSIS, ROUTINE W REFLEX MICROSCOPIC
GLUCOSE, UA: NEGATIVE mg/dL
Hgb urine dipstick: NEGATIVE
Ketones, ur: NEGATIVE mg/dL
LEUKOCYTES UA: NEGATIVE
NITRITE: NEGATIVE
PH: 7.5 (ref 5.0–8.0)
Protein, ur: 30 mg/dL — AB
SPECIFIC GRAVITY, URINE: 1.017 (ref 1.005–1.030)

## 2016-02-24 LAB — HEPATIC FUNCTION PANEL
ALK PHOS: 141 U/L — AB (ref 38–126)
ALT: 390 U/L — ABNORMAL HIGH (ref 14–54)
AST: 601 U/L — AB (ref 15–41)
Albumin: 3.5 g/dL (ref 3.5–5.0)
BILIRUBIN DIRECT: 1.6 mg/dL — AB (ref 0.1–0.5)
BILIRUBIN TOTAL: 2.6 mg/dL — AB (ref 0.3–1.2)
Indirect Bilirubin: 1 mg/dL — ABNORMAL HIGH (ref 0.3–0.9)
Total Protein: 6.8 g/dL (ref 6.5–8.1)

## 2016-02-24 LAB — I-STAT CG4 LACTIC ACID, ED
LACTIC ACID, VENOUS: 1.12 mmol/L (ref 0.5–1.9)
Lactic Acid, Venous: 0.72 mmol/L (ref 0.5–1.9)

## 2016-02-24 LAB — CBC WITH DIFFERENTIAL/PLATELET
BASOS ABS: 0 10*3/uL (ref 0.0–0.1)
Basophils Relative: 0 %
Eosinophils Absolute: 0 10*3/uL (ref 0.0–0.7)
Eosinophils Relative: 0 %
HEMATOCRIT: 31.2 % — AB (ref 36.0–46.0)
HEMOGLOBIN: 9.1 g/dL — AB (ref 12.0–15.0)
LYMPHS ABS: 0.3 10*3/uL — AB (ref 0.7–4.0)
LYMPHS PCT: 2 %
MCH: 26.5 pg (ref 26.0–34.0)
MCHC: 29.2 g/dL — ABNORMAL LOW (ref 30.0–36.0)
MCV: 91 fL (ref 78.0–100.0)
Monocytes Absolute: 1 10*3/uL (ref 0.1–1.0)
Monocytes Relative: 7 %
NEUTROS ABS: 13.6 10*3/uL — AB (ref 1.7–7.7)
NEUTROS PCT: 91 %
PLATELETS: 322 10*3/uL (ref 150–400)
RBC: 3.43 MIL/uL — AB (ref 3.87–5.11)
RDW: 14.2 % (ref 11.5–15.5)
WBC: 15 10*3/uL — AB (ref 4.0–10.5)

## 2016-02-24 LAB — COMPREHENSIVE METABOLIC PANEL
ALK PHOS: 134 U/L — AB (ref 38–126)
ALT: 449 U/L — AB (ref 14–54)
AST: 768 U/L — AB (ref 15–41)
Albumin: 3.7 g/dL (ref 3.5–5.0)
Anion gap: 8 (ref 5–15)
BILIRUBIN TOTAL: 1.8 mg/dL — AB (ref 0.3–1.2)
BUN: 17 mg/dL (ref 6–20)
CALCIUM: 9.4 mg/dL (ref 8.9–10.3)
CHLORIDE: 95 mmol/L — AB (ref 101–111)
CO2: 39 mmol/L — ABNORMAL HIGH (ref 22–32)
CREATININE: 1.05 mg/dL — AB (ref 0.44–1.00)
GFR calc Af Amer: 57 mL/min — ABNORMAL LOW (ref >60)
GFR, EST NON AFRICAN AMERICAN: 49 mL/min — AB (ref >60)
Glucose, Bld: 125 mg/dL — ABNORMAL HIGH (ref 65–99)
Potassium: 4.3 mmol/L (ref 3.5–5.1)
Sodium: 142 mmol/L (ref 135–145)
Total Protein: 7.7 g/dL (ref 6.5–8.1)

## 2016-02-24 LAB — I-STAT TROPONIN, ED: Troponin i, poc: 0.01 ng/mL (ref 0.00–0.08)

## 2016-02-24 LAB — SALICYLATE LEVEL

## 2016-02-24 LAB — LIPASE, BLOOD: Lipase: 20 U/L (ref 11–51)

## 2016-02-24 LAB — ETHANOL

## 2016-02-24 LAB — ACETAMINOPHEN LEVEL: Acetaminophen (Tylenol), Serum: 12 ug/mL (ref 10–30)

## 2016-02-24 LAB — URINE MICROSCOPIC-ADD ON

## 2016-02-24 LAB — PROTIME-INR
INR: 1.64
PROTHROMBIN TIME: 19.6 s — AB (ref 11.4–15.2)

## 2016-02-24 LAB — MRSA PCR SCREENING: MRSA BY PCR: NEGATIVE

## 2016-02-24 MED ORDER — VANCOMYCIN HCL IN DEXTROSE 1-5 GM/200ML-% IV SOLN
1000.0000 mg | INTRAVENOUS | Status: DC
  Filled 2016-02-24: qty 200

## 2016-02-24 MED ORDER — IPRATROPIUM BROMIDE 0.02 % IN SOLN
0.5000 mg | Freq: Four times a day (QID) | RESPIRATORY_TRACT | Status: DC | PRN
  Administered 2016-02-25: 0.5 mg via RESPIRATORY_TRACT
  Filled 2016-02-24: qty 2.5

## 2016-02-24 MED ORDER — PANTOPRAZOLE SODIUM 40 MG PO TBEC
40.0000 mg | DELAYED_RELEASE_TABLET | Freq: Every day | ORAL | Status: DC
  Administered 2016-02-25 – 2016-03-02 (×6): 40 mg via ORAL
  Filled 2016-02-24 (×6): qty 1

## 2016-02-24 MED ORDER — DEXTROSE 5 % IV SOLN
1.0000 g | INTRAVENOUS | Status: AC
  Administered 2016-02-24: 1 g via INTRAVENOUS
  Filled 2016-02-24: qty 1

## 2016-02-24 MED ORDER — DEXTROSE 5 % IV SOLN
1.0000 g | Freq: Two times a day (BID) | INTRAVENOUS | Status: DC
  Administered 2016-02-24 – 2016-02-27 (×4): 1 g via INTRAVENOUS
  Filled 2016-02-24 (×6): qty 1

## 2016-02-24 MED ORDER — VANCOMYCIN HCL IN DEXTROSE 1-5 GM/200ML-% IV SOLN
1000.0000 mg | INTRAVENOUS | Status: AC
  Administered 2016-02-24: 1000 mg via INTRAVENOUS
  Filled 2016-02-24: qty 200

## 2016-02-24 MED ORDER — SODIUM CHLORIDE 0.9 % IV SOLN
INTRAVENOUS | Status: DC
  Administered 2016-02-24: 1000 mL via INTRAVENOUS

## 2016-02-24 MED ORDER — LEVALBUTEROL HCL 0.63 MG/3ML IN NEBU
0.6300 mg | INHALATION_SOLUTION | Freq: Four times a day (QID) | RESPIRATORY_TRACT | Status: DC | PRN
Start: 2016-02-24 — End: 2016-03-02
  Administered 2016-02-25: 0.63 mg via RESPIRATORY_TRACT
  Filled 2016-02-24: qty 3

## 2016-02-24 MED ORDER — ONDANSETRON HCL 4 MG/2ML IJ SOLN: 4.0000 mg | Freq: Four times a day (QID) | INTRAMUSCULAR | Status: DC | PRN

## 2016-02-24 MED ORDER — DEXTROSE 5 % IV SOLN
1.0000 g | Freq: Three times a day (TID) | INTRAVENOUS | Status: DC
  Filled 2016-02-24 (×2): qty 1

## 2016-02-24 MED ORDER — RIVAROXABAN 20 MG PO TABS
20.0000 mg | ORAL_TABLET | Freq: Every day | ORAL | Status: DC
  Administered 2016-02-24: 20 mg via ORAL
  Filled 2016-02-24: qty 1

## 2016-02-24 MED ORDER — TIOTROPIUM BROMIDE MONOHYDRATE 18 MCG IN CAPS
18.0000 ug | ORAL_CAPSULE | Freq: Every day | RESPIRATORY_TRACT | Status: DC
  Administered 2016-02-26 – 2016-03-02 (×6): 18 ug via RESPIRATORY_TRACT
  Filled 2016-02-24 (×2): qty 5

## 2016-02-24 MED ORDER — TIOTROPIUM BROMIDE MONOHYDRATE 2.5 MCG/ACT IN AERS
2.0000 | INHALATION_SPRAY | Freq: Every day | RESPIRATORY_TRACT | Status: DC
  Filled 2016-02-24: qty 1

## 2016-02-24 MED ORDER — ACETAMINOPHEN 500 MG PO TABS
1000.0000 mg | ORAL_TABLET | Freq: Once | ORAL | Status: AC
  Administered 2016-02-24: 1000 mg via ORAL
  Filled 2016-02-24: qty 2

## 2016-02-24 NOTE — ED Notes (Signed)
Patient transported to X-ray 

## 2016-02-24 NOTE — ED Triage Notes (Signed)
Per EMS pt from [facility] assisted [facility], staff called for generalized weakness, reported pt unable to stand which is abnormal for her. EMS reports on their arrival pt was ambulatory to stretcher. Per EMS A+Ox 3

## 2016-02-24 NOTE — ED Provider Notes (Signed)
Patient complains of cough and shortness of breath. On exam chronically ill-appearing lungs with expiratory wheezes predominantly on left side posteriorly   Paula Dakin, MD 02/24/16 1548

## 2016-02-24 NOTE — H&P (Signed)
History and Physical    Paula Contreras ZHG:[id number] DOB: 04/06/1933 DOA: 02/24/2016  PCP: Vena Austria, MD   Patient coming from: [facility]  Chief Complaint: Cough, epigastric pain, generalized weakness  HPI: Paula Contreras is a 82 y.o. woman with a history of paroxysmal atrial fibrillation (CHADS-Vasc score of 4, anticoagulated with Xarelto), aortic stenosis (valve area around 1.5cm2 on last echo), COPD with chronic respiratory failure on 3L [place] at home at baseline, CKD 3, and dementia who presents to the ED for evaluation of generalized weakness, persistent cough, and fever.  She has had sweats and chills but no documented fever until tonight.  Cough is dry (she was seen in the ED one week ago for cough with hemoptysis; she reports that hemoptysis has resolved).  She also developed epigastric pain last night after dinner.  She denies eating excessively or having any foods that are not typical for her.  She had at least two episodes of nausea and nonbloody emesis.  She has one episode of light-headedness and near syncope but she denies LOC or actual falls.  No chest pain or shortness of breath.  ED Course: Rectal temp 101.6.  WBC count elevated to 15.  Hgb low but stable around 9.  Normal lactic acid levels x 2.  No frank infiltrate on chest xray but she has a small left pleural effusion.  ED attending concerned about HCAP and has initiated antibiotic coverage with vanc and cefepime.  LFTs also markedly abnormal; evaluation initiated by me at time of admission.  Patient denies excessive acetaminophen use.  No new medications.  Based on documented surgical history, gallbladder should still be intact.  Review of Systems: As per HPI otherwise 10 point review of systems negative.    Past Medical History:  Diagnosis Date  . Anemia 02/20/2015  . Aortic stenosis    a. 2D Echo 07/2013: EF 60-65%, no RWMA, grade 1 DD, normal LV filling pressure, moderate AS, mild TR, PASP 62mHg.  .  Bradycardia    a. possible bradycardia with HR 44 by physical exam notes at nephrologist office in 03/2014. F/u event monitor showed NSR PACs average HR 81, bradycardia <1% of readable data, no pauses of 3 seconds or longer..  . Cataract   . Chronic respiratory failure ([facility]    a. On home O2 since 2012.  . CKD (chronic kidney disease), stage III   . COPD (chronic obstructive pulmonary disease) ([facility]   . Dementia   . Hypertension   . Osteoporosis   . Other psoriasis and similar disorders    psoriasis vs eczema  . Paroxysmal atrial fibrillation ([facility]    a. Dx 07/2013. No attempts per Epic to restore NSR but in NSR 2016.  . Skin cancer     Past Surgical History:  Procedure Laterality Date  . BREAST SURGERY Right 1964   tumor removed  . CATARACT EXTRACTION Bilateral   . HIP SURGERY Right    fracture     reports that she quit smoking about 5 years ago. Her smoking use included Cigarettes. She has a 55.00 pack-year smoking history. She has never used smokeless tobacco. She reports that she does not drink alcohol or use drugs.  Allergies  Allergen Reactions  . Codeine Nausea And Vomiting    Family History  Problem Relation Age of Onset  . Asthma Mother   . Kidney disease Mother   . Lung cancer Father   . Asthma Maternal Grandmother   . Lung cancer Paternal Uncle  father's twin brother  . Diabetes Brother      Prior to Admission medications   Medication Sig Start Date End Date Taking? Authorizing Provider  acetaminophen (TYLENOL) 325 MG tablet Take 650 mg by mouth every 6 (six) hours as needed (for pain).    Yes Historical Provider, MD  albuterol (PROAIR HFA) 108 (90 BASE) MCG/ACT inhaler Inhale 2 puffs into the lungs every 4 (four) hours as needed for wheezing or shortness of breath. 12/24/14  Yes Collene Gobble, MD  alendronate (FOSAMAX) 70 MG tablet Take 70 mg by mouth every Monday. Take with a full glass of water on an empty stomach.   Yes Historical Provider, MD    calcium carbonate (OS-CAL) 600 MG TABS tablet Take 2 tablets by mouth daily with breakfast.    Yes Historical Provider, MD  Cholecalciferol (VITAMIN D) 2000 UNITS CAPS Take 2,000 Units by mouth daily with breakfast.    Yes Historical Provider, MD  clobetasol (TEMOVATE) 0.05 % external solution apply 4 drops to scalp one daily Monday- Thursday for psoriasis   Yes Historical Provider, MD  esomeprazole (NEXIUM) 20 MG capsule Take 20 mg by mouth daily at 12 noon.   Yes Historical Provider, MD  hydrocortisone 2.5 % cream Apply 1 application topically See admin instructions. Apply to Affected area topically to ears twice a day on Monday Tuesday Wednesday and Thursday   Yes Historical Provider, MD  ipratropium (ATROVENT) 0.02 % nebulizer solution Take 2.5 mLs (0.5 mg total) by nebulization every 2 (two) hours as needed for wheezing or shortness of breath. Patient taking differently: Take 0.5 mg by nebulization every 6 (six) hours as needed for wheezing or shortness of breath.  02/23/15  Yes Theodis Blaze, MD  ketoconazole (NIZORAL) 2 % shampoo Apply 1 application topically as directed.   Yes Historical Provider, MD  levalbuterol (XOPENEX) 0.63 MG/3ML nebulizer solution Take 3 mLs (0.63 mg total) by nebulization every 2 (two) hours as needed for wheezing or shortness of breath. Patient taking differently: Take 0.63 mg by nebulization every 6 (six) hours as needed for wheezing or shortness of breath.  02/23/15  Yes Theodis Blaze, MD  Multiple Vitamins-Iron (MULTIVITAMIN/IRON PO) Take 1 tablet by mouth daily.   Yes Historical Provider, MD  OXYGEN Inhale 2-3 L into the lungs continuous. Change to 3L when on portable   Yes Historical Provider, MD  rivaroxaban (XARELTO) 20 MG TABS tablet Take 20 mg by mouth daily.    Yes Historical Provider, MD  senna-docusate (SENOKOT-S) 8.6-50 MG tablet Take 1 tablet by mouth at bedtime as needed for mild constipation. 02/23/15  Yes Theodis Blaze, MD  Tiotropium Bromide  Monohydrate (SPIRIVA RESPIMAT) 2.5 MCG/ACT AERS Inhale 2 puffs into the lungs daily.   Yes Historical Provider, MD    Physical Exam: Vitals:   02/24/16 1247 02/24/16 1336 02/24/16 1649 02/24/16 1652  BP:  147/94  108/62  Pulse:  83  78  Resp:  20  18  Temp: (S) 101.6 F (38.7 C)  99.5 F (37.5 C) 99.5 F (37.5 C)  TempSrc: (S) Rectal  Rectal Rectal  SpO2:  100%  100%  Weight: 73.9 kg (163 lb)     Height: '5\' 7"'$  (1.702 m)         Constitutional: NAD, calm, hard of hearing but she is not decompensating. Vitals:   02/24/16 1247 02/24/16 1336 02/24/16 1649 02/24/16 1652  BP:  147/94  108/62  Pulse:  83  78  Resp:  20  18  Temp: (S) 101.6 F (38.7 C)  99.5 F (37.5 C) 99.5 F (37.5 C)  TempSrc: (S) Rectal  Rectal Rectal  SpO2:  100%  100%  Weight: 73.9 kg (163 lb)     Height: '5\' 7"'$  (1.702 m)      Eyes: PERRL, lids and conjunctivae normal ENMT: Mucous membranes are dry. Posterior pharynx clear of any exudate or lesions. Normal dentition.  Neck: normal appearance, supple Respiratory: Bilateral ronchi with intermittent wheezing.  Normal respiratory effort. No accessory muscle use.  Cardiovascular: Normal rate, regular rhythm.  No extremity edema. 2+ pedal pulses. GI: abdomen is soft and compressible.  No distention.  She has epigastric tenderness but no significant RUQ tenderness.  No guarding.  No rebound tenderness.  She does not appear to have an acute abdomen at this time.  Bowel sounds are present. Musculoskeletal:  No joint deformity in upper and lower extremities. Good ROM, no contractures. Normal muscle tone.  Skin: no rashes, warm and dry Neurologic: No focal deficits. Psychiatric: Normal judgment and insight. Alert and oriented x 3. Normal mood.     Labs on Admission: I have personally reviewed following labs and imaging studies  CBC:  Recent Labs Lab 02/24/16 1224  WBC 15.0*  NEUTROABS 13.6*  HGB 9.1*  HCT 31.2*  MCV 91.0  PLT 253   Basic Metabolic  Panel:  Recent Labs Lab 02/24/16 1224  NA 142  K 4.3  CL 95*  CO2 39*  GLUCOSE 125*  BUN 17  CREATININE 1.05*  CALCIUM 9.4   GFR: Estimated Creatinine Clearance: 41.6 mL/min (by C-G formula based on SCr of 1.05 mg/dL (H)). Liver Function Tests:  Recent Labs Lab 02/24/16 1224  AST 768*  ALT 449*  ALKPHOS 134*  BILITOT 1.8*  PROT 7.7  ALBUMIN 3.7   Urine analysis:    Component Value Date/Time   COLORURINE AMBER (A) 02/24/2016 1650   APPEARANCEUR CLEAR 02/24/2016 1650   LABSPEC 1.017 02/24/2016 1650   PHURINE 7.5 02/24/2016 1650   GLUCOSEU NEGATIVE 02/24/2016 1650   HGBUR NEGATIVE 02/24/2016 1650   BILIRUBINUR SMALL (A) 02/24/2016 1650   KETONESUR NEGATIVE 02/24/2016 1650   PROTEINUR 30 (A) 02/24/2016 1650   UROBILINOGEN 0.2 01/11/2015 2337   NITRITE NEGATIVE 02/24/2016 1650   LEUKOCYTESUR NEGATIVE 02/24/2016 1650   Sepsis Labs:  Lactic acid level 1.12, then 0.72  Radiological Exams on Admission: Dg Chest 2 View  Result Date: 02/24/2016 CLINICAL DATA:  Shortness of Breath EXAM: CHEST  2 VIEW COMPARISON:  02/16/2016 FINDINGS: Cardiac shadow is stable. The lungs are well aerated bilaterally with diffuse interstitial changes stable from the prior study. No focal infiltrate is seen. Minimal left effusion is noted. Multiple compression deformities are noted within the thoracic spine. Aortic calcifications are noted. IMPRESSION: Small left-sided pleural effusion.  No focal infiltrate is seen. Electronically Signed   By: Inez Catalina M.D.   On: 02/24/2016 13:07    Assessment/Plan Principal Problem:   HCAP (healthcare-associated pneumonia) Active Problems:   Hypertension   COPD (chronic obstructive pulmonary disease) (HCC)   Aortic valve disorder   Chronic respiratory failure (HCC)   PAF (paroxysmal atrial fibrillation) (HCC)   CKD (chronic kidney disease), stage III   Abnormal LFTs   Epigastric pain   Nausea and vomiting      HCAP with history of COPD,  chronic respiratory failure, 3L  at baseline --Agree with coverage for HCAP with IV cefepime and vancomycin for now; pharmacy to dose --Blood  cultures --Urine legionella and streptococcal antigens --Respiratory therapy --Continue tiotropium inhaler, prn levalbuterol/ipratropium nebs --She is on her baseline O2 requirement of 3L  Abnormal LFTs, differential includes acute cholecystitis, acute gallstone pancreatitis, acute hepatitis, acute toxicity, acute liver failure --STAT labs pending include acetaminophen, salicylate, and EtOH levels, acute hepatitis panel, coags.  Repeat LFTs now to look for trend --STAT RUQ ultrasound pending (CT with contrast deferred for now due to history of CKD) --Caution with acetaminophen use for now; she received 1 gram for fever in the ED  Near syncopal episode --Check orthostatics --Gentle hydration with NS at 100cc/hr x 5 hours --Fall precautions  HTN --She does not appear to be on any anti-hypertensives at baseline.  Atrial fibrillation --No EKG done in the ED tonight.  Last EKG one week ago showed NSR with PVCs. --On Xarelto.  May need to hold should she need invasive testing/procedures.     DVT prophylaxis: Anticoagulated with Xarelto, SCDs (In the event that Xarelto is held for invasive testing) Code Status: DNR/DNI Family Communication: Patient alone in the ED at time of admission. Disposition Plan: To be determined. Consults called: NONE but may need GI and/or gen surg based on results of pending tests. Admission status: Inpatient, med surg.  This patient has HCAP as well as abnormal LFTs, which need further evaluation.  She is at risk for complications including respiratory failure, sepsis, and acute liver failure and requires inpatient services.  I expect that she will be here for two midnights.   TIME SPENT: 70 minutes   Eber Jones MD [facility] Pager [phone]  If 7PM-7AM, please contact  night-coverage [email or web address] Password [facility]  02/24/2016, 7:26 PM

## 2016-02-24 NOTE — ED Provider Notes (Signed)
Complains of generalized weakness, cough and shortness of breath. On exam chronically ill-appearing speaks in sentences. Lungs with long history phase and expiratory wheezes. Chest x-ray viewed by me   Orlie Dakin, MD 02/24/16 1436

## 2016-02-24 NOTE — ED Notes (Signed)
WILL TRANSPORT PT TO 5W 1533-1. AAOX2. PT IN NO APPARENT DISTRESS OR PAIN. THE OPPORTUNITY TO ASK QUESTIONS WAS PROVIDED.

## 2016-02-24 NOTE — ED Provider Notes (Signed)
[facility] DEPT Provider Note   CSN: [id number] Arrival date & time: 02/24/16  1056     History   Chief Complaint Chief Complaint  Patient presents with  . Weakness    HPI Paula Contreras is a 82 y.o. female.  Patient with past medical history of dementia, COPD, presents to the emergency department with chief complaint of generalized weakness. She is accompanied by family members, state that the patient has not been able to stand today. Patient reports having a cough. She is noted to be mildly febrile at 99.8 today. She has had a productive cough. She states that last night she felt short of breath, and thought that it was indigestion, and took some Maalox. She wears 4 L of oxygen at all times. She denies any pain in her chest. She denies any abdominal pain. Denies any other associated symptoms.   The history is provided by the patient. No language interpreter was used.    Past Medical History:  Diagnosis Date  . Anemia 02/20/2015  . Aortic stenosis    a. 2D Echo 07/2013: EF 60-65%, no RWMA, grade 1 DD, normal LV filling pressure, moderate AS, mild TR, PASP 40mHg.  . Bradycardia    a. possible bradycardia with HR 44 by physical exam notes at nephrologist office in 03/2014. F/u event monitor showed NSR PACs average HR 81, bradycardia <1% of readable data, no pauses of 3 seconds or longer..  . Cataract   . Chronic respiratory failure ([facility]    a. On home O2 since 2012.  . CKD (chronic kidney disease), stage III   . COPD (chronic obstructive pulmonary disease) ([facility]   . Dementia   . Hypertension   . Osteoporosis   . Other psoriasis and similar disorders    psoriasis vs eczema  . Paroxysmal atrial fibrillation ([facility]    a. Dx 07/2013. No attempts per Epic to restore NSR but in NSR 2016.  . Skin cancer     Patient Active Problem List   Diagnosis Date Noted  . History of bradycardia 10/27/2015  . SOB (shortness of breath)   . Encounter for palliative care   .  Hypernatremia 02/21/2015  . Altered mental status 02/20/2015  . Metabolic encephalopathy 174/94/4967 . Weakness generalized 02/20/2015  . Dehydration 02/20/2015  . Leukocytosis 02/20/2015  . Anemia 02/20/2015  . Bronchitis 02/20/2015  . Fall at nursing home 02/20/2015  . Bradycardia 04/09/2014  . Chronic respiratory failure ([facility]   . Aortic stenosis   . PAF (paroxysmal atrial fibrillation) ([facility] [place]   . CKD (chronic kidney disease), stage III   . Aortic valve disorder 10/30/2013  . Undiagnosed cardiac murmurs 07/21/2013  . Hypertension 10/02/2010  . COPD (chronic obstructive pulmonary disease) ([facility] 10/02/2010    Past Surgical History:  Procedure Laterality Date  . BREAST SURGERY Right 1964   tumor removed  . CATARACT EXTRACTION Bilateral   . HIP SURGERY Right    fracture    OB History    No data available       Home Medications    Prior to Admission medications   Medication Sig Start Date End Date Taking? Authorizing Provider  acetaminophen (TYLENOL) 325 MG tablet Take 650 mg by mouth every 6 (six) hours as needed (for pain).     Historical Provider, MD  albuterol (PROAIR HFA) 108 (90 BASE) MCG/ACT inhaler Inhale 2 puffs into the lungs every 4 (four) hours as needed for wheezing or shortness of breath. 12/24/14   RCollene Gobble  MD  alendronate (FOSAMAX) 70 MG tablet Take 70 mg by mouth every Monday. Take with a full glass of water on an empty stomach.    Historical Provider, MD  calcium carbonate (OS-CAL) 600 MG TABS tablet Take 2 tablets by mouth daily with breakfast.     Historical Provider, MD  Cholecalciferol (VITAMIN D) 2000 UNITS CAPS Take 2,000 Units by mouth daily with breakfast.     Historical Provider, MD  clobetasol (TEMOVATE) 0.05 % external solution apply 4 drops to scalp one daily Monday- Thursday for psoriasis    Historical Provider, MD  esomeprazole (NEXIUM) 20 MG capsule Take 20 mg by mouth daily at 12 noon.    Historical Provider, MD  hydrocortisone 2.5 %  cream Apply 1 application topically See admin instructions. Apply to Affected area topically to ears twice a day on Monday Tuesday Wednesday and Thursday    Historical Provider, MD  ipratropium (ATROVENT) 0.02 % nebulizer solution Take 2.5 mLs (0.5 mg total) by nebulization every 2 (two) hours as needed for wheezing or shortness of breath. 02/23/15   Theodis Blaze, MD  ketoconazole (NIZORAL) 2 % shampoo Apply 1 application topically as directed.    Historical Provider, MD  levalbuterol Penne Lash) 0.63 MG/3ML nebulizer solution Take 3 mLs (0.63 mg total) by nebulization every 2 (two) hours as needed for wheezing or shortness of breath. 02/23/15   Theodis Blaze, MD  Multiple Vitamins-Iron (MULTIVITAMIN/IRON PO) Take 1 tablet by mouth daily.    Historical Provider, MD  OXYGEN Inhale 2-3 L into the lungs continuous. Change to 3L when on portable    Historical Provider, MD  rivaroxaban (XARELTO) 20 MG TABS tablet Take 20 mg by mouth daily with supper.    Historical Provider, MD  senna-docusate (SENOKOT-S) 8.6-50 MG tablet Take 1 tablet by mouth at bedtime as needed for mild constipation. 02/23/15   Theodis Blaze, MD  Tiotropium Bromide Monohydrate (SPIRIVA RESPIMAT) 2.5 MCG/ACT AERS Inhale 2 puffs into the lungs daily.    Historical Provider, MD    Family History Family History  Problem Relation Age of Onset  . Asthma Mother   . Kidney disease Mother   . Lung cancer Father   . Asthma Maternal Grandmother   . Lung cancer Paternal Uncle     father's twin brother  . Diabetes Brother     Social History Social History  Substance Use Topics  . Smoking status: Former Smoker    Packs/day: 1.00    Years: 55.00    Types: Cigarettes    Quit date: 09/25/2010  . Smokeless tobacco: Never Used  . Alcohol use No     Allergies   Codeine   Review of Systems Review of Systems  Constitutional: Positive for fever.  Respiratory: Positive for cough and shortness of breath.   All other systems reviewed  and are negative.    Physical Exam Updated Vital Signs BP 125/69 (BP Location: Left Arm)   Pulse 91   Temp 99.8 F (37.7 C) (Oral)   Resp 22   SpO2 98%   Physical Exam  Constitutional:  Frail appearing  HENT:  Head: Normocephalic and atraumatic.  Eyes: Conjunctivae and EOM are normal. Pupils are equal, round, and reactive to light.  Neck: Normal range of motion. Neck supple.  Cardiovascular: Normal rate and regular rhythm.  Exam reveals no gallop and no friction rub.   No murmur heard. Pulmonary/Chest: Effort normal. No respiratory distress. She has no wheezes. She has no rales. She  exhibits no tenderness.  Crackles bilaterally  Abdominal: Soft. Bowel sounds are normal. She exhibits no distension and no mass. There is no tenderness. There is no rebound and no guarding.  Musculoskeletal: Normal range of motion. She exhibits no edema or tenderness.  Neurological: She is alert.  Demented, mostly able to answer questions appropriately  Skin: Skin is warm and dry.  Psychiatric: She has a normal mood and affect. Her behavior is normal. Judgment and thought content normal.  Nursing note and vitals reviewed.    ED Treatments / Results  Labs (all labs ordered are listed, but only abnormal results are displayed) Labs Reviewed  CBC WITH DIFFERENTIAL/PLATELET - Abnormal; Notable for the following:       Result Value   WBC 15.0 (*)    RBC 3.43 (*)    Hemoglobin 9.1 (*)    HCT 31.2 (*)    MCHC 29.2 (*)    Neutro Abs 13.6 (*)    Lymphs Abs 0.3 (*)    All other components within normal limits  COMPREHENSIVE METABOLIC PANEL - Abnormal; Notable for the following:    Chloride 95 (*)    CO2 39 (*)    Glucose, Bld 125 (*)    Creatinine, Ser 1.05 (*)    AST 768 (*)    ALT 449 (*)    Alkaline Phosphatase 134 (*)    Total Bilirubin 1.8 (*)    GFR calc non Af Amer 49 (*)    GFR calc Af Amer 57 (*)    All other components within normal limits  URINALYSIS, ROUTINE W REFLEX  MICROSCOPIC (NOT AT [facility]) - Abnormal; Notable for the following:    Color, Urine AMBER (*)    Bilirubin Urine SMALL (*)    Protein, ur 30 (*)    All other components within normal limits  URINE MICROSCOPIC-ADD ON - Abnormal; Notable for the following:    Squamous Epithelial / LPF 0-5 (*)    Bacteria, UA RARE (*)    All other components within normal limits  SALICYLATE LEVEL  ACETAMINOPHEN LEVEL  ETHANOL  HEPATITIS PANEL, ACUTE  PROTIME-INR  I-STAT CG4 LACTIC ACID, ED  I-STAT TROPOININ, ED  I-STAT CG4 LACTIC ACID, ED    EKG  EKG Interpretation None       Radiology Dg Chest 2 View  Result Date: 02/24/2016 CLINICAL DATA:  Shortness of Breath EXAM: CHEST  2 VIEW COMPARISON:  02/16/2016 FINDINGS: Cardiac shadow is stable. The lungs are well aerated bilaterally with diffuse interstitial changes stable from the prior study. No focal infiltrate is seen. Minimal left effusion is noted. Multiple compression deformities are noted within the thoracic spine. Aortic calcifications are noted. IMPRESSION: Small left-sided pleural effusion.  No focal infiltrate is seen. Electronically Signed   By: Inez Catalina M.D.   On: 02/24/2016 13:07    Procedures Procedures (including critical care time)  Medications Ordered in ED Medications - No data to display   Initial Impression / Assessment and Plan / ED Course  I have reviewed the triage vital signs and the nursing notes.  Pertinent labs & imaging results that were available during my care of the patient were reviewed by me and considered in my medical decision making (see chart for details).  Clinical Course     Patient with cough and weakness. Brought in by family from nursing home. Patient noted to have productive cough, and some fine basilar crackles on exam. Moderate leukocytosis to 15, and febrile to 11.6. No abdominal  tenderness.  Patient seen by and discussed with Dr. Cathleen Fears, who agrees plan for admission.  Unclear etiology  of elevated LFTs, patient does not have right upper quadrant tenderness on my initial exam. (reassessed 6:47 PM), still no abdominal tenderness. Discussed these findings with Dr. Eulas Post of internal medicine team.    Patient with cough, fever, and lives in nursing home, feel the patient should be admitted for HCAP.  Final Clinical Impressions(s) / ED Diagnoses   Final diagnoses:  HCAP (healthcare-associated pneumonia)    New Prescriptions New Prescriptions   No medications on file     Montine Circle, PA-C 02/24/16 [place], MD 02/25/16 1043

## 2016-02-24 NOTE — Progress Notes (Addendum)
Pharmacy Antibiotic Note  Rethel Sebek is a 82 y.o. female admitted on 02/24/2016 with suspected pneumonia.  She c/o generalized weakness, productive cough, fever.  Pharmacy has been consulted for Vancomycin dosing and renal antibiotic adjustment.    CXR (11/24): Small left-sided pleural effusion.  No focal infiltrate is seen. Lactic acid 1.12 > 0.72 WBC 15 Tm 101.6  Plan:  Cefepime 1g IV q12h  Vancomycin 1g IV stat, then 1g IV q24h.   Measure Vanc trough at steady state.  Follow up renal fxn, culture results, and clinical course.  Height: '5\' 7"'$  (170.2 cm) Weight: 163 lb (73.9 kg) IBW/kg (Calculated) : 61.6  Temp (24hrs), Avg:100.7 F (38.2 C), Min:99.8 F (37.7 C), Max:101.6 F (38.7 C)   Recent Labs Lab 02/24/16 1224 02/24/16 1254  WBC 15.0*  --   LATICACIDVEN  --  1.12    Estimated Creatinine Clearance: 33.6 mL/min (by C-G formula based on SCr of 1.3 mg/dL (H)).    Allergies  Allergen Reactions  . Codeine Nausea And Vomiting    Antimicrobials this admission: 11/24 Cefepime >> 11/24 Vancomycin >>   Dose adjustments this admission:   Microbiology results: 11/24 Blood: sent  Thank you for allowing pharmacy to be a part of this patient's care.  Gretta Arab PharmD, BCPS Pager [phone] 02/24/2016 1:17 PM

## 2016-02-25 ENCOUNTER — Inpatient Hospital Stay (HOSPITAL_COMMUNITY): Payer: Medicare Other

## 2016-02-25 ENCOUNTER — Encounter (HOSPITAL_COMMUNITY): Payer: Self-pay | Admitting: Radiology

## 2016-02-25 DIAGNOSIS — Z7901 Long term (current) use of anticoagulants: Secondary | ICD-10-CM

## 2016-02-25 DIAGNOSIS — K830 Cholangitis: Secondary | ICD-10-CM

## 2016-02-25 DIAGNOSIS — K8050 Calculus of bile duct without cholangitis or cholecystitis without obstruction: Secondary | ICD-10-CM

## 2016-02-25 DIAGNOSIS — K8309 Other cholangitis: Secondary | ICD-10-CM

## 2016-02-25 LAB — CBC WITH DIFFERENTIAL/PLATELET
BASOS ABS: 0 10*3/uL (ref 0.0–0.1)
Basophils Relative: 0 %
EOS PCT: 0 %
Eosinophils Absolute: 0 10*3/uL (ref 0.0–0.7)
HEMATOCRIT: 29.4 % — AB (ref 36.0–46.0)
Hemoglobin: 8.5 g/dL — ABNORMAL LOW (ref 12.0–15.0)
LYMPHS ABS: 0.4 10*3/uL — AB (ref 0.7–4.0)
LYMPHS PCT: 4 %
MCH: 26.2 pg (ref 26.0–34.0)
MCHC: 28.9 g/dL — AB (ref 30.0–36.0)
MCV: 90.5 fL (ref 78.0–100.0)
MONO ABS: 0.2 10*3/uL (ref 0.1–1.0)
MONOS PCT: 2 %
NEUTROS ABS: 10.6 10*3/uL — AB (ref 1.7–7.7)
Neutrophils Relative %: 94 %
PLATELETS: 304 10*3/uL (ref 150–400)
RBC: 3.25 MIL/uL — ABNORMAL LOW (ref 3.87–5.11)
RDW: 14.4 % (ref 11.5–15.5)
WBC: 11.3 10*3/uL — ABNORMAL HIGH (ref 4.0–10.5)

## 2016-02-25 LAB — COMPREHENSIVE METABOLIC PANEL
ALT: 364 U/L — ABNORMAL HIGH (ref 14–54)
AST: 434 U/L — AB (ref 15–41)
Albumin: 3.2 g/dL — ABNORMAL LOW (ref 3.5–5.0)
Alkaline Phosphatase: 158 U/L — ABNORMAL HIGH (ref 38–126)
Anion gap: 8 (ref 5–15)
BILIRUBIN TOTAL: 3.2 mg/dL — AB (ref 0.3–1.2)
BUN: 18 mg/dL (ref 6–20)
CO2: 36 mmol/L — ABNORMAL HIGH (ref 22–32)
Calcium: 8.8 mg/dL — ABNORMAL LOW (ref 8.9–10.3)
Chloride: 98 mmol/L — ABNORMAL LOW (ref 101–111)
Creatinine, Ser: 1.04 mg/dL — ABNORMAL HIGH (ref 0.44–1.00)
GFR, EST AFRICAN AMERICAN: 57 mL/min — AB (ref >60)
GFR, EST NON AFRICAN AMERICAN: 49 mL/min — AB (ref >60)
Glucose, Bld: 114 mg/dL — ABNORMAL HIGH (ref 65–99)
POTASSIUM: 4.5 mmol/L (ref 3.5–5.1)
Sodium: 142 mmol/L (ref 135–145)
TOTAL PROTEIN: 6.9 g/dL (ref 6.5–8.1)

## 2016-02-25 LAB — APTT: APTT: 57 s — AB (ref 24–36)

## 2016-02-25 LAB — HEPARIN LEVEL (UNFRACTIONATED): HEPARIN UNFRACTIONATED: 2.01 [IU]/mL — AB (ref 0.30–0.70)

## 2016-02-25 LAB — STREP PNEUMONIAE URINARY ANTIGEN: Strep Pneumo Urinary Antigen: NEGATIVE

## 2016-02-25 MED ORDER — IOPAMIDOL (ISOVUE-300) INJECTION 61%
100.0000 mL | Freq: Once | INTRAVENOUS | Status: AC | PRN
  Administered 2016-02-25: 100 mL via INTRAVENOUS

## 2016-02-25 MED ORDER — IOPAMIDOL (ISOVUE-300) INJECTION 61%
INTRAVENOUS | Status: AC
  Filled 2016-02-25: qty 100

## 2016-02-25 MED ORDER — LEVALBUTEROL HCL 0.63 MG/3ML IN NEBU
0.6300 mg | INHALATION_SOLUTION | Freq: Three times a day (TID) | RESPIRATORY_TRACT | Status: DC
  Administered 2016-02-25 – 2016-03-02 (×19): 0.63 mg via RESPIRATORY_TRACT
  Filled 2016-02-25 (×20): qty 3

## 2016-02-25 MED ORDER — HEPARIN (PORCINE) IN NACL 100-0.45 UNIT/ML-% IJ SOLN
900.0000 [IU]/h | INTRAMUSCULAR | Status: DC
  Administered 2016-02-25: 1000 [IU]/h via INTRAVENOUS
  Filled 2016-02-25: qty 250

## 2016-02-25 MED ORDER — METHYLPREDNISOLONE SODIUM SUCC 125 MG IJ SOLR
80.0000 mg | Freq: Once | INTRAMUSCULAR | Status: AC
  Administered 2016-02-25: 80 mg via INTRAVENOUS
  Filled 2016-02-25: qty 1.28

## 2016-02-25 MED ORDER — GUAIFENESIN ER 600 MG PO TB12
600.0000 mg | ORAL_TABLET | Freq: Two times a day (BID) | ORAL | Status: DC
  Administered 2016-02-25 – 2016-03-02 (×10): 600 mg via ORAL
  Filled 2016-02-25 (×11): qty 1

## 2016-02-25 MED ORDER — SODIUM CHLORIDE 0.9 % IJ SOLN
INTRAMUSCULAR | Status: AC
  Filled 2016-02-25: qty 50

## 2016-02-25 MED ORDER — FLUTICASONE PROPIONATE 50 MCG/ACT NA SUSP
1.0000 | Freq: Every day | NASAL | Status: DC
  Administered 2016-02-25 – 2016-03-02 (×7): 1 via NASAL
  Filled 2016-02-25: qty 16

## 2016-02-25 NOTE — Progress Notes (Signed)
ANTICOAGULATION CONSULT NOTE - Initial Consult  Pharmacy Consult for Heparin Indication: atrial fibrillation-->bridge while off xarelto  Allergies  Allergen Reactions  . Codeine Nausea And Vomiting    Patient Measurements: Height: '5\' 7"'$  (170.2 cm) Weight: 163 lb (73.9 kg) IBW/kg (Calculated) : 61.6   Vital Signs: Temp: 99.4 F (37.4 C) (11/25 1429) Temp Source: Oral (11/25 1429) BP: 110/60 (11/25 1429) Pulse Rate: 70 (11/25 1429)  Labs:  Recent Labs  02/24/16 1224 02/24/16 1901 02/25/16 0518  HGB 9.1*  --  8.5*  HCT 31.2*  --  29.4*  PLT 322  --  304  LABPROT  --  19.6*  --   INR  --  1.64  --   CREATININE 1.05*  --  1.04*    Estimated Creatinine Clearance: 42 mL/min (by C-G formula based on SCr of 1.04 mg/dL (H)).   Medical History: Past Medical History:  Diagnosis Date  . Anemia 02/20/2015  . Aortic stenosis    a. 2D Echo 07/2013: EF 60-65%, no RWMA, grade 1 DD, normal LV filling pressure, moderate AS, mild TR, PASP 77mHg.  . Bradycardia    a. possible bradycardia with HR 44 by physical exam notes at nephrologist office in 03/2014. F/u event monitor showed NSR PACs average HR 81, bradycardia <1% of readable data, no pauses of 3 seconds or longer..  . Cataract   . [place] ([place]    a. On home O2 since 2012.  . CKD (chronic kidney disease), stage III   . COPD (ch[place] obstructive pulmonary disease) ([place]   . Dementia   . Hypertension   . Osteoporosis   . Other psoriasis and similar disorders    psoriasis vs eczema  . Paroxysmal atrial fibrillation ([place]    a. Dx 07/2013. No attempts per Epic to restore NSR but in NSR 2016.  . Skin cancer     Medications:  Infusions:    Assessment: 82yo F admitted with cholangitis and need for ERCP on Monday.  She is chronically on Xarelto for her Afib (CHADS2Vasc=4).  Last dose 11/24 @ 2118.   Baseline CBC reviewed- Hg low & decreased since admission.  Pltc WNL.  No bleeding noted.  Renal  function at patient's baseline.  Estimated CrCl 40-484mmin.  Baseline heparin level & aPTT pending.  Anticipate "heparin level" (anti-Xa level) will be falsely elevated due to effects of Xarelto.  Will use aPTTs to guide heparin therapy until heparin levels correlate with aPTTs.   Goal of Therapy:  Heparin level 0.3-0.7 units/ml aPTT 66-102s seconds Monitor platelets by anticoagulation protocol: Yes   Plan:  At 9pm, start heparin infusion at 1000 units/hr Check 8hr aPTT & heparin level Daily aPTT & heparin level F/U plans to hold heparin peri-procedure on Monday  MiNetta CedarsPharmD, BCPS Pager: [phone] 08021/25/2017,2:37 PM

## 2016-02-25 NOTE — Progress Notes (Signed)
PROGRESS NOTE  Paula Contreras XKG:[id number] DOB: July 06, 1933 DOA: 02/24/2016 PCP: Vena Austria, MD  HPI/Recap of past 24 hours:  Fever resolved, feeling better this am, family at bedside  She has coughing spells after drinking fluids  Assessment/Plan: Principal Problem:   Ascending cholangitis Active Problems:   Hypertension   COPD (chronic obstructive pulmonary disease) (HCC)   Aortic valve disorder   Chronic respiratory failure (HCC)   PAF (paroxysmal atrial fibrillation) (HCC)   CKD (chronic kidney disease), stage III   HCAP (healthcare-associated pneumonia)   Abnormal LFTs   Epigastric pain   Nausea and vomiting   Common bile duct stone   Chronic anticoagulation - Xerelto   Cholodocholithiasis. Cholangitis: lft remain elevated Continue cefepime, gi and general surgery consulted,  she is currently on clears, plan to have ercp on 11/27 ( awaiting xarelto washout)   COPD with hypoxic respiratory failure on chronic home 02,  Stable on xopenex and spiriva  PAF:  currently sinus rhythm, not on rate control agent or antiarrythmic meds. xarelto held for planned ecrp May need to hold xarelto for a prolonged period due to planned ercp and possible cholecystectomy start heparin drip   H/o Aortic stenosis: stable, per cardiology office note  Code Status: DNR  Family Communication: patient and family at bedside  Disposition Plan: remain inpatient, return to [facility] eventually   Consultants:  LBGI  General surgery  Procedures:  Plan to have ERCP on 11/27  Antibiotics:  vanc from admission to 11/25  Cefepime from admission   Objective: BP 115/62 (BP Location: Left Arm)   Pulse 69   Temp 98.4 F (36.9 C) (Oral)   Resp 18   Ht '5\' 7"'$  (1.702 m)   Wt 73.9 kg (163 lb)   SpO2 97%   BMI 25.53 kg/m   Intake/Output Summary (Last 24 hours) at 02/25/16 1310 Last data filed at 02/25/16 0600  Gross per 24 hour  Intake              300 ml  Output                 0 ml  Net              300 ml   Filed Weights   02/24/16 1247  Weight: 73.9 kg (163 lb)    Exam:   General:  NAD, hard of hearing  Cardiovascular: RRR, + systolic murmur 2/6 best heard at right upper sternal border  Respiratory: mild diffuse wheezing  Abdomen: Soft/ND/NT, positive BS  Musculoskeletal: trace lower extremity Edema  Neuro: aaox3, impaired memory , likely at baseline  Data Reviewed: Basic Metabolic Panel:  Recent Labs Lab 02/24/16 1224 02/25/16 0518  NA 142 142  K 4.3 4.5  CL 95* 98*  CO2 39* 36*  GLUCOSE 125* 114*  BUN 17 18  CREATININE 1.05* 1.04*  CALCIUM 9.4 8.8*   Liver Function Tests:  Recent Labs Lab 02/24/16 1224 02/24/16 1901 02/25/16 0518  AST 768* 601* 434*  ALT 449* 390* 364*  ALKPHOS 134* 141* 158*  BILITOT 1.8* 2.6* 3.2*  PROT 7.7 6.8 6.9  ALBUMIN 3.7 3.5 3.2*    Recent Labs Lab 02/24/16 1901  LIPASE 20   No results for input(s): AMMONIA in the last 168 hours. CBC:  Recent Labs Lab 02/24/16 1224 02/25/16 0518  WBC 15.0* 11.3*  NEUTROABS 13.6* 10.6*  HGB 9.1* 8.5*  HCT 31.2* 29.4*  MCV 91.0 90.5  PLT 322 304   Cardiac Enzymes:  No results for input(s): CKTOTAL, CKMB, CKMBINDEX, TROPONINI in the last 168 hours. BNP (last 3 results)  Recent Labs  02/16/16 1850  BNP 109.8*    ProBNP (last 3 results) No results for input(s): PROBNP in the last 8760 hours.  CBG: No results for input(s): GLUCAP in the last 168 hours.  Recent Results (from the past 240 hour(s))  MRSA PCR Screening     Status: None   Collection Time: 02/24/16  9:25 PM  Result Value Ref Range Status   MRSA by PCR NEGATIVE NEGATIVE Final    Comment:        The GeneXpert MRSA Assay (FDA approved for NASAL specimens only), is one component of a comprehensive MRSA colonization surveillance program. It is not intended to diagnose MRSA infection nor to guide or monitor treatment for MRSA infections.      Studies: Ct  Abdomen Pelvis W Contrast  Result Date: 02/25/2016 CLINICAL DATA:  82 y/o  F; general weakness and mild fever. EXAM: CT ABDOMEN AND PELVIS WITH CONTRAST TECHNIQUE: Multidetector CT imaging of the abdomen and pelvis was performed using the standard protocol following bolus administration of intravenous contrast. CONTRAST:  163m ISOVUE-300 IOPAMIDOL (ISOVUE-300) INJECTION 61% COMPARISON:  02/24/2016 abdominal ultrasound. 02/24/2016 chest radiograph. 04/05/2014 lumbar spine radiograph. FINDINGS: Lower chest: Small left pleural effusion. Moderate hiatal hernia. Coronary artery calcifications. Hepatobiliary: No focal liver abnormality. Mild distention of the bladder and common bile duct measuring up to 12 mm. 2 mm gallstone within the lower common bile duct (series 5, image 47). Pancreas: Unremarkable. No pancreatic ductal dilatation or surrounding inflammatory changes. Spleen: Normal in size without focal abnormality. Adrenals/Urinary Tract: Adrenal glands are unremarkable. Kidneys are normal, without renal calculi, focal lesion, or hydronephrosis. Bladder is unremarkable. Stomach/Bowel: Stomach is within normal limits. Appendix appears normal. No evidence of bowel wall thickening, distention, or inflammatory changes. Vascular/Lymphatic: Aortic atherosclerosis. No enlarged abdominal or pelvic lymph nodes. 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 Reproductive: Uterus is unremarkable. 6.8 cm right adnexal cystic lesion. No appreciable enhancing nodular components. Other: No abdominal wall hernia or abnormality. No abdominopelvic ascites. Musculoskeletal: T10 and L5 vertebral body compression deformities and L4 inferior endplate compression deformity stable in comparison with prior lumbar and chest radiographs. Moderate L1 compression deformity is new from prior radiographs. IMPRESSION: 1. Mild dilatation of common bile duct with 2 mm choledocholithiasis in the lower duct within head of  pancreas. Given elevated liver function tests obstruction is suspected. No pancreatic ductal dilatation. 2. Moderate L1 compression deformity new from prior radiographs, age indeterminate. 3. Small left pleural effusion. 4. Moderate hiatal hernia. 5. Coronary artery calcification. 6. 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 7. **An incidental finding of potential clinical significance has been found. 6.8 cm right adnexal cystic lesion. Further evaluation with pelvic ultrasound is recommended on a nonemergent basis. This recommendation follows ACR consensus guidelines: White Paper of the ACR Incidental Findings Committee II on Adnexal Findings. J Am Coll Radiol [phone]** These results will be called to the ordering clinician or representative by the Radiologist Assistant, and communication documented in the PACS or zVision Dashboard. Electronically Signed   By: LKristine GarbeM.D.   On: 02/25/2016 04:13   [place] Limited Ruq  Result Date: 02/24/2016 CLINICAL DATA:  Postprandial right upper quadrant and upper abdominal pain beginning on 02/23/2016. History of aortic stenosis, chronic kidney disease, and hypertension. EXAM: [place] LIMITED - RIGHT UPPER QUADRANT COMPARISON:  None. FINDINGS:  Gallbladder: Limited visualization. No gallstones or wall thickening visualized. No sonographic Murphy sign noted by sonographer. Common bile duct: Diameter: Ranges from 3.8 to 8.4 mm. No filling defects demonstrated. Liver: Limited visualization. No focal lesion identified. Liver parenchymal echotexture appears diffusely decreased. Examination is technically limited, likely due to combination of patient body habitus and labored breathing. IMPRESSION: Technically limited examination with poor penetration of the liver and gallbladder but no gross evidence of cholelithiasis or cholecystitis. Distal bile ducts are at the upper limits of normal range but no significant bile  duct dilatation. Electronically Signed   By: Lucienne Capers M.D.   On: 02/24/2016 23:22    Scheduled Meds: . ceFEPime (MAXIPIME) IV  1 g Intravenous Q12H  . iopamidol      . levalbuterol  0.63 mg Nebulization TID  . pantoprazole  40 mg Oral Daily  . sodium chloride      . tiotropium  18 mcg Inhalation Daily    Continuous Infusions:   Time spent: 63mns  Rethel Sebek MD, PhD  [facility] Pager [phone] If 7PM-7AM, please contact night-coverage at [email or web address], password [email or web address]11/25/2017, 1:10 PM  LOS: 1 day

## 2016-02-25 NOTE — Progress Notes (Signed)
Radiology called for patients CT scan result. Notified attending provider Dr.Carter,Nikki.

## 2016-02-25 NOTE — Consult Note (Signed)
[place]  Contreras., Paula Contreras, Paula Contreras     Paula Contreras  [birth year] [id number]   02/25/2016  CARE TEAM:  PCP: Vena Austria, MD  Outpatient Care Team: Patient Care Team: Maury Dus, MD as PCP - General (Family Medicine) Irene Shipper, MD as Consulting Physician (Gastroenterology) Collene Gobble, MD as Consulting Physician (Pulmonary Disease) Jettie Booze, MD as Consulting Physician (Cardiology)  Inpatient Treatment Team: Treatment Team: Attending Provider: Florencia Reasons, MD; Rounding Team: Ian Bushman, MD; Consulting Physician: Manus Gunning, MD; Consulting Physician: Nolon Nations, MD    This patient is a 82 y.o.female who presents today for surgical evaluation at the request of Dr Robet Leu.   Reason for evaluation: Cholangitis due to common bile duct stone and gallstones.  Eventual need for cholecystectomy.  Elderly woman chronically anticoagulated for atrial fibrillation.  Aortic stenosis, COPD with chronic respiratory failure on 3 L nasal cannula at home.  Mild dementia.  Chronic kidney disease.  Develop epigastric pain.  Some nausea and emesis nonbloody.  Came in the emergency room with fever and elevated liver function tests.  An large common bile duct with probable common bile duct stones.  Admitted with elevated liver function tests and possible pneumonia.  Workup concerning for common bile duct stone impacted.  Most likely etiology of the cholangitis.  Admitted.  IV antibiotics.  Gastroenterology has been consulted.  Anticipating ERCP this admission as soon as oral anticoagulation is worn off.   Surgical consultation requested for expected cholecystectomy after ERCP.  Patient denies any nausea or vomiting.  Wishes to have treatments done as soon as possible.  Wants to eat.   Assessment  Paula Contreras  82 y.o. female       Problem List:  Principal  Problem:   Ascending cholangitis Active Problems:   Hypertension   COPD (chronic obstructive pulmonary disease) (HCC)   Aortic valve disorder   Chronic respiratory failure (HCC)   PAF (paroxysmal atrial fibrillation) (HCC)   CKD (chronic kidney disease), stage III   HCAP (healthcare-associated pneumonia)   Abnormal LFTs   Epigastric pain   Nausea and vomiting   Common bile duct stone   Chronic anticoagulation - Xerelto   Choledocholithiasis with cholangitis.  Plan:  Hold anticoagulation.  Agree with ERCP first to clear out common bile duct.  Most likely on Tuesday, needing an extra day for the [facility] to wear off given her chronic kidney disease and renal dysfunction.   Consider cardiac clearance given her valvular and dysrhythmia issues.  Anticipate interval cholecystectomy laparoscopically this admission after completion of ERCP.  Most likely will be the day after ERCP, Wednesday.  We will have Dr. Excell Seltzer discuss with the patient in next day or so as he would most likely be doing the cholecystectomy at that point.  -VTE prophylaxis- SCDs, etc  -mobilize as tolerated to help recovery    Adin Hector, M.D., F.A.C.S. Gastrointestinal and Minimally Invasive Surgery [facility] [address] [place], [place] [id number] [phone] / Paging      Past Medical History:  Diagnosis Date  . Anemia 02/20/2015  . Aortic stenosis    a. 2D Echo 07/2013: EF 60-65%, no RWMA, grade 1 DD, normal LV filling pressure, moderate AS, mild TR, PASP 44mHg.  . Bradycardia    a. possible bradycardia with HR 44 by physical exam notes at nephrologist office in 03/2014. F/u  event monitor showed NSR PACs average HR 81, bradycardia <1% of readable data, no pauses of 3 seconds or longer..  . Cataract   . Chronic respiratory failure ([place])    a. On home O2 since 2012.  . CKD (chronic kidney disease), stage III   . COPD (chronic obstructive  pulmonary disease) ([place])   . Dementia   . Hypertension   . Osteoporosis   . Other psoriasis and similar disorders    psoriasis vs eczema  . Paroxysmal atrial fibrillation ([place])    a. Dx 07/2013. No attempts per Epic to restore NSR but in NSR 2016.  . Skin cancer     Past Surgical History:  Procedure Laterality Date  . BREAST SURGERY Right 1964   tumor removed  . CATARACT EXTRACTION Bilateral   . HIP SURGERY Right    fracture    Social History   Social History  . Marital status: Unknown    Spouse name: N/A  . Number of children: 0  . Years of education: N/A   Occupational History  . data entry specialist     retired   Social History Main Topics  . Smoking status: Former Smoker    Packs/day: 1.00    Years: 55.00    Types: Cigarettes    Quit date: 09/25/2010  . Smokeless tobacco: Never Used  . Alcohol use No  . Drug use: No  . Sexual activity: Not on file   Other Topics Concern  . Not on file   Social History Narrative  . No narrative on file    Family History  Problem Relation Age of Onset  . Asthma Mother   . Kidney disease Mother   . Lung cancer Father   . Asthma Maternal Grandmother   . Lung cancer Paternal Uncle     father's twin brother  . Diabetes Brother     Current Facility-Administered Medications  Medication Dose Route Frequency Provider Last Rate Last Dose  . ceFEPIme (MAXIPIME) 1 g in dextrose 5 % 50 mL IVPB  1 g Intravenous Q12H Christine E Shade, RPH   1 g at 02/24/16 2344  . iopamidol (ISOVUE-300) 61 % injection           . ipratropium (ATROVENT) nebulizer solution 0.5 mg  0.5 mg Nebulization Q6H PRN Lily Kocher, MD   0.5 mg at 02/25/16 0105  . levalbuterol (XOPENEX) nebulizer solution 0.63 mg  0.63 mg Nebulization Q6H PRN Lily Kocher, MD   0.63 mg at 02/25/16 0104  . levalbuterol (XOPENEX) nebulizer solution 0.63 mg  0.63 mg Nebulization TID Lily Kocher, MD      . ondansetron [facility]) injection 4 mg  4 mg Intravenous Q6H PRN Lily Kocher, MD      . pantoprazole (PROTONIX) EC tablet 40 mg  40 mg Oral Daily Lily Kocher, MD      . sodium chloride 0.9 % injection           . tiotropium (SPIRIVA) inhalation capsule 18 mcg  18 mcg Inhalation Daily Lily Kocher, MD      . vancomycin (VANCOCIN) IVPB 1000 mg/200 mL premix  1,000 mg Intravenous Q24H Christine E Shade, RPH         Allergies  Allergen Reactions  . Codeine Nausea And Vomiting    ROS: Constitutional:  No fevers, chills, sweats.  Some weakness and feeling tired. Eyes:  No vision changes, No discharge HENT:  No sore throats, nasal drainage Lymph: No neck swelling, No bruising easily  Pulmonary:  No cough, productive sputum.  Wears oxygen  CV: No orthopnea, PND  Patient walks slowly with a walker for balance.  No exertional chest/neck/shoulder/arm pain. GI:  No personal nor family history of GI/colon cancer, inflammatory bowel disease, irritable bowel syndrome, allergy such as Celiac Sprue, dietary/dairy problems, colitis, ulcers nor gastritis.  No recent sick contacts/gastroenteritis.  No travel outside the country.  No changes in diet.  Some dysphagia to solid foods.  No history of endoscopy that she can recall.  Renal: No UTIs, No hematuria Genital:  No drainage, bleeding, masses Musculoskeletal: No severe joint pain.  Good ROM major joints Skin:  No sores or lesions.  No rashes Heme/Lymph:  No easy bleeding.  No swollen lymph nodes Neuro: No focal weakness/numbness.  No seizures Psych: No suicidal ideation.  No hallucinations.  Immediate memory is fair.    BP 115/62 (BP Location: Left Arm)   Pulse 69   Temp 98.4 F (36.9 C) (Oral)   Resp 18   Ht 5' 7"  (1.702 m)   Wt 73.9 kg (163 lb)   SpO2 97%   BMI 25.53 kg/m   Physical Exam: General: Pt awake/alert/oriented x4 in no major acute distress.  [place] but awakens in no acute distress.   Eyes: PERRL, normal EOM. Sclera nonicteric Neuro: CN II-XII intact w/o focal sensory/motor deficits. Lymph: No  head/neck/groin lymphadenopathy Psych:  No delerium/psychosis/paranoia HENT: Normocephalic, Mucus membranes dry.  No thrush.  Hard of hearing  Neck: Supple, No tracheal deviation Chest: No pain.  Good respiratory excursion. CV:  Pulses intact.  Regular rhythm Abdomen: Soft, Nondistended.   mild discomfort in epigastric and right upper quadrant region.  No classic Murphy sign.  No guarding.  No peritonitis  Genital no inguinal hernias or lymphadenopathy.   Ext:  SCDs BLE.  No significant edema.  No cyanosis Skin: No petechiae / purpurea.  No major sores Musculoskeletal: No severe joint pain.  Good ROM major joints   Results:   Labs: Results for orders placed or performed during the hospital encounter of 02/24/16 (from the past 48 hour(s))  CBC with Differential/Platelet     Status: Abnormal   Collection Time: 02/24/16 12:24 PM  Result Value Ref Range   WBC 15.0 (H) 4.0 - 10.5 K/uL   RBC 3.43 (L) 3.87 - 5.11 MIL/uL   Hemoglobin 9.1 (L) 12.0 - 15.0 g/dL   HCT 31.2 (L) 36.0 - 46.0 %   MCV 91.0 78.0 - 100.0 fL   MCH 26.5 26.0 - 34.0 pg   MCHC 29.2 (L) 30.0 - 36.0 g/dL   RDW 14.2 11.5 - 15.5 %   Platelets 322 150 - 400 K/uL   Neutrophils Relative % 91 %   Neutro Abs 13.6 (H) 1.7 - 7.7 K/uL   Lymphocytes Relative 2 %   Lymphs Abs 0.3 (L) 0.7 - 4.0 K/uL   Monocytes Relative 7 %   Monocytes Absolute 1.0 0.1 - 1.0 K/uL   Eosinophils Relative 0 %   Eosinophils Absolute 0.0 0.0 - 0.7 K/uL   Basophils Relative 0 %   Basophils Absolute 0.0 0.0 - 0.1 K/uL  Comprehensive metabolic panel     Status: Abnormal   Collection Time: 02/24/16 12:24 PM  Result Value Ref Range   Sodium 142 135 - 145 mmol/L   Potassium 4.3 3.5 - 5.1 mmol/L   Chloride 95 (L) 101 - 111 mmol/L   CO2 39 (H) 22 - 32 mmol/L   Glucose, Bld 125 (H) 65 -  99 mg/dL   BUN 17 6 - 20 mg/dL   Creatinine, Ser 1.05 (H) 0.44 - 1.00 mg/dL   Calcium 9.4 8.9 - 10.3 mg/dL   Total Protein 7.7 6.5 - 8.1 g/dL   Albumin 3.7 3.5 - 5.0  g/dL   AST 768 (H) 15 - 41 U/L   ALT 449 (H) 14 - 54 U/L   Alkaline Phosphatase 134 (H) 38 - 126 U/L   Total Bilirubin 1.8 (H) 0.3 - 1.2 mg/dL   GFR calc non Af Amer 49 (L) >60 mL/min   GFR calc Af Amer 57 (L) >60 mL/min    Comment: (NOTE) The eGFR has been calculated using the CKD EPI equation. This calculation has not been validated in all clinical situations. eGFR's persistently <60 mL/min signify possible Chronic Kidney Disease.    Anion gap 8 5 - 15  I-Stat Troponin, ED (not at [facility])     Status: None   Collection Time: 02/24/16 12:50 PM  Result Value Ref Range   Troponin i, poc 0.01 0.00 - 0.08 ng/mL   Comment 3            Comment: Due to the release kinetics of cTnI, a negative result within the first hours of the onset of symptoms does not rule out myocardial infarction with certainty. If myocardial infarction is still suspected, repeat the test at appropriate intervals.   I-Stat CG4 Lactic Acid, ED     Status: None   Collection Time: 02/24/16 12:54 PM  Result Value Ref Range   Lactic Acid, Venous 1.12 0.5 - 1.9 mmol/L  I-Stat CG4 Lactic Acid, ED     Status: None   Collection Time: 02/24/16  3:25 PM  Result Value Ref Range   Lactic Acid, Venous 0.72 0.5 - 1.9 mmol/L  Urinalysis, Routine w reflex microscopic (not at [facility])     Status: Abnormal   Collection Time: 02/24/16  4:50 PM  Result Value Ref Range   Color, Urine AMBER (A) YELLOW    Comment: BIOCHEMICALS MAY BE AFFECTED BY COLOR   APPearance CLEAR CLEAR   Specific Gravity, Urine 1.017 1.005 - 1.030   pH 7.5 5.0 - 8.0   Glucose, UA NEGATIVE NEGATIVE mg/dL   Hgb urine dipstick NEGATIVE NEGATIVE   Bilirubin Urine SMALL (A) NEGATIVE   Ketones, ur NEGATIVE NEGATIVE mg/dL   Protein, ur 30 (A) NEGATIVE mg/dL   Nitrite NEGATIVE NEGATIVE   Leukocytes, UA NEGATIVE NEGATIVE  Urine microscopic-add on     Status: Abnormal   Collection Time: 02/24/16  4:50 PM  Result Value Ref Range   Squamous Epithelial / LPF 0-5 (A)  NONE SEEN   WBC, UA 0-5 0 - 5 WBC/hpf   RBC / HPF 6-30 0 - 5 RBC/hpf   Bacteria, UA RARE (A) NONE SEEN   Urine-Other MUCOUS PRESENT   Salicylate level     Status: None   Collection Time: 02/24/16  7:01 PM  Result Value Ref Range   Salicylate Lvl <1.0 2.8 - 30.0 mg/dL  Acetaminophen level     Status: None   Collection Time: 02/24/16  7:01 PM  Result Value Ref Range   Acetaminophen (Tylenol), Serum 12 10 - 30 ug/mL    Comment:        THERAPEUTIC CONCENTRATIONS VARY SIGNIFICANTLY. A RANGE OF 10-30 ug/mL MAY BE AN EFFECTIVE CONCENTRATION FOR MANY PATIENTS. HOWEVER, SOME ARE BEST TREATED AT CONCENTRATIONS OUTSIDE THIS RANGE. ACETAMINOPHEN CONCENTRATIONS >150 ug/mL AT 4 HOURS AFTER INGESTION AND >50  ug/mL AT 12 HOURS AFTER INGESTION ARE OFTEN ASSOCIATED WITH TOXIC REACTIONS.   Ethanol     Status: None   Collection Time: 02/24/16  7:01 PM  Result Value Ref Range   Alcohol, Ethyl (B) <5 <5 mg/dL    Comment:        LOWEST DETECTABLE LIMIT FOR SERUM ALCOHOL IS 5 mg/dL FOR MEDICAL PURPOSES ONLY   Protime-INR     Status: Abnormal   Collection Time: 02/24/16  7:01 PM  Result Value Ref Range   Prothrombin Time 19.6 (H) 11.4 - 15.2 seconds   INR 1.64   Hepatic function panel     Status: Abnormal   Collection Time: 02/24/16  7:01 PM  Result Value Ref Range   Total Protein 6.8 6.5 - 8.1 g/dL   Albumin 3.5 3.5 - 5.0 g/dL   AST 601 (H) 15 - 41 U/L   ALT 390 (H) 14 - 54 U/L   Alkaline Phosphatase 141 (H) 38 - 126 U/L   Total Bilirubin 2.6 (H) 0.3 - 1.2 mg/dL   Bilirubin, Direct 1.6 (H) 0.1 - 0.5 mg/dL   Indirect Bilirubin 1.0 (H) 0.3 - 0.9 mg/dL  Lipase, blood     Status: None   Collection Time: 02/24/16  7:01 PM  Result Value Ref Range   Lipase 20 11 - 51 U/L  MRSA PCR Screening     Status: None   Collection Time: 02/24/16  9:25 PM  Result Value Ref Range   MRSA by PCR NEGATIVE NEGATIVE    Comment:        The GeneXpert MRSA Assay (FDA approved for NASAL specimens only),  is one component of a comprehensive MRSA colonization surveillance program. It is not intended to diagnose MRSA infection nor to guide or monitor treatment for MRSA infections.   CBC WITH DIFFERENTIAL     Status: Abnormal   Collection Time: 02/25/16  5:18 AM  Result Value Ref Range   WBC 11.3 (H) 4.0 - 10.5 K/uL   RBC 3.25 (L) 3.87 - 5.11 MIL/uL   Hemoglobin 8.5 (L) 12.0 - 15.0 g/dL   HCT 29.4 (L) 36.0 - 46.0 %   MCV 90.5 78.0 - 100.0 fL   MCH 26.2 26.0 - 34.0 pg   MCHC 28.9 (L) 30.0 - 36.0 g/dL   RDW 14.4 11.5 - 15.5 %   Platelets 304 150 - 400 K/uL   Neutrophils Relative % 94 %   Neutro Abs 10.6 (H) 1.7 - 7.7 K/uL   Lymphocytes Relative 4 %   Lymphs Abs 0.4 (L) 0.7 - 4.0 K/uL   Monocytes Relative 2 %   Monocytes Absolute 0.2 0.1 - 1.0 K/uL   Eosinophils Relative 0 %   Eosinophils Absolute 0.0 0.0 - 0.7 K/uL   Basophils Relative 0 %   Basophils Absolute 0.0 0.0 - 0.1 K/uL  Comprehensive metabolic panel     Status: Abnormal   Collection Time: 02/25/16  5:18 AM  Result Value Ref Range   Sodium 142 135 - 145 mmol/L   Potassium 4.5 3.5 - 5.1 mmol/L   Chloride 98 (L) 101 - 111 mmol/L   CO2 36 (H) 22 - 32 mmol/L   Glucose, Bld 114 (H) 65 - 99 mg/dL   BUN 18 6 - 20 mg/dL   Creatinine, Ser 1.04 (H) 0.44 - 1.00 mg/dL   Calcium 8.8 (L) 8.9 - 10.3 mg/dL   Total Protein 6.9 6.5 - 8.1 g/dL   Albumin 3.2 (L) 3.5 - 5.0  g/dL   AST 434 (H) 15 - 41 U/L   ALT 364 (H) 14 - 54 U/L   Alkaline Phosphatase 158 (H) 38 - 126 U/L   Total Bilirubin 3.2 (H) 0.3 - 1.2 mg/dL   GFR calc non Af Amer 49 (L) >60 mL/min   GFR calc Af Amer 57 (L) >60 mL/min    Comment: (NOTE) The eGFR has been calculated using the CKD EPI equation. This calculation has not been validated in all clinical situations. eGFR's persistently <60 mL/min signify possible Chronic Kidney Disease.    Anion gap 8 5 - 15    Imaging / Studies: Dg Chest 2 View  Result Date: 02/24/2016 CLINICAL DATA:  Shortness of Breath  EXAM: CHEST  2 VIEW COMPARISON:  02/16/2016 FINDINGS: Cardiac shadow is stable. The lungs are well aerated bilaterally with diffuse interstitial changes stable from the prior study. No focal infiltrate is seen. Minimal left effusion is noted. Multiple compression deformities are noted within the thoracic spine. Aortic calcifications are noted. IMPRESSION: Small left-sided pleural effusion.  No focal infiltrate is seen. Electronically Signed   By: Inez Catalina M.D.   On: 02/24/2016 13:07   Ct Abdomen Pelvis W Contrast  Result Date: 02/25/2016 CLINICAL DATA:  82 y/o  F; general weakness and mild fever. EXAM: CT ABDOMEN AND PELVIS WITH CONTRAST TECHNIQUE: Multidetector CT imaging of the abdomen and pelvis was performed using the standard protocol following bolus administration of intravenous contrast. CONTRAST:  170m ISOVUE-300 IOPAMIDOL (ISOVUE-300) INJECTION 61% COMPARISON:  02/24/2016 abdominal ultrasound. 02/24/2016 chest radiograph. 04/05/2014 lumbar spine radiograph. FINDINGS: Lower chest: Small left pleural effusion. Moderate hiatal hernia. Coronary artery calcifications. Hepatobiliary: No focal liver abnormality. Mild distention of the bladder and common bile duct measuring up to 12 mm. 2 mm gallstone within the lower common bile duct (series 5, image 47). Pancreas: Unremarkable. No pancreatic ductal dilatation or surrounding inflammatory changes. Spleen: Normal in size without focal abnormality. Adrenals/Urinary Tract: Adrenal glands are unremarkable. Kidneys are normal, without renal calculi, focal lesion, or hydronephrosis. Bladder is unremarkable. Stomach/Bowel: Stomach is within normal limits. Appendix appears normal. No evidence of bowel wall thickening, distention, or inflammatory changes. Vascular/Lymphatic: Aortic atherosclerosis. No enlarged abdominal or pelvic lymph nodes. 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 Reproductive: Uterus is unremarkable. 6.8 cm right  adnexal cystic lesion. No appreciable enhancing nodular components. Other: No abdominal wall hernia or abnormality. No abdominopelvic ascites. Musculoskeletal: T10 and L5 vertebral body compression deformities and L4 inferior endplate compression deformity stable in comparison with prior lumbar and chest radiographs. Moderate L1 compression deformity is Paula from prior radiographs. IMPRESSION: 1. Mild dilatation of common bile duct with 2 mm choledocholithiasis in the lower duct within head of pancreas. Given elevated liver function tests obstruction is suspected. No pancreatic ductal dilatation. 2. Moderate L1 compression deformity Paula from prior radiographs, age indeterminate. 3. Small left pleural effusion. 4. Moderate hiatal hernia. 5. Coronary artery calcification. 6. 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 7. **An incidental finding of potential clinical significance has been found. 6.8 cm right adnexal cystic lesion. Further evaluation with pelvic ultrasound is recommended on a nonemergent basis. This recommendation follows ACR consensus guidelines: White Paper of the ACR Incidental Findings Committee II on Adnexal Findings. J Am Coll Radiol [phone]** These results will be called to the ordering clinician or representative by the Radiologist Assistant, and communication documented in the PACS or zVision Dashboard. Electronically Signed   By: LMia Creek  Furusawa-Stratton M.D.   On: 02/25/2016 04:13   Dg Chest Portable 1 View  Result Date: 02/16/2016 CLINICAL DATA:  Left-sided chest pain, bloody sputum with cough. EXAM: PORTABLE CHEST 1 VIEW COMPARISON:  Exams dating back through 12/27/2014 FINDINGS: There is mild diffuse interstitial edema with small left effusion. Atelectatic appearing left lung with volume loss and mediastinal as well as cardiac shift to left with compensatory hyperinflation of the right lung. A double density over the cardiac silhouette cannot  exclude a hiatal hernia. Heart is normal in size. There is aortic atherosclerosis. IMPRESSION: Volume loss with shift of the mediastinum and heart to the left. Interstitial pulmonary edema with probable small left pleural effusion. A double density over the cardiac silhouette raises the possibility of a hiatal hernia although atelectatic lung might also contribute to this appearance. Electronically Signed   By: Ashley Royalty M.D.   On: 02/16/2016 19:38   US Abdomen Limited Ruq  Result Date: 02/24/2016 CLINICAL DATA:  Postprandial right upper quadrant and upper abdominal pain beginning on 02/23/2016. History of aortic stenosis, chronic kidney disease, and hypertension. EXAM: US ABDOMEN LIMITED - RIGHT UPPER QUADRANT COMPARISON:  None. FINDINGS: Gallbladder: Limited visualization. No gallstones or wall thickening visualized. No sonographic Murphy sign noted by sonographer. Common bile duct: Diameter: Ranges from 3.8 to 8.4 mm. No filling defects demonstrated. Liver: Limited visualization. No focal lesion identified. Liver parenchymal echotexture appears diffusely decreased. Examination is technically limited, likely due to combination of patient body habitus and labored breathing. IMPRESSION: Technically limited examination with poor penetration of the liver and gallbladder but no Loxley Schmale evidence of cholelithiasis or cholecystitis. Distal bile ducts are at the upper limits of normal range but no significant bile duct dilatation. Electronically Signed   By: Lucienne Capers M.D.   On: 02/24/2016 23:22    Medications / Allergies: per chart  Antibiotics: Anti-infectives    Start     Dose/Rate Route Frequency Ordered Stop   02/25/16 1400  vancomycin (VANCOCIN) IVPB 1000 mg/200 mL premix     1,000 mg 200 mL/hr over 60 Minutes Intravenous Every 24 hours 02/24/16 2044     02/25/16 0000  ceFEPIme (MAXIPIME) 1 g in dextrose 5 % 50 mL IVPB     1 g 100 mL/hr over 30 Minutes Intravenous Every 12 hours 02/24/16 2044      02/24/16 2200  ceFEPIme (MAXIPIME) 1 g in dextrose 5 % 50 mL IVPB  Status:  Discontinued     1 g 100 mL/hr over 30 Minutes Intravenous Every 8 hours 02/24/16 2018 02/24/16 2044   02/24/16 1330  vancomycin (VANCOCIN) IVPB 1000 mg/200 mL premix     1,000 mg 200 mL/hr over 60 Minutes Intravenous STAT 02/24/16 1324 02/24/16 1646   02/24/16 1315  ceFEPIme (MAXIPIME) 1 g in dextrose 5 % 50 mL IVPB     1 g 100 mL/hr over 30 Minutes Intravenous STAT 02/24/16 1314 02/24/16 1438        Note: Portions of this report may have been transcribed using voice recognition software. Every effort was made to ensure accuracy; however, inadvertent computerized transcription errors may be present.   Any transcriptional errors that result from this process are unintentional.

## 2016-02-25 NOTE — Consult Note (Signed)
[facility] Consult: 8:16 AM 02/25/2016  LOS: 1 day    Referring Provider: Dr Erlinda Hong  Primary Care Physician:  Vena Austria, MD Primary Gastroenterologist:  Dr. Henrene Pastor     Reason for Consultation:  Cholodocholithiasis. Cholangitis     HPI: Paula Contreras is a 82 y.o. female.  PMH A  Fib, CHADS vasc 4, on chronic Xarelto (last dose was 11/24 in PM.  COPD, chronic respiratory failure on chronic 3 L [place]. CK D3. Dementia. Lives at a nursing home versus assisted living facility.. Seen by Dr. Scarlette Shorts at GI office once on 01/17/16 for minor rectal bleeding.  Patient would not consent to rectal exam. He suspected bleeding from hemorrhoids or fissure following passage of hard stool though was unable to rule out neoplasia. He did not feel that patient was appropriate for endoscopic evaluation given overwhelmingly high risk status from both cardio and pulmonary perspective. He recommended local rectal suppository therapy.  She was admitted last night from her residence with weakness, sweats and chills but no documented fever until last night. Dry cough with some hemoptysis. After dinner last night she developed epigastric pain. She had at least a couple of episodes of nonbloody emesis. Lightheaded but no LOC. Rectal temp 101.6 in the ED. WBCs 15 K. Lactic acid levels normal. Small left pleural effusion on chest x-ray. Initially there was some concern for healthcare acquired pneumonia and antibiotic with vancomycin and cefepime initiated. However LFTs were markedly abnormal. CBD diameter 3.8-8.4 mm without filling defects.  Decreased liver echotexture. However study limited due to patient's body habitus and labored breathing. CT abdomen pelvis with contrast showed CBD of up to 12 mm with 2 mm stone in the lower CBD.   Other findings included compression deformities in the lumbar spine, small left pleural effusion, moderate hiatal hernia, coronary artery calcification along with atherosclerosis of the aorta and stenosis of celiac axis origin, 6.8 cm right adnexal cystic lesion with suggestion to pursue further evaluation with nonemergent pelvic ultrasound.  Overnight her total bilirubin has gone from 2.6 to 3.2. Alkaline phosphatase 141 to 158. AST has gone from 601 to 434, ALT has gone from [phone]. WBCs have declined from 15-11.3. She is anemic with hemoglobin of 8.5 but MCV normal. Platelets in the 300s. Pro-time 19.6, INR 1.6.  Unfortunately the patient was given her Xarelto dose last night.   Past Medical History:  Diagnosis Date  . Anemia 02/20/2015  . Aortic stenosis    a. 2D Echo 07/2013: EF 60-65%, no RWMA, grade 1 DD, normal LV filling pressure, moderate AS, mild TR, PASP 60mHg.  . Bradycardia    a. possible bradycardia with HR 44 by physical exam notes at nephrologist office in 03/2014. F/u event monitor showed NSR PACs average HR 81, bradycardia <1% of readable data, no pauses of 3 seconds or longer..  . Cataract   . Chronic respiratory failure ([place]    a. On home O2 since 2012.  . CKD (chronic kidney disease), stage III   . COPD (chronic obstructive pulmonary disease) ([place]   .  Dementia   . Hypertension   . Osteoporosis   . Other psoriasis and similar disorders    psoriasis vs eczema  . Paroxysmal atrial fibrillation ([place])    a. Dx 07/2013. No attempts per Epic to restore NSR but in NSR 2016.  . Skin cancer     Past Surgical History:  Procedure Laterality Date  . BREAST SURGERY Right 1964   tumor removed  . CATARACT EXTRACTION Bilateral   . HIP SURGERY Right    fracture    Prior to Admission medications   Medication Sig Start Date End Date Taking? Authorizing Provider  acetaminophen (TYLENOL) 325 MG tablet Take 650 mg by mouth every 6 (six) hours as needed (for pain).    Yes  Historical Provider, MD  albuterol (PROAIR HFA) 108 (90 BASE) MCG/ACT inhaler Inhale 2 puffs into the lungs every 4 (four) hours as needed for wheezing or shortness of breath. 12/24/14  Yes Collene Gobble, MD  alendronate (FOSAMAX) 70 MG tablet Take 70 mg by mouth every Monday. Take with a full glass of water on an empty stomach.   Yes Historical Provider, MD  calcium carbonate (OS-CAL) 600 MG TABS tablet Take 2 tablets by mouth daily with breakfast.    Yes Historical Provider, MD  Cholecalciferol (VITAMIN D) 2000 UNITS CAPS Take 2,000 Units by mouth daily with breakfast.    Yes Historical Provider, MD  clobetasol (TEMOVATE) 0.05 % external solution apply 4 drops to scalp one daily Monday- Thursday for psoriasis   Yes Historical Provider, MD  esomeprazole (NEXIUM) 20 MG capsule Take 20 mg by mouth daily at 12 noon.   Yes Historical Provider, MD  hydrocortisone 2.5 % cream Apply 1 application topically See admin instructions. Apply to Affected area topically to ears twice a day on Monday Tuesday Wednesday and Thursday   Yes Historical Provider, MD  ipratropium (ATROVENT) 0.02 % nebulizer solution Take 2.5 mLs (0.5 mg total) by nebulization every 2 (two) hours as needed for wheezing or shortness of breath. Patient taking differently: Take 0.5 mg by nebulization every 6 (six) hours as needed for wheezing or shortness of breath.  02/23/15  Yes Theodis Blaze, MD  ketoconazole (NIZORAL) 2 % shampoo Apply 1 application topically as directed.   Yes Historical Provider, MD  levalbuterol (XOPENEX) 0.63 MG/3ML nebulizer solution Take 3 mLs (0.63 mg total) by nebulization every 2 (two) hours as needed for wheezing or shortness of breath. Patient taking differently: Take 0.63 mg by nebulization every 6 (six) hours as needed for wheezing or shortness of breath.  02/23/15  Yes Theodis Blaze, MD  Multiple Vitamins-Iron (MULTIVITAMIN/IRON PO) Take 1 tablet by mouth daily.   Yes Historical Provider, MD  OXYGEN Inhale 2-3  L into the lungs continuous. Change to 3L when on portable   Yes Historical Provider, MD  rivaroxaban (XARELTO) 20 MG TABS tablet Take 20 mg by mouth daily.    Yes Historical Provider, MD  senna-docusate (SENOKOT-S) 8.6-50 MG tablet Take 1 tablet by mouth at bedtime as needed for mild constipation. 02/23/15  Yes Theodis Blaze, MD  Tiotropium Bromide Monohydrate (SPIRIVA RESPIMAT) 2.5 MCG/ACT AERS Inhale 2 puffs into the lungs daily.   Yes Historical Provider, MD    Scheduled Meds: . ceFEPime (MAXIPIME) IV  1 g Intravenous Q12H  . iopamidol      . levalbuterol  0.63 mg Nebulization TID  . pantoprazole  40 mg Oral Daily  . sodium chloride      .  tiotropium  18 mcg Inhalation Daily  . vancomycin  1,000 mg Intravenous Q24H   Infusions:  PRN Meds: ipratropium, levalbuterol, ondansetron (ZOFRAN) IV   Allergies as of 02/24/2016 - Review Complete 02/24/2016  Allergen Reaction Noted  . Codeine Nausea And Vomiting 10/02/2010    Family History  Problem Relation Age of Onset  . Asthma Mother   . Kidney disease Mother   . Lung cancer Father   . Asthma Maternal Grandmother   . Lung cancer Paternal Uncle     father's twin brother  . Diabetes Brother     Social History   Social History  . Marital status: Unknown    Spouse name: N/A  . Number of children: 0  . Years of education: N/A   Occupational History  . data entry specialist     retired   Social History Main Topics  . Smoking status: Former Smoker    Packs/day: 1.00    Years: 55.00    Types: Cigarettes    Quit date: 09/25/2010  . Smokeless tobacco: Never Used  . Alcohol use No  . Drug use: No  . Sexual activity: Not on file   Other Topics Concern  . Not on file   Social History Narrative  . No narrative on file    REVIEW OF SYSTEMS: Constitutional:  Malaise, weakness.  Normally patient able to ambulate with a walker. ENT:  No nose bleeds Pulm:  Dry cough. Chronic oxygen. No worsening of dyspnea. CV:  No  palpitations, no LE edema.  GU:  No hematuria, no frequency GI:  Nothing in the records to show that she's ever had a colonoscopy or upper endoscopy, she is unable to provide any history in this regard.  Minor rectal bleeding on a few occasions leading up to the visit in October with Dr. Henrene Pastor.  She can only eat small bites of food because it is difficult for her to chew and swallow larger objects. Heme:  No unusual bleeding or bruising.   Transfusions:  No records of transfusion in Epic and patient unable to recall any previous transfusions. Neuro:  No headaches, no peripheral tingling or numbness Derm:  No itching, no rash or sores.  Endocrine:  No sweats or chills.  No polyuria or dysuria Immunization:  Patient had her flu shot in August 2017 and is up-to-date on several other vaccinations which were reviewed. Travel:  None beyond local counties in last few months.    PHYSICAL EXAM: Vital signs in last 24 hours: Vitals:   02/24/16 2132 02/25/16 0510  BP: 122/65 115/62  Pulse: 84 69  Resp: 18 18  Temp: 98.6 F (37 C) 98.4 F (36.9 C)   Wt Readings from Last 3 Encounters:  02/24/16 73.9 kg (163 lb)  01/04/16 72.2 kg (159 lb 2 oz)  11/29/15 70.8 kg (156 lb)    General: Pale, ill appearing she is hard of hearing and thus a bit hard to arouse. Head:  No asymmetry or swelling.  Eyes:  No scleral icterus. Conjunctiva is pale. Ears:  Hard of hearing.  Nose:  No congestion, no discharge and no obvious dried blood in the nose. Mouth:  Somewhat dry oral mucosa. Upper dentures. Most of the molars on the lower jaw are absent. Neck:  No mass. No JVD. No thyromegaly. Lungs:  Reduced but clear bilaterally. No cough. Nasal cannula oxygen in place, she has a bit dyspneic with speech.  Heart: RRR. S1, S2 present. Harsh 2 to 3/6 systolic murmur.  Abdomen:   soft. Not distended. No masses or HSM. No hernias. She is tender in the epigastric, right upper quadrant region. No guarding. No rebound..     RecDeferred   Musc/Skeletal :  Kyphosis, no erythema or gross deformities Extremities:  No CCE.  Neurologic:   she is oriented 2017, hospital and self. Moves all 4 limbs. No tremor. Limb strength not tested. Skin:   no sores, rashes or telangiectasia. Tattoos:   none observed. Nodes:   no cervical adenopathy.   Psych:   calm. Cooperative.  Intake/Output from previous day: 11/24 0701 - 11/25 0700 In: 300 [IV Piggyback:300] Out: -  Intake/Output this shift: No intake/output data recorded.  LAB RESULTS:  Recent Labs  02/24/16 1224 02/25/16 0518  WBC 15.0* 11.3*  HGB 9.1* 8.5*  HCT 31.2* 29.4*  PLT 322 304   BMET Lab Results  Component Value Date   NA 142 02/25/2016   NA 142 02/24/2016   NA 140 02/16/2016   K 4.5 02/25/2016   K 4.3 02/24/2016   K 4.4 02/16/2016   CL 98 (L) 02/25/2016   CL 95 (L) 02/24/2016   CL 97 (L) 02/16/2016   CO2 36 (H) 02/25/2016   CO2 39 (H) 02/24/2016   CO2 37 (H) 02/16/2016   GLUCOSE 114 (H) 02/25/2016   GLUCOSE 125 (H) 02/24/2016   GLUCOSE 128 (H) 02/16/2016   BUN 18 02/25/2016   BUN 17 02/24/2016   BUN 23 (H) 02/16/2016   CREATININE 1.04 (H) 02/25/2016   CREATININE 1.05 (H) 02/24/2016   CREATININE 1.30 (H) 02/16/2016   CALCIUM 8.8 (L) 02/25/2016   CALCIUM 9.4 02/24/2016   CALCIUM 9.7 02/16/2016   LFT  Recent Labs  02/24/16 1224 02/24/16 1901 02/25/16 0518  PROT 7.7 6.8 6.9  ALBUMIN 3.7 3.5 3.2*  AST 768* 601* 434*  ALT 449* 390* 364*  ALKPHOS 134* 141* 158*  BILITOT 1.8* 2.6* 3.2*  BILIDIR  --  1.6*  --   IBILI  --  1.0*  --    PT/INR Lab Results  Component Value Date   INR 1.64 02/24/2016   Hepatitis Panel No results for input(s): HEPBSAG, HCVAB, HEPAIGM, HEPBIGM in the last 72 hours. C-Diff No components found for: CDIFF Lipase     Component Value Date/Time   LIPASE 20 02/24/2016 1901    Drugs of Abuse  No results found for: LABOPIA, COCAINSCRNUR, LABBENZ, AMPHETMU, THCU, LABBARB   RADIOLOGY  STUDIES: Dg Chest 2 View  Result Date: 02/24/2016 CLINICAL DATA:  Shortness of Breath EXAM: CHEST  2 VIEW COMPARISON:  02/16/2016 FINDINGS: Cardiac shadow is stable. The lungs are well aerated bilaterally with diffuse interstitial changes stable from the prior study. No focal infiltrate is seen. Minimal left effusion is noted. Multiple compression deformities are noted within the thoracic spine. Aortic calcifications are noted. IMPRESSION: Small left-sided pleural effusion.  No focal infiltrate is seen. Electronically Signed   By: Inez Catalina M.D.   On: 02/24/2016 13:07   Ct Abdomen Pelvis W Contrast  Result Date: 02/25/2016 CLINICAL DATA:  82 y/o  F; general weakness and mild fever. EXAM: CT ABDOMEN AND PELVIS WITH CONTRAST TECHNIQUE: Multidetector CT imaging of the abdomen and pelvis was performed using the standard protocol following bolus administration of intravenous contrast. CONTRAST:  11m ISOVUE-300 IOPAMIDOL (ISOVUE-300) INJECTION 61% COMPARISON:  02/24/2016 abdominal ultrasound. 02/24/2016 chest radiograph. 04/05/2014 lumbar spine radiograph. FINDINGS: Lower chest: Small left pleural effusion. Moderate hiatal hernia. Coronary artery calcifications. Hepatobiliary: No focal liver abnormality.  Mild distention of the bladder and common bile duct measuring up to 12 mm. 2 mm gallstone within the lower common bile duct (series 5, image 47). Pancreas: Unremarkable. No pancreatic ductal dilatation or surrounding inflammatory changes. Spleen: Normal in size without focal abnormality. Adrenals/Urinary Tract: Adrenal glands are unremarkable. Kidneys are normal, without renal calculi, focal lesion, or hydronephrosis. Bladder is unremarkable. Stomach/Bowel: Stomach is within normal limits. Appendix appears normal. No evidence of bowel wall thickening, distention, or inflammatory changes. Vascular/Lymphatic: Aortic atherosclerosis. No enlarged abdominal or pelvic lymph nodes. Moderate stenosis of the origin  of celiac axis (series 5, image 62). Reproductive: Uterus is unremarkable. 6.8 cm right adnexal cystic lesion. No appreciable enhancing nodular components. Other: No abdominal wall hernia or abnormality. No abdominopelvic ascites. Musculoskeletal: T10 and L5 vertebral body compression deformities and L4 inferior endplate compression deformity stable in comparison with prior lumbar and chest radiographs. Moderate L1 compression deformity is new from prior radiographs. IMPRESSION: 1. Mild dilatation of common bile duct with 2 mm choledocholithiasis in the lower duct within head of pancreas. Given elevated liver function tests obstruction is suspected. No pancreatic ductal dilatation. 2. Moderate L1 compression deformity new from prior radiographs, age indeterminate. 3. Small left pleural effusion. 4. Moderate hiatal hernia. 5. Coronary artery calcification. 6. 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 7. **An incidental finding of potential clinical significance has been found. 6.8 cm right adnexal cystic lesion. Further evaluation with pelvic ultrasound is recommended on a nonemergent basis. This recommendation follows ACR consensus guidelines: White Paper of the ACR Incidental Findings Committee II on Adnexal Findings. J Am Coll Radiol [phone].** These results will be called to the ordering clinician or representative by the Radiologist Assistant, and communication documented in the PACS or zVision Dashboard. Electronically Signed   By: Kristine Garbe M.D.   On: 02/25/2016 04:13   US Abdomen Limited Ruq  Result Date: 02/24/2016 CLINICAL DATA:  Postprandial right upper quadrant and upper abdominal pain beginning on 02/23/2016. History of aortic stenosis, chronic kidney disease, and hypertension. EXAM: US ABDOMEN LIMITED - RIGHT UPPER QUADRANT COMPARISON:  None. FINDINGS: Gallbladder: Limited visualization. No gallstones or wall thickening visualized. No sonographic  Murphy sign noted by sonographer. Common bile duct: Diameter: Ranges from 3.8 to 8.4 mm. No filling defects demonstrated. Liver: Limited visualization. No focal lesion identified. Liver parenchymal echotexture appears diffusely decreased. Examination is technically limited, likely due to combination of patient body habitus and labored breathing. IMPRESSION: Technically limited examination with poor penetration of the liver and gallbladder but no gross evidence of cholelithiasis or cholecystitis. Distal bile ducts are at the upper limits of normal range but no significant bile duct dilatation. Electronically Signed   By: Lucienne Capers M.D.   On: 02/24/2016 23:22    ENDOSCOPIC STUDIES: None found in Epic and none mentioned in record.  IMPRESSION:   *  Choledocholithiasis, probable cholangitis. Clinically and lab wise improved overnight with antibiotics.  *  A. fib, chronic surrounding toe. Imaging reveals coronary artery and iliac calcification as well as celiac origin stenosis. Last dose of Xarelto was last night and now on hold. .  *  Normocytic anemia.  Hemoglobin is low at 7.1 in 10/2015. Baseline looks to be around 9.  *  History of minor rectal bleeding. Given patient's oxygen acquiring COPD, A fib  Dr. Henrene Pastor did not feel colonoscopic workup was wise unless she developed overwhelming hemorrhage. No: Masses or diverticulosis seen on CT scan last night.  *  Right  adnexal cyst per CT scan.     PLAN:     *  Continue antibiotics. Allow clear diet. Plan for ERCP, probably on Monday 11/27 After washout of Xarelto.  Orders enterred into scheduling depot.   Azucena Freed  02/25/2016, 8:16 AM Pager: [phone]

## 2016-02-25 NOTE — Clinical Social Work Note (Signed)
Clinical Social Work Assessment  Patient Details  Name: Paula Contreras MRN: [id number] Date of Birth: 1933/05/07  Date of referral:  02/25/16               Reason for consult:  Discharge Planning                Permission sought to share information with:  Family Supports, Customer service manager, Case Optician, dispensing granted to share information::  Yes, Verbal Permission Granted  Name::      [name] )  Agency::   ([facility] )  Relationship::   (Niece )  Contact Information:   [phone])  Housing/Transportation Living arrangements for the past 2 months:  [place] of Information:  Patient Patient Interpreter Needed:  None Criminal Activity/Legal Involvement Pertinent to Current Situation/Hospitalization:  No - Comment as needed Significant Relationships:  Other Family Members Lives with:  Facility Resident Do you feel safe going back to the place where you live?  Yes Need for family participation in patient care:  No (Coment)  Care giving concerns:  Patient admitted from [facility] Worker assessment / plan:  MSW met with patient and niece present at bedside and in reference to patient's return to ALF. MSW introduced MSW role. Patient and family agreeable to pt's return to ALF once medically stable. No further concerns reported at this time. MSW remains available as needed.   Employment status:  Retired Nurse, adult PT Recommendations:  Not assessed at this time Information / Referral to community resources:   (ALF )  Patient/Family's Response to care:  Pt a/o x4. Patient aggressive during assessment. Pt's niece supportive and appreciated social work intervention.   Patient/Family's Understanding of and Emotional Response to Diagnosis, Current Treatment, and Prognosis:  Unsure of patient's understanding of medical condition.   Emotional Assessment Appearance:   Appears stated age Attitude/Demeanor/Rapport:  Aggressive (Verbally and/or physically) Affect (typically observed):  Accepting Orientation:  Oriented to Self, Oriented to Place, Oriented to  Time, Oriented to Situation Alcohol / Substance use:  Not Applicable Psych involvement (Current and /or in the community):  No (Comment)  Discharge Needs  Concerns to be addressed:  Denies Needs/Concerns at this time Readmission within the last 30 days:  No Current discharge risk:  None Barriers to Discharge:  Continued Medical Work up   [employer], MSW [phone] 02/25/2016 2:09 PM

## 2016-02-26 LAB — COMPREHENSIVE METABOLIC PANEL
ALBUMIN: 2.9 g/dL — AB (ref 3.5–5.0)
ALT: 268 U/L — AB (ref 14–54)
ANION GAP: 5 (ref 5–15)
AST: 190 U/L — ABNORMAL HIGH (ref 15–41)
Alkaline Phosphatase: 148 U/L — ABNORMAL HIGH (ref 38–126)
BUN: 29 mg/dL — AB (ref 6–20)
CALCIUM: 8.6 mg/dL — AB (ref 8.9–10.3)
CHLORIDE: 97 mmol/L — AB (ref 101–111)
CO2: 38 mmol/L — ABNORMAL HIGH (ref 22–32)
Creatinine, Ser: 1.2 mg/dL — ABNORMAL HIGH (ref 0.44–1.00)
GFR calc non Af Amer: 42 mL/min — ABNORMAL LOW (ref >60)
GFR, EST AFRICAN AMERICAN: 48 mL/min — AB (ref >60)
Glucose, Bld: 128 mg/dL — ABNORMAL HIGH (ref 65–99)
Potassium: 4.3 mmol/L (ref 3.5–5.1)
SODIUM: 140 mmol/L (ref 135–145)
TOTAL PROTEIN: 6.6 g/dL (ref 6.5–8.1)
Total Bilirubin: 0.9 mg/dL (ref 0.3–1.2)

## 2016-02-26 LAB — CBC
HCT: 26.6 % — ABNORMAL LOW (ref 36.0–46.0)
Hemoglobin: 7.9 g/dL — ABNORMAL LOW (ref 12.0–15.0)
MCH: 26.4 pg (ref 26.0–34.0)
MCHC: 29.7 g/dL — ABNORMAL LOW (ref 30.0–36.0)
MCV: 89 fL (ref 78.0–100.0)
PLATELETS: 308 10*3/uL (ref 150–400)
RBC: 2.99 MIL/uL — AB (ref 3.87–5.11)
RDW: 14.6 % (ref 11.5–15.5)
WBC: 10.4 10*3/uL (ref 4.0–10.5)

## 2016-02-26 LAB — OCCULT BLOOD X 1 CARD TO LAB, STOOL: FECAL OCCULT BLD: POSITIVE — AB

## 2016-02-26 LAB — HEPATITIS PANEL, ACUTE
HEP A IGM: NEGATIVE
Hep B C IgM: NEGATIVE
Hepatitis B Surface Ag: NEGATIVE

## 2016-02-26 LAB — HEPARIN LEVEL (UNFRACTIONATED): HEPARIN UNFRACTIONATED: 1.74 [IU]/mL — AB (ref 0.30–0.70)

## 2016-02-26 LAB — APTT
aPTT: 156 seconds — ABNORMAL HIGH (ref 24–36)
aPTT: 111 seconds — ABNORMAL HIGH (ref 24–36)

## 2016-02-26 LAB — HIV ANTIBODY (ROUTINE TESTING W REFLEX): HIV SCREEN 4TH GENERATION: NONREACTIVE

## 2016-02-26 MED ORDER — SODIUM CHLORIDE 0.9 % IV SOLN: INTRAVENOUS | Status: DC

## 2016-02-26 MED ORDER — CHLORHEXIDINE GLUCONATE CLOTH 2 % EX PADS: 6.0000 | MEDICATED_PAD | Freq: Once | CUTANEOUS | Status: DC

## 2016-02-26 MED ORDER — SODIUM CHLORIDE 0.9 % IV SOLN
INTRAVENOUS | Status: DC
  Administered 2016-02-26 – 2016-02-28 (×3): via INTRAVENOUS
  Administered 2016-02-28: 1000 mL via INTRAVENOUS
  Administered 2016-03-01: via INTRAVENOUS

## 2016-02-26 MED ORDER — BOOST PLUS PO LIQD
237.0000 mL | Freq: Two times a day (BID) | ORAL | Status: DC
  Administered 2016-03-02: 237 mL via ORAL
  Filled 2016-02-26 (×11): qty 237

## 2016-02-26 MED ORDER — GABAPENTIN 300 MG PO CAPS
300.0000 mg | ORAL_CAPSULE | ORAL | Status: AC
  Administered 2016-02-27: 300 mg via ORAL
  Filled 2016-02-26: qty 1

## 2016-02-26 MED ORDER — CEFAZOLIN SODIUM-DEXTROSE 2-4 GM/100ML-% IV SOLN
2.0000 g | INTRAVENOUS | Status: AC
  Filled 2016-02-26: qty 100

## 2016-02-26 MED ORDER — ACETAMINOPHEN 500 MG PO TABS
1000.0000 mg | ORAL_TABLET | ORAL | Status: AC
  Administered 2016-02-27: 1000 mg via ORAL
  Filled 2016-02-26: qty 2

## 2016-02-26 MED ORDER — HEPARIN (PORCINE) IN NACL 100-0.45 UNIT/ML-% IJ SOLN
800.0000 [IU]/h | INTRAMUSCULAR | Status: DC
  Administered 2016-02-27 – 2016-02-28 (×2): 800 [IU]/h via INTRAVENOUS
  Filled 2016-02-26 (×4): qty 250

## 2016-02-26 NOTE — Progress Notes (Addendum)
ANTICOAGULATION CONSULT NOTE - Follow-Up Consult  Pharmacy Consult for Heparin Indication: atrial fibrillation-->bridge while off xarelto  Allergies  Allergen Reactions  . Codeine Nausea And Vomiting    Patient Measurements: Height: '5\' 7"'$  (170.2 cm) Weight: 163 lb (73.9 kg) IBW/kg (Calculated) : 61.6   Vital Signs: Temp: 99.1 F (37.3 C) (11/26 1525) Temp Source: Oral (11/26 1525) BP: 126/52 (11/26 1525) Pulse Rate: 69 (11/26 1525)  Labs:  Recent Labs  02/24/16 1224 02/24/16 1901 02/25/16 0518 02/25/16 1403 02/26/16 0450 02/26/16 1500  HGB 9.1*  --  8.5*  --  7.9*  --   HCT 31.2*  --  29.4*  --  26.6*  --   PLT 322  --  304  --  308  --   APTT  --   --   --  57* 156* 111*  LABPROT  --  19.6*  --   --   --   --   INR  --  1.64  --   --   --   --   HEPARINUNFRC  --   --   --  2.01* 1.74*  --   CREATININE 1.05*  --  1.04*  --  1.20*  --     Estimated Creatinine Clearance: 36.4 mL/min (by C-G formula based on SCr of 1.2 mg/dL (H)).   Medical History: Past Medical History:  Diagnosis Date  . Anemia 02/20/2015  . Aortic stenosis    a. 2D Echo 07/2013: EF 60-65%, no RWMA, grade 1 DD, normal LV filling pressure, moderate AS, mild TR, PASP 35mHg.  . Bradycardia    a. possible bradycardia with HR 44 by physical exam notes at nephrologist office in 03/2014. F/u event monitor showed NSR PACs average HR 81, bradycardia <1% of readable data, no pauses of 3 seconds or longer..  . Cataract   . Chronic respiratory failure ([place] [place]    a. On home O2 since 2012.  . CKD (chronic kidney disease), stage III   . COPD [place] disease) ([place]   . Dementia   . Hypertension   . Osteoporosis   . Other psoriasis and similar disorders    psoriasis vs eczema  . Paroxysmal atrial fibrillation ([place]    a. Dx 07/2013. No attempts per Epic to restore NSR but in NSR 2016.  . Skin cancer     Assessment: 82yo F admitted with cholangitis and need for possible surgical  procedure by GI (MRCP vs. ERCP). She is chronically on Xarelto for her Afib (CHADS2Vasc=4).  Last dose 11/24 @ 2118.   Baseline heparin level (anti-Xa level) falsely elevated due to effects of Xarelto.  Will use aPTTs to guide heparin therapy until heparin levels correlate with aPTTs.   Today, 02/26/16 -1500 aPTT: 111 seconds, elevated despite heparin infusion rate decreased this AM to 900 units/hr -CBC: Hgb decreased to 7.9 (per GI, no signs of bleeding, likely due to repeated blood draws and fluids). Pltc WNL -No infusion issues reported per nursing -FOBT positive-MD aware and continuing heparin at this time  Goal of Therapy:  Heparin level 0.3-0.7 units/ml aPTT 66-102s seconds Monitor platelets by anticoagulation protocol: Yes   Plan:  Decrease heparin infusion to 800 units/hr Check aPTT, heparin level 8 hours after rate change Daily aPTT, heparin level, CBC F/U plans to hold heparin peri-procedure.   JLindell Spar PharmD, BCPS Pager: [phone]/26/2017 4:45 PM

## 2016-02-26 NOTE — Progress Notes (Signed)
ANTICOAGULATION CONSULT NOTE - Follow Up Consult  Pharmacy Consult for Heparin Indication: atrial fibrillation-->bridge while off xarelto  Allergies  Allergen Reactions  . Codeine Nausea And Vomiting    Patient Measurements: Height: '5\' 7"'$  (170.2 cm) Weight: 163 lb (73.9 kg) IBW/kg (Calculated) : 61.6 Heparin Dosing Weight:   Vital Signs: Temp: 97.9 F (36.6 C) (11/26 0549) Temp Source: Oral (11/26 0549) BP: 121/58 (11/26 0549) Pulse Rate: 57 (11/26 0549)  Labs:  Recent Labs  02/24/16 1224 02/24/16 1901 02/25/16 0518 02/25/16 1403 02/26/16 0450  HGB 9.1*  --  8.5*  --  7.9*  HCT 31.2*  --  29.4*  --  26.6*  PLT 322  --  304  --  308  APTT  --   --   --  57* 156*  LABPROT  --  19.6*  --   --   --   INR  --  1.64  --   --   --   HEPARINUNFRC  --   --   --  2.01* 1.74*  CREATININE 1.05*  --  1.04*  --  1.20*    Estimated Creatinine Clearance: 36.4 mL/min (by C-G formula based on SCr of 1.2 mg/dL (H)).   Medications:  Infusions:  . heparin 1,000 Units/hr (02/25/16 2314)    Assessment: Patient with high heparin level and high PTT.  No heparin issues per RN.  PTT ordered with Heparin level until both correlate due to possible drug-lab interaction between oral anticoagulant (rivaroxaban, edoxaban, or apixaban) and anti-Xa level (aka heparin level)    Goal of Therapy:  Heparin level 0.3-0.7 units/ml APTT 66-102 secs Monitor platelets by anticoagulation protocol: Yes   Plan:  Decrease heparin to 900 units/hr Recheck PTT/heparin level at [place], Shea Stakes [place] 02/26/2016,5:55 AM

## 2016-02-26 NOTE — Progress Notes (Signed)
Daily Rounding Note  02/26/2016, 9:15 AM  LOS: 2 days   SUBJECTIVE:   Chief complaint: abdominal pain: resolved.  No n/v.      Wants to have ERCP ASAP.  dissappointed that it may be Tuesday before it is done.   Staff notes she coughs and gags with liquids, does ok with crushed meds in apple sauce. Would like to have food.    OBJECTIVE:         Vital signs in last 24 hours:    Temp:  [97.9 F (36.6 C)-99.4 F (37.4 C)] 97.9 F (36.6 C) (11/26 0549) Pulse Rate:  [57-70] 57 (11/26 0549) Resp:  [16] 16 (11/25 2130) BP: (110-133)/(54-60) 121/58 (11/26 0549) SpO2:  [96 %-100 %] 100 % (11/26 0549) Last BM Date: 02/25/16 Filed Weights   02/24/16 1247  Weight: 73.9 kg (163 lb)   General: pleasant, cooperative, pale   Heart: RRR with SM Chest: clear but very diminished BS, no crackles/rales/wheezies.  No dyspnea Abdomen: soft, NT.  Active BS  Extremities: no CCE Neuro/Psych:  Oriented x 3.  Moves all limbs.  Fully alert.  HOH.   Intake/Output from previous day: 11/25 0701 - 11/26 0700 In: 170 [P.O.:120; IV Piggyback:50] Out: -   Intake/Output this shift: No intake/output data recorded.  Lab Results:  Recent Labs  02/24/16 1224 02/25/16 0518 02/26/16 0450  WBC 15.0* 11.3* 10.4  HGB 9.1* 8.5* 7.9*  HCT 31.2* 29.4* 26.6*  PLT 322 304 308   BMET  Recent Labs  02/24/16 1224 02/25/16 0518 02/26/16 0450  NA 142 142 140  K 4.3 4.5 4.3  CL 95* 98* 97*  CO2 39* 36* 38*  GLUCOSE 125* 114* 128*  BUN 17 18 29*  CREATININE 1.05* 1.04* 1.20*  CALCIUM 9.4 8.8* 8.6*   LFT  Recent Labs  02/24/16 1901 02/25/16 0518 02/26/16 0450  PROT 6.8 6.9 6.6  ALBUMIN 3.5 3.2* 2.9*  AST 601* 434* 190*  ALT 390* 364* 268*  ALKPHOS 141* 158* 148*  BILITOT 2.6* 3.2* 0.9  BILIDIR 1.6*  --   --   IBILI 1.0*  --   --    PT/INR  Recent Labs  02/24/16 1901  LABPROT 19.6*  INR 1.64   Hepatitis Panel  Recent  Labs  02/24/16 1901  HEPBSAG Negative  HCVAB <0.1  HEPAIGM Negative  HEPBIGM Negative    Studies/Results: Dg Chest 2 View  Result Date: 02/24/2016 CLINICAL DATA:  Shortness of Breath EXAM: CHEST  2 VIEW COMPARISON:  02/16/2016 FINDINGS: Cardiac shadow is stable. The lungs are well aerated bilaterally with diffuse interstitial changes stable from the prior study. No focal infiltrate is seen. Minimal left effusion is noted. Multiple compression deformities are noted within the thoracic spine. Aortic calcifications are noted. IMPRESSION: Small left-sided pleural effusion.  No focal infiltrate is seen. Electronically Signed   By: Inez Catalina M.D.   On: 02/24/2016 13:07   Ct Abdomen Pelvis W Contrast  Result Date: 02/25/2016 CLINICAL DATA:  82 y/o  F; general weakness and mild fever. EXAM: CT ABDOMEN AND PELVIS WITH CONTRAST TECHNIQUE: Multidetector CT imaging of the abdomen and pelvis was performed using the standard protocol following bolus administration of intravenous contrast. CONTRAST:  159m ISOVUE-300 IOPAMIDOL (ISOVUE-300) INJECTION 61% COMPARISON:  02/24/2016 abdominal ultrasound. 02/24/2016 chest radiograph. 04/05/2014 lumbar spine radiograph. FINDINGS: Lower chest: Small left pleural effusion. Moderate hiatal hernia. Coronary artery calcifications. Hepatobiliary: No focal liver abnormality. Mild distention of  the bladder and common bile duct measuring up to 12 mm. 2 mm gallstone within the lower common bile duct (series 5, image 47). Pancreas: Unremarkable. No pancreatic ductal dilatation or surrounding inflammatory changes. Spleen: Normal in size without focal abnormality. Adrenals/Urinary Tract: Adrenal glands are unremarkable. Kidneys are normal, without renal calculi, focal lesion, or hydronephrosis. Bladder is unremarkable. Stomach/Bowel: Stomach is within normal limits. Appendix appears normal. No evidence of bowel wall thickening, distention, or inflammatory changes.  Vascular/Lymphatic: Aortic atherosclerosis. No enlarged abdominal or pelvic lymph nodes. 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 Reproductive: Uterus is unremarkable. 6.8 cm right adnexal cystic lesion. No appreciable enhancing nodular components. Other: No abdominal wall hernia or abnormality. No abdominopelvic ascites. Musculoskeletal: T10 and L5 vertebral body compression deformities and L4 inferior endplate compression deformity stable in comparison with prior lumbar and chest radiographs. Moderate L1 compression deformity is new from prior radiographs. IMPRESSION: 1. Mild dilatation of common bile duct with 2 mm choledocholithiasis in the lower duct within head of pancreas. Given elevated liver function tests obstruction is suspected. No pancreatic ductal dilatation. 2. Moderate L1 compression deformity new from prior radiographs, age indeterminate. 3. Small left pleural effusion. 4. Moderate hiatal hernia. 5. Coronary artery calcification. 6. 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 7. **An incidental finding of potential clinical significance has been found. 6.8 cm right adnexal cystic lesion. Further evaluation with pelvic ultrasound is recommended on a nonemergent basis. This recommendation follows ACR consensus guidelines: White Paper of the ACR Incidental Findings Committee II on Adnexal Findings. J Am Coll Radiol [phone].** These results will be called to the ordering clinician or representative by the Radiologist Assistant, and communication documented in the PACS or zVision Dashboard. Electronically Signed   By: Kristine Garbe M.D.   On: 02/25/2016 04:13   US Abdomen Limited Ruq  Result Date: 02/24/2016 CLINICAL DATA:  Postprandial right upper quadrant and upper abdominal pain beginning on 02/23/2016. History of aortic stenosis, chronic kidney disease, and hypertension. EXAM: US ABDOMEN LIMITED - RIGHT UPPER QUADRANT  COMPARISON:  None. FINDINGS: Gallbladder: Limited visualization. No gallstones or wall thickening visualized. No sonographic Murphy sign noted by sonographer. Common bile duct: Diameter: Ranges from 3.8 to 8.4 mm. No filling defects demonstrated. Liver: Limited visualization. No focal lesion identified. Liver parenchymal echotexture appears diffusely decreased. Examination is technically limited, likely due to combination of patient body habitus and labored breathing. IMPRESSION: Technically limited examination with poor penetration of the liver and gallbladder but no gross evidence of cholelithiasis or cholecystitis. Distal bile ducts are at the upper limits of normal range but no significant bile duct dilatation. Electronically Signed   By: Lucienne Capers M.D.   On: 02/24/2016 23:22   Scheduled Meds: . ceFEPime (MAXIPIME) IV  1 g Intravenous Q12H  . fluticasone  1 spray Each Nare Daily  . guaiFENesin  600 mg Oral BID  . levalbuterol  0.63 mg Nebulization TID  . pantoprazole  40 mg Oral Daily  . tiotropium  18 mcg Inhalation Daily   Continuous Infusions: . heparin 900 Units/hr (02/26/16 0601)   PRN Meds:.ipratropium, levalbuterol, ondansetron (ZOFRAN) IV   ASSESMENT:   *  Choledocholithiasis, probable cholangitis.  Day 3 cefepime. Clinically and lab wise continues to improve.  May have passed stone.   *  A. fib, chronic Xarelto. Imaging reveals coronary artery and iliac calcification as well as celiac origin stenosis. Last dose of Xarelto was evening 02/24/16.   On intradermal  IV heparin  *  Normocytic anemia.   *  Thin liquid dysphagia.  RN tells me hospitalist is planning SLP eval tomorrow.  *  CKD. BUN/creatinine up in the last 24 hours.  Incontinent of urine so not able to gauge output. Marland Kitchen     PLAN   *  Consider MRCP on 11/27 if LFTs continue improvement and she has no recurrent ab pain or n/v, fevers/chills etc.    *  Allow full liquids, if these lead to gagging and  coughing then needs to be NPO.  RN says hospitalist is planning SLP eval 11/27.  Aspiration precautions in place.   *  I restarted the IV NS at 50 ml/hour.  Previous EF 60 to 93%, grade 1 Diastolic dysfunction, moderate AS on 2015 Echo.     Azucena Freed  02/26/2016, 9:15 AM Pager: [phone]

## 2016-02-26 NOTE — Progress Notes (Signed)
Dr. Havery Moros aware via phone that occult stool ordered this am was collected and resulted positive. No new orders received.

## 2016-02-26 NOTE — Progress Notes (Signed)
PROGRESS NOTE  Paula Contreras ZOX:[id number] DOB: [birth year] DOA: 02/24/2016 PCP: Vena Austria, MD  HPI/Recap of past 24 hours:  Feeling better, more pleasant today  RN report patient choking on fluids  Assessment/Plan: Principal Problem:   Ascending cholangitis due to choledocolithiasis Active Problems:   Hypertension   COPD (chronic obstructive pulmonary disease) (HCC)   Aortic valve disorder   Chronic respiratory failure (HCC)   PAF (paroxysmal atrial fibrillation) (HCC)   CKD (chronic kidney disease), stage III   HCAP (healthcare-associated pneumonia)   Abnormal LFTs   Epigastric pain   Nausea and vomiting   Common bile duct stone   Chronic anticoagulation - Xerelto   Cholodocholithiasis. Cholangitis: she is currently on clears, lft and GI symptom much improved,  She may or maynot need to have ercp  Continue cefepime, repeat lft in am, xarelto held, on heparin drip for anticipated surgery, will follow gi and general surgery recommendations   COPD with hypoxic respiratory failure on chronic home 02,  Mild scattered wheezing on exam, she denies feeling sob, report cough at baseline Continue xopenex and spiriva  PAF:  currently sinus rhythm, not on rate control agent or antiarrythmic meds.  Last dose xarelto given at 9pm on 11/24 She is currently on  heparin drip   H/o Aortic stenosis: stable, per cardiology office note  RN report patient choking on fluids, cxr on admission no infiltrate  will add thickener, aspiration precaution, swallow eval ordered.  Code Status: DNR  Family Communication: patient   Disposition Plan: remain inpatient, return to [facility] eventually   Consultants:  LBGI  General surgery  Procedures:  TBD  Antibiotics:  vanc from admission to 11/25  Cefepime from admission   Objective: BP (!) 121/58 (BP Location: Left Arm)   Pulse (!) 57   Temp 97.9 F (36.6 C) (Oral)   Resp 16   Ht '5\' 7"'$  (1.702 m)   Wt 73.9  kg (163 lb)   SpO2 100%   BMI 25.53 kg/m   Intake/Output Summary (Last 24 hours) at 02/26/16 1249 Last data filed at 02/26/16 1113  Gross per 24 hour  Intake              110 ml  Output                0 ml  Net              110 ml   Filed Weights   02/24/16 1247  Weight: 73.9 kg (163 lb)    Exam:   General:  NAD, hard of hearing  Cardiovascular: RRR, + systolic murmur 2/6 best heard at right upper sternal border  Respiratory: mild scattered wheezing  Abdomen: Soft/ND/NT, positive BS  Musculoskeletal: trace lower extremity Edema  Neuro: aaox3, impaired memory , likely at baseline  Data Reviewed: Basic Metabolic Panel:  Recent Labs Lab 02/24/16 1224 02/25/16 0518 02/26/16 0450  NA 142 142 140  K 4.3 4.5 4.3  CL 95* 98* 97*  CO2 39* 36* 38*  GLUCOSE 125* 114* 128*  BUN 17 18 29*  CREATININE 1.05* 1.04* 1.20*  CALCIUM 9.4 8.8* 8.6*   Liver Function Tests:  Recent Labs Lab 02/24/16 1224 02/24/16 1901 02/25/16 0518 02/26/16 0450  AST 768* 601* 434* 190*  ALT 449* 390* 364* 268*  ALKPHOS 134* 141* 158* 148*  BILITOT 1.8* 2.6* 3.2* 0.9  PROT 7.7 6.8 6.9 6.6  ALBUMIN 3.7 3.5 3.2* 2.9*    Recent Labs Lab 02/24/16 1901  LIPASE 20   No results for input(s): AMMONIA in the last 168 hours. CBC:  Recent Labs Lab 02/24/16 1224 02/25/16 0518 02/26/16 0450  WBC 15.0* 11.3* 10.4  NEUTROABS 13.6* 10.6*  --   HGB 9.1* 8.5* 7.9*  HCT 31.2* 29.4* 26.6*  MCV 91.0 90.5 89.0  PLT 322 304 308   Cardiac Enzymes:   No results for input(s): CKTOTAL, CKMB, CKMBINDEX, TROPONINI in the last 168 hours. BNP (last 3 results)  Recent Labs  02/16/16 1850  BNP 109.8*    ProBNP (last 3 results) No results for input(s): PROBNP in the last 8760 hours.  CBG: No results for input(s): GLUCAP in the last 168 hours.  Recent Results (from the past 240 hour(s))  Culture, blood (routine x 2) Call MD if unable to obtain prior to antibiotics being given     Status:  None (Preliminary result)   Collection Time: 02/24/16  8:56 PM  Result Value Ref Range Status   Specimen Description BLOOD LEFT ARM  Final   Special Requests IN PEDIATRIC BOTTLE 2 CC  Final   Culture   Final    NO GROWTH 2 DAYS Performed at [facility]    Report Status PENDING  Incomplete  Culture, blood (routine x 2) Call MD if unable to obtain prior to antibiotics being given     Status: None (Preliminary result)   Collection Time: 02/24/16  8:56 PM  Result Value Ref Range Status   Specimen Description BLOOD LEFT ARM  Final   Special Requests IN PEDIATRIC BOTTLE 2 CC  Final   Culture   Final    NO GROWTH 2 DAYS Performed at [facility]    Report Status PENDING  Incomplete  MRSA PCR Screening     Status: None   Collection Time: 02/24/16  9:25 PM  Result Value Ref Range Status   MRSA by PCR NEGATIVE NEGATIVE Final    Comment:        The GeneXpert MRSA Assay (FDA approved for NASAL specimens only), is one component of a comprehensive MRSA colonization surveillance program. It is not intended to diagnose MRSA infection nor to guide or monitor treatment for MRSA infections.      Studies: No results found.  Scheduled Meds: . [START ON 02/27/2016] acetaminophen  1,000 mg Oral On Call to OR  . [START ON 02/27/2016]  ceFAZolin (ANCEF) IV  2 g Intravenous On Call to OR  . ceFEPime (MAXIPIME) IV  1 g Intravenous Q12H  . Chlorhexidine Gluconate Cloth  6 each Topical Once   And  . [START ON 02/27/2016] Chlorhexidine Gluconate Cloth  6 each Topical Once  . fluticasone  1 spray Each Nare Daily  . [START ON 02/27/2016] gabapentin  300 mg Oral On Call to OR  . guaiFENesin  600 mg Oral BID  . levalbuterol  0.63 mg Nebulization TID  . pantoprazole  40 mg Oral Daily  . tiotropium  18 mcg Inhalation Daily    Continuous Infusions: . sodium chloride    . heparin 900 Units/hr (02/26/16 0601)     Time spent: 34mns  Louana Fontenot MD, PhD  [facility] Pager  [phone] If 7PM-7AM, please contact night-coverage at [email or web address], password [facility]11/26/2017, 12:49 PM  LOS: 2 days

## 2016-02-26 NOTE — Progress Notes (Signed)
Over last two days with clear and full liquids pt coughs and desats. No acute distress. Dr. Erlinda Hong aware yesterday and today. GI PA and dietician aware today. Pt scheduled for swallow evaluation tomorrow.

## 2016-02-26 NOTE — Progress Notes (Signed)
Initial Nutrition Assessment  DOCUMENTATION CODES:   Non-severe (moderate) malnutrition in context of chronic illness  INTERVENTION:  Advance diet per MD.  Provide Boost Plus po BID, each supplement provides 360 kcal and 14 grams protein. Will monitor for SLP evaluation and recommendations to see if ONS needs to be changed.  Encouraged patient to eat adequate protein with meals. Discussed protein options with patient and also discussed other protein foods she can eat if she does not like the main course (yogurt, cottage cheese).   NUTRITION DIAGNOSIS:   Malnutrition (Moderate) related to chronic illness (COPD, dementia) as evidenced by moderate depletions of muscle mass, mild depletion of body fat.  GOAL:   Patient will meet greater than or equal to 90% of their needs  MONITOR:   PO intake, Supplement acceptance, Diet advancement, Weight trends, I & O's, Labs  REASON FOR ASSESSMENT:   Malnutrition Screening Tool    ASSESSMENT:   82 y.o. woman with a history of paroxysmal atrial fibrillation (CHADS-Vasc score of 4, anticoagulated with Xarelto), aortic stenosis (valve area around 1.5cm2 on last echo), COPD with chronic respiratory failure on 3L [place] at home at baseline, CKD 3, and dementia who presents to the ED for evaluation of generalized weakness, persistent cough, and fever. Found to have choledocholithiasis, probable cholangitis.    -Plan for MRCP vs ERCP pending repeat LFTs.  Spoke with patient at bedside. She reports her appetite is good and she was eating well until Thursday night. Typical intake is 3 meals per day at her [place]. Patient endorses N/V and abdominal pain that started Thursday evening. Patient denies difficulty chewing/swallowing (though reported in chart). She reports she has upper dentures and takes small bites, but has not had issues before. Patient is unsure of previous UBW, but endorses she has been gaining weight since she moved into  Assisted Living because she has been eating well. Weight gain noted in chart - this time last year she was 130 lbs.   Patient amenable to drinking Boost Plus po BID. She likes Boost better than Ensure. Will monitor for SLP evaluation to see if need to change to different supplement pending recommendations.  Medications reviewed and include: pantoprazole, NS @ 50 ml/hr, heparin @ 9 ml/hr.  Labs reviewed: Chloride 97, Co2 38, BUN 29, Creatinine 1.2, Alk Phos 148 (trending down), Albumin 2.9, AST 190 (trending down), ALT 268 (trending down), T Bili WNL today.   Nutrition-Focused physical exam completed. Findings are mild fat depletion, moderate muscle depletion, and no edema.   Discussed with RN. Patient coughing after swallowing thin liquids. Plan is for SLP evaluation tomorrow. Patient denying any difficulty chewing or swallowing.  Patient meets criteria for moderate chronic malnutrition in setting of mild fat depletion, moderate muscle depletion. This is likely improving in setting of improved intake at Assisted Living and weight gain.  Diet Order:  Diet full liquid Room service appropriate? Yes; Fluid consistency: Thin Diet NPO time specified  Skin:  Reviewed, no issues  Last BM:  02/25/2016  Height:   Ht Readings from Last 1 Encounters:  02/24/16 _0  (1.702 m)    Weight:   Wt Readings from Last 1 Encounters:  02/24/16 163 lb (73.9 kg)    Ideal Body Weight:  61.36 kg  BMI:  Body mass index is 25.53 kg/m.  Estimated Nutritional Needs:   Kcal:  1500-1700 (MSJ x 1.2-1.4)  Protein:  75-90 grams (1-1.2 grams/kg)  Fluid:  >/= 1.8 L/day (25 ml/kg)  EDUCATION NEEDS:  Education needs addressed  Willey Blade, MS, RD, LDN Pager: [phone] After Hours Pager: [phone]

## 2016-02-27 ENCOUNTER — Inpatient Hospital Stay (HOSPITAL_COMMUNITY): Payer: Medicare Other

## 2016-02-27 DIAGNOSIS — Z7901 Long term (current) use of anticoagulants: Secondary | ICD-10-CM

## 2016-02-27 LAB — CBC
HCT: 27.2 % — ABNORMAL LOW (ref 36.0–46.0)
Hemoglobin: 8.1 g/dL — ABNORMAL LOW (ref 12.0–15.0)
MCH: 25.9 pg — AB (ref 26.0–34.0)
MCHC: 29.8 g/dL — AB (ref 30.0–36.0)
MCV: 86.9 fL (ref 78.0–100.0)
PLATELETS: 308 10*3/uL (ref 150–400)
RBC: 3.13 MIL/uL — ABNORMAL LOW (ref 3.87–5.11)
RDW: 14.3 % (ref 11.5–15.5)
WBC: 8.7 10*3/uL (ref 4.0–10.5)

## 2016-02-27 LAB — COMPREHENSIVE METABOLIC PANEL
ALK PHOS: 132 U/L — AB (ref 38–126)
ALT: 198 U/L — ABNORMAL HIGH (ref 14–54)
ANION GAP: 6 (ref 5–15)
AST: 89 U/L — ABNORMAL HIGH (ref 15–41)
Albumin: 3.1 g/dL — ABNORMAL LOW (ref 3.5–5.0)
BUN: 25 mg/dL — ABNORMAL HIGH (ref 6–20)
CALCIUM: 8.3 mg/dL — AB (ref 8.9–10.3)
CO2: 36 mmol/L — AB (ref 22–32)
Chloride: 97 mmol/L — ABNORMAL LOW (ref 101–111)
Creatinine, Ser: 1.03 mg/dL — ABNORMAL HIGH (ref 0.44–1.00)
GFR calc non Af Amer: 50 mL/min — ABNORMAL LOW (ref >60)
GFR, EST AFRICAN AMERICAN: 58 mL/min — AB (ref >60)
Glucose, Bld: 89 mg/dL (ref 65–99)
POTASSIUM: 3.4 mmol/L — AB (ref 3.5–5.1)
SODIUM: 139 mmol/L (ref 135–145)
Total Bilirubin: 0.9 mg/dL (ref 0.3–1.2)
Total Protein: 6.4 g/dL — ABNORMAL LOW (ref 6.5–8.1)

## 2016-02-27 LAB — LEGIONELLA PNEUMOPHILA SEROGP 1 UR AG: L. PNEUMOPHILA SEROGP 1 UR AG: NEGATIVE

## 2016-02-27 LAB — APTT
APTT: 90 s — AB (ref 24–36)
aPTT: 69 seconds — ABNORMAL HIGH (ref 24–36)

## 2016-02-27 LAB — HEPARIN LEVEL (UNFRACTIONATED)
HEPARIN UNFRACTIONATED: 0.73 [IU]/mL — AB (ref 0.30–0.70)
Heparin Unfractionated: 1.02 IU/mL — ABNORMAL HIGH (ref 0.30–0.70)

## 2016-02-27 MED ORDER — SODIUM CHLORIDE 0.9 % IV SOLN
INTRAVENOUS | Status: DC
  Administered 2016-02-29 (×2): via INTRAVENOUS

## 2016-02-27 MED ORDER — INDOMETHACIN 50 MG RE SUPP
100.0000 mg | Freq: Once | RECTAL | Status: AC
  Administered 2016-02-27: 100 mg via RECTAL
  Filled 2016-02-27: qty 2

## 2016-02-27 MED ORDER — POTASSIUM CHLORIDE CRYS ER 20 MEQ PO TBCR
40.0000 meq | EXTENDED_RELEASE_TABLET | Freq: Once | ORAL | Status: AC
  Administered 2016-02-27: 40 meq via ORAL
  Filled 2016-02-27: qty 2

## 2016-02-27 MED ORDER — SODIUM CHLORIDE 0.9 % IV SOLN
3.0000 g | Freq: Four times a day (QID) | INTRAVENOUS | Status: DC
  Administered 2016-02-27 – 2016-03-02 (×15): 3 g via INTRAVENOUS
  Filled 2016-02-27 (×18): qty 3

## 2016-02-27 MED ORDER — GADOBENATE DIMEGLUMINE 529 MG/ML IV SOLN
15.0000 mL | Freq: Once | INTRAVENOUS | Status: AC | PRN
  Administered 2016-02-27: 15 mL via INTRAVENOUS

## 2016-02-27 NOTE — Progress Notes (Signed)
ANTICOAGULATION CONSULT NOTE - [place] for Heparin Indication: atrial fibrillation, bridge while off xarelto  Allergies  Allergen Reactions  . Codeine Nausea And Vomiting    Patient Measurements: Height: '5\' 7"'$  (170.2 cm) Weight: 163 lb (73.9 kg) IBW/kg (Calculated) : 61.6   Vital Signs: Temp: 97.9 F (36.6 C) (11/27 0536) Temp Source: Oral (11/27 0536) BP: 137/53 (11/27 0536) Pulse Rate: 56 (11/27 0536)  Labs:  Recent Labs  02/24/16 1901 02/25/16 0518  02/25/16 1403 02/26/16 0450 02/26/16 1500 02/27/16 0159  HGB  --  8.5*  --   --  7.9*  --  8.1*  HCT  --  29.4*  --   --  26.6*  --  27.2*  PLT  --  304  --   --  308  --  308  APTT  --   --   < > 57* 156* 111* 90*  LABPROT 19.6*  --   --   --   --   --   --   INR 1.64  --   --   --   --   --   --   HEPARINUNFRC  --   --   --  2.01* 1.74*  --  1.02*  CREATININE  --  1.04*  --   --  1.20*  --  1.03*  < > = values in this interval not displayed.  Estimated Creatinine Clearance: 42.4 mL/min (by C-G formula based on SCr of 1.03 mg/dL (H)).  Medications, infusions . sodium chloride    . sodium chloride 50 mL/hr at 02/27/16 6440  . heparin 800 Units/hr (02/27/16 0110)     Assessment: 82 yo F admitted with cholangitis and need for possible surgical procedure by GI (MRCP vs. ERCP). She is chronically on Xarelto for her Afib (CHADS2Vasc=4).  Last dose 11/24 @ 2118.  Pharmacy has been consulted to dose Heparin IV while off Xarelto pending invasive procedures.   Today, 02/27/16 - aPTT 69 - HL 0.73, remains elevated d/t recent Xarelto use, but decreasing appropriately.  Continue to dose adjust using aPTT. - CBC: Hgb 8.1 stable, (decreased from, 9.1 on admission.  Per GI, no signs of bleeding, likely due to repeated blood draws and fluids). Pltc WNL - No infusion issues reported per nursing - No bleeding or complications - FOBT positive 11/26 - MD aware and continuing heparin at this  time  Goal of Therapy:  Heparin level 0.3-0.7 units/ml aPTT 66-102s seconds Monitor platelets by anticoagulation protocol: Yes   Plan:   Continue heparin infusion 800 units/hr  Follow up plans for MRCP on 11/27 (heparin does not need to be held prior to the procedure)  Daily aPTT, heparin level, CBC  Baseline heparin level (anti-Xa level) falsely elevated due to effects of Xarelto.  Will use aPTTs to guide heparin therapy until heparin levels correlate with aPTTs.    Gretta Arab PharmD, BCPS Pager [phone] 02/27/2016 10:59 AM

## 2016-02-27 NOTE — Progress Notes (Signed)
Subjective: She is still tender over her gallbladder, but overall a little better.  She wishes we would figure it out and make he better.  Still tender on my palpation of her RUQ.  Objective: Vital signs in last 24 hours: Temp:  [97.9 F (36.6 C)-99.1 F (37.3 C)] 97.9 F (36.6 C) (11/27 0536) Pulse Rate:  [56-69] 56 (11/27 0536) Resp:  [16-18] 18 (11/27 0536) BP: (126-142)/(52-73) 137/53 (11/27 0536) SpO2:  [93 %-100 %] 100 % (11/27 0536) Last BM Date: 02/25/16 NPO IV 621 ml recorded Voided x 5 BM x 2 LFT's better, WBC better, H/H is low MRCP ordered,   Intake/Output from previous day: 11/26 0701 - 11/27 0700 In: 781 [P.O.:60; I.V.:621; IV Piggyback:100] Out: -  Intake/Output this shift: Total I/O In: 400 [I.V.:400] Out: -   General appearance: alert, cooperative, no distress and hard of hearing GI: soft, tender RUQ with palpation.  few BS.  Lab Results:   Recent Labs  02/26/16 0450 02/27/16 0159  WBC 10.4 8.7  HGB 7.9* 8.1*  HCT 26.6* 27.2*  PLT 308 308    BMET  Recent Labs  02/26/16 0450 02/27/16 0159  NA 140 139  K 4.3 3.4*  CL 97* 97*  CO2 38* 36*  GLUCOSE 128* 89  BUN 29* 25*  CREATININE 1.20* 1.03*  CALCIUM 8.6* 8.3*   PT/INR  Recent Labs  02/24/16 1901  LABPROT 19.6*  INR 1.64     Recent Labs Lab 02/24/16 1224 02/24/16 1901 02/25/16 0518 02/26/16 0450 02/27/16 0159  AST 768* 601* 434* 190* 89*  ALT 449* 390* 364* 268* 198*  ALKPHOS 134* 141* 158* 148* 132*  BILITOT 1.8* 2.6* 3.2* 0.9 0.9  PROT 7.7 6.8 6.9 6.6 6.4*  ALBUMIN 3.7 3.5 3.2* 2.9* 3.1*     Lipase     Component Value Date/Time   LIPASE 20 02/24/2016 1901     Studies/Results: No results found. Prior to Admission medications   Medication Sig Start Date End Date Taking? Authorizing Provider  acetaminophen (TYLENOL) 325 MG tablet Take 650 mg by mouth every 6 (six) hours as needed (for pain).    Yes Historical Provider, MD  albuterol (PROAIR HFA) 108 (90  BASE) MCG/ACT inhaler Inhale 2 puffs into the lungs every 4 (four) hours as needed for wheezing or shortness of breath. 12/24/14  Yes Collene Gobble, MD  alendronate (FOSAMAX) 70 MG tablet Take 70 mg by mouth every Monday. Take with a full glass of water on an empty stomach.   Yes Historical Provider, MD  calcium carbonate (OS-CAL) 600 MG TABS tablet Take 2 tablets by mouth daily with breakfast.    Yes Historical Provider, MD  Cholecalciferol (VITAMIN D) 2000 UNITS CAPS Take 2,000 Units by mouth daily with breakfast.    Yes Historical Provider, MD  clobetasol (TEMOVATE) 0.05 % external solution apply 4 drops to scalp one daily Monday- Thursday for psoriasis   Yes Historical Provider, MD  esomeprazole (NEXIUM) 20 MG capsule Take 20 mg by mouth daily at 12 noon.   Yes Historical Provider, MD  hydrocortisone 2.5 % cream Apply 1 application topically See admin instructions. Apply to Affected area topically to ears twice a day on Monday Tuesday Wednesday and Thursday   Yes Historical Provider, MD  ipratropium (ATROVENT) 0.02 % nebulizer solution Take 2.5 mLs (0.5 mg total) by nebulization every 2 (two) hours as needed for wheezing or shortness of breath. Patient taking differently: Take 0.5 mg by nebulization every 6 (six) hours  as needed for wheezing or shortness of breath.  02/23/15  Yes Theodis Blaze, MD  ketoconazole (NIZORAL) 2 % shampoo Apply 1 application topically as directed.   Yes Historical Provider, MD  levalbuterol (XOPENEX) 0.63 MG/3ML nebulizer solution Take 3 mLs (0.63 mg total) by nebulization every 2 (two) hours as needed for wheezing or shortness of breath. Patient taking differently: Take 0.63 mg by nebulization every 6 (six) hours as needed for wheezing or shortness of breath.  02/23/15  Yes Theodis Blaze, MD  Multiple Vitamins-Iron (MULTIVITAMIN/IRON PO) Take 1 tablet by mouth daily.   Yes Historical Provider, MD  OXYGEN Inhale 2-3 L into the lungs continuous. Change to 3L when on  portable   Yes Historical Provider, MD  rivaroxaban (XARELTO) 20 MG TABS tablet Take 20 mg by mouth daily.    Yes Historical Provider, MD  senna-docusate (SENOKOT-S) 8.6-50 MG tablet Take 1 tablet by mouth at bedtime as needed for mild constipation. 02/23/15  Yes Theodis Blaze, MD  Tiotropium Bromide Monohydrate (SPIRIVA RESPIMAT) 2.5 MCG/ACT AERS Inhale 2 puffs into the lungs daily.   Yes Historical Provider, MD    Medications: . ampicillin-sulbactam (UNASYN) IV  3 g Intravenous Q6H  .  ceFAZolin (ANCEF) IV  2 g Intravenous On Call to OR  . Chlorhexidine Gluconate Cloth  6 each Topical Once   And  . Chlorhexidine Gluconate Cloth  6 each Topical Once  . fluticasone  1 spray Each Nare Daily  . guaiFENesin  600 mg Oral BID  . lactose free nutrition  237 mL Oral BID BM  . levalbuterol  0.63 mg Nebulization TID  . pantoprazole  40 mg Oral Daily  . potassium chloride  40 mEq Oral Once  . tiotropium  18 mcg Inhalation Daily   . sodium chloride    . sodium chloride 50 mL/hr at 02/27/16 2419  . heparin 800 Units/hr (02/27/16 0110)   Assessment/Plan Choledocholithiasis with cholangitis. Atrial fibrillation on Xarelto at home O2 dependent COPD Chronic kidney disease Anemia  FEN:  NPO RV:ACQPEAKL x 3 days completed 11/26;  Unasyn started 02/27/16 DVT:  Heparin per pharmacy  Plan:  She is to get an MRCP with down trending LFT's.  Continue antibiotics and we will follow with you.        LOS: 3 days    Maryagnes Carrasco 02/27/2016 [phone]

## 2016-02-27 NOTE — NC FL2 (Signed)
[place] LEVEL OF CARE SCREENING TOOL     IDENTIFICATION  Patient Name: Paula Contreras Birthdate: 07-21-1933 Sex: female Admission Date (Current Location): 02/24/2016  [facility] and [place] Number:  Herbalist and Address:  [facility],  [place] [address] [place]      Provider Number: [id number]  Attending Physician Name and Address:  Hosie Poisson, MD  Relative Name and Phone Number:       Current Level of Care: Hospital Recommended Level of Care: [place] Prior Approval Number:    Date Approved/Denied:   PASRR Number:    Discharge Plan:  (ALF )    Current Diagnoses: Patient Active Problem List   Diagnosis Date Noted  . Common bile duct stone 02/25/2016  . Chronic anticoagulation - Xerelto 02/25/2016  . Ascending cholangitis due to choledocolithiasis 02/25/2016  . HCAP (healthcare-associated pneumonia) 02/24/2016  . Abnormal LFTs 02/24/2016  . Epigastric pain 02/24/2016  . Nausea and vomiting 02/24/2016  . History of bradycardia 10/27/2015  . SOB (shortness of breath)   . Encounter for palliative care   . Hypernatremia 02/21/2015  . Altered mental status 02/20/2015  . Metabolic encephalopathy 70/96/2836  . Weakness generalized 02/20/2015  . Dehydration 02/20/2015  . Leukocytosis 02/20/2015  . Anemia 02/20/2015  . Bronchitis 02/20/2015  . Fall at nursing home 02/20/2015  . Bradycardia 04/09/2014  . Chronic respiratory failure ([place])   . Aortic stenosis   . PAF (paroxysmal atrial fibrillation) ([place])   . CKD (chronic kidney disease), stage III   . Aortic valve disorder 10/30/2013  . Undiagnosed cardiac murmurs 07/21/2013  . Hypertension 10/02/2010  . COPD (chronic obstructive pulmonary disease) ([place]) 10/02/2010    Orientation RESPIRATION BLADDER Height & Weight     Place, Situation, Self, Time  O2 (AT 2L) Incontinent Weight: 163 lb (73.9 kg) Height:  '5\' 7"'$  (170.2 cm)  BEHAVIORAL SYMPTOMS/MOOD  NEUROLOGICAL BOWEL NUTRITION STATUS   (NONE )  (NONE ) Incontinent Diet (Full Liquid )  AMBULATORY STATUS COMMUNICATION OF NEEDS Skin   Supervision Verbally Normal                       Personal Care Assistance Level of Assistance  Bathing, Feeding, Dressing Bathing Assistance: Limited assistance Feeding assistance: Limited assistance Dressing Assistance: Limited assistance     Functional Limitations Info  Speech, Hearing, Sight Sight Info: Adequate Hearing Info: Impaired Speech Info: Adequate    SPECIAL CARE FACTORS FREQUENCY                       Contractures      Additional Factors Info  Code Status, Allergies Code Status Info: DNR CODE  Allergies Info: Codeine           Current Medications (02/27/2016):  This is the current hospital active medication list Current Facility-Administered Medications  Medication Dose Route Frequency Provider Last Rate Last Dose  . 0.9 %  sodium chloride infusion   Intravenous Continuous Vena Rua, PA-C      . 0.9 %  sodium chloride infusion   Intravenous Continuous Vena Rua, PA-C 50 mL/hr at 02/27/16 6294    . Ampicillin-Sulbactam (UNASYN) 3 g in sodium chloride 0.9 % 100 mL IVPB  3 g Intravenous Q6H Christine E Shade, RPH      . ceFAZolin (ANCEF) IVPB 2g/100 mL premix  2 g Intravenous On Call to OR Michael Boston, MD   Stopped at  02/27/16 5053  . Chlorhexidine Gluconate Cloth 2 % PADS 6 each  6 each Topical Once Michael Boston, MD       And  . Chlorhexidine Gluconate Cloth 2 % PADS 6 each  6 each Topical Once Michael Boston, MD      . fluticasone (FLONASE) 50 MCG/ACT nasal spray 1 spray  1 spray Each Nare Daily Florencia Reasons, MD   1 spray at 02/27/16 1139  . guaiFENesin (MUCINEX) 12 hr tablet 600 mg  600 mg Oral BID Florencia Reasons, MD   600 mg at 02/27/16 1139  . heparin ADULT infusion 100 units/mL (25000 units/285m sodium chloride 0.45%)  800 Units/hr Intravenous Continuous FFlorencia Reasons MD 8 mL/hr at 02/27/16 0110 800 Units/hr at  02/27/16 0110  . ipratropium (ATROVENT) nebulizer solution 0.5 mg  0.5 mg Nebulization Q6H PRN NLily Kocher MD   0.5 mg at 02/25/16 0105  . lactose free nutrition (BOOST PLUS) liquid 237 mL  237 mL Oral BID BM FFlorencia Reasons MD      . levalbuterol ([facility] nebulizer solution 0.63 mg  0.63 mg Nebulization Q6H PRN NLily Kocher MD   0.63 mg at 02/25/16 0104  . levalbuterol (XOPENEX) nebulizer solution 0.63 mg  0.63 mg Nebulization TID NLily Kocher MD   0.63 mg at 02/27/16 09767 . ondansetron (ZOFRAN) injection 4 mg  4 mg Intravenous Q6H PRN NLily Kocher MD      . pantoprazole (PROTONIX) EC tablet 40 mg  40 mg Oral Daily NLily Kocher MD   40 mg at 02/27/16 1139  . tiotropium (SPIRIVA) inhalation capsule 18 mcg  18 mcg Inhalation Daily NLily Kocher MD   18 mcg at 02/27/16 03419    Discharge Medications: Please see discharge summary for a list of discharge medications.  Relevant Imaging Results:  Relevant Lab Results:   Additional Information SSN [id number] NRozell Searing

## 2016-02-27 NOTE — Progress Notes (Signed)
     [facility] Progress Note  Subjective: No abdominal pain today  Objective:  Vital signs in last 24 hours: Temp:  [97.9 F (36.6 C)-99.1 F (37.3 C)] 97.9 F (36.6 C) (11/27 0536) Pulse Rate:  [56-69] 56 (11/27 0536) Resp:  [16-18] 18 (11/27 0536) BP: (126-142)/(52-73) 137/53 (11/27 0536) SpO2:  [93 %-100 %] 100 % (11/27 0536) Last BM Date: 02/25/16 General:    Alert, well-developed,whtie female in NAD  Heart:  Regular rate and rhythm; + murmur, no lower extremity edema Pulm: )2 per [place]. Normal respiratory effort, lungs CTA bilaterally. Abdomen:  Soft, nondistended, nontender.  Normal bowel sounds, no masses felt.   Neurologic:  Alert and  oriented x4;  grossly normal neurologically. Psych:  Alert and cooperative. Normal mood and affect. Skin:   Intake/Output from previous day: 11/26 0701 - 11/27 0700 In: 781 [P.O.:60; I.V.:621; IV Piggyback:100] Out: -  Intake/Output this shift: No intake/output data recorded.  Lab Results:  Recent Labs  02/25/16 0518 02/26/16 0450 02/27/16 0159  WBC 11.3* 10.4 8.7  HGB 8.5* 7.9* 8.1*  HCT 29.4* 26.6* 27.2*  PLT 304 308 308   BMET  Recent Labs  02/25/16 0518 02/26/16 0450 02/27/16 0159  NA 142 140 139  K 4.5 4.3 3.4*  CL 98* 97* 97*  CO2 36* 38* 36*  GLUCOSE 114* 128* 89  BUN 18 29* 25*  CREATININE 1.04* 1.20* 1.03*  CALCIUM 8.8* 8.6* 8.3*   LFT  Recent Labs  02/24/16 1901  02/27/16 0159  PROT 6.8  < > 6.4*  ALBUMIN 3.5  < > 3.1*  AST 601*  < > 89*  ALT 390*  < > 198*  ALKPHOS 141*  < > 132*  BILITOT 2.6*  < > 0.9  BILIDIR 1.6*  --   --   IBILI 1.0*  --   --   < > = values in this interval not displayed. PT/INR  Hepatitis Panel  Recent Labs  02/24/16 1901  HEPBSAG Negative  HCVAB <0.1  HEPAIGM Negative  HEPBIGM Negative    Assessment / Plan:  38. 82 yo female with multiple medical problems admitted with abdominal pain, elevated LFTs, choledocholithiasis / ? Cholangitis.  Abdominal pain resolved. LFTs continue to trend down. Afebrile and WBC has normalized from 15.0 three days ago.  Hopefully she has passed stone.  -MRCP ordered -am CMET -Surgery following for expected cholecystectomy -Continue IV antibiotics  2. Atrial fibrillation. Xarelto on hold (last dose evening of 11/24). On heparin gtt.  3. COPD, oxygen dependent  4. Chronic normocytic anemia, hgb low but stable at 8.1    LOS: 3 days   Tye Savoy  02/27/2016, 11:19 AM  Pager number [phone]  GI ATTENDING  Case reviewed with Dr. Havery Moros. Interval data and history reviewed. Patient off getting MRCP. Await results to further direct treatment tree.  Docia Chuck. Geri [place]in[place]., M.D. [facility] of Gastroenterology

## 2016-02-27 NOTE — Progress Notes (Signed)
Heparin drip was restarted at [place] 42m/hr.  [place]

## 2016-02-27 NOTE — Progress Notes (Signed)
Pt went down for MRI today Heparin drip was stopped at 1630 and noted per pharmacy request. Paula Contreras

## 2016-02-27 NOTE — Progress Notes (Signed)
PROGRESS NOTE  Paula Contreras OQH:[id number] DOB: 06/04/1933 DOA: 02/24/2016 PCP: Vena Austria, MD  HPI/Recap of past 24 hours:  No new complaints.    Assessment/Plan: Principal Problem:   Ascending cholangitis due to choledocolithiasis Active Problems:   Hypertension   COPD (chronic obstructive pulmonary disease) (HCC)   Aortic valve disorder   Chronic respiratory failure (HCC)   PAF (paroxysmal atrial fibrillation) (HCC)   CKD (chronic kidney disease), stage III   HCAP (healthcare-associated pneumonia)   Abnormal LFTs   Epigastric pain   Nausea and vomiting   Common bile duct stone   Chronic anticoagulation - Xerelto   Cholodocholithiasis. Cholangitis: she is currently on clears advanced to liquid diet, lft and GI symptom much improved,.  MRCP  Ordered, repeat lft in am show improvement, xarelto held, on heparin drip for anticipated surgery, will follow gi and general surgery recommendations.   COPD with hypoxic respiratory failure on chronic home 02,  Mild scattered wheezing on exam, she denies feeling sob, report cough at baseline Continue xopenex and spiriva  PAF:  currently sinus rhythm, not on rate control agent or antiarrythmic meds.  Last dose xarelto given at 9pm on 11/24 She is currently on  heparin drip   H/o Aortic stenosis: stable, per cardiology office note  Dysphagia: RN report patient choking on fluids, cxr on admission no infiltrate  will add thickener, aspiration precaution, swallow eval ordered.  Code Status: DNR  Family Communication: patient   Disposition Plan: remain inpatient, return to [facility] eventually   Consultants:  LBGI  General surgery  Procedures:  TBD  Antibiotics:  vanc from admission to 11/25  Cefepime from admission til 11/27  unasyn 11/27.    Objective: BP (!) 137/53 (BP Location: Left Arm)   Pulse (!) 56   Temp 97.9 F (36.6 C) (Oral)   Resp 18   Ht '5\' 7"'$  (1.702 m)   Wt 73.9 kg (163 lb)    SpO2 100%   BMI 25.53 kg/m   Intake/Output Summary (Last 24 hours) at 02/27/16 1409 Last data filed at 02/27/16 1000  Gross per 24 hour  Intake             1121 ml  Output                0 ml  Net             1121 ml   Filed Weights   02/24/16 1247  Weight: 73.9 kg (163 lb)    Exam:   General:  NAD, hard of hearing  Cardiovascular: RRR, + systolic murmur 2/6 best heard at right upper sternal border  Respiratory: mild scattered wheezing  Abdomen: Soft/ND/NT, positive BS  Musculoskeletal: trace lower extremity Edema  Neuro: aaox3, impaired memory , likely at baseline  Data Reviewed: Basic Metabolic Panel:  Recent Labs Lab 02/24/16 1224 02/25/16 0518 02/26/16 0450 02/27/16 0159  NA 142 142 140 139  K 4.3 4.5 4.3 3.4*  CL 95* 98* 97* 97*  CO2 39* 36* 38* 36*  GLUCOSE 125* 114* 128* 89  BUN 17 18 29* 25*  CREATININE 1.05* 1.04* 1.20* 1.03*  CALCIUM 9.4 8.8* 8.6* 8.3*   Liver Function Tests:  Recent Labs Lab 02/24/16 1224 02/24/16 1901 02/25/16 0518 02/26/16 0450 02/27/16 0159  AST 768* 601* 434* 190* 89*  ALT 449* 390* 364* 268* 198*  ALKPHOS 134* 141* 158* 148* 132*  BILITOT 1.8* 2.6* 3.2* 0.9 0.9  PROT 7.7 6.8 6.9 6.6 6.4*  ALBUMIN  3.7 3.5 3.2* 2.9* 3.1*    Recent Labs Lab 02/24/16 1901  LIPASE 20   No results for input(s): AMMONIA in the last 168 hours. CBC:  Recent Labs Lab 02/24/16 1224 02/25/16 0518 02/26/16 0450 02/27/16 0159  WBC 15.0* 11.3* 10.4 8.7  NEUTROABS 13.6* 10.6*  --   --   HGB 9.1* 8.5* 7.9* 8.1*  HCT 31.2* 29.4* 26.6* 27.2*  MCV 91.0 90.5 89.0 86.9  PLT 322 304 308 308   Cardiac Enzymes:   No results for input(s): CKTOTAL, CKMB, CKMBINDEX, TROPONINI in the last 168 hours. BNP (last 3 results)  Recent Labs  02/16/16 1850  BNP 109.8*    ProBNP (last 3 results) No results for input(s): PROBNP in the last 8760 hours.  CBG: No results for input(s): GLUCAP in the last 168 hours.  Recent Results (from the  past 240 hour(s))  Culture, blood (routine x 2) Call MD if unable to obtain prior to antibiotics being given     Status: None (Preliminary result)   Collection Time: 02/24/16  8:56 PM  Result Value Ref Range Status   Specimen Description BLOOD LEFT ARM  Final   Special Requests IN PEDIATRIC BOTTLE 2 CC  Final   Culture   Final    NO GROWTH 3 DAYS Performed at [facility]    Report Status PENDING  Incomplete  Culture, blood (routine x 2) Call MD if unable to obtain prior to antibiotics being given     Status: None (Preliminary result)   Collection Time: 02/24/16  8:56 PM  Result Value Ref Range Status   Specimen Description BLOOD LEFT ARM  Final   Special Requests IN PEDIATRIC BOTTLE 2 CC  Final   Culture   Final    NO GROWTH 3 DAYS Performed at [facility]    Report Status PENDING  Incomplete  MRSA PCR Screening     Status: None   Collection Time: 02/24/16  9:25 PM  Result Value Ref Range Status   MRSA by PCR NEGATIVE NEGATIVE Final    Comment:        The GeneXpert MRSA Assay (FDA approved for NASAL specimens only), is one component of a comprehensive MRSA colonization surveillance program. It is not intended to diagnose MRSA infection nor to guide or monitor treatment for MRSA infections.      Studies: No results found.  Scheduled Meds: . ampicillin-sulbactam (UNASYN) IV  3 g Intravenous Q6H  .  ceFAZolin (ANCEF) IV  2 g Intravenous On Call to OR  . Chlorhexidine Gluconate Cloth  6 each Topical Once   And  . Chlorhexidine Gluconate Cloth  6 each Topical Once  . fluticasone  1 spray Each Nare Daily  . guaiFENesin  600 mg Oral BID  . lactose free nutrition  237 mL Oral BID BM  . levalbuterol  0.63 mg Nebulization TID  . pantoprazole  40 mg Oral Daily  . tiotropium  18 mcg Inhalation Daily    Continuous Infusions: . sodium chloride    . sodium chloride 50 mL/hr at 02/27/16 3300  . heparin 800 Units/hr (02/27/16 0110)     Time spent:  63mns  Amiree No MD,  [facility] Pager [phone] If 7PM-7AM, please contact night-coverage at [email or web address], password [email or web address] [facility]11/27/2017, 2:09 PM  LOS: 3 days

## 2016-02-27 NOTE — Progress Notes (Signed)
ANTICOAGULATION CONSULT NOTE - Follow Up Consult  Pharmacy Consult for Heparin Indication: atrial fibrillation-->bridge while off xarelto  Allergies  Allergen Reactions  . Codeine Nausea And Vomiting    Patient Measurements: Height: '5\' 7"'$  (170.2 cm) Weight: 163 lb (73.9 kg) IBW/kg (Calculated) : 61.6 Heparin Dosing Weight:   Vital Signs: Temp: 98.1 F (36.7 C) (11/26 2303) Temp Source: Axillary (11/26 2303) BP: 142/73 (11/26 2303) Pulse Rate: 58 (11/26 2303)  Labs:  Recent Labs  02/24/16 1901 02/25/16 0518  02/25/16 1403 02/26/16 0450 02/26/16 1500 02/27/16 0159  HGB  --  8.5*  --   --  7.9*  --  8.1*  HCT  --  29.4*  --   --  26.6*  --  27.2*  PLT  --  304  --   --  308  --  308  APTT  --   --   < > 57* 156* 111* 90*  LABPROT 19.6*  --   --   --   --   --   --   INR 1.64  --   --   --   --   --   --   HEPARINUNFRC  --   --   --  2.01* 1.74*  --  1.02*  CREATININE  --  1.04*  --   --  1.20*  --  1.03*  < > = values in this interval not displayed.  Estimated Creatinine Clearance: 42.4 mL/min (by C-G formula based on SCr of 1.03 mg/dL (H)).   Medications:  Infusions:  . sodium chloride    . sodium chloride 50 mL/hr at 02/27/16 2518  . heparin 800 Units/hr (02/27/16 0110)    Assessment: Patient with PTT at goal but heparin level above goal.  Feel high heparin level is due to prior rivaroxaban and not a true measure of heparin activity.  PTT ordered with Heparin level until both correlate due to possible drug-lab interaction between oral anticoagulant (rivaroxaban, edoxaban, or apixaban) and anti-Xa level (aka heparin level)   Goal of Therapy:  Heparin level 0.3-0.7 units/ml aPTT 66-102 seconds Monitor platelets by anticoagulation protocol: Yes   Plan:  Continue heparin drip at current rate Recheck level and PTT at 1000  Tyler Deis, Shea Stakes [place] 02/27/2016,3:58 AM

## 2016-02-27 NOTE — Evaluation (Addendum)
SLP Cancellation Note  Patient Details Name: Paula Contreras MRN: [id number] DOB: 05-27-1933   Cancelled treatment:       Reason Eval/Treat Not Completed: Other (comment) (npo for CT)  Luanna Salk, [place] SLP [phone]

## 2016-02-27 NOTE — Progress Notes (Signed)
Pharmacy Antibiotic Note  Paula Contreras is a 82 y.o. female admitted on 02/24/2016 with suspected pneumonia.  She c/o generalized weakness, productive cough, fever.  Pharmacy was initially consulted for Vancomycin dosing and renal antibiotic adjustment.  She was narrowed to Cefepime alone, but with concern for cholangitis, Pharmacy is now consulted to dose Unasyn for better GI/anaerobe coverage.  Day #4 Cefepime  CrCl ~ 42 ml/min WBC improved to WNL Afebrile  Plan:  Unasyn 3g IV q6 hrs  Follow up renal fxn, culture results, and clinical course.  Height: '5\' 7"'$  (170.2 cm) Weight: 163 lb (73.9 kg) IBW/kg (Calculated) : 61.6  Temp (24hrs), Avg:98.4 F (36.9 C), Min:97.9 F (36.6 C), Max:99.1 F (37.3 C)   Recent Labs Lab 02/24/16 1224 02/24/16 1254 02/24/16 1525 02/25/16 0518 02/26/16 0450 02/27/16 0159  WBC 15.0*  --   --  11.3* 10.4 8.7  CREATININE 1.05*  --   --  1.04* 1.20* 1.03*  LATICACIDVEN  --  1.12 0.72  --   --   --     Estimated Creatinine Clearance: 42.4 mL/min (by C-G formula based on SCr of 1.03 mg/dL (H)).    Allergies  Allergen Reactions  . Codeine Nausea And Vomiting    Antimicrobials this admission: 11/24 Vancomycin >> 11/25 11/24 Cefepime >> 11/27 11/27 Unasyn >>   Dose adjustments this admission:   Microbiology results: 11/24 BCx: NGTD 11/24 MRSA PCR: negative  Thank you for allowing pharmacy to be a part of this patient's care.  Gretta Arab PharmD, BCPS Pager [phone] 02/27/2016 11:03 AM

## 2016-02-28 DIAGNOSIS — I10 Essential (primary) hypertension: Secondary | ICD-10-CM

## 2016-02-28 LAB — COMPREHENSIVE METABOLIC PANEL
ALT: 131 U/L — AB (ref 14–54)
AST: 42 U/L — AB (ref 15–41)
Albumin: 3 g/dL — ABNORMAL LOW (ref 3.5–5.0)
Alkaline Phosphatase: 132 U/L — ABNORMAL HIGH (ref 38–126)
Anion gap: 8 (ref 5–15)
BILIRUBIN TOTAL: 0.8 mg/dL (ref 0.3–1.2)
BUN: 19 mg/dL (ref 6–20)
CO2: 32 mmol/L (ref 22–32)
CREATININE: 0.9 mg/dL (ref 0.44–1.00)
Calcium: 8.3 mg/dL — ABNORMAL LOW (ref 8.9–10.3)
Chloride: 105 mmol/L (ref 101–111)
GFR, EST NON AFRICAN AMERICAN: 59 mL/min — AB (ref >60)
Glucose, Bld: 65 mg/dL (ref 65–99)
Potassium: 4.6 mmol/L (ref 3.5–5.1)
Sodium: 145 mmol/L (ref 135–145)
TOTAL PROTEIN: 6.1 g/dL — AB (ref 6.5–8.1)

## 2016-02-28 LAB — CBC
HEMATOCRIT: 30 % — AB (ref 36.0–46.0)
Hemoglobin: 8.7 g/dL — ABNORMAL LOW (ref 12.0–15.0)
MCH: 26.6 pg (ref 26.0–34.0)
MCHC: 29 g/dL — ABNORMAL LOW (ref 30.0–36.0)
MCV: 91.7 fL (ref 78.0–100.0)
Platelets: 318 10*3/uL (ref 150–400)
RBC: 3.27 MIL/uL — AB (ref 3.87–5.11)
RDW: 14.6 % (ref 11.5–15.5)
WBC: 6.1 10*3/uL (ref 4.0–10.5)

## 2016-02-28 LAB — APTT: APTT: 70 s — AB (ref 24–36)

## 2016-02-28 LAB — HEPARIN LEVEL (UNFRACTIONATED): HEPARIN UNFRACTIONATED: 0.67 [IU]/mL (ref 0.30–0.70)

## 2016-02-28 MED ORDER — CLOBETASOL PROPIONATE 0.05 % EX CREA
TOPICAL_CREAM | CUTANEOUS | Status: DC
  Administered 2016-02-28: 1 via TOPICAL
  Administered 2016-02-29 – 2016-03-01 (×2): via TOPICAL
  Filled 2016-02-28: qty 15

## 2016-02-28 MED ORDER — HYDROCORTISONE 1 % EX CREA
TOPICAL_CREAM | CUTANEOUS | Status: DC
  Administered 2016-02-28: 1 via TOPICAL
  Administered 2016-02-29 – 2016-03-01 (×2): via TOPICAL
  Filled 2016-02-28: qty 28

## 2016-02-28 MED ORDER — HYDROCORTISONE 2.5 % EX CREA: 1.0000 "application " | TOPICAL_CREAM | CUTANEOUS | Status: DC

## 2016-02-28 MED ORDER — KETOCONAZOLE 2 % EX SHAM: 1.0000 "application " | MEDICATED_SHAMPOO | CUTANEOUS | Status: DC

## 2016-02-28 MED ORDER — CLOBETASOL PROPIONATE 0.05 % EX SOLN
4.0000 "application " | CUTANEOUS | Status: DC
  Filled 2016-02-28: qty 50

## 2016-02-28 NOTE — Care Management Important Message (Signed)
Important Message  Patient Details  Name: Paula Contreras MRN: [id number] Date of Birth: February 07, 1934   Medicare Important Message Given:  Yes    Camillo Flaming 02/28/2016, 12:33 [place] Message  Patient Details  Name: Paula Contreras MRN: [id number] Date of Birth: July 10, 1933   Medicare Important Message Given:  Yes    Camillo Flaming 02/28/2016, 12:32 PM

## 2016-02-28 NOTE — Evaluation (Signed)
Clinical/Bedside Swallow Evaluation Patient Details  Name: Paula Contreras MRN: [id number] Date of Birth: 07-Jan-1934  Today's Date: 02/28/2016 Time: SLP Start Time (ACUTE ONLY): 1640 SLP Stop Time (ACUTE ONLY): 1696 SLP Time Calculation (min) (ACUTE ONLY): 35 min  Past Medical History:  Past Medical History:  Diagnosis Date  . Anemia 02/20/2015  . Aortic stenosis    a. 2D Echo 07/2013: EF 60-65%, no RWMA, grade 1 DD, normal LV filling pressure, moderate AS, mild TR, PASP 46mHg.  . Bradycardia    a. possible bradycardia with HR 44 by physical exam notes at nephrologist office in 03/2014. F/u event monitor showed NSR PACs average HR 81, bradycardia <1% of readable data, no pauses of 3 seconds or longer..  . Cataract   . Chronic respiratory failure ([facility]    a. On home O2 since 2012.  . CKD (chronic kidney disease), stage III   . COPD (chronic obstructive pulmonary disease) ([facility]   . Dementia   . Hypertension   . Osteoporosis   . Other psoriasis and similar disorders    psoriasis vs eczema  . Paroxysmal atrial fibrillation ([facility]    a. Dx 07/2013. No attempts per Epic to restore NSR but in NSR 2016.  . Skin cancer    Past Surgical History:  Past Surgical History:  Procedure Laterality Date  . BREAST SURGERY Right 1964   tumor removed  . CATARACT EXTRACTION Bilateral   . HIP SURGERY Right    fracture   HPI:  82[facility] cholantitis due to choledocolitiasis - Pt reports she has a stone and is scheduled for surgery tomorrow.  Swallow evaluation ordered.  PMH also + for COPD, smoking, GERD per pt.     Assessment / Plan / Recommendation Clinical Impression  pt presents with negative CN exam, upper denture and few lower teeth.  She did not demonstrate indications of aspiration with po observed *cranberry juice mixed with apple, 4 graham crackers - adequate mastication and clearance noted. Pt does admit to issues with swallowing large pills and some meats  stating they stick in her throat * pointing to proximal esophagus region.  She requires liquids to clear and takes medications that are large crushed.  Ms ADismorereports she got choked on her pork chop today and it made her friend slap her on the back.  Advised her to order soft meats only and to start meals with liquids due to xerostomia.  Recommend     Aspiration Risk  Mild aspiration risk    Diet Recommendation Regular;Thin liquid   Liquid Administration via: Cup;Straw Medication Administration: Whole meds with liquid (medicine with puree = crushed if large and not contraindicated) Supervision: Patient able to self feed Compensations: Slow rate;Small sips/bites (start meals with liquids)    Other  Recommendations Oral Care Recommendations: Oral care BID   Follow up Recommendations None      Frequency and Duration            Prognosis        Swallow Study   General Date of Onset: 02/28/16 H[facility]: 82[facility] - Pt reports she has a stone and is scheduled for surgery tomorrow.  Swallow evaluation ordered.  PMH also + for COPD, smoking, GERD per pt.   Type of Study: Bedside Swallow Evaluation Diet Prior to this Study: Regular Temperature Spikes Noted: No Respiratory Status: Nasal cannula History of Recent Intubation: No Behavior/Cognition: Alert;Cooperative;Pleasant  mood Oral Cavity Assessment: Within Functional Limits Oral Care Completed by SLP: No Oral Cavity - Dentition: Dentures, top (few lower teeth, pt does not use her partial) Vision: Functional for self-feeding Self-Feeding Abilities: Able to feed self Patient Positioning: Upright in bed Baseline Vocal Quality: Normal Volitional Cough: Strong Volitional Swallow: Able to elicit    Oral/Motor/Sensory Function Overall Oral Motor/Sensory Function: Within functional limits   Ice Chips Ice chips: Not tested   Thin Liquid Thin Liquid: Within functional  limits Presentation: Self Fed;Straw    Nectar Thick Nectar Thick Liquid: Not tested   Honey Thick Honey Thick Liquid: Not tested   Puree Puree: Not tested   Solid   GO   Solid: Within functional limits Presentation: [place], Jerome [facility] SLP [phone]

## 2016-02-28 NOTE — Progress Notes (Signed)
* Surgery Date in Future *  Subjective: Pt says she feels better. GI plans ERCP tomorrow.  I talked about walking and she is very certain she does not want to walk in halls here.  "people look at me."  She is not interested in PT assist with amublation. Objective: Vital signs in last 24 hours: Temp:  [97.8 F (36.6 C)-98.1 F (36.7 C)] 97.8 F (36.6 C) (11/28 0600) Pulse Rate:  [56-59] 59 (11/28 0600) Resp:  [18] 18 (11/28 0600) BP: (118-151)/(54-55) 151/54 (11/28 0600) SpO2:  [98 %-100 %] 98 % (11/28 0942) Last BM Date: 02/25/16 NPO 1279 IV Voided x6 Afebrile, VSS LFT's better, WBC is normal MRCP 02/27/16:  Mild central intra hepatic biliary dilatation and mild dilatation of extrahepatic common bile duct, measuring up to 9 mm. Tiny 3 mm hypo intense filling defect within the distal common bile duct at the head of the pancreas, could reflect a small stone. Otherwise no additional hypo intense filling defects, debris, or abnormal ductal enhancement identified. Intake/Output from previous day: 11/27 0701 - 11/28 0700 In: 3086 [I.V.:1279; IV Piggyback:200] Out: -  Intake/Output this shift: No intake/output data recorded.  General appearance: alert and no distress GI: soft, non tender this Am  Lab Results:   Recent Labs  02/27/16 0159 02/28/16 0448  WBC 8.7 6.1  HGB 8.1* 8.7*  HCT 27.2* 30.0*  PLT 308 318    BMET  Recent Labs  02/27/16 0159 02/28/16 0448  NA 139 145  K 3.4* 4.6  CL 97* 105  CO2 36* 32  GLUCOSE 89 65  BUN 25* 19  CREATININE 1.03* 0.90  CALCIUM 8.3* 8.3*   PT/INR No results for input(s): LABPROT, INR in the last 72 hours.   Recent Labs Lab 02/24/16 1901 02/25/16 0518 02/26/16 0450 02/27/16 0159 02/28/16 0448  AST 601* 434* 190* 89* 42*  ALT 390* 364* 268* 198* 131*  ALKPHOS 141* 158* 148* 132* 132*  BILITOT 2.6* 3.2* 0.9 0.9 0.8  PROT 6.8 6.9 6.6 6.4* 6.1*  ALBUMIN 3.5 3.2* 2.9* 3.1* 3.0*     Lipase     Component Value  Date/Time   LIPASE 20 02/24/2016 1901     Studies/Results: Mr 3d Recon At Scanner  Result Date: 02/27/2016 CLINICAL DATA:  Abnormal LFTs EXAM: MRI ABDOMEN WITHOUT AND WITH CONTRAST (INCLUDING MRCP) TECHNIQUE: Multiplanar multisequence MR imaging of the abdomen was performed both before and after the administration of intravenous contrast. Heavily T2-weighted images of the biliary and pancreatic ducts were obtained, and three-dimensional MRCP images were rendered by post processing. CONTRAST:  5m MULTIHANCE GADOBENATE DIMEGLUMINE 529 MG/ML IV SOLN COMPARISON:  CT 02/25/2016, ultrasound 02/24/2016 FINDINGS: Lower chest: Small bilateral pleural effusions left greater than right are visualized. Probable atelectasis at the posterior lung bases and within the lingula. Increased T2 signal in the pericardium is felt secondary to inhomogeneous fat suppression. Hepatobiliary: Signal intensity is within normal limits. No focal hepatic abnormality is visualized. No focal contrast-enhancing lesions. Mild central intra hepatic biliary dilatation. No discrete filling defects are visualized within the slightly dilated intra hepatic ducts. Extrahepatic common bile duct is dilated up to 9 mm. Best seen on the axial T2 fat suppressed images is a 3 mm hypo intensity within the distal common bile duct at the head of the pancreas, series 4, image number 30. No additional filling defects are visualized along the course of the common bile duct. No focal filling defects within the imaged gallbladder. No surrounding gallbladder edema. No ductal  enhancement. Pancreas: No enlargement of the pancreatic duct. No peripancreatic fluid collections. Spleen:  Normal in size.  No focal abnormalities. Adrenals/Urinary Tract: Adrenal glands are within normal limits. Mild perinephric edema, nonspecific. No focal renal lesions are visualized. Stomach/Bowel: Limited evaluation by MRI. Small moderate hiatal hernia. No grossly dilated bowel in the  upper abdomen. No bowel wall edema. Vascular/Lymphatic: Imaged aorta within normal limits for size. No abnormally enlarged lymph nodes. Other: Small amount of free fluid around the spleen. Small amount of subcutaneous edema within the lateral abdominal wall and posterior subcutaneous soft tissues. Musculoskeletal: No significant marrow edema. IMPRESSION: 1. Mild central intra hepatic biliary dilatation and mild dilatation of extrahepatic common bile duct, measuring up to 9 mm. Tiny 3 mm hypo intense filling defect within the distal common bile duct at the head of the pancreas, could reflect a small stone. Otherwise no additional hypo intense filling defects, debris, or abnormal ductal enhancement identified. 2. Small bilateral pleural effusions. Small amount of free fluid in the upper abdomen. Electronically Signed   By: Donavan Foil M.D.   On: 02/27/2016 19:57   Mr Abdomen Mrcp Moise Boring Contast  Result Date: 02/27/2016 CLINICAL DATA:  Abnormal LFTs EXAM: MRI ABDOMEN WITHOUT AND WITH CONTRAST (INCLUDING MRCP) TECHNIQUE: Multiplanar multisequence MR imaging of the abdomen was performed both before and after the administration of intravenous contrast. Heavily T2-weighted images of the biliary and pancreatic ducts were obtained, and three-dimensional MRCP images were rendered by post processing. CONTRAST:  85m MULTIHANCE GADOBENATE DIMEGLUMINE 529 MG/ML IV SOLN COMPARISON:  CT 02/25/2016, ultrasound 02/24/2016 FINDINGS: Lower chest: Small bilateral pleural effusions left greater than right are visualized. Probable atelectasis at the posterior lung bases and within the lingula. Increased T2 signal in the pericardium is felt secondary to inhomogeneous fat suppression. Hepatobiliary: Signal intensity is within normal limits. No focal hepatic abnormality is visualized. No focal contrast-enhancing lesions. Mild central intra hepatic biliary dilatation. No discrete filling defects are visualized within the slightly  dilated intra hepatic ducts. Extrahepatic common bile duct is dilated up to 9 mm. Best seen on the axial T2 fat suppressed images is a 3 mm hypo intensity within the distal common bile duct at the head of the pancreas, series 4, image number 30. No additional filling defects are visualized along the course of the common bile duct. No focal filling defects within the imaged gallbladder. No surrounding gallbladder edema. No ductal enhancement. Pancreas: No enlargement of the pancreatic duct. No peripancreatic fluid collections. Spleen:  Normal in size.  No focal abnormalities. Adrenals/Urinary Tract: Adrenal glands are within normal limits. Mild perinephric edema, nonspecific. No focal renal lesions are visualized. Stomach/Bowel: Limited evaluation by MRI. Small moderate hiatal hernia. No grossly dilated bowel in the upper abdomen. No bowel wall edema. Vascular/Lymphatic: Imaged aorta within normal limits for size. No abnormally enlarged lymph nodes. Other: Small amount of free fluid around the spleen. Small amount of subcutaneous edema within the lateral abdominal wall and posterior subcutaneous soft tissues. Musculoskeletal: No significant marrow edema. IMPRESSION: 1. Mild central intra hepatic biliary dilatation and mild dilatation of extrahepatic common bile duct, measuring up to 9 mm. Tiny 3 mm hypo intense filling defect within the distal common bile duct at the head of the pancreas, could reflect a small stone. Otherwise no additional hypo intense filling defects, debris, or abnormal ductal enhancement identified. 2. Small bilateral pleural effusions. Small amount of free fluid in the upper abdomen. Electronically Signed   By: KDonavan FoilM.D.   On:  02/27/2016 19:57    Medications: . ampicillin-sulbactam (UNASYN) IV  3 g Intravenous Q6H  . Chlorhexidine Gluconate Cloth  6 each Topical Once   And  . Chlorhexidine Gluconate Cloth  6 each Topical Once  . fluticasone  1 spray Each Nare Daily  .  guaiFENesin  600 mg Oral BID  . lactose free nutrition  237 mL Oral BID BM  . levalbuterol  0.63 mg Nebulization TID  . pantoprazole  40 mg Oral Daily  . tiotropium  18 mcg Inhalation Daily   . sodium chloride    . sodium chloride 50 mL/hr at 02/28/16 0238  . heparin 800 Units/hr (02/27/16 0110)    Assessment/Plan Choledocholithiasis with cholangitis. Atrial fibrillation on Xarelto at home O2 dependent COPD Chronic kidney disease Anemia  FEN:  Regular diet UO:[id number] x 3 days completed 11/26;  Unasyn started 02/27/16, day 2 DVT:  Heparin per pharmacy   Plan:  ERCP tomorrow, and if OK we will plan cholecystectomy after that.   LOS: 4 days    Paula Contreras 02/28/2016 [phone]

## 2016-02-28 NOTE — Progress Notes (Addendum)
ANTICOAGULATION CONSULT NOTE - [place] for Heparin Indication: atrial fibrillation, bridge while off xarelto  Allergies  Allergen Reactions  . Codeine Nausea And Vomiting    Patient Measurements: Height: '5\' 7"'$  (170.2 cm) Weight: 163 lb (73.9 kg) IBW/kg (Calculated) : 61.6   Vital Signs: Temp: 97.8 F (36.6 C) (11/28 0600) Temp Source: Oral (11/28 0600) BP: 151/54 (11/28 0600) Pulse Rate: 59 (11/28 0600)  Labs:  Recent Labs  02/26/16 0450  02/27/16 0159 02/27/16 0946 02/28/16 0448  HGB 7.9*  --  8.1*  --  8.7*  HCT 26.6*  --  27.2*  --  30.0*  PLT 308  --  308  --  318  APTT 156*  < > 90* 69* 70*  HEPARINUNFRC 1.74*  --  1.02* 0.73* 0.67  CREATININE 1.20*  --  1.03*  --  0.90  < > = values in this interval not displayed.  Estimated Creatinine Clearance: 48.5 mL/min (by C-G formula based on SCr of 0.9 mg/dL).  Medications, infusions . sodium chloride    . sodium chloride 50 mL/hr at 02/28/16 0238  . heparin 800 Units/hr (02/27/16 0110)     Assessment: 82 yo F admitted with cholangitis and need for possible GI surgical procedures.  She is chronically on Xarelto for her Afib (CHADS2Vasc=4).  Last dose 11/24 @ 2118.  Pharmacy has been consulted to dose Heparin IV while off Xarelto pending invasive procedures.   Today, 02/28/16 - aPTT 70 - HL 0.67, decreased to therapeutic range and correlates with therapeutic aPTT.   - CBC: Hgb 8.7 improved (decreased from, 9.1 on admission.  Per GI, no signs of bleeding, likely due to repeated blood draws and fluids). Pltc WNL - No infusion issues reported per nursing - No bleeding or complications noted - FOBT positive 11/26 - MD aware and continuing heparin at this time  Goal of Therapy:  Heparin level 0.3-0.7 units/ml aPTT 66-102s seconds Monitor platelets by anticoagulation protocol: Yes   Plan:   Continue heparin infusion 800 units/hr  Daily heparin level, CBC  D/c aPTT monitoring  since heparin levels (anti-Xa level) falsely elevated due to effects of Xarelto have decreased.    Follow up plans for resuming oral anticoagulation after MRCP/procedures.   Gretta Arab PharmD, BCPS Pager [phone] 02/28/2016 7:08 AM   Addendum:  GI planning for ERCP tomorrow at noon.  Pharmacy will hold Heparin gtt at 06:00 AM  Surgery notes indicate plan for cholecystectomy after ERCP (procedure date/time not yet available.)  Pharmacy will f/u plans for resuming heparin post procedures.  Gretta Arab PharmD, BCPS Pager [phone] 02/28/2016 11:27 AM

## 2016-02-28 NOTE — Progress Notes (Signed)
[facility] Progress Note   Chief Complaint: abdominal pain , elevated LFT  Subjective: No abdominal pain today. Hungry  Objective:  Vital signs in last 24 hours: Temp:  [97.8 F (36.6 C)-98.1 F (36.7 C)] 97.8 F (36.6 C) (11/28 0600) Pulse Rate:  [56-59] 59 (11/28 0600) Resp:  [18] 18 (11/28 0600) BP: (118-151)/(54-55) 151/54 (11/28 0600) SpO2:  [98 %-100 %] 100 % (11/28 0600) Last BM Date: 02/25/16 General:   Alert, well-developed,  White female  in NAD EENT:  Hard of hearing, non icteric sclera, conjunctive pink.  Heart:  Regular rate and rhythm, no lower extremity edema Pulm: 02 per [place], normal respiratory effort, lungs CTA bilaterally. She hums with expiration. Abdomen:  Soft, nondistended, nontender.  Normal bowel sounds, no masses felt. No hepatomegaly.    Neurologic:  Alert and  oriented x4;  grossly normal neurologically. Psych:  Alert and cooperative. Normal mood and affect. Skin:   Intake/Output from previous day: 11/27 0701 - 11/28 0700 In: 4259 [I.V.:1279; IV Piggyback:200] Out: -  Intake/Output this shift: No intake/output data recorded.  Lab Results:  Recent Labs  02/26/16 0450 02/27/16 0159 02/28/16 0448  WBC 10.4 8.7 6.1  HGB 7.9* 8.1* 8.7*  HCT 26.6* 27.2* 30.0*  PLT 308 308 318   BMET  Recent Labs  02/26/16 0450 02/27/16 0159 02/28/16 0448  NA 140 139 145  K 4.3 3.4* 4.6  CL 97* 97* 105  CO2 38* 36* 32  GLUCOSE 128* 89 65  BUN 29* 25* 19  CREATININE 1.20* 1.03* 0.90  CALCIUM 8.6* 8.3* 8.3*   LFT  Recent Labs  02/28/16 0448  PROT 6.1*  ALBUMIN 3.0*  AST 42*  ALT 131*  ALKPHOS 132*  BILITOT 0.8    Mr 3d Recon At Scanner  Result Date: 02/27/2016 CLINICAL DATA:  Abnormal LFTs EXAM: MRI ABDOMEN WITHOUT AND WITH CONTRAST (INCLUDING MRCP) TECHNIQUE: Multiplanar multisequence MR imaging of the abdomen was performed both before and after the administration of intravenous contrast. Heavily T2-weighted images  of the biliary and pancreatic ducts were obtained, and three-dimensional MRCP images were rendered by post processing. CONTRAST:  25m MULTIHANCE GADOBENATE DIMEGLUMINE 529 MG/ML IV SOLN COMPARISON:  CT 02/25/2016, ultrasound 02/24/2016 FINDINGS: Lower chest: Small bilateral pleural effusions left greater than right are visualized. Probable atelectasis at the posterior lung bases and within the lingula. Increased T2 signal in the pericardium is felt secondary to inhomogeneous fat suppression. Hepatobiliary: Signal intensity is within normal limits. No focal hepatic abnormality is visualized. No focal contrast-enhancing lesions. Mild central intra hepatic biliary dilatation. No discrete filling defects are visualized within the slightly dilated intra hepatic ducts. Extrahepatic common bile duct is dilated up to 9 mm. Best seen on the axial T2 fat suppressed images is a 3 mm hypo intensity within the distal common bile duct at the head of the pancreas, series 4, image number 30. No additional filling defects are visualized along the course of the common bile duct. No focal filling defects within the imaged gallbladder. No surrounding gallbladder edema. No ductal enhancement. Pancreas: No enlargement of the pancreatic duct. No peripancreatic fluid collections. Spleen:  Normal in size.  No focal abnormalities. Adrenals/Urinary Tract: Adrenal glands are within normal limits. Mild perinephric edema, nonspecific. No focal renal lesions are visualized. Stomach/Bowel: Limited evaluation by MRI. Small moderate hiatal hernia. No grossly dilated bowel in the upper abdomen. No bowel wall edema. Vascular/Lymphatic: Imaged aorta within normal limits for size. No abnormally enlarged lymph nodes. Other:  Small amount of free fluid around the spleen. Small amount of subcutaneous edema within the lateral abdominal wall and posterior subcutaneous soft tissues. Musculoskeletal: No significant marrow edema. IMPRESSION: 1. Mild central  intra hepatic biliary dilatation and mild dilatation of extrahepatic common bile duct, measuring up to 9 mm. Tiny 3 mm hypo intense filling defect within the distal common bile duct at the head of the pancreas, could reflect a small stone. Otherwise no additional hypo intense filling defects, debris, or abnormal ductal enhancement identified. 2. Small bilateral pleural effusions. Small amount of free fluid in the upper abdomen. Electronically Signed   By: Donavan Foil M.D.   On: 02/27/2016 19:57   Mr Abdomen Mrcp Moise Boring Contast  Result Date: 02/27/2016 CLINICAL DATA:  Abnormal LFTs EXAM: MRI ABDOMEN WITHOUT AND WITH CONTRAST (INCLUDING MRCP) TECHNIQUE: Multiplanar multisequence MR imaging of the abdomen was performed both before and after the administration of intravenous contrast. Heavily T2-weighted images of the biliary and pancreatic ducts were obtained, and three-dimensional MRCP images were rendered by post processing. CONTRAST:  67m MULTIHANCE GADOBENATE DIMEGLUMINE 529 MG/ML IV SOLN COMPARISON:  CT 02/25/2016, ultrasound 02/24/2016 FINDINGS: Lower chest: Small bilateral pleural effusions left greater than right are visualized. Probable atelectasis at the posterior lung bases and within the lingula. Increased T2 signal in the pericardium is felt secondary to inhomogeneous fat suppression. Hepatobiliary: Signal intensity is within normal limits. No focal hepatic abnormality is visualized. No focal contrast-enhancing lesions. Mild central intra hepatic biliary dilatation. No discrete filling defects are visualized within the slightly dilated intra hepatic ducts. Extrahepatic common bile duct is dilated up to 9 mm. Best seen on the axial T2 fat suppressed images is a 3 mm hypo intensity within the distal common bile duct at the head of the pancreas, series 4, image number 30. No additional filling defects are visualized along the course of the common bile duct. No focal filling defects within the imaged  gallbladder. No surrounding gallbladder edema. No ductal enhancement. Pancreas: No enlargement of the pancreatic duct. No peripancreatic fluid collections. Spleen:  Normal in size.  No focal abnormalities. Adrenals/Urinary Tract: Adrenal glands are within normal limits. Mild perinephric edema, nonspecific. No focal renal lesions are visualized. Stomach/Bowel: Limited evaluation by MRI. Small moderate hiatal hernia. No grossly dilated bowel in the upper abdomen. No bowel wall edema. Vascular/Lymphatic: Imaged aorta within normal limits for size. No abnormally enlarged lymph nodes. Other: Small amount of free fluid around the spleen. Small amount of subcutaneous edema within the lateral abdominal wall and posterior subcutaneous soft tissues. Musculoskeletal: No significant marrow edema. IMPRESSION: 1. Mild central intra hepatic biliary dilatation and mild dilatation of extrahepatic common bile duct, measuring up to 9 mm. Tiny 3 mm hypo intense filling defect within the distal common bile duct at the head of the pancreas, could reflect a small stone. Otherwise no additional hypo intense filling defects, debris, or abnormal ductal enhancement identified. 2. Small bilateral pleural effusions. Small amount of free fluid in the upper abdomen. Electronically Signed   By: KDonavan FoilM.D.   On: 02/27/2016 19:57    Assessment / Plan:  Abnormal LFTs / ? choledocholithiasis with possible cholangitis. MRCP reveals CBD dilation up to 995m There is a 46m60mypointense filling defect (? Small stone) within the distal CBD at head of pancreas. LFTs continue to improve. On day #5 of antibiotics. She is afebrile, normal white count, negative blood cultures at day 3.  -Plan is for ERCP with MAC tomorrow at noon. Risks  and benefits of ERCP with possible sphincterotomy discussed with patient and she consents to proceed. -Heparin will need to be held for procedure, will contact pharmacy.  -can eat today, NPO after  midnight -doesn't need pre-procedure antibiotics, already getting Unasyn.     LOS: 4 days   Paula Contreras  02/28/2016, 9:21 AM Pager number [phone]  GI ATTENDING  Interval history and data reviewed. Patient personally seen and examined. Doing well clinically. No pain or other complaints. Very hard of hearing. Exam benign. Agree with interval progress note as outlined above. CT and MRI both suggested small distal common duct stone. I believe the patient would be best served with ERCP and sphincterotomy with ductal clearance to avoid recurrent cholangitis from recurrent stone and passage of subsequent stone should they occur. Given her age and comorbidities this may be reasonable as definitive therapy and foregoing high-risk elective cholecystectomy. Discussed with patient. Discussed with general surgery Dr. Excell Seltzer.  Docia Chuck. Geri [place]., M.D. [facility] Division of Gastroenterology

## 2016-02-28 NOTE — Progress Notes (Signed)
PROGRESS NOTE  Paula Contreras FXT:[id number] DOB: [birth year] DOA: 02/24/2016 PCP: Vena Austria, MD  HPI/Recap of past 24 hours:  No new complaints. She reports feeling good.    Assessment/Plan: Principal Problem:   Ascending cholangitis due to choledocolithiasis Active Problems:   Hypertension   COPD (chronic obstructive pulmonary disease) (HCC)   Aortic valve disorder   Chronic respiratory failure (HCC)   PAF (paroxysmal atrial fibrillation) (HCC)   CKD (chronic kidney disease), stage III   HCAP (healthcare-associated pneumonia)   Abnormal LFTs   Epigastric pain   Nausea and vomiting   Common bile duct stone   Chronic anticoagulation - Xerelto   Cholodocholithiasis. Cholangitis: she is currently on clears advanced to liquid diet, lft and GI symptom much improved,.  MRCP  Ordered  , showed  Mild central intra hepatic biliary dilatation and mild dilatation of extrahepatic common bile duct, measuring up to 9 mm. Tiny 3 mm hypo intense filling defect within the distal common bile duct at the head of the pancreas, could reflect a small stone. Otherwise no additional hypo intense filling defects, debris, or abnormal ductal enhancement identified.,  repeat lft in am show improvement, xarelto held, on heparin drip for anticipated surgery,  Plan for ERCP in am for stone removal and possible sphincterotomy.  will follow gi and general surgery recommendations.  Normocytic Anemia: stable hemoglobin between 8 to 9. Stool for occult blood is positive.  transfuse to keep hemoglobin greater than 7.   Hypokalemia: replete as needed.   Acute kidney injury: resolved with hydration.      COPD with hypoxic respiratory failure on chronic home 02,  No wheezing today.  she denies feeling sob, no coughing today.  Continue xopenex and spiriva.  PAF:  currently sinus rhythm, not on rate control agent or antiarrythmic meds.  Last dose xarelto given at 9pm on 11/24 She is  currently on  heparin drip , will probably hold before the procedure in am.   H/o Aortic stenosis: stable, per cardiology office note  Dysphagia: RN report patient choking on fluids, cxr on admission no infiltrate  will add thickener, aspiration precaution, swallow eval ordered.  Code Status: DNR  Family Communication: patient   Disposition Plan: remain inpatient, return to [facility] eventually   Consultants:  LBGI  General surgery  Procedures:  TBD  Antibiotics:  vanc from admission to 11/25  Cefepime from admission til 11/27  unasyn 11/27.    Objective: BP (!) 138/53 (BP Location: Left Arm)   Pulse 66   Temp 98.8 F (37.1 C)   Resp 18   Ht '5\' 7"'$  (1.702 m)   Wt 73.9 kg (163 lb)   SpO2 99%   BMI 25.53 kg/m   Intake/Output Summary (Last 24 hours) at 02/28/16 1830 Last data filed at 02/28/16 1800  Gross per 24 hour  Intake             1511 ml  Output                0 ml  Net             1511 ml   Filed Weights   02/24/16 1247  Weight: 73.9 kg (163 lb)    Exam:   General:  NAD, hard of hearing  Cardiovascular: RRR, + systolic murmur 2/6 best heard at right upper sternal border  Respiratory: no wheezing, air entry fair.   Abdomen: Soft/ND/NT, positive BS  Musculoskeletal: trace lower extremity Edema  Neuro: aaox3, impaired  memory , likely at baseline  Data Reviewed: Basic Metabolic Panel:  Recent Labs Lab 02/24/16 1224 02/25/16 0518 02/26/16 0450 02/27/16 0159 02/28/16 0448  NA 142 142 140 139 145  K 4.3 4.5 4.3 3.4* 4.6  CL 95* 98* 97* 97* 105  CO2 39* 36* 38* 36* 32  GLUCOSE 125* 114* 128* 89 65  BUN 17 18 29* 25* 19  CREATININE 1.05* 1.04* 1.20* 1.03* 0.90  CALCIUM 9.4 8.8* 8.6* 8.3* 8.3*   Liver Function Tests:  Recent Labs Lab 02/24/16 1901 02/25/16 0518 02/26/16 0450 02/27/16 0159 02/28/16 0448  AST 601* 434* 190* 89* 42*  ALT 390* 364* 268* 198* 131*  ALKPHOS 141* 158* 148* 132* 132*  BILITOT 2.6* 3.2* 0.9 0.9 0.8   PROT 6.8 6.9 6.6 6.4* 6.1*  ALBUMIN 3.5 3.2* 2.9* 3.1* 3.0*    Recent Labs Lab 02/24/16 1901  LIPASE 20   No results for input(s): AMMONIA in the last 168 hours. CBC:  Recent Labs Lab 02/24/16 1224 02/25/16 0518 02/26/16 0450 02/27/16 0159 02/28/16 0448  WBC 15.0* 11.3* 10.4 8.7 6.1  NEUTROABS 13.6* 10.6*  --   --   --   HGB 9.1* 8.5* 7.9* 8.1* 8.7*  HCT 31.2* 29.4* 26.6* 27.2* 30.0*  MCV 91.0 90.5 89.0 86.9 91.7  PLT 322 304 308 308 318   Cardiac Enzymes:   No results for input(s): CKTOTAL, CKMB, CKMBINDEX, TROPONINI in the last 168 hours. BNP (last 3 results)  Recent Labs  02/16/16 1850  BNP 109.8*    ProBNP (last 3 results) No results for input(s): PROBNP in the last 8760 hours.  CBG: No results for input(s): GLUCAP in the last 168 hours.  Recent Results (from the past 240 hour(s))  Culture, blood (routine x 2) Call MD if unable to obtain prior to antibiotics being given     Status: None (Preliminary result)   Collection Time: 02/24/16  8:56 PM  Result Value Ref Range Status   Specimen Description BLOOD LEFT ARM  Final   Special Requests IN PEDIATRIC BOTTLE 2 CC  Final   Culture   Final    NO GROWTH 4 DAYS Performed at [facility]    Report Status PENDING  Incomplete  Culture, blood (routine x 2) Call MD if unable to obtain prior to antibiotics being given     Status: None (Preliminary result)   Collection Time: 02/24/16  8:56 PM  Result Value Ref Range Status   Specimen Description BLOOD LEFT ARM  Final   Special Requests IN PEDIATRIC BOTTLE 2 CC  Final   Culture   Final    NO GROWTH 4 DAYS Performed at [facility]    Report Status PENDING  Incomplete  MRSA PCR Screening     Status: None   Collection Time: 02/24/16  9:25 PM  Result Value Ref Range Status   MRSA by PCR NEGATIVE NEGATIVE Final    Comment:        The GeneXpert MRSA Assay (FDA approved for NASAL specimens only), is one component of a comprehensive MRSA  colonization surveillance program. It is not intended to diagnose MRSA infection nor to guide or monitor treatment for MRSA infections.      Studies: Mr 3d Recon At Scanner  Result Date: 02/27/2016 CLINICAL DATA:  Abnormal LFTs EXAM: MRI ABDOMEN WITHOUT AND WITH CONTRAST (INCLUDING MRCP) TECHNIQUE: Multiplanar multisequence MR imaging of the abdomen was performed both before and after the administration of intravenous contrast. Heavily T2-weighted images  of the biliary and pancreatic ducts were obtained, and three-dimensional MRCP images were rendered by post processing. CONTRAST:  87m MULTIHANCE GADOBENATE DIMEGLUMINE 529 MG/ML IV SOLN COMPARISON:  CT 02/25/2016, ultrasound 02/24/2016 FINDINGS: Lower chest: Small bilateral pleural effusions left greater than right are visualized. Probable atelectasis at the posterior lung bases and within the lingula. Increased T2 signal in the pericardium is felt secondary to inhomogeneous fat suppression. Hepatobiliary: Signal intensity is within normal limits. No focal hepatic abnormality is visualized. No focal contrast-enhancing lesions. Mild central intra hepatic biliary dilatation. No discrete filling defects are visualized within the slightly dilated intra hepatic ducts. Extrahepatic common bile duct is dilated up to 9 mm. Best seen on the axial T2 fat suppressed images is a 3 mm hypo intensity within the distal common bile duct at the head of the pancreas, series 4, image number 30. No additional filling defects are visualized along the course of the common bile duct. No focal filling defects within the imaged gallbladder. No surrounding gallbladder edema. No ductal enhancement. Pancreas: No enlargement of the pancreatic duct. No peripancreatic fluid collections. Spleen:  Normal in size.  No focal abnormalities. Adrenals/Urinary Tract: Adrenal glands are within normal limits. Mild perinephric edema, nonspecific. No focal renal lesions are visualized.  Stomach/Bowel: Limited evaluation by MRI. Small moderate hiatal hernia. No grossly dilated bowel in the upper abdomen. No bowel wall edema. Vascular/Lymphatic: Imaged aorta within normal limits for size. No abnormally enlarged lymph nodes. Other: Small amount of free fluid around the spleen. Small amount of subcutaneous edema within the lateral abdominal wall and posterior subcutaneous soft tissues. Musculoskeletal: No significant marrow edema. IMPRESSION: 1. Mild central intra hepatic biliary dilatation and mild dilatation of extrahepatic common bile duct, measuring up to 9 mm. Tiny 3 mm hypo intense filling defect within the distal common bile duct at the head of the pancreas, could reflect a small stone. Otherwise no additional hypo intense filling defects, debris, or abnormal ductal enhancement identified. 2. Small bilateral pleural effusions. Small amount of free fluid in the upper abdomen. Electronically Signed   By: KDonavan FoilM.D.   On: 02/27/2016 19:57   Mr Abdomen Mrcp WMoise BoringContast  Result Date: 02/27/2016 CLINICAL DATA:  Abnormal LFTs EXAM: MRI ABDOMEN WITHOUT AND WITH CONTRAST (INCLUDING MRCP) TECHNIQUE: Multiplanar multisequence MR imaging of the abdomen was performed both before and after the administration of intravenous contrast. Heavily T2-weighted images of the biliary and pancreatic ducts were obtained, and three-dimensional MRCP images were rendered by post processing. CONTRAST:  120mMULTIHANCE GADOBENATE DIMEGLUMINE 529 MG/ML IV SOLN COMPARISON:  CT 02/25/2016, ultrasound 02/24/2016 FINDINGS: Lower chest: Small bilateral pleural effusions left greater than right are visualized. Probable atelectasis at the posterior lung bases and within the lingula. Increased T2 signal in the pericardium is felt secondary to inhomogeneous fat suppression. Hepatobiliary: Signal intensity is within normal limits. No focal hepatic abnormality is visualized. No focal contrast-enhancing lesions. Mild central  intra hepatic biliary dilatation. No discrete filling defects are visualized within the slightly dilated intra hepatic ducts. Extrahepatic common bile duct is dilated up to 9 mm. Best seen on the axial T2 fat suppressed images is a 3 mm hypo intensity within the distal common bile duct at the head of the pancreas, series 4, image number 30. No additional filling defects are visualized along the course of the common bile duct. No focal filling defects within the imaged gallbladder. No surrounding gallbladder edema. No ductal enhancement. Pancreas: No enlargement of the pancreatic duct. No peripancreatic  fluid collections. Spleen:  Normal in size.  No focal abnormalities. Adrenals/Urinary Tract: Adrenal glands are within normal limits. Mild perinephric edema, nonspecific. No focal renal lesions are visualized. Stomach/Bowel: Limited evaluation by MRI. Small moderate hiatal hernia. No grossly dilated bowel in the upper abdomen. No bowel wall edema. Vascular/Lymphatic: Imaged aorta within normal limits for size. No abnormally enlarged lymph nodes. Other: Small amount of free fluid around the spleen. Small amount of subcutaneous edema within the lateral abdominal wall and posterior subcutaneous soft tissues. Musculoskeletal: No significant marrow edema. IMPRESSION: 1. Mild central intra hepatic biliary dilatation and mild dilatation of extrahepatic common bile duct, measuring up to 9 mm. Tiny 3 mm hypo intense filling defect within the distal common bile duct at the head of the pancreas, could reflect a small stone. Otherwise no additional hypo intense filling defects, debris, or abnormal ductal enhancement identified. 2. Small bilateral pleural effusions. Small amount of free fluid in the upper abdomen. Electronically Signed   By: Donavan Foil M.D.   On: 02/27/2016 19:57    Scheduled Meds: . ampicillin-sulbactam (UNASYN) IV  3 g Intravenous Q6H  . Chlorhexidine Gluconate Cloth  6 each Topical Once   And  .  Chlorhexidine Gluconate Cloth  6 each Topical Once  . clobetasol cream   Topical 2 times per day on Mon Tue Wed Thu  . fluticasone  1 spray Each Nare Daily  . guaiFENesin  600 mg Oral BID  . hydrocortisone cream   Topical 2 times per day on Mon Tue Wed Thu  . lactose free nutrition  237 mL Oral BID BM  . levalbuterol  0.63 mg Nebulization TID  . pantoprazole  40 mg Oral Daily  . tiotropium  18 mcg Inhalation Daily    Continuous Infusions: . sodium chloride    . sodium chloride 50 mL/hr at 02/28/16 0238  . heparin 800 Units/hr (02/28/16 1404)     Time spent: 33mns  Eriko Economos MD,  [facility] Pager [phone] If 7PM-7AM, please contact night-coverage at [email or web address], password [facility]11/28/2017, 6:30 PM  LOS: 4 days

## 2016-02-29 ENCOUNTER — Inpatient Hospital Stay (HOSPITAL_COMMUNITY): Payer: Medicare Other | Admitting: Certified Registered"

## 2016-02-29 ENCOUNTER — Encounter (HOSPITAL_COMMUNITY)
Admission: EM | Disposition: A | Discharge status: Skilled Nursing Facility | Payer: Self-pay | Source: Home / Self Care | Attending: Internal Medicine

## 2016-02-29 ENCOUNTER — Encounter (HOSPITAL_COMMUNITY): Payer: Self-pay | Admitting: Certified Registered"

## 2016-02-29 ENCOUNTER — Inpatient Hospital Stay (HOSPITAL_COMMUNITY): Payer: Medicare Other

## 2016-02-29 DIAGNOSIS — R945 Abnormal results of liver function studies: Secondary | ICD-10-CM

## 2016-02-29 DIAGNOSIS — K8031 Calculus of bile duct with cholangitis, unspecified, with obstruction: Secondary | ICD-10-CM

## 2016-02-29 DIAGNOSIS — I359 Nonrheumatic aortic valve disorder, unspecified: Secondary | ICD-10-CM

## 2016-02-29 DIAGNOSIS — R7989 Other specified abnormal findings of blood chemistry: Secondary | ICD-10-CM

## 2016-02-29 HISTORY — PX: ERCP: SHX5425

## 2016-02-29 LAB — CBC WITH DIFFERENTIAL/PLATELET
BASOS PCT: 1 %
Basophils Absolute: 0.1 10*3/uL (ref 0.0–0.1)
EOS ABS: 0.4 10*3/uL (ref 0.0–0.7)
Eosinophils Relative: 6 %
HCT: 29.5 % — ABNORMAL LOW (ref 36.0–46.0)
Hemoglobin: 8.6 g/dL — ABNORMAL LOW (ref 12.0–15.0)
Lymphocytes Relative: 23 %
Lymphs Abs: 1.3 10*3/uL (ref 0.7–4.0)
MCH: 26.4 pg (ref 26.0–34.0)
MCHC: 29.2 g/dL — AB (ref 30.0–36.0)
MCV: 90.5 fL (ref 78.0–100.0)
MONO ABS: 0.7 10*3/uL (ref 0.1–1.0)
MONOS PCT: 12 %
Neutro Abs: 3.4 10*3/uL (ref 1.7–7.7)
Neutrophils Relative %: 58 %
PLATELETS: 308 10*3/uL (ref 150–400)
RBC: 3.26 MIL/uL — ABNORMAL LOW (ref 3.87–5.11)
RDW: 14.7 % (ref 11.5–15.5)
WBC: 5.9 10*3/uL (ref 4.0–10.5)

## 2016-02-29 LAB — CULTURE, BLOOD (ROUTINE X 2)
Culture: NO GROWTH
Culture: NO GROWTH

## 2016-02-29 LAB — COMPREHENSIVE METABOLIC PANEL
ALK PHOS: 144 U/L — AB (ref 38–126)
ALT: 104 U/L — AB (ref 14–54)
AST: 49 U/L — ABNORMAL HIGH (ref 15–41)
Albumin: 2.8 g/dL — ABNORMAL LOW (ref 3.5–5.0)
Anion gap: 7 (ref 5–15)
BILIRUBIN TOTAL: 0.4 mg/dL (ref 0.3–1.2)
BUN: 16 mg/dL (ref 6–20)
CALCIUM: 8 mg/dL — AB (ref 8.9–10.3)
CO2: 33 mmol/L — AB (ref 22–32)
CREATININE: 0.94 mg/dL (ref 0.44–1.00)
Chloride: 101 mmol/L (ref 101–111)
GFR calc non Af Amer: 56 mL/min — ABNORMAL LOW (ref >60)
GLUCOSE: 90 mg/dL (ref 65–99)
Potassium: 4 mmol/L (ref 3.5–5.1)
SODIUM: 141 mmol/L (ref 135–145)
TOTAL PROTEIN: 5.8 g/dL — AB (ref 6.5–8.1)

## 2016-02-29 LAB — HEPARIN LEVEL (UNFRACTIONATED): Heparin Unfractionated: 0.36 IU/mL (ref 0.30–0.70)

## 2016-02-29 LAB — MAGNESIUM: MAGNESIUM: 1.8 mg/dL (ref 1.7–2.4)

## 2016-02-29 SURGERY — ERCP, WITH INTERVENTION IF INDICATED
Anesthesia: General

## 2016-02-29 MED ORDER — PROPOFOL 10 MG/ML IV BOLUS
INTRAVENOUS | Status: DC | PRN
  Administered 2016-02-29: 120 mg via INTRAVENOUS

## 2016-02-29 MED ORDER — SUGAMMADEX SODIUM 500 MG/5ML IV SOLN
INTRAVENOUS | Status: AC
  Filled 2016-02-29: qty 5

## 2016-02-29 MED ORDER — PROPOFOL 10 MG/ML IV BOLUS
INTRAVENOUS | Status: AC
  Filled 2016-02-29: qty 20

## 2016-02-29 MED ORDER — LIDOCAINE 2% (20 MG/ML) 5 ML SYRINGE
INTRAMUSCULAR | Status: AC
  Filled 2016-02-29: qty 5

## 2016-02-29 MED ORDER — LIDOCAINE 2% (20 MG/ML) 5 ML SYRINGE
INTRAMUSCULAR | Status: DC | PRN
  Administered 2016-02-29: 50 mg via INTRAVENOUS

## 2016-02-29 MED ORDER — INDOMETHACIN 50 MG RE SUPP
RECTAL | Status: DC | PRN
  Administered 2016-02-29: 100 mg via RECTAL

## 2016-02-29 MED ORDER — ROCURONIUM BROMIDE 10 MG/ML (PF) SYRINGE
PREFILLED_SYRINGE | INTRAVENOUS | Status: DC | PRN
Start: 2016-02-29 — End: 2016-02-29
  Administered 2016-02-29: 30 mg via INTRAVENOUS

## 2016-02-29 MED ORDER — SUCCINYLCHOLINE CHLORIDE 200 MG/10ML IV SOSY
PREFILLED_SYRINGE | INTRAVENOUS | Status: DC | PRN
  Administered 2016-02-29: 80 mg via INTRAVENOUS

## 2016-02-29 MED ORDER — GLUCAGON HCL RDNA (DIAGNOSTIC) 1 MG IJ SOLR
INTRAMUSCULAR | Status: AC
  Filled 2016-02-29: qty 1

## 2016-02-29 MED ORDER — INDOMETHACIN 50 MG RE SUPP
RECTAL | Status: AC
  Filled 2016-02-29: qty 2

## 2016-02-29 MED ORDER — FENTANYL CITRATE (PF) 100 MCG/2ML IJ SOLN
INTRAMUSCULAR | Status: DC | PRN
  Administered 2016-02-29: 50 ug via INTRAVENOUS

## 2016-02-29 MED ORDER — FENTANYL CITRATE (PF) 100 MCG/2ML IJ SOLN
INTRAMUSCULAR | Status: AC
  Filled 2016-02-29: qty 2

## 2016-02-29 MED ORDER — SUGAMMADEX SODIUM 200 MG/2ML IV SOLN
INTRAVENOUS | Status: DC | PRN
  Administered 2016-02-29: 150 mg via INTRAVENOUS

## 2016-02-29 MED ORDER — SODIUM CHLORIDE 0.9 % IV SOLN
INTRAVENOUS | Status: DC | PRN
  Administered 2016-02-29: 30 mL

## 2016-02-29 MED ORDER — ROCURONIUM BROMIDE 50 MG/5ML IV SOSY
PREFILLED_SYRINGE | INTRAVENOUS | Status: AC
  Filled 2016-02-29: qty 5

## 2016-02-29 MED ORDER — SUCCINYLCHOLINE CHLORIDE 200 MG/10ML IV SOSY
PREFILLED_SYRINGE | INTRAVENOUS | Status: AC
  Filled 2016-02-29: qty 10

## 2016-02-29 MED ORDER — GLUCAGON HCL RDNA (DIAGNOSTIC) 1 MG IJ SOLR
INTRAMUSCULAR | Status: DC | PRN
  Administered 2016-02-29 (×2): .5 mg via INTRAVENOUS

## 2016-02-29 NOTE — Progress Notes (Signed)
CSW assisting with d/c planning. [facility] requesting PT eval prior to d/c. RNCM will assist. If pt requires ST Rehab [facility] can assist. CSW will continue to follow to assist with d/c planning needs.  Werner Lean LCSW [phone]

## 2016-02-29 NOTE — Anesthesia Postprocedure Evaluation (Signed)
Anesthesia Post Note  Patient: Paula Contreras  Procedure(s) Performed: Procedure(s) (LRB): ENDOSCOPIC RETROGRADE CHOLANGIOPANCREATOGRAPHY (ERCP) (N/A)  Patient location during evaluation: PACU Anesthesia Type: General Level of consciousness: awake and alert Pain management: pain level controlled Vital Signs Assessment: post-procedure vital signs reviewed and stable Respiratory status: spontaneous breathing, nonlabored ventilation, respiratory function stable and patient connected to nasal cannula oxygen Cardiovascular status: blood pressure returned to baseline and stable Postop Assessment: no signs of nausea or vomiting Anesthetic complications: no    Last Vitals:  Vitals:   02/29/16 1400 02/29/16 1456  BP: (!) 168/72 (!) 149/64  Pulse: (!) 57 (!) 58  Resp: 11 18  Temp:  36.6 C    Last Pain:  Vitals:   02/29/16 1456  TempSrc: Oral  PainSc:                  Montez Hageman

## 2016-02-29 NOTE — Transfer of Care (Signed)
Immediate Anesthesia Transfer of Care Note  Patient: Paula Contreras  Procedure(s) Performed: Procedure(s): ENDOSCOPIC RETROGRADE CHOLANGIOPANCREATOGRAPHY (ERCP) (N/A)  Patient Location: PACU  Anesthesia Type:General  Level of Consciousness: awake, alert  and oriented  Airway & Oxygen Therapy: Patient Spontanous Breathing and Patient connected to face mask oxygen  Post-op Assessment: Report given to RN and Post -op Vital signs reviewed and stable  Post vital signs: Reviewed and stable  Last Vitals:  Vitals:   02/29/16 1350 02/29/16 1400  BP: (!) 171/56   Pulse: (!) 58 (!) 57  Resp: 11 11  Temp:      Last Pain:  Vitals:   02/29/16 1350  TempSrc: Oral  PainSc:          Complications: No apparent anesthesia complications

## 2016-02-29 NOTE — Progress Notes (Signed)
Patient for ERCP today around noon. Currently sleeping. Her heparin gtt is off. Vitals stable. Labs reviewed, LFTs nearly normal now.

## 2016-02-29 NOTE — Interval H&P Note (Signed)
History and Physical Interval Note:  02/29/2016 11:30 AM  Paula Contreras  has presented today for surgery, with the diagnosis of cholangitis, choledocholithiasis.  The various methods of treatment have been discussed with the patient and family. After consideration of risks, benefits and other options for treatment, the patient has consented to  Procedure(s): ENDOSCOPIC RETROGRADE CHOLANGIOPANCREATOGRAPHY (ERCP) (N/A) as a surgical intervention .  The patient's history has been reviewed, patient examined, no change in status, stable for surgery.  I have reviewed the patient's chart and labs.  Questions were answered to the patient's satisfaction.     Scarlette Shorts

## 2016-02-29 NOTE — Progress Notes (Signed)
PROGRESS NOTE    Paula Contreras  DJS:[id number] DOB: 07-17-1933 DOA: 02/24/2016 PCP: Vena Austria, MD    Assessment & Plan:   Principal Problem:   Ascending cholangitis due to choledocolithiasis Active Problems:   Hypertension   COPD (chronic obstructive pulmonary disease) (HCC)   Aortic valve disorder   Chronic respiratory failure (HCC)   PAF (paroxysmal atrial fibrillation) (HCC)   CKD (chronic kidney disease), stage III   HCAP (healthcare-associated pneumonia)   LFTs abnormal   Epigastric pain   Nausea and vomiting   Bile duct stone   Chronic anticoagulation - Xerelto  #1 choledocholithiasis with ascending cholangitis Status post ERCP with sphincterotomy and common bile duct stone extraction per Dr. Henrene Pastor. Hold anticoagulation for at least one week per GI recommendations. Patient started on a full liquid diet. Continue empiric IV antibiotics. GI following. General surgery also following and will discuss with patient and family as to whether patient may proceed with a cholecystectomy versus conservative treatment during this hospitalization.  #2 hypokalemia Repleted.  #3 acute kidney injury Improved with hydration.  #4 paroxysmal atrial fibrillation  Currently rate controlled. Anticoagulation on hold for at least one week per GI recommendations as patient would recent sphincterotomy.  #5 COPD/chronic respiratory failure on home O2 Stable. Continue current nebulizer treatments, Spiriva, Flonase, Mucinex.  #6 history of aortic stenosis Stable. Outpatient follow-up.  #7 dysphagia  Speech Therapy evaluation pending.   DVT prophylaxis: SCDs Code Status: DO NOT RESUSCITATE Family Communication: No family at bedside. Disposition Plan:  When medically stable with clinical improvement tolerating oral intake and oral pills and per general surgery and GI.  Consultants:   Gen. surgery: Dr. Johney Maine 02/25/2016  Gastroenterology: Dr. Havery Moros  02/25/2016  Procedures:   CT abdomen and pelvis 02/25/2016  Chest x-ray 02/24/2016  MRCP 02/27/2016  Right upper quadrant ultrasound 02/24/2016  ERCP with sphincterotomy and common duct stone extraction per Dr. Henrene Pastor 02/29/2016  Antimicrobials:   IV Unasyn 02/27/2016  IV cefepime 02/24/2016>>>> 02/27/2016  IV vancomycin 02/24/2016>>>> 02/25/2016   Subjective: Patient status post ERCP. Patient sedated and drowsy in the PACU.  Objective: Vitals:   02/29/16 1350 02/29/16 1400 02/29/16 1420 02/29/16 1456  BP: (!) 171/56 (!) 168/72  (!) 149/64  Pulse: (!) 58 (!) 57  (!) 58  Resp: '11 11  18  '$ Temp: 98.1 F (36.7 C)   97.8 F (36.6 C)  TempSrc: Oral   Oral  SpO2: 100% 100% 93%   Weight:      Height:        Intake/Output Summary (Last 24 hours) at 02/29/16 1516 Last data filed at 02/29/16 1402  Gross per 24 hour  Intake          2382.14 ml  Output                1 ml  Net          2381.14 ml   Filed Weights   02/24/16 1247  Weight: 73.9 kg (163 lb)    Examination:  General exam: Drowsy and sedated. In the PACU. Respiratory system: Clear to auscultation anterior lung fields . Respiratory effort normal. Cardiovascular system: S1 & S2 heard, RRR. No JVD, murmurs, rubs, gallops or clicks. No pedal edema. Gastrointestinal system: Abdomen is nondistended, soft and nontender. No organomegaly or masses felt. Normal bowel sounds heard. Central nervous system: Alert and oriented. No focal neurological deficits. Extremities: Symmetric 5 x 5 power. Skin: No rashes, lesions or ulcers Psychiatry:  unable to assess as  patient drowsy.     Data Reviewed: I have personally reviewed following labs and imaging studies  CBC:  Recent Labs Lab 02/24/16 1224 02/25/16 0518 02/26/16 0450 02/27/16 0159 02/28/16 0448 02/29/16 0458  WBC 15.0* 11.3* 10.4 8.7 6.1 5.9  NEUTROABS 13.6* 10.6*  --   --   --  3.4  HGB 9.1* 8.5* 7.9* 8.1* 8.7* 8.6*  HCT 31.2* 29.4* 26.6* 27.2*  30.0* 29.5*  MCV 91.0 90.5 89.0 86.9 91.7 90.5  PLT 322 304 308 308 318 132   Basic Metabolic Panel:  Recent Labs Lab 02/25/16 0518 02/26/16 0450 02/27/16 0159 02/28/16 0448 02/29/16 0458  NA 142 140 139 145 141  K 4.5 4.3 3.4* 4.6 4.0  CL 98* 97* 97* 105 101  CO2 36* 38* 36* 32 33*  GLUCOSE 114* 128* 89 65 90  BUN 18 29* 25* 19 16  CREATININE 1.04* 1.20* 1.03* 0.90 0.94  CALCIUM 8.8* 8.6* 8.3* 8.3* 8.0*  MG  --   --   --   --  1.8   GFR: Estimated Creatinine Clearance: 46.4 mL/min (by C-G formula based on SCr of 0.94 mg/dL). Liver Function Tests:  Recent Labs Lab 02/25/16 0518 02/26/16 0450 02/27/16 0159 02/28/16 0448 02/29/16 0458  AST 434* 190* 89* 42* 49*  ALT 364* 268* 198* 131* 104*  ALKPHOS 158* 148* 132* 132* 144*  BILITOT 3.2* 0.9 0.9 0.8 0.4  PROT 6.9 6.6 6.4* 6.1* 5.8*  ALBUMIN 3.2* 2.9* 3.1* 3.0* 2.8*    Recent Labs Lab 02/24/16 1901  LIPASE 20   No results for input(s): AMMONIA in the last 168 hours. Coagulation Profile:  Recent Labs Lab 02/24/16 1901  INR 1.64   Cardiac Enzymes: No results for input(s): CKTOTAL, CKMB, CKMBINDEX, TROPONINI in the last 168 hours. BNP (last 3 results) No results for input(s): PROBNP in the last 8760 hours. HbA1C: No results for input(s): HGBA1C in the last 72 hours. CBG: No results for input(s): GLUCAP in the last 168 hours. Lipid Profile: No results for input(s): CHOL, HDL, LDLCALC, TRIG, CHOLHDL, LDLDIRECT in the last 72 hours. Thyroid Function Tests: No results for input(s): TSH, T4TOTAL, FREET4, T3FREE, THYROIDAB in the last 72 hours. Anemia Panel: No results for input(s): VITAMINB12, FOLATE, FERRITIN, TIBC, IRON, RETICCTPCT in the last 72 hours. Sepsis Labs:  Recent Labs Lab 02/24/16 1254 02/24/16 1525  LATICACIDVEN 1.12 0.72    Recent Results (from the past 240 hour(s))  Culture, blood (routine x 2) Call MD if unable to obtain prior to antibiotics being given     Status: None    Collection Time: 02/24/16  8:56 PM  Result Value Ref Range Status   Specimen Description BLOOD LEFT ARM  Final   Special Requests IN PEDIATRIC BOTTLE 2 CC  Final   Culture   Final    NO GROWTH 5 DAYS Performed at [facility]    Report Status 02/29/2016 FINAL  Final  Culture, blood (routine x 2) Call MD if unable to obtain prior to antibiotics being given     Status: None   Collection Time: 02/24/16  8:56 PM  Result Value Ref Range Status   Specimen Description BLOOD LEFT ARM  Final   Special Requests IN PEDIATRIC BOTTLE 2 CC  Final   Culture   Final    NO GROWTH 5 DAYS Performed at [facility]    Report Status 02/29/2016 FINAL  Final  MRSA PCR Screening     Status: None   Collection  Time: 02/24/16  9:25 PM  Result Value Ref Range Status   MRSA by PCR NEGATIVE NEGATIVE Final    Comment:        The GeneXpert MRSA Assay (FDA approved for NASAL specimens only), is one component of a comprehensive MRSA colonization surveillance program. It is not intended to diagnose MRSA infection nor to guide or monitor treatment for MRSA infections.          Radiology Studies: Mr 3d Recon At Scanner  Result Date: 02/27/2016 CLINICAL DATA:  Abnormal LFTs EXAM: MRI ABDOMEN WITHOUT AND WITH CONTRAST (INCLUDING MRCP) TECHNIQUE: Multiplanar multisequence MR imaging of the abdomen was performed both before and after the administration of intravenous contrast. Heavily T2-weighted images of the biliary and pancreatic ducts were obtained, and three-dimensional MRCP images were rendered by post processing. CONTRAST:  62m MULTIHANCE GADOBENATE DIMEGLUMINE 529 MG/ML IV SOLN COMPARISON:  CT 02/25/2016, ultrasound 02/24/2016 FINDINGS: Lower chest: Small bilateral pleural effusions left greater than right are visualized. Probable atelectasis at the posterior lung bases and within the lingula. Increased T2 signal in the pericardium is felt secondary to inhomogeneous fat suppression.  Hepatobiliary: Signal intensity is within normal limits. No focal hepatic abnormality is visualized. No focal contrast-enhancing lesions. Mild central intra hepatic biliary dilatation. No discrete filling defects are visualized within the slightly dilated intra hepatic ducts. Extrahepatic common bile duct is dilated up to 9 mm. Best seen on the axial T2 fat suppressed images is a 3 mm hypo intensity within the distal common bile duct at the head of the pancreas, series 4, image number 30. No additional filling defects are visualized along the course of the common bile duct. No focal filling defects within the imaged gallbladder. No surrounding gallbladder edema. No ductal enhancement. Pancreas: No enlargement of the pancreatic duct. No peripancreatic fluid collections. Spleen:  Normal in size.  No focal abnormalities. Adrenals/Urinary Tract: Adrenal glands are within normal limits. Mild perinephric edema, nonspecific. No focal renal lesions are visualized. Stomach/Bowel: Limited evaluation by MRI. Small moderate hiatal hernia. No grossly dilated bowel in the upper abdomen. No bowel wall edema. Vascular/Lymphatic: Imaged aorta within normal limits for size. No abnormally enlarged lymph nodes. Other: Small amount of free fluid around the spleen. Small amount of subcutaneous edema within the lateral abdominal wall and posterior subcutaneous soft tissues. Musculoskeletal: No significant marrow edema. IMPRESSION: 1. Mild central intra hepatic biliary dilatation and mild dilatation of extrahepatic common bile duct, measuring up to 9 mm. Tiny 3 mm hypo intense filling defect within the distal common bile duct at the head of the pancreas, could reflect a small stone. Otherwise no additional hypo intense filling defects, debris, or abnormal ductal enhancement identified. 2. Small bilateral pleural effusions. Small amount of free fluid in the upper abdomen. Electronically Signed   By: KDonavan FoilM.D.   On: 02/27/2016 19:57    Dg Ercp Biliary & Pancreatic Ducts  Result Date: 02/29/2016 CLINICAL DATA:  ERCP with sphincterotomy for stone removal. EXAM: ERCP TECHNIQUE: Multiple spot images obtained with the fluoroscopic device and submitted for interpretation post-procedure. FLUOROSCOPY TIME:  Fluoroscopy Time:  3 minutes and 44 seconds Radiation Exposure Index (if provided by the fluoroscopic device): 52.53 mGy COMPARISON:  MRI 02/27/2016 FINDINGS: First image demonstrates opacification of the main pancreatic duct. Cannulation and opacification of the biliary system. Mild dilatation of the biliary system. No large filling defects or stones are identified. IMPRESSION: ERCP images for stone removal. Mild dilatation of the biliary system. These images were submitted for  radiologic interpretation only. Please see the procedural report for the amount of contrast and the fluoroscopy time utilized. Electronically Signed   By: Markus Daft M.D.   On: 02/29/2016 13:48   Mr Abdomen Mrcp Moise Boring Contast  Result Date: 02/27/2016 CLINICAL DATA:  Abnormal LFTs EXAM: MRI ABDOMEN WITHOUT AND WITH CONTRAST (INCLUDING MRCP) TECHNIQUE: Multiplanar multisequence MR imaging of the abdomen was performed both before and after the administration of intravenous contrast. Heavily T2-weighted images of the biliary and pancreatic ducts were obtained, and three-dimensional MRCP images were rendered by post processing. CONTRAST:  33m MULTIHANCE GADOBENATE DIMEGLUMINE 529 MG/ML IV SOLN COMPARISON:  CT 02/25/2016, ultrasound 02/24/2016 FINDINGS: Lower chest: Small bilateral pleural effusions left greater than right are visualized. Probable atelectasis at the posterior lung bases and within the lingula. Increased T2 signal in the pericardium is felt secondary to inhomogeneous fat suppression. Hepatobiliary: Signal intensity is within normal limits. No focal hepatic abnormality is visualized. No focal contrast-enhancing lesions. Mild central intra hepatic biliary  dilatation. No discrete filling defects are visualized within the slightly dilated intra hepatic ducts. Extrahepatic common bile duct is dilated up to 9 mm. Best seen on the axial T2 fat suppressed images is a 3 mm hypo intensity within the distal common bile duct at the head of the pancreas, series 4, image number 30. No additional filling defects are visualized along the course of the common bile duct. No focal filling defects within the imaged gallbladder. No surrounding gallbladder edema. No ductal enhancement. Pancreas: No enlargement of the pancreatic duct. No peripancreatic fluid collections. Spleen:  Normal in size.  No focal abnormalities. Adrenals/Urinary Tract: Adrenal glands are within normal limits. Mild perinephric edema, nonspecific. No focal renal lesions are visualized. Stomach/Bowel: Limited evaluation by MRI. Small moderate hiatal hernia. No grossly dilated bowel in the upper abdomen. No bowel wall edema. Vascular/Lymphatic: Imaged aorta within normal limits for size. No abnormally enlarged lymph nodes. Other: Small amount of free fluid around the spleen. Small amount of subcutaneous edema within the lateral abdominal wall and posterior subcutaneous soft tissues. Musculoskeletal: No significant marrow edema. IMPRESSION: 1. Mild central intra hepatic biliary dilatation and mild dilatation of extrahepatic common bile duct, measuring up to 9 mm. Tiny 3 mm hypo intense filling defect within the distal common bile duct at the head of the pancreas, could reflect a small stone. Otherwise no additional hypo intense filling defects, debris, or abnormal ductal enhancement identified. 2. Small bilateral pleural effusions. Small amount of free fluid in the upper abdomen. Electronically Signed   By: KDonavan FoilM.D.   On: 02/27/2016 19:57        Scheduled Meds: . ampicillin-sulbactam (UNASYN) IV  3 g Intravenous Q6H  . clobetasol cream   Topical 2 times per day on Mon Tue Wed Thu  . fluticasone  1  spray Each Nare Daily  . guaiFENesin  600 mg Oral BID  . hydrocortisone cream   Topical 2 times per day on Mon Tue Wed Thu  . lactose free nutrition  237 mL Oral BID BM  . levalbuterol  0.63 mg Nebulization TID  . pantoprazole  40 mg Oral Daily  . tiotropium  18 mcg Inhalation Daily   Continuous Infusions: . sodium chloride 1,000 mL (02/28/16 2330)     LOS: 5 days    Time spent: 315minutes   Nilan Iddings, MD [facility] Pager [phone] If 7PM-7AM, please contact night-coverage [email or web address] Password [email or web address] 02/29/2016, 3:16 PM

## 2016-02-29 NOTE — Anesthesia Procedure Notes (Signed)
Procedure Name: Intubation Date/Time: 02/29/2016 12:26 PM Performed by: Noralyn Pick D Pre-anesthesia Checklist: Patient identified, Emergency Drugs available, Suction available and Patient being monitored Patient Re-evaluated:Patient Re-evaluated prior to inductionOxygen Delivery Method: Circle system utilized Preoxygenation: Pre-oxygenation with 100% oxygen Intubation Type: IV induction Ventilation: Mask ventilation without difficulty Laryngoscope Size: Mac and 3 Grade View: Grade I Tube type: Oral Tube size: 7.5 mm Number of attempts: 1 Airway Equipment and Method: Stylet Placement Confirmation: ETT inserted through vocal cords under direct vision,  positive ETCO2 and breath sounds checked- equal and bilateral Secured at: 21 cm Tube secured with: Tape Dental Injury: Teeth and Oropharynx as per pre-operative assessment

## 2016-02-29 NOTE — Op Note (Signed)
[facility] Patient Name: Paula Contreras Procedure Date: 02/29/2016 MRN: [id number] Attending MD: Docia Chuck. Henrene Pastor , MD Date of Birth: [birth year] CSN: [id number] Age: 82 Admit Type: Inpatient Procedure:                ERCP, with biliary sphincterotomy and common duct                            stone extraction Indications:              Suspected bile duct stone(s), on imaging.                            Clinically presented with ascending cholangitis and                            has gallstones Providers:                Docia Chuck. Henrene Pastor, MD, Cleda Daub, RN, Elspeth Cho Tech., Technician, [facility],                            CRNA Referring MD:              Medicines:                General Anesthesia Complications:            No immediate complications. Estimated Blood Loss:     Estimated blood loss: none. Procedure:                Pre-Anesthesia Assessment:                           - Prior to the procedure, a History and Physical                            was performed, and patient medications and                            allergies were reviewed. The patient is competent.                            The risks and benefits of the procedure and the                            sedation options and risks were discussed with the                            patient. All questions were answered and informed                            consent was obtained. Patient identification and                            proposed procedure were verified by the  physician.                            Mental Status Examination: alert and oriented.                            Airway Examination: normal oropharyngeal airway and                            neck mobility. Respiratory Examination: clear to                            auscultation. CV Examination: normal. Prophylactic                            Antibiotics: The patient does not require                       prophylactic antibiotics. Prior Anticoagulants: The                            patient has taken Lovenox (enoxaparin), last dose                            was day of procedure. ASA Grade Assessment: III - A                            patient with severe systemic disease. After                            reviewing the risks and benefits, the patient was                            deemed in satisfactory condition to undergo the                            procedure. The anesthesia plan was to use moderate                            sedation / analgesia (conscious sedation).                            Immediately prior to administration of medications,                            the patient was re-assessed for adequacy to receive                            sedatives. The heart rate, respiratory rate, oxygen                            saturations, blood pressure, adequacy of pulmonary                            ventilation, and response to care were  monitored                            throughout the procedure. The physical status of                            the patient was re-assessed after the procedure.                           After obtaining informed consent, the scope was                            passed under direct vision. Throughout the                            procedure, the patient's blood pressure, pulse, and                            oxygen saturations were monitored continuously. The                            Endoscope was introduced through the mouth, and                            used to inject contrast into and used for direct                            visualization of the bile duct and ventral                            pancreatic duct. The ERCP was accomplished without                            difficulty. The patient tolerated the procedure                            well. Scope In: Scope Out: Findings:      The esophagus was difficult to  intubate with side-viewing scope. There       was resistance. A standard endoscope was then used to evaluate. 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 No Zenker's. Esophagus was subsequently intubated       with the side-viewing scope. The scope was advanced to a normal major       papilla in the descending duodenum without detailed examination of the       pharynx, larynx and associated structures, and upper GI tract. The upper       GI tract was grossly normal, except for a sliding hiatal hernia and       periampullary diverticulum that was small. Initial injection of contrast       yielded a partial pancreatogram. The bile duct was subsequently deeply       cannulated with the regular tipped cannula with glidewire. Contrast was       injected. Opacification of the entire biliary tree was  successful. The       lower third of the main bile duct contained one stone, which was 2 mm in       diameter. The lower third of the main bile duct was mildly dilated. The       in the biliary system contained one small 2 mm stone fragment. A 0.035       inch straight standard wire was passed into the biliary tree. Biliary       sphincterotomy was made with a traction (standard) sphincterotome using       ERBE electrocautery. There was no post-sphincterotomy bleeding. The       biliary tree was swept with a 12 mm balloon starting at the bifurcation.       All stones were removed. No stones remained, with excellent drainage. Impression:               1. Choledocholithiasis status post sphincterotomy                            with stone extraction                           2. Normal partial pancreatogram                           3. Incidental small periampullary diverticulum,                            sliding hiatal hernia, and cricopharyngeal bar. Moderate Sedation:      none Recommendation:           1. Full liquid diet.                            2. Hold anticoagulation for one week given                            sphincterotomy                           3. Repeat liver function tests CBC tomorrow                           4. Continue antibiotics                           5. Standard post ERCP observation. Indomethacin                            suppositories given. Procedure Code(s):        --- Professional ---                           [id number], Endoscopic retrograde                            cholangiopancreatography (ERCP); with removal of                            calculi/debris from biliary/pancreatic duct(s)  43262, Endoscopic retrograde                            cholangiopancreatography (ERCP); with                            sphincterotomy/papillotomy Diagnosis Code(s):        --- Professional ---                           K80.51, Calculus of bile duct without cholangitis                            or cholecystitis with obstruction CPT copyright 2016 [employer]. All rights reserved. The codes documented in this report are preliminary and upon coder review may  be revised to meet current compliance requirements. Docia Chuck. Henrene Pastor, MD 02/29/2016 2:01:51 PM This report has been signed electronically. Number of Addenda: 0

## 2016-02-29 NOTE — Anesthesia Preprocedure Evaluation (Signed)
Anesthesia Evaluation  Patient identified by MRN, date of birth, ID band Patient awake    Reviewed: Allergy & Precautions, NPO status , Patient's Chart, lab work & pertinent test results  Airway Mallampati: II  TM Distance: >3 FB Neck ROM: Full    Dental no notable dental hx.    Pulmonary COPD, former smoker,    Pulmonary exam normal breath sounds clear to auscultation       Cardiovascular hypertension, Pt. on medications Normal cardiovascular exam+ dysrhythmias Atrial Fibrillation + Valvular Problems/Murmurs AS  Rhythm:Regular Rate:Normal     Neuro/Psych negative neurological ROS  negative psych ROS   GI/Hepatic negative GI ROS, Neg liver ROS,   Endo/Other  negative endocrine ROS  Renal/GU Renal InsufficiencyRenal disease  negative genitourinary   Musculoskeletal negative musculoskeletal ROS (+)   Abdominal   Peds negative pediatric ROS (+)  Hematology negative hematology ROS (+)   Anesthesia Other Findings   Reproductive/Obstetrics negative OB ROS                             Anesthesia Physical Anesthesia Plan  ASA: III  Anesthesia Plan: General   Post-op Pain Management:    Induction: Intravenous  Airway Management Planned: Oral ETT  Additional Equipment:   Intra-op Plan:   Post-operative Plan: Extubation in OR  Informed Consent: I have reviewed the patients History and Physical, chart, labs and discussed the procedure including the risks, benefits and alternatives for the proposed anesthesia with the patient or authorized representative who has indicated his/her understanding and acceptance.   Dental advisory given  Plan Discussed with: CRNA  Anesthesia Plan Comments:         Anesthesia Quick Evaluation

## 2016-02-29 NOTE — H&P (View-Only) (Signed)
Patient for ERCP today around noon. Currently sleeping. Her heparin gtt is off. Vitals stable. Labs reviewed, LFTs nearly normal now.

## 2016-02-29 NOTE — Progress Notes (Signed)
ANTICOAGULATION CONSULT NOTE - [place] for Heparin Indication: atrial fibrillation, bridge while off xarelto  Allergies  Allergen Reactions  . Codeine Nausea And Vomiting    Patient Measurements: Height: '5\' 7"'$  (170.2 cm) Weight: 163 lb (73.9 kg) IBW/kg (Calculated) : 61.6   Vital Signs: Temp: 97.8 F (36.6 C) (11/29 0551) Temp Source: Oral (11/29 0551) BP: 145/65 (11/29 0551) Pulse Rate: 61 (11/29 0551)  Labs:  Recent Labs  02/27/16 0159 02/27/16 0946 02/28/16 0448 02/29/16 0458  HGB 8.1*  --  8.7*  --   HCT 27.2*  --  30.0*  --   PLT 308  --  318  --   APTT 90* 69* 70*  --   HEPARINUNFRC 1.02* 0.73* 0.67 0.36  CREATININE 1.03*  --  0.90  --     Estimated Creatinine Clearance: 48.5 mL/min (by C-G formula based on SCr of 0.9 mg/dL).  Medications, infusions . sodium chloride    . sodium chloride 1,000 mL (02/28/16 2330)     Assessment: 82 yo F admitted with cholangitis and need for possible GI surgical procedures.  She is chronically on Xarelto for her Afib (CHADS2Vasc=4).  Last dose 11/24 @ 2118.  Pharmacy has been consulted to dose Heparin IV while off Xarelto pending invasive procedures.   Last inpatient Xarelto '20mg'$  dose given 11/24 @ 2100.  Today, 02/29/16 - HL 0.36, decreased to lower end of therapeutic range.     - CBC (last on 11/28): Hgb 8.7 improved (decreased from, 9.1 on admission.  Per GI, no signs of bleeding, likely due to repeated blood draws and fluids). Pltc WNL  - No infusion issues reported per nursing; no bleeding or complications noted - FOBT positive 11/26 - MD aware and continuing heparin at this time  Goal of Therapy:  Heparin level 0.3-0.7 units/ml aPTT 66-102s seconds Monitor platelets by anticoagulation protocol: Yes   Plan:   Heparin drip held at 06:00 AM today prior to planned ERCP  Pharmacy to f/u plans for resuming anticoagulation post procedures  Daily heparin level, CBC   Gretta Arab PharmD, BCPS Pager [phone] 02/29/2016 7:03 AM

## 2016-02-29 NOTE — Progress Notes (Signed)
Day of Surgery  Subjective: Just back from the ERCP.  Denies any pain, comfortable in bed on O2.  Objective: Vital signs in last 24 hours: Temp:  [97.8 F (36.6 C)-98.4 F (36.9 C)] 97.8 F (36.6 C) (11/29 1456) Pulse Rate:  [57-65] 58 (11/29 1456) Resp:  [11-18] 18 (11/29 1456) BP: (137-171)/(56-72) 149/64 (11/29 1456) SpO2:  [93 %-100 %] 98 % (11/29 1520) Last BM Date: 02/25/16  Intake/Output from previous day: 11/28 0701 - 11/29 0700 In: 2318.1 [P.O.:474; I.V.:1444.1; IV Piggyback:400] Out: 0  Intake/Output this shift: Total I/O In: 1203.3 [I.V.:1203.3] Out: 1 [Urine:1] 474 yesterday NPO now Afebrile, VSS Labs stable   physical exam: Alert and comfortable at bed rest.   I did not examine abdomen  Lab Results:   Recent Labs  02/28/16 0448 02/29/16 0458  WBC 6.1 5.9  HGB 8.7* 8.6*  HCT 30.0* 29.5*  PLT 318 308    BMET  Recent Labs  02/28/16 0448 02/29/16 0458  NA 145 141  K 4.6 4.0  CL 105 101  CO2 32 33*  GLUCOSE 65 90  BUN 19 16  CREATININE 0.90 0.94  CALCIUM 8.3* 8.0*   PT/INR No results for input(s): LABPROT, INR in the last 72 hours.   Recent Labs Lab 02/25/16 0518 02/26/16 0450 02/27/16 0159 02/28/16 0448 02/29/16 0458  AST 434* 190* 89* 42* 49*  ALT 364* 268* 198* 131* 104*  ALKPHOS 158* 148* 132* 132* 144*  BILITOT 3.2* 0.9 0.9 0.8 0.4  PROT 6.9 6.6 6.4* 6.1* 5.8*  ALBUMIN 3.2* 2.9* 3.1* 3.0* 2.8*     Lipase     Component Value Date/Time   LIPASE 20 02/24/2016 1901     Studies/Results: Mr 3d Recon At Scanner  Result Date: 02/27/2016 CLINICAL DATA:  Abnormal LFTs EXAM: MRI ABDOMEN WITHOUT AND WITH CONTRAST (INCLUDING MRCP) TECHNIQUE: Multiplanar multisequence MR imaging of the abdomen was performed both before and after the administration of intravenous contrast. Heavily T2-weighted images of the biliary and pancreatic ducts were obtained, and three-dimensional MRCP images were rendered by post processing. CONTRAST:   60m MULTIHANCE GADOBENATE DIMEGLUMINE 529 MG/ML IV SOLN COMPARISON:  CT 02/25/2016, ultrasound 02/24/2016 FINDINGS: Lower chest: Small bilateral pleural effusions left greater than right are visualized. Probable atelectasis at the posterior lung bases and within the lingula. Increased T2 signal in the pericardium is felt secondary to inhomogeneous fat suppression. Hepatobiliary: Signal intensity is within normal limits. No focal hepatic abnormality is visualized. No focal contrast-enhancing lesions. Mild central intra hepatic biliary dilatation. No discrete filling defects are visualized within the slightly dilated intra hepatic ducts. Extrahepatic common bile duct is dilated up to 9 mm. Best seen on the axial T2 fat suppressed images is a 3 mm hypo intensity within the distal common bile duct at the head of the pancreas, series 4, image number 30. No additional filling defects are visualized along the course of the common bile duct. No focal filling defects within the imaged gallbladder. No surrounding gallbladder edema. No ductal enhancement. Pancreas: No enlargement of the pancreatic duct. No peripancreatic fluid collections. Spleen:  Normal in size.  No focal abnormalities. Adrenals/Urinary Tract: Adrenal glands are within normal limits. Mild perinephric edema, nonspecific. No focal renal lesions are visualized. Stomach/Bowel: Limited evaluation by MRI. Small moderate hiatal hernia. No grossly dilated bowel in the upper abdomen. No bowel wall edema. Vascular/Lymphatic: Imaged aorta within normal limits for size. No abnormally enlarged lymph nodes. Other: Small amount of free fluid around the spleen. Small amount  of subcutaneous edema within the lateral abdominal wall and posterior subcutaneous soft tissues. Musculoskeletal: No significant marrow edema. IMPRESSION: 1. Mild central intra hepatic biliary dilatation and mild dilatation of extrahepatic common bile duct, measuring up to 9 mm. Tiny 3 mm hypo intense  filling defect within the distal common bile duct at the head of the pancreas, could reflect a small stone. Otherwise no additional hypo intense filling defects, debris, or abnormal ductal enhancement identified. 2. Small bilateral pleural effusions. Small amount of free fluid in the upper abdomen. Electronically Signed   By: Donavan Foil M.D.   On: 02/27/2016 19:57   Dg Ercp Biliary & Pancreatic Ducts  Result Date: 02/29/2016 CLINICAL DATA:  ERCP with sphincterotomy for stone removal. EXAM: ERCP TECHNIQUE: Multiple spot images obtained with the fluoroscopic device and submitted for interpretation post-procedure. FLUOROSCOPY TIME:  Fluoroscopy Time:  3 minutes and 44 seconds Radiation Exposure Index (if provided by the fluoroscopic device): 52.53 mGy COMPARISON:  MRI 02/27/2016 FINDINGS: First image demonstrates opacification of the main pancreatic duct. Cannulation and opacification of the biliary system. Mild dilatation of the biliary system. No large filling defects or stones are identified. IMPRESSION: ERCP images for stone removal. Mild dilatation of the biliary system. These images were submitted for radiologic interpretation only. Please see the procedural report for the amount of contrast and the fluoroscopy time utilized. Electronically Signed   By: Markus Daft M.D.   On: 02/29/2016 13:48   Mr Abdomen Mrcp Moise Boring Contast  Result Date: 02/27/2016 CLINICAL DATA:  Abnormal LFTs EXAM: MRI ABDOMEN WITHOUT AND WITH CONTRAST (INCLUDING MRCP) TECHNIQUE: Multiplanar multisequence MR imaging of the abdomen was performed both before and after the administration of intravenous contrast. Heavily T2-weighted images of the biliary and pancreatic ducts were obtained, and three-dimensional MRCP images were rendered by post processing. CONTRAST:  79m MULTIHANCE GADOBENATE DIMEGLUMINE 529 MG/ML IV SOLN COMPARISON:  CT 02/25/2016, ultrasound 02/24/2016 FINDINGS: Lower chest: Small bilateral pleural effusions left  greater than right are visualized. Probable atelectasis at the posterior lung bases and within the lingula. Increased T2 signal in the pericardium is felt secondary to inhomogeneous fat suppression. Hepatobiliary: Signal intensity is within normal limits. No focal hepatic abnormality is visualized. No focal contrast-enhancing lesions. Mild central intra hepatic biliary dilatation. No discrete filling defects are visualized within the slightly dilated intra hepatic ducts. Extrahepatic common bile duct is dilated up to 9 mm. Best seen on the axial T2 fat suppressed images is a 3 mm hypo intensity within the distal common bile duct at the head of the pancreas, series 4, image number 30. No additional filling defects are visualized along the course of the common bile duct. No focal filling defects within the imaged gallbladder. No surrounding gallbladder edema. No ductal enhancement. Pancreas: No enlargement of the pancreatic duct. No peripancreatic fluid collections. Spleen:  Normal in size.  No focal abnormalities. Adrenals/Urinary Tract: Adrenal glands are within normal limits. Mild perinephric edema, nonspecific. No focal renal lesions are visualized. Stomach/Bowel: Limited evaluation by MRI. Small moderate hiatal hernia. No grossly dilated bowel in the upper abdomen. No bowel wall edema. Vascular/Lymphatic: Imaged aorta within normal limits for size. No abnormally enlarged lymph nodes. Other: Small amount of free fluid around the spleen. Small amount of subcutaneous edema within the lateral abdominal wall and posterior subcutaneous soft tissues. Musculoskeletal: No significant marrow edema. IMPRESSION: 1. Mild central intra hepatic biliary dilatation and mild dilatation of extrahepatic common bile duct, measuring up to 9 mm. Tiny 3 mm hypo  intense filling defect within the distal common bile duct at the head of the pancreas, could reflect a small stone. Otherwise no additional hypo intense filling defects, debris,  or abnormal ductal enhancement identified. 2. Small bilateral pleural effusions. Small amount of free fluid in the upper abdomen. Electronically Signed   By: Donavan Foil M.D.   On: 02/27/2016 19:57    Medications: . ampicillin-sulbactam (UNASYN) IV  3 g Intravenous Q6H  . clobetasol cream   Topical 2 times per day on Mon Tue Wed Thu  . fluticasone  1 spray Each Nare Daily  . guaiFENesin  600 mg Oral BID  . hydrocortisone cream   Topical 2 times per day on Mon Tue Wed Thu  . lactose free nutrition  237 mL Oral BID BM  . levalbuterol  0.63 mg Nebulization TID  . pantoprazole  40 mg Oral Daily  . tiotropium  18 mcg Inhalation Daily    Assessment/Plan Choledocholithiasis with cholangitis. S/p ERCP, common duct stone extraction, and sphincterotomy, 02/19/16, Dr. Henrene Pastor Atrial fibrillation on Xarelto at home O2 dependent COPD Chronic kidney disease Anemia  FEN:  Regular diet GK:KDPTELMR x 3 days completed 11/26; Unasyn started 02/27/16, day 3 DVT: Heparin per pharmacy   Plan:  Dr. Excell Seltzer with discuss options for further treatment with cholecystectomy or more conservative observation.  I made her NPO after MN so we could proceed with surgery if that is their final recommendation.     LOS: 5 days    Thermon Zulauf 02/29/2016 [phone]

## 2016-03-01 ENCOUNTER — Encounter (HOSPITAL_COMMUNITY): Payer: Self-pay | Admitting: Internal Medicine

## 2016-03-01 DIAGNOSIS — K8033 Calculus of bile duct with acute cholangitis with obstruction: Secondary | ICD-10-CM

## 2016-03-01 LAB — HEPATIC FUNCTION PANEL
ALK PHOS: 126 U/L (ref 38–126)
ALT: 89 U/L — ABNORMAL HIGH (ref 14–54)
AST: 52 U/L — ABNORMAL HIGH (ref 15–41)
Albumin: 2.8 g/dL — ABNORMAL LOW (ref 3.5–5.0)
BILIRUBIN DIRECT: 0.1 mg/dL (ref 0.1–0.5)
BILIRUBIN INDIRECT: 0.6 mg/dL (ref 0.3–0.9)
BILIRUBIN TOTAL: 0.7 mg/dL (ref 0.3–1.2)
TOTAL PROTEIN: 5.5 g/dL — AB (ref 6.5–8.1)

## 2016-03-01 LAB — CBC
HCT: 28.3 % — ABNORMAL LOW (ref 36.0–46.0)
HEMOGLOBIN: 8.2 g/dL — AB (ref 12.0–15.0)
MCH: 26.4 pg (ref 26.0–34.0)
MCHC: 29 g/dL — AB (ref 30.0–36.0)
MCV: 91 fL (ref 78.0–100.0)
Platelets: 259 10*3/uL (ref 150–400)
RBC: 3.11 MIL/uL — ABNORMAL LOW (ref 3.87–5.11)
RDW: 14.9 % (ref 11.5–15.5)
WBC: 6.4 10*3/uL (ref 4.0–10.5)

## 2016-03-01 LAB — BASIC METABOLIC PANEL
ANION GAP: 8 (ref 5–15)
BUN: 12 mg/dL (ref 6–20)
CALCIUM: 7.7 mg/dL — AB (ref 8.9–10.3)
CO2: 32 mmol/L (ref 22–32)
CREATININE: 0.94 mg/dL (ref 0.44–1.00)
Chloride: 103 mmol/L (ref 101–111)
GFR calc Af Amer: >60 mL/min (ref >60)
GFR, EST NON AFRICAN AMERICAN: 56 mL/min — AB (ref >60)
GLUCOSE: 86 mg/dL (ref 65–99)
Potassium: 4.1 mmol/L (ref 3.5–5.1)
Sodium: 143 mmol/L (ref 135–145)

## 2016-03-01 LAB — MAGNESIUM: MAGNESIUM: 1.7 mg/dL (ref 1.7–2.4)

## 2016-03-01 NOTE — Evaluation (Signed)
Physical Therapy Evaluation Patient Details Name: Paula Contreras MRN: [id number] DOB: [birth year] Today's Date: 03/01/2016   History of Present Illness  Pt is an 82 year old female admitted for Choledocholithiasis with cholangitis S/p ERCP, common duct stone extraction, and sphincterotomy, 02/19/16.  PMHx COPD, dementia, HTN, CKD, afib  Clinical Impression  Pt admitted with above diagnosis. Pt currently with functional limitations due to the deficits listed below (see PT Problem List).  Pt will benefit from skilled PT to increase their independence and safety with mobility to allow discharge to the venue listed below.   Pt able to ambulate around room with min/guard assistance however fatigues quickly and reports LE weakness.  Pt states she is typically only ambulating in her room and meals are brought to her.  Pt concerned about being alone in ALF and agreeable to SNF if ALF cannot provide current level of care.     Follow Up Recommendations SNF;Supervision for mobility/OOB    Equipment Recommendations  None recommended by PT    Recommendations for Other Services       Precautions / Restrictions Precautions Precautions: Fall Precaution Comments: chronic O2 Restrictions Weight Bearing Restrictions: No      Mobility  Bed Mobility Overal bed mobility: Needs Assistance Bed Mobility: Supine to Sit;Sit to Supine     Supine to sit: Min guard;HOB elevated Sit to supine: Min guard   General bed mobility comments: increased time and effort, verbal cues for self assist  Transfers Overall transfer level: Needs assistance Equipment used: Rolling walker (2 wheeled) Transfers: Sit to/from Stand Sit to Stand: Min assist         General transfer comment: assist to rise and steady initially, pt able to perform again with min/guard  Ambulation/Gait Ambulation/Gait assistance: Min guard Ambulation Distance (Feet): 24 Feet Assistive device: Rolling walker (2 wheeled) Gait  Pattern/deviations: Step-through pattern;Trunk flexed;Decreased stride length     General Gait Details: pt only wanted to ambulate in room, refused hallway, fatigues quickly and reports LE weakness, remained on 3L O2 [place]  Stairs            Wheelchair Mobility    Modified Rankin (Stroke Patients Only)       Balance Overall balance assessment: Needs assistance         Standing balance support: Bilateral upper extremity supported Standing balance-Leahy Scale: Poor Standing balance comment: requires UE support                             Pertinent Vitals/Pain Pain Assessment: No/denies pain    Home Living Family/patient expects to be discharged to:: Assisted living               Home Equipment: Walker - 2 wheels      Prior Function Level of Independence: Needs assistance   Gait / Transfers Assistance Needed: pt states she is ambulatory with RW, typically only around room, meals brought to room           Hand Dominance        Extremity/Trunk Assessment               Lower Extremity Assessment: Generalized weakness      Cervical / Trunk Assessment: Kyphotic  Communication   Communication: HOH  Cognition Arousal/Alertness: Awake/alert Behavior During Therapy: Flat affect Overall Cognitive Status: Within Functional Limits for tasks assessed  General Comments      Exercises     Assessment/Plan    PT Assessment Patient needs continued PT services  PT Problem List Decreased strength;Decreased activity tolerance;Decreased mobility          PT Treatment Interventions DME instruction;Gait training;Functional mobility training;Therapeutic exercise;Therapeutic activities;Patient/family education    PT Goals (Current goals can be found in the Care Plan section)  Acute Rehab PT Goals PT Goal Formulation: With patient Time For Goal Achievement: 03/08/16 Potential to Achieve Goals: Good     Frequency Min 2X/week   Barriers to discharge        Co-evaluation               End of Session Equipment Utilized During Treatment: Gait belt;Oxygen Activity Tolerance: Patient limited by fatigue Patient left: in bed;with call bell/phone within reach;with bed alarm set           Time: 1046-1105 PT Time Calculation (min) (ACUTE ONLY): 19 min   Charges:   PT Evaluation $PT Eval Moderate Complexity: 1 Procedure     PT G Codes:        Shanoah Asbill,KATHrine E 03/01/2016, 11:45 AM Carmelia Bake, PT, DPT 03/01/2016 Pager: [phone]

## 2016-03-01 NOTE — Progress Notes (Signed)
PROGRESS NOTE    Paula Contreras  BCW:[id number] DOB: [birth year] DOA: 02/24/2016 PCP: Vena Austria, MD    Assessment & Plan:   Principal Problem:   Ascending cholangitis due to choledocolithiasis Active Problems:   Hypertension   COPD (chronic obstructive pulmonary disease) (HCC)   Aortic valve disorder   Chronic respiratory failure (HCC)   PAF (paroxysmal atrial fibrillation) (HCC)   CKD (chronic kidney disease), stage III   HCAP (healthcare-associated pneumonia)   LFTs abnormal   Epigastric pain   Nausea and vomiting   Bile duct stone   Chronic anticoagulation - Xerelto   Abnormal LFTs  #1 choledocholithiasis with ascending cholangitis Status post ERCP with sphincterotomy and common bile duct stone extraction per Dr. Henrene Pastor. Hold anticoagulation for at least one week per GI recommendations. Patient was initially made nothing by mouth overnight in anticipation of possible cholecystectomy. Patient has been assessed by general surgery and GI and at this point in time patient will not undergo cholecystectomy. Will place patient on full liquid diet and advance as tolerated.  Continue empiric IV antibiotics. Will likely transition to oral Augmentin tomorrow to complete a ten-day course of antibiotic treatment. Outpatient follow-up.  #2 hypokalemia Repleted.  #3 acute kidney injury Improved with hydration.  #4 paroxysmal atrial fibrillation  Currently rate controlled. Anticoagulation on hold for at least one week per GI recommendations as patient would recent sphincterotomy.  #5 COPD/chronic respiratory failure on home O2 Stable. Continue current nebulizer treatments, Spiriva, Flonase, Mucinex.  #6 history of aortic stenosis Stable. Outpatient follow-up.  #7 dysphagia  Speech Therapy has evaluated patient and recommend regular diet.   DVT prophylaxis: SCDs Code Status: DO NOT RESUSCITATE Family Communication: No family at bedside. Disposition Plan:  When  medically stable with clinical improvement tolerating oral intake and oral pills and per general surgery and GI.  Consultants:   Gen. surgery: Dr. Johney Maine 02/25/2016  Gastroenterology: Dr. Havery Moros 02/25/2016  Procedures:   CT abdomen and pelvis 02/25/2016  Chest x-ray 02/24/2016  MRCP 02/27/2016  Right upper quadrant ultrasound 02/24/2016  ERCP with sphincterotomy and common duct stone extraction per Dr. Henrene Pastor 02/29/2016  Antimicrobials:   IV Unasyn 02/27/2016  IV cefepime 02/24/2016>>>> 02/27/2016  IV vancomycin 02/24/2016>>>> 02/25/2016   Subjective: Patient status post ERCP. Patient alert, denies any chest pain, no shortness of breath, no abdominal pain. Patient asking whether she can eat.   Objective: Vitals:   02/29/16 1939 02/29/16 2108 03/01/16 0522 03/01/16 0956  BP:  (!) 152/57 (!) 133/55   Pulse:  65    Resp:  18 18   Temp:  99.4 F (37.4 C) 98.1 F (36.7 C)   TempSrc:  Oral Oral   SpO2: 98% 97% 98% 99%  Weight:      Height:        Intake/Output Summary (Last 24 hours) at 03/01/16 1036 Last data filed at 03/01/16 0650  Gross per 24 hour  Intake             1800 ml  Output                2 ml  Net             1798 ml   Filed Weights   02/24/16 1247  Weight: 73.9 kg (163 lb)    Examination:  General exam: NAD Respiratory system: Clear to auscultation anterior lung fields . Respiratory effort normal. Cardiovascular system: S1 & S2 heard, RRR. No JVD, murmurs, rubs, gallops or clicks. No pedal  edema. Gastrointestinal system: Abdomen is nondistended, soft and nontender. No organomegaly or masses felt. Normal bowel sounds heard. Central nervous system: Alert and oriented. No focal neurological deficits. Extremities: Symmetric 5 x 5 power. Skin: No rashes, lesions or ulcers Psychiatry:  unable to assess as patient drowsy.     Data Reviewed: I have personally reviewed following labs and imaging studies  CBC:  Recent Labs Lab  02/24/16 1224 02/25/16 0518 02/26/16 0450 02/27/16 0159 02/28/16 0448 02/29/16 0458 03/01/16 0456  WBC 15.0* 11.3* 10.4 8.7 6.1 5.9 6.4  NEUTROABS 13.6* 10.6*  --   --   --  3.4  --   HGB 9.1* 8.5* 7.9* 8.1* 8.7* 8.6* 8.2*  HCT 31.2* 29.4* 26.6* 27.2* 30.0* 29.5* 28.3*  MCV 91.0 90.5 89.0 86.9 91.7 90.5 91.0  PLT 322 304 308 308 318 308 010   Basic Metabolic Panel:  Recent Labs Lab 02/26/16 0450 02/27/16 0159 02/28/16 0448 02/29/16 0458 03/01/16 0456  NA 140 139 145 141 143  K 4.3 3.4* 4.6 4.0 4.1  CL 97* 97* 105 101 103  CO2 38* 36* 32 33* 32  GLUCOSE 128* 89 65 90 86  BUN 29* 25* '19 16 12  '$ CREATININE 1.20* 1.03* 0.90 0.94 0.94  CALCIUM 8.6* 8.3* 8.3* 8.0* 7.7*  MG  --   --   --  1.8 1.7   GFR: Estimated Creatinine Clearance: 46.4 mL/min (by C-G formula based on SCr of 0.94 mg/dL). Liver Function Tests:  Recent Labs Lab 02/26/16 0450 02/27/16 0159 02/28/16 0448 02/29/16 0458 03/01/16 0456  AST 190* 89* 42* 49* 52*  ALT 268* 198* 131* 104* 89*  ALKPHOS 148* 132* 132* 144* 126  BILITOT 0.9 0.9 0.8 0.4 0.7  PROT 6.6 6.4* 6.1* 5.8* 5.5*  ALBUMIN 2.9* 3.1* 3.0* 2.8* 2.8*    Recent Labs Lab 02/24/16 1901  LIPASE 20   No results for input(s): AMMONIA in the last 168 hours. Coagulation Profile:  Recent Labs Lab 02/24/16 1901  INR 1.64   Cardiac Enzymes: No results for input(s): CKTOTAL, CKMB, CKMBINDEX, TROPONINI in the last 168 hours. BNP (last 3 results) No results for input(s): PROBNP in the last 8760 hours. HbA1C: No results for input(s): HGBA1C in the last 72 hours. CBG: No results for input(s): GLUCAP in the last 168 hours. Lipid Profile: No results for input(s): CHOL, HDL, LDLCALC, TRIG, CHOLHDL, LDLDIRECT in the last 72 hours. Thyroid Function Tests: No results for input(s): TSH, T4TOTAL, FREET4, T3FREE, THYROIDAB in the last 72 hours. Anemia Panel: No results for input(s): VITAMINB12, FOLATE, FERRITIN, TIBC, IRON, RETICCTPCT in the last  72 hours. Sepsis Labs:  Recent Labs Lab 02/24/16 1254 02/24/16 1525  LATICACIDVEN 1.12 0.72    Recent Results (from the past 240 hour(s))  Culture, blood (routine x 2) Call MD if unable to obtain prior to antibiotics being given     Status: None   Collection Time: 02/24/16  8:56 PM  Result Value Ref Range Status   Specimen Description BLOOD LEFT ARM  Final   Special Requests IN PEDIATRIC BOTTLE 2 CC  Final   Culture   Final    NO GROWTH 5 DAYS Performed at [facility]    Report Status 02/29/2016 FINAL  Final  Culture, blood (routine x 2) Call MD if unable to obtain prior to antibiotics being given     Status: None   Collection Time: 02/24/16  8:56 PM  Result Value Ref Range Status   Specimen Description BLOOD LEFT  ARM  Final   Special Requests IN PEDIATRIC BOTTLE 2 CC  Final   Culture   Final    NO GROWTH 5 DAYS Performed at [facility]    Report Status 02/29/2016 FINAL  Final  MRSA PCR Screening     Status: None   Collection Time: 02/24/16  9:25 PM  Result Value Ref Range Status   MRSA by PCR NEGATIVE NEGATIVE Final    Comment:        The GeneXpert MRSA Assay (FDA approved for NASAL specimens only), is one component of a comprehensive MRSA colonization surveillance program. It is not intended to diagnose MRSA infection nor to guide or monitor treatment for MRSA infections.          Radiology Studies: Dg Ercp Biliary & Pancreatic Ducts  Result Date: 02/29/2016 CLINICAL DATA:  ERCP with sphincterotomy for stone removal. EXAM: ERCP TECHNIQUE: Multiple spot images obtained with the fluoroscopic device and submitted for interpretation post-procedure. FLUOROSCOPY TIME:  Fluoroscopy Time:  3 minutes and 44 seconds Radiation Exposure Index (if provided by the fluoroscopic device): 52.53 mGy COMPARISON:  MRI 02/27/2016 FINDINGS: First image demonstrates opacification of the main pancreatic duct. Cannulation and opacification of the biliary system. Mild  dilatation of the biliary system. No large filling defects or stones are identified. IMPRESSION: ERCP images for stone removal. Mild dilatation of the biliary system. These images were submitted for radiologic interpretation only. Please see the procedural report for the amount of contrast and the fluoroscopy time utilized. Electronically Signed   By: Markus Daft M.D.   On: 02/29/2016 13:48        Scheduled Meds: . ampicillin-sulbactam (UNASYN) IV  3 g Intravenous Q6H  . clobetasol cream   Topical 2 times per day on Mon Tue Wed Thu  . fluticasone  1 spray Each Nare Daily  . guaiFENesin  600 mg Oral BID  . hydrocortisone cream   Topical 2 times per day on Mon Tue Wed Thu  . lactose free nutrition  237 mL Oral BID BM  . levalbuterol  0.63 mg Nebulization TID  . pantoprazole  40 mg Oral Daily  . tiotropium  18 mcg Inhalation Daily   Continuous Infusions: . sodium chloride 50 mL/hr at 03/01/16 0024     LOS: 6 days    Time spent: 34 minutes   Renee Beale, MD [facility] Pager [phone]  If 7PM-7AM, please contact night-coverage [email or web address] Password [email or web address] 03/01/2016, 10:36 AM

## 2016-03-01 NOTE — Progress Notes (Signed)
CSW assisting with d/c planning. PT has recommended SNF at d/c. Pt / family Paula Contreras [phone] ) are in agreement with this plan. [facility] ( [facility] ) will have SNF bed available tomorrow for pt if stable for d/c.  Werner Lean LCSW [phone]

## 2016-03-01 NOTE — Progress Notes (Signed)
     [facility] Progress Note  Chief Complaint:   Abdominal pain, elevated LFT    Subjective: awoke her from sleep. No abdominal pain  Objective:  Vital signs in last 24 hours: Temp:  [97.8 F (36.6 C)-99.4 F (37.4 C)] 98.1 F (36.7 C) (11/30 0522) Pulse Rate:  [57-65] 65 (11/29 2108) Resp:  [11-18] 18 (11/30 0522) BP: (133-171)/(55-72) 133/55 (11/30 0522) SpO2:  [93 %-100 %] 98 % (11/30 0522) Last BM Date: 02/25/16 General:   Awoken from sleep, in NAD EENT:  Normal hearing, non icteric sclera, conjunctive pink.  Pulm: Normal respiratory effort Abdomen:  Soft, nondistended, nontender.  Normal bowel sounds,  Neurologic:  Alert and  oriented x4;  grossly normal neurologically. Psych: cooperative. Normal mood and affect.   Intake/Output from previous day: 11/29 0701 - 11/30 0700 In: 2003.3 [I.V.:2003.3] Out: 3 [Urine:3] Intake/Output this shift: No intake/output data recorded.  Lab Results:  Recent Labs  02/28/16 0448 02/29/16 0458 03/01/16 0456  WBC 6.1 5.9 6.4  HGB 8.7* 8.6* 8.2*  HCT 30.0* 29.5* 28.3*  PLT 318 308 259   BMET  Recent Labs  02/28/16 0448 02/29/16 0458 03/01/16 0456  NA 145 141 143  K 4.6 4.0 4.1  CL 105 101 103  CO2 32 33* 32  GLUCOSE 65 90 86  BUN '19 16 12  '$ CREATININE 0.90 0.94 0.94  CALCIUM 8.3* 8.0* 7.7*   LFT  Recent Labs  03/01/16 0456  PROT 5.5*  ALBUMIN 2.8*  AST 52*  ALT 89*  ALKPHOS 126  BILITOT 0.7  BILIDIR 0.1  IBILI 0.6    Dg Ercp Biliary & Pancreatic Ducts  Result Date: 02/29/2016 CLINICAL DATA:  ERCP with sphincterotomy for stone removal. EXAM: ERCP TECHNIQUE: Multiple spot images obtained with the fluoroscopic device and submitted for interpretation post-procedure. FLUOROSCOPY TIME:  Fluoroscopy Time:  3 minutes and 44 seconds Radiation Exposure Index (if provided by the fluoroscopic device): 52.53 mGy COMPARISON:  MRI 02/27/2016 FINDINGS: First image demonstrates opacification of the main  pancreatic duct. Cannulation and opacification of the biliary system. Mild dilatation of the biliary system. No large filling defects or stones are identified. IMPRESSION: ERCP images for stone removal. Mild dilatation of the biliary system. These images were submitted for radiologic interpretation only. Please see the procedural report for the amount of contrast and the fluoroscopy time utilized. Electronically Signed   By: Markus Daft M.D.   On: 02/29/2016 13:48    Assessment / Plan:  Choledocholithiasis with possible cholangitis. She is s/p ERCP with sphincterotomy and CBD stone extraction yesterday. LFTs nearly normal now. Normal white count. On Unasyn. Surgery considering cholecystectomy.  GI will sign off, call for questions     LOS: 6 days   Tye Savoy  03/01/2016, 9:16 AM Pager number [phone]  GI ATTENDING  Interval history data reviewed. Patient seen and examined. Agree with interval progress note. Will sign off. GI follow-up not necessary. Discussed with Dr. Grandville Silos yesterday.  Docia Chuck. Geri Seminole., M.D. [facility] Division of Gastroenterology

## 2016-03-01 NOTE — Progress Notes (Signed)
1 Day Post-Op  Subjective: Does not like the the potatoes soup.  No pain.  Comfortable in bed on O2.  Objective: Vital signs in last 24 hours: Temp:  [97.8 F (36.6 C)-99.4 F (37.4 C)] 98.1 F (36.7 C) (11/30 0522) Pulse Rate:  [57-65] 65 (11/29 2108) Resp:  [11-18] 18 (11/30 0522) BP: (133-171)/(55-72) 133/55 (11/30 0522) SpO2:  [93 %-100 %] 99 % (11/30 0956) Last BM Date: 02/25/16 Afebrile, TM 99.4 Labs continue to improve, WBC remains normal  Intake/Output from previous day: 11/29 0701 - 11/30 0700 In: 2003.3 [I.V.:2003.3] Out: 3 [Urine:3] Intake/Output this shift: No intake/output data recorded.  General appearance: alert, cooperative and no distress GI: soft, non-tender; bowel sounds normal; no masses,  no organomegaly  Lab Results:   Recent Labs  02/29/16 0458 03/01/16 0456  WBC 5.9 6.4  HGB 8.6* 8.2*  HCT 29.5* 28.3*  PLT 308 259    BMET  Recent Labs  02/29/16 0458 03/01/16 0456  NA 141 143  K 4.0 4.1  CL 101 103  CO2 33* 32  GLUCOSE 90 86  BUN 16 12  CREATININE 0.94 0.94  CALCIUM 8.0* 7.7*   PT/INR No results for input(s): LABPROT, INR in the last 72 hours.   Recent Labs Lab 02/26/16 0450 02/27/16 0159 02/28/16 0448 02/29/16 0458 03/01/16 0456  AST 190* 89* 42* 49* 52*  ALT 268* 198* 131* 104* 89*  ALKPHOS 148* 132* 132* 144* 126  BILITOT 0.9 0.9 0.8 0.4 0.7  PROT 6.6 6.4* 6.1* 5.8* 5.5*  ALBUMIN 2.9* 3.1* 3.0* 2.8* 2.8*     Lipase     Component Value Date/Time   LIPASE 20 02/24/2016 1901     Studies/Results: Dg Ercp Biliary & Pancreatic Ducts  Result Date: 02/29/2016 CLINICAL DATA:  ERCP with sphincterotomy for stone removal. EXAM: ERCP TECHNIQUE: Multiple spot images obtained with the fluoroscopic device and submitted for interpretation post-procedure. FLUOROSCOPY TIME:  Fluoroscopy Time:  3 minutes and 44 seconds Radiation Exposure Index (if provided by the fluoroscopic device): 52.53 mGy COMPARISON:  MRI 02/27/2016  FINDINGS: First image demonstrates opacification of the main pancreatic duct. Cannulation and opacification of the biliary system. Mild dilatation of the biliary system. No large filling defects or stones are identified. IMPRESSION: ERCP images for stone removal. Mild dilatation of the biliary system. These images were submitted for radiologic interpretation only. Please see the procedural report for the amount of contrast and the fluoroscopy time utilized. Electronically Signed   By: Markus Daft M.D.   On: 02/29/2016 13:48    Medications: . ampicillin-sulbactam (UNASYN) IV  3 g Intravenous Q6H  . clobetasol cream   Topical 2 times per day on Mon Tue Wed Thu  . fluticasone  1 spray Each Nare Daily  . guaiFENesin  600 mg Oral BID  . hydrocortisone cream   Topical 2 times per day on Mon Tue Wed Thu  . lactose free nutrition  237 mL Oral BID BM  . levalbuterol  0.63 mg Nebulization TID  . pantoprazole  40 mg Oral Daily  . tiotropium  18 mcg Inhalation Daily   . sodium chloride 50 mL/hr at 03/01/16 0024    Assessment/Plan Choledocholithiasis with cholangitis. S/p ERCP, common duct stone extraction, and sphincterotomy, 02/19/16, Dr. Henrene Pastor Atrial fibrillation on Xarelto at home O2 dependent COPD Chronic kidney disease Anemia  FEN: full liquids VZ:[id number] x 3 days completed 11/26; Unasyn started 02/27/16, day 3 DVT: Heparin per pharmacy    Plan:  Dr. Excell Seltzer is  recommending we follow her conservatively for now and avoid surgery if possible.  He discussed this with her last PM, but currently she has no recollection of this.  LOS: 6 days    Paula Contreras 03/01/2016 [phone]

## 2016-03-01 NOTE — Progress Notes (Signed)
Pharmacy Antibiotic Note  Paula Contreras is a 82 y.o. female admitted on 02/24/2016 with suspected pneumonia.  She c/o generalized weakness, productive cough, fever.  Pharmacy was initially consulted for Vancomycin dosing and Cefepime renal antibiotic adjustment.  She was narrowed to Cefepime alone, but with concern for cholangitis, Pharmacy is now consulted to dose Unasyn for better GI/anaerobe coverage.  Day #7 abx, Day #4 Unasyn   CrCl ~ 46 ml/min WBC remains WNL Afebrile  Plan:  Unasyn 3g IV q6 hrs  Follow up renal fxn, culture results, and clinical course.  Height: '5\' 7"'$  (170.2 cm) Weight: 163 lb (73.9 kg) IBW/kg (Calculated) : 61.6  Temp (24hrs), Avg:98.3 F (36.8 C), Min:97.8 F (36.6 C), Max:99.4 F (37.4 C)   Recent Labs Lab 02/24/16 1254 02/24/16 1525  02/26/16 0450 02/27/16 0159 02/28/16 0448 02/29/16 0458 03/01/16 0456  WBC  --   --   < > 10.4 8.7 6.1 5.9 6.4  CREATININE  --   --   < > 1.20* 1.03* 0.90 0.94 0.94  LATICACIDVEN 1.12 0.72  --   --   --   --   --   --   < > = values in this interval not displayed.  Estimated Creatinine Clearance: 46.4 mL/min (by C-G formula based on SCr of 0.94 mg/dL).    Allergies  Allergen Reactions  . Codeine Nausea And Vomiting    Antimicrobials this admission: 11/24 Vancomycin >> 11/25 11/24 Cefepime >> 11/27 11/27 Unasyn >>   Dose adjustments this admission:   Microbiology results: 11/24 BCx: NGF 11/24 MRSA PCR: negative  Thank you for allowing pharmacy to be a part of this patient's care.  Gretta Arab PharmD, BCPS Pager [phone] 03/01/2016 8:51 AM

## 2016-03-01 NOTE — NC FL2 (Signed)
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 Abnormal LFTs   . Bile duct stone 02/25/2016  . Chronic anticoagulation - Xerelto 02/25/2016  . Ascending cholangitis due to choledocolithiasis 02/25/2016  . HCAP (healthcare-associated pneumonia) 02/24/2016  . LFTs abnormal 02/24/2016  . Epigastric pain 02/24/2016  . Nausea and vomiting 02/24/2016  . History of bradycardia 10/27/2015  . SOB (shortness of breath)   . Encounter for palliative care   . Hypernatremia 02/21/2015  . Altered mental status 02/20/2015  . Metabolic encephalopathy 75/64/3329  . Weakness generalized 02/20/2015  . Dehydration 02/20/2015  . Leukocytosis 02/20/2015  . Anemia 02/20/2015  . Bronchitis 02/20/2015  . Fall at nursing home 02/20/2015  . Bradycardia 04/09/2014  . Chronic respiratory failure ([place])   . Aortic stenosis   . PAF (paroxysmal atrial fibrillation) ([place])   . CKD (chronic kidney disease), stage III   . Aortic valve disorder 10/30/2013  . Undiagnosed cardiac murmurs 07/21/2013  . Hypertension 10/02/2010  . COPD (chronic obstructive pulmonary disease) ([place]) 10/02/2010    Orientation RESPIRATION BLADDER Height & Weight     Self, Time, Situation, Place  O2 Incontinent Weight: 163 lb (73.9 kg) Height:  '5\' 7"'$  (170.2 cm)   BEHAVIORAL SYMPTOMS/MOOD NEUROLOGICAL BOWEL NUTRITION STATUS  Other (Comment) (no behaviors)  (NONE ) Continent Diet (clears-expect to advance )  AMBULATORY STATUS COMMUNICATION OF NEEDS Skin   Limited Assist Verbally Normal                       Personal Care Assistance Level of Assistance  Bathing, Feeding, Dressing Bathing Assistance: Limited assistance Feeding assistance: Independent Dressing Assistance: Limited assistance     Functional Limitations Info  Sight, Hearing, Speech Sight Info: Adequate Hearing Info: Impaired Speech Info: Adequate    SPECIAL CARE FACTORS FREQUENCY  PT (By licensed PT), OT (By licensed OT)     PT Frequency: 5x wk OT Frequency: 5x wk            Contractures Contractures Info: Not present    Additional Factors Info  Code Status, Allergies Code Status Info: DNR Allergies Info: codeine           Current Medications (03/01/2016):  This is the current hospital active medication list Current Facility-Administered Medications  Medication Dose Route Frequency Provider Last Rate Last Dose  . 0.9 %  sodium chloride infusion   Intravenous Continuous Vena Rua, PA-C 50 mL/hr at 03/01/16 0024    . Ampicillin-Sulbactam (UNASYN) 3 g in sodium chloride 0.9 % 100 mL IVPB  3 g Intravenous Q6H Christine E Shade, RPH   3 g at 03/01/16 1117  . clobetasol cream (TEMOVATE) 0.05 %   Topical 2 times per day on Mon Tue Wed Thu Hosie Poisson, MD      .  fluticasone (FLONASE) 50 MCG/ACT nasal spray 1 spray  1 spray Each Nare Daily Florencia Reasons, MD   1 spray at 03/01/16 1113  . guaiFENesin (MUCINEX) 12 hr tablet 600 mg  600 mg Oral BID Florencia Reasons, MD   600 mg at 02/29/16 2245  . hydrocortisone cream 1 %   Topical 2 times per day on Mon Tue Wed Thu Vijaya Akula, MD      . ipratropium (ATROVENT) nebulizer solution 0.5 mg  0.5 mg Nebulization Q6H PRN Lily Kocher, MD   0.5 mg at 02/25/16 0105  . lactose free nutrition (BOOST PLUS) liquid 237 mL  237 mL Oral BID BM  Florencia Reasons, MD      . levalbuterol [facility]) nebulizer solution 0.63 mg  0.63 mg Nebulization Q6H PRN Lily Kocher, MD   0.63 mg at 02/25/16 0104  . levalbuterol (XOPENEX) nebulizer solution 0.63 mg  0.63 mg Nebulization TID Lily Kocher, MD   0.63 mg at 03/01/16 0953  . ondansetron (ZOFRAN) injection 4 mg  4 mg Intravenous Q6H PRN Lily Kocher, MD      . pantoprazole (PROTONIX) EC tablet 40 mg  40 mg Oral Daily Lily Kocher, MD   40 mg at 03/01/16 1113  . tiotropium (SPIRIVA) inhalation capsule 18 mcg  18 mcg Inhalation Daily Lily Kocher, MD   18 mcg at 03/01/16 2633     Discharge Medications: Please see discharge summary for a list of discharge medications.  Relevant Imaging Results:  Relevant Lab Results:   Additional Information ss # [id number]  Adelina Collard, Randall An, LCSW

## 2016-03-01 NOTE — Evaluation (Signed)
Occupational Therapy Evaluation Patient Details Name: Paula Contreras MRN: [id number] DOB: February 21, 1934 Today's Date: 03/01/2016    History of Present Illness Pt is an 82 year old female admitted for Choledocholithiasis with cholangitis S/p ERCP, common duct stone extraction, and sphincterotomy, 02/19/16.  PMHx COPD, dementia, HTN, CKD, afib   Clinical Impression   Pt admitted with the above diagnosis and has the deficits outlined below. Pt would benefit from cont OT to increase safety and independence with basic adl tranfers and adls so she can return to safely live at [facility] facility. If HHOT is available, pt would benefit from [facility] at the ALF and if assist is not available for this pt at ALF, she may need SNF level of care.      Follow Up Recommendations  Home health OT;Supervision/Assistance - 24 hour    Equipment Recommendations  None recommended by OT    Recommendations for Other Services       Precautions / Restrictions Precautions Precautions: Fall Precaution Comments: chronic O2 Restrictions Weight Bearing Restrictions: No      Mobility Bed Mobility Overal bed mobility: Needs Assistance Bed Mobility: Supine to Sit;Sit to Supine     Supine to sit: Min guard;HOB elevated Sit to supine: Min guard   General bed mobility comments: Pt up on arrival.  Transfers Overall transfer level: Needs assistance Equipment used: Rolling walker (2 wheeled) Transfers: Sit to/from Stand Sit to Stand: Min assist         General transfer comment: assist to rise and steady initially, pt able to perform again with min/guard    Balance Overall balance assessment: Needs assistance Sitting-balance support: Feet supported Sitting balance-Leahy Scale: Fair     Standing balance support: Bilateral upper extremity supported;During functional activity Standing balance-Leahy Scale: Poor Standing balance comment: requires UE support                            ADL  Overall ADL's : Needs assistance/impaired Eating/Feeding: Set up;Sitting   Grooming: Oral care;Wash/dry face;Wash/dry hands;Set up;Sitting   Upper Body Bathing: Set up;Sitting   Lower Body Bathing: Moderate assistance;Sit to/from stand Lower Body Bathing Details (indicate cue type and reason): pt requires steading assist when standing with walker while someone else bathes her back side. Upper Body Dressing : Minimal assistance;Sitting   Lower Body Dressing: Moderate assistance;Sit to/from stand Lower Body Dressing Details (indicate cue type and reason): assist to start pants over feet and to donn socks. Toilet Transfer: Minimal assistance;Cueing for safety;Ambulation;Comfort height toilet;Grab bars;RW Toilet Transfer Details (indicate cue type and reason): pt walked to bathroom with walker.   Toileting- Clothing Manipulation and Hygiene: Moderate assistance;Sit to/from stand Toileting - Clothing Manipulation Details (indicate cue type and reason): Pt can clean self while sitting but does not do a thorough job so assist is needed.     Functional mobility during ADLs: Minimal assistance;Rolling walker General ADL Comments: Pt appears to be close to baseline status with adls but does fatigue more quickly and requires more rest breaks than usual.  Pt is unsteady on her feet when she becomes tired.     Vision Vision Assessment?: No apparent visual deficits   Perception     Praxis      Pertinent Vitals/Pain Pain Assessment: No/denies pain     Hand Dominance Right   Extremity/Trunk Assessment Upper Extremity Assessment Upper Extremity Assessment: Overall WFL for tasks assessed   Lower Extremity Assessment Lower Extremity Assessment: Defer to  PT evaluation   Cervical / Trunk Assessment Cervical / Trunk Assessment: Kyphotic   Communication Communication Communication: HOH   Cognition Arousal/Alertness: Awake/alert Behavior During Therapy: WFL for tasks  assessed/performed Overall Cognitive Status: History of cognitive impairments - at baseline                     General Comments       Exercises       Shoulder Instructions      Home Living Family/patient expects to be discharged to:: Assisted living                             Home Equipment: Walker - 2 wheels;Shower seat   Additional Comments: Pt lives at [facility].        Prior Functioning/Environment Level of Independence: Needs assistance  Gait / Transfers Assistance Needed: pt states she is ambulatory with RW, typically only around room, meals brought to room ADL's / Homemaking Assistance Needed: Pt has assist with all meals(delivered to room) and housekeeping.  Pt also gets assist with bathing/dressing.  Pt can toilet with mod I and grooms/eats without assist.            OT Problem List: Impaired balance (sitting and/or standing);Decreased activity tolerance;Decreased safety awareness;Decreased knowledge of use of DME or AE   OT Treatment/Interventions: Self-care/ADL training;DME and/or AE instruction;Therapeutic activities    OT Goals(Current goals can be found in the care plan section) Acute Rehab OT Goals Patient Stated Goal: to get back home OT Goal Formulation: With patient Time For Goal Achievement: 03/15/16 Potential to Achieve Goals: Good ADL Goals Pt Will Transfer to Toilet: with supervision;ambulating;grab bars Additional ADL Goal #1: Pt will state two things she should do at home to try to save her energy in assisted living with one cue.  OT Frequency: Min 2X/week   Barriers to D/C:    Pt lives at ALF       Co-evaluation              End of Session Equipment Utilized During Treatment: Rolling walker;Oxygen Nurse Communication: Mobility status  Activity Tolerance: Patient limited by fatigue Patient left: in chair;with call bell/phone within reach;with chair alarm set   Time: [phone] OT Time Calculation (min): 18  min Charges:  OT General Charges $OT Visit: 1 Procedure OT Evaluation $OT Eval Moderate Complexity: 1 Procedure G-Codes:    Glenford Peers 2016/03/23, 12:49 PM  [phone]

## 2016-03-02 DIAGNOSIS — J189 Pneumonia, unspecified organism: Secondary | ICD-10-CM

## 2016-03-02 HISTORY — DX: Pneumonia, unspecified organism: J18.9

## 2016-03-02 LAB — HEPATIC FUNCTION PANEL
ALBUMIN: 2.9 g/dL — AB (ref 3.5–5.0)
ALT: 102 U/L — ABNORMAL HIGH (ref 14–54)
AST: 112 U/L — AB (ref 15–41)
Alkaline Phosphatase: 233 U/L — ABNORMAL HIGH (ref 38–126)
BILIRUBIN TOTAL: 0.8 mg/dL (ref 0.3–1.2)
Bilirubin, Direct: 0.4 mg/dL (ref 0.1–0.5)
Indirect Bilirubin: 0.4 mg/dL (ref 0.3–0.9)
Total Protein: 5.9 g/dL — ABNORMAL LOW (ref 6.5–8.1)

## 2016-03-02 LAB — BASIC METABOLIC PANEL
Anion gap: 7 (ref 5–15)
BUN: 12 mg/dL (ref 6–20)
CALCIUM: 8.2 mg/dL — AB (ref 8.9–10.3)
CHLORIDE: 101 mmol/L (ref 101–111)
CO2: 34 mmol/L — ABNORMAL HIGH (ref 22–32)
CREATININE: 0.96 mg/dL (ref 0.44–1.00)
GFR calc Af Amer: >60 mL/min (ref >60)
GFR, EST NON AFRICAN AMERICAN: 54 mL/min — AB (ref >60)
GLUCOSE: 120 mg/dL — AB (ref 65–99)
POTASSIUM: 4 mmol/L (ref 3.5–5.1)
Sodium: 142 mmol/L (ref 135–145)

## 2016-03-02 LAB — CBC
HCT: 28.6 % — ABNORMAL LOW (ref 36.0–46.0)
HEMOGLOBIN: 8.2 g/dL — AB (ref 12.0–15.0)
MCH: 26.2 pg (ref 26.0–34.0)
MCHC: 28.7 g/dL — ABNORMAL LOW (ref 30.0–36.0)
MCV: 91.4 fL (ref 78.0–100.0)
PLATELETS: 293 10*3/uL (ref 150–400)
RBC: 3.13 MIL/uL — AB (ref 3.87–5.11)
RDW: 15.1 % (ref 11.5–15.5)
WBC: 11.8 10*3/uL — ABNORMAL HIGH (ref 4.0–10.5)

## 2016-03-02 MED ORDER — AMOXICILLIN-POT CLAVULANATE 875-125 MG PO TABS: 1.0000 | ORAL_TABLET | Freq: Two times a day (BID) | ORAL | 0 refills | Status: DC

## 2016-03-02 MED ORDER — LEVALBUTEROL HCL 0.63 MG/3ML IN NEBU: 0.6300 mg | INHALATION_SOLUTION | Freq: Four times a day (QID) | RESPIRATORY_TRACT | Status: AC | PRN

## 2016-03-02 MED ORDER — RIVAROXABAN 20 MG PO TABS: 20.0000 mg | ORAL_TABLET | Freq: Every day | ORAL | 0 refills | Status: DC

## 2016-03-02 MED ORDER — AMOXICILLIN-POT CLAVULANATE 875-125 MG PO TABS
1.0000 | ORAL_TABLET | Freq: Two times a day (BID) | ORAL | Status: DC
Start: 2016-03-02 — End: 2016-03-02
  Administered 2016-03-02: 1 via ORAL
  Filled 2016-03-02: qty 1

## 2016-03-02 MED ORDER — BOOST PLUS PO LIQD: 237.0000 mL | Freq: Two times a day (BID) | ORAL | 0 refills | Status: DC

## 2016-03-02 MED ORDER — FLUTICASONE PROPIONATE 50 MCG/ACT NA SUSP: 1.0000 | Freq: Every day | NASAL | 2 refills | Status: DC

## 2016-03-02 NOTE — Discharge Summary (Signed)
Physician Discharge Summary  Paula Contreras PNT:[id number] DOB: [birth year] DOA: 02/24/2016  PCP: Paula Austria, MD  Admit date: 02/24/2016 Discharge date: 03/02/2016  Time spent: 65 minutes  Recommendations for Outpatient Follow-up:  1. Patient will be discharged to skilled nursing facility at [place]. Patient is to follow-up with M.D. at skilled nursing facility and will need a comprehensive metabolic profile done in 2-3 weeks. 2. Patient to resume xarelto in approximately one week.   Discharge Diagnoses:  Principal Problem:   Ascending cholangitis due to choledocolithiasis Active Problems:   Hypertension   COPD (chronic obstructive pulmonary disease) (HCC)   Aortic valve disorder   Chronic respiratory failure (HCC)   PAF (paroxysmal atrial fibrillation) (HCC)   CKD (chronic kidney disease), stage III   HCAP (healthcare-associated pneumonia)   LFTs abnormal   Epigastric pain   Nausea and vomiting   Bile duct stone   Chronic anticoagulation - Xerelto   Abnormal LFTs   Discharge Condition: Stable and improved  Diet recommendation: Heart healthy  Filed Weights   02/24/16 1247  Weight: 73.9 kg (163 lb)    History of present illness:  Per Dr. Lynda Contreras is a 82 y.o. woman with a history of paroxysmal atrial fibrillation (CHADS-Vasc score of 4, anticoagulated with Xarelto), aortic stenosis (valve area around 1.5cm2 on last echo), COPD with chronic respiratory failure on 3L [place] at home at baseline, CKD 3, and dementia who presented to the ED for evaluation of generalized weakness, persistent cough, and fever.  She has had sweats and chills but no documented fever until night of admission.  Cough was dry (she was seen in the ED one week ago for cough with hemoptysis; she reported that hemoptysis had resolved).  She also developed epigastric pain last night after dinner.  She denied eating excessively or having any foods that are not typical for her.  She had at  least two episodes of nausea and nonbloody emesis.  She has one episode of light-headedness and near syncope but she denies LOC or actual falls.  No chest pain or shortness of breath.  ED Course: Rectal temp 101.6.  WBC count elevated to 15.  Hgb low but stable around 9.  Normal lactic acid levels x 2.  No frank infiltrate on chest xray but she has a small left pleural effusion.  ED attending concerned about HCAP and has initiated antibiotic coverage with vanc and cefepime.  LFTs also markedly abnormal; evaluation initiated by Dr Eulas Post, at time of admission.  Patient denied excessive acetaminophen use.  No new medications.  Based on documented surgical history, gallbladder should still be intact.  Hospital Course:  #1 choledocholithiasis with ascending cholangitis Patient was admitted with fever, leukocytosis, transaminitis with initial workup worrisome for choledocholithiasis with ascending cholangitis, acute cholecystitis. Right upper quadrant ultrasound was obtained but was unrevealing. CT abdomen and pelvis was subsequently obtained which showed mild dilatation of common bile duct with 2 mm choledocholithiasis in the lower duct within the head of the pancreas. Moderate hiatal hernia. Coronary artery calcification. Moderate L1 compression deformity near from prior radiographs age indeterminate. Incidental finding of 6.8 cm right adnexal cystic lesion. MRCP obtained on 02/27/2016 showed mild central intrahepatic biliary dilatation and mild dilatation of extrahepatic common bile duct measuring up to 9 mm. Tiny 3 mm hypointense filling defect within the distal common bile duct at the head of the pancreas could reflect a small stone. Small bilateral pleural effusions. GI and general surgery were consulted. It was felt per GI  that patient likely had choledocholithiasis with likely cholangitis. Patient was placed empirically on IV antibiotics and Xarelto was held  In anticipation of ERCP with sphincterotomy.  General surgery was consulted for consideration of cholecystectomy. Patient subsequently underwent ERCP with sphincterotomy and common bile duct stone extraction per Dr. Henrene Pastor on 02/29/2016. Patient improved clinically postoperatively and was maintained on IV Unasyn. Patient remained afebrile. With some improvement initially with LFTs.. Held anticoagulation for at least one week per GI recommendations. Patient was initially made nothing by mouth overnight in anticipation of possible cholecystectomy. Patient has been assessed by general surgery and GI and at this point in time patient will not undergo cholecystectomy. Patient's diet was subsequently advanced to a full liquid diet and then a low-fat diet which she tolerated. Patient had a slight bump in her LFTs prior to discharge however remained asymptomatic. Patient was seen by general surgery and GI and it was felt that a slight bump in LFTs following ERCP was not unusual. It was felt that patient was [facility] for discharge and patient will likely need repeat LFTs done in about 2-3 weeks. Patient antibiotics have been narrowed down and patient currently on Augmentin. Patient be discharged on 6 more days of Augmentin to complete a course of antibiotic treatment. Patient will likely need a comprehensive metabolic profile done in approximately 2-3 weeks. It was felt per general surgery done as patient had improved clinically with stone extraction due to the significant comorbidities cholecystectomy was not warranted at this time. Outpatient follow-up.   #2 hypokalemia Repleted.  #3 acute kidney injury Improved with hydration.  #4 paroxysmal atrial fibrillation CHADS2VASC score 4   patient remained rate controlled during the hospitalization. Patient's anticoagulation was held in anticipation of ERCP. Patient underwent ERCP with sphincterectomy. Anticoagulation to be held for at least one week per GI recommendations as patient would recent sphincterotomy.  Outpatient follow-up.  #5 COPD/chronic respiratory failure on home O2 Stable. Patient was maintained on nebulizer treatments, Spiriva, Flonase and Mucinex. Patient on chronic home O2.  #6 history of aortic stenosis Stable. Outpatient follow-up.  #7 dysphagia  Speech Therapy has evaluated patient and recommend regular diet. Patient postprocedure was subsequently placed on full liquid diet and then advance to heart healthy diet.   Procedures:  CT abdomen and pelvis 02/25/2016  Chest x-ray 02/24/2016  MRCP 02/27/2016  Right upper quadrant ultrasound 02/24/2016  ERCP with sphincterotomy and common duct stone extraction per Dr. Henrene Pastor 02/29/2016   Consultations:  Gen. surgery: Dr. Johney Maine 02/25/2016  Gastroenterology: Dr. Havery Moros 02/25/2016  Discharge Exam: Vitals:   03/01/16 2215 03/02/16 1423  BP: (!) 135/54 120/60  Pulse: 67 67  Resp: 18 18  Temp: 98.8 F (37.1 C) 98.4 F (36.9 C)    General: NAD Cardiovascular: RRR Respiratory: CTAB Abdomen: Soft, nontender, nondistended, positive bowel sounds.  Discharge Instructions   Discharge Instructions    Diet - low sodium heart healthy    Complete by:  As directed    Increase activity slowly    Complete by:  As directed      Current Discharge Medication List    START taking these medications   Details  amoxicillin-clavulanate (AUGMENTIN) 875-125 MG tablet Take 1 tablet by mouth every 12 (twelve) hours. Take for 6 days then stop. Qty: 12 tablet, Refills: 0    fluticasone (FLONASE) 50 MCG/ACT nasal spray Place 1 spray into both nostrils daily. Refills: 2    lactose free nutrition (BOOST PLUS) LIQD Take 237 mLs by mouth 2 (two) times  daily between meals. Refills: 0      CONTINUE these medications which have CHANGED   Details  levalbuterol (XOPENEX) 0.63 MG/3ML nebulizer solution Take 3 mLs (0.63 mg total) by nebulization every 6 (six) hours as needed for wheezing or shortness of breath.     rivaroxaban (XARELTO) 20 MG TABS tablet Take 1 tablet (20 mg total) by mouth daily. Resume in 1 week, 03/09/2016. Qty: 30 tablet, Refills: 0      CONTINUE these medications which have NOT CHANGED   Details  acetaminophen (TYLENOL) 325 MG tablet Take 650 mg by mouth every 6 (six) hours as needed (for pain).     albuterol (PROAIR HFA) 108 (90 BASE) MCG/ACT inhaler Inhale 2 puffs into the lungs every 4 (four) hours as needed for wheezing or shortness of breath. Qty: 1 Inhaler, Refills: 3    alendronate (FOSAMAX) 70 MG tablet Take 70 mg by mouth every Monday. Take with a full glass of water on an empty stomach.    calcium carbonate (OS-CAL) 600 MG TABS tablet Take 2 tablets by mouth daily with breakfast.     Cholecalciferol (VITAMIN D) 2000 UNITS CAPS Take 2,000 Units by mouth daily with breakfast.     clobetasol (TEMOVATE) 0.05 % external solution apply 4 drops to scalp one daily Monday- Thursday for psoriasis    esomeprazole (NEXIUM) 20 MG capsule Take 20 mg by mouth daily at 12 noon.    hydrocortisone 2.5 % cream Apply 1 application topically See admin instructions. Apply to Affected area topically to ears twice a day on Monday Tuesday Wednesday and Thursday    ipratropium (ATROVENT) 0.02 % nebulizer solution Take 2.5 mLs (0.5 mg total) by nebulization every 2 (two) hours as needed for wheezing or shortness of breath. Qty: 75 mL, Refills: 12    ketoconazole (NIZORAL) 2 % shampoo Apply 1 application topically as directed.    Multiple Vitamins-Iron (MULTIVITAMIN/IRON PO) Take 1 tablet by mouth daily.    OXYGEN Inhale 2-3 L into the lungs continuous. Change to 3L when on portable    senna-docusate (SENOKOT-S) 8.6-50 MG tablet Take 1 tablet by mouth at bedtime as needed for mild constipation.    Tiotropium Bromide Monohydrate (SPIRIVA RESPIMAT) 2.5 MCG/ACT AERS Inhale 2 puffs into the lungs daily.       Allergies  Allergen Reactions  . Codeine Nausea And Vomiting   Follow-up  Information    MD AT SNF Follow up.   Why:  F/U WITH MD AT SNF           The results of significant diagnostics from this hospitalization (including imaging, microbiology, ancillary and laboratory) are listed below for reference.    Significant Diagnostic Studies: Dg Chest 2 View  Result Date: 02/24/2016 CLINICAL DATA:  Shortness of Breath EXAM: CHEST  2 VIEW COMPARISON:  02/16/2016 FINDINGS: Cardiac shadow is stable. The lungs are well aerated bilaterally with diffuse interstitial changes stable from the prior study. No focal infiltrate is seen. Minimal left effusion is noted. Multiple compression deformities are noted within the thoracic spine. Aortic calcifications are noted. IMPRESSION: Small left-sided pleural effusion.  No focal infiltrate is seen. Electronically Signed   By: Inez Catalina M.D.   On: 02/24/2016 13:07   Ct Abdomen Pelvis W Contrast  Result Date: 02/25/2016 CLINICAL DATA:  82 y/o  F; general weakness and mild fever. EXAM: CT ABDOMEN AND PELVIS WITH CONTRAST TECHNIQUE: Multidetector CT imaging of the abdomen and pelvis was performed using the standard protocol following bolus  administration of intravenous contrast. CONTRAST:  163m ISOVUE-300 IOPAMIDOL (ISOVUE-300) INJECTION 61% COMPARISON:  02/24/2016 abdominal ultrasound. 02/24/2016 chest radiograph. 04/05/2014 lumbar spine radiograph. FINDINGS: Lower chest: Small left pleural effusion. Moderate hiatal hernia. Coronary artery calcifications. Hepatobiliary: No focal liver abnormality. Mild distention of the bladder and common bile duct measuring up to 12 mm. 2 mm gallstone within the lower common bile duct (series 5, image 47). Pancreas: Unremarkable. No pancreatic ductal dilatation or surrounding inflammatory changes. Spleen: Normal in size without focal abnormality. Adrenals/Urinary Tract: Adrenal glands are unremarkable. Kidneys are normal, without renal calculi, focal lesion, or hydronephrosis. Bladder is unremarkable.  Stomach/Bowel: Stomach is within normal limits. Appendix appears normal. No evidence of bowel wall thickening, distention, or inflammatory changes. Vascular/Lymphatic: Aortic atherosclerosis. No enlarged abdominal or pelvic lymph nodes. 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 Reproductive: Uterus is unremarkable. 6.8 cm right adnexal cystic lesion. No appreciable enhancing nodular components. Other: No abdominal wall hernia or abnormality. No abdominopelvic ascites. Musculoskeletal: T10 and L5 vertebral body compression deformities and L4 inferior endplate compression deformity stable in comparison with prior lumbar and chest radiographs. Moderate L1 compression deformity is new from prior radiographs. IMPRESSION: 1. Mild dilatation of common bile duct with 2 mm choledocholithiasis in the lower duct within head of pancreas. Given elevated liver function tests obstruction is suspected. No pancreatic ductal dilatation. 2. Moderate L1 compression deformity new from prior radiographs, age indeterminate. 3. Small left pleural effusion. 4. Moderate hiatal hernia. 5. Coronary artery calcification. 6. 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 7. **An incidental finding of potential clinical significance has been found. 6.8 cm right adnexal cystic lesion. Further evaluation with pelvic ultrasound is recommended on a nonemergent basis. This recommendation follows ACR consensus guidelines: White Paper of the ACR Incidental Findings Committee II on Adnexal Findings. J Am Coll Radiol [id number]** These results will be called to the ordering clinician or representative by the Radiologist Assistant, and communication documented in the PACS or zVision Dashboard. Electronically Signed   By: LKristine GarbeM.D.   On: 02/25/2016 04:13   Mr 3d Recon At Scanner  Result Date: 02/27/2016 CLINICAL DATA:  Abnormal LFTs EXAM: MRI ABDOMEN WITHOUT AND WITH CONTRAST  (INCLUDING MRCP) TECHNIQUE: Multiplanar multisequence MR imaging of the abdomen was performed both before and after the administration of intravenous contrast. Heavily T2-weighted images of the biliary and pancreatic ducts were obtained, and three-dimensional MRCP images were rendered by post processing. CONTRAST:  168mMULTIHANCE GADOBENATE DIMEGLUMINE 529 MG/ML IV SOLN COMPARISON:  CT 02/25/2016, ultrasound 02/24/2016 FINDINGS: Lower chest: Small bilateral pleural effusions left greater than right are visualized. Probable atelectasis at the posterior lung bases and within the lingula. Increased T2 signal in the pericardium is felt secondary to inhomogeneous fat suppression. Hepatobiliary: Signal intensity is within normal limits. No focal hepatic abnormality is visualized. No focal contrast-enhancing lesions. Mild central intra hepatic biliary dilatation. No discrete filling defects are visualized within the slightly dilated intra hepatic ducts. Extrahepatic common bile duct is dilated up to 9 mm. Best seen on the axial T2 fat suppressed images is a 3 mm hypo intensity within the distal common bile duct at the head of the pancreas, series 4, image number 30. No additional filling defects are visualized along the course of the common bile duct. No focal filling defects within the imaged gallbladder. No surrounding gallbladder edema. No ductal enhancement. Pancreas: No enlargement of the pancreatic duct. No peripancreatic fluid collections. Spleen:  Normal in  size.  No focal abnormalities. Adrenals/Urinary Tract: Adrenal glands are within normal limits. Mild perinephric edema, nonspecific. No focal renal lesions are visualized. Stomach/Bowel: Limited evaluation by MRI. Small moderate hiatal hernia. No grossly dilated bowel in the upper abdomen. No bowel wall edema. Vascular/Lymphatic: Imaged aorta within normal limits for size. No abnormally enlarged lymph nodes. Other: Small amount of free fluid around the spleen.  Small amount of subcutaneous edema within the lateral abdominal wall and posterior subcutaneous soft tissues. Musculoskeletal: No significant marrow edema. IMPRESSION: 1. Mild central intra hepatic biliary dilatation and mild dilatation of extrahepatic common bile duct, measuring up to 9 mm. Tiny 3 mm hypo intense filling defect within the distal common bile duct at the head of the pancreas, could reflect a small stone. Otherwise no additional hypo intense filling defects, debris, or abnormal ductal enhancement identified. 2. Small bilateral pleural effusions. Small amount of free fluid in the upper abdomen. Electronically Signed   By: Donavan Foil M.D.   On: 02/27/2016 19:57   Dg Chest Portable 1 View  Result Date: 02/16/2016 CLINICAL DATA:  Left-sided chest pain, bloody sputum with cough. EXAM: PORTABLE CHEST 1 VIEW COMPARISON:  Exams dating back through 12/27/2014 FINDINGS: There is mild diffuse interstitial edema with small left effusion. Atelectatic appearing left lung with volume loss and mediastinal as well as cardiac shift to left with compensatory hyperinflation of the right lung. A double density over the cardiac silhouette cannot exclude a hiatal hernia. Heart is normal in size. There is aortic atherosclerosis. IMPRESSION: Volume loss with shift of the mediastinum and heart to the left. Interstitial pulmonary edema with probable small left pleural effusion. A double density over the cardiac silhouette raises the possibility of a hiatal hernia although atelectatic lung might also contribute to this appearance. Electronically Signed   By: Ashley Royalty M.D.   On: 02/16/2016 19:38   Dg Ercp Biliary & Pancreatic Ducts  Result Date: 02/29/2016 CLINICAL DATA:  ERCP with sphincterotomy for stone removal. EXAM: ERCP TECHNIQUE: Multiple spot images obtained with the fluoroscopic device and submitted for interpretation post-procedure. FLUOROSCOPY TIME:  Fluoroscopy Time:  3 minutes and 44 seconds Radiation  Exposure Index (if provided by the fluoroscopic device): 52.53 mGy COMPARISON:  MRI 02/27/2016 FINDINGS: First image demonstrates opacification of the main pancreatic duct. Cannulation and opacification of the biliary system. Mild dilatation of the biliary system. No large filling defects or stones are identified. IMPRESSION: ERCP images for stone removal. Mild dilatation of the biliary system. These images were submitted for radiologic interpretation only. Please see the procedural report for the amount of contrast and the fluoroscopy time utilized. Electronically Signed   By: Markus Daft M.D.   On: 02/29/2016 13:48   Mr Abdomen Mrcp Moise Boring Contast  Result Date: 02/27/2016 CLINICAL DATA:  Abnormal LFTs EXAM: MRI ABDOMEN WITHOUT AND WITH CONTRAST (INCLUDING MRCP) TECHNIQUE: Multiplanar multisequence MR imaging of the abdomen was performed both before and after the administration of intravenous contrast. Heavily T2-weighted images of the biliary and pancreatic ducts were obtained, and three-dimensional MRCP images were rendered by post processing. CONTRAST:  84m MULTIHANCE GADOBENATE DIMEGLUMINE 529 MG/ML IV SOLN COMPARISON:  CT 02/25/2016, ultrasound 02/24/2016 FINDINGS: Lower chest: Small bilateral pleural effusions left greater than right are visualized. Probable atelectasis at the posterior lung bases and within the lingula. Increased T2 signal in the pericardium is felt secondary to inhomogeneous fat suppression. Hepatobiliary: Signal intensity is within normal limits. No focal hepatic abnormality is visualized. No focal contrast-enhancing lesions. Mild  central intra hepatic biliary dilatation. No discrete filling defects are visualized within the slightly dilated intra hepatic ducts. Extrahepatic common bile duct is dilated up to 9 mm. Best seen on the axial T2 fat suppressed images is a 3 mm hypo intensity within the distal common bile duct at the head of the pancreas, series 4, image number 30. No  additional filling defects are visualized along the course of the common bile duct. No focal filling defects within the imaged gallbladder. No surrounding gallbladder edema. No ductal enhancement. Pancreas: No enlargement of the pancreatic duct. No peripancreatic fluid collections. Spleen:  Normal in size.  No focal abnormalities. Adrenals/Urinary Tract: Adrenal glands are within normal limits. Mild perinephric edema, nonspecific. No focal renal lesions are visualized. Stomach/Bowel: Limited evaluation by MRI. Small moderate hiatal hernia. No grossly dilated bowel in the upper abdomen. No bowel wall edema. Vascular/Lymphatic: Imaged aorta within normal limits for size. No abnormally enlarged lymph nodes. Other: Small amount of free fluid around the spleen. Small amount of subcutaneous edema within the lateral abdominal wall and posterior subcutaneous soft tissues. Musculoskeletal: No significant marrow edema. IMPRESSION: 1. Mild central intra hepatic biliary dilatation and mild dilatation of extrahepatic common bile duct, measuring up to 9 mm. Tiny 3 mm hypo intense filling defect within the distal common bile duct at the head of the pancreas, could reflect a small stone. Otherwise no additional hypo intense filling defects, debris, or abnormal ductal enhancement identified. 2. Small bilateral pleural effusions. Small amount of free fluid in the upper abdomen. Electronically Signed   By: Donavan Foil M.D.   On: 02/27/2016 19:57   US Abdomen Limited Ruq  Result Date: 02/24/2016 CLINICAL DATA:  Postprandial right upper quadrant and upper abdominal pain beginning on 02/23/2016. History of aortic stenosis, chronic kidney disease, and hypertension. EXAM: US ABDOMEN LIMITED - RIGHT UPPER QUADRANT COMPARISON:  None. FINDINGS: Gallbladder: Limited visualization. No gallstones or wall thickening visualized. No sonographic Murphy sign noted by sonographer. Common bile duct: Diameter: Ranges from 3.8 to 8.4 mm. No  filling defects demonstrated. Liver: Limited visualization. No focal lesion identified. Liver parenchymal echotexture appears diffusely decreased. Examination is technically limited, likely due to combination of patient body habitus and labored breathing. IMPRESSION: Technically limited examination with poor penetration of the liver and gallbladder but no gross evidence of cholelithiasis or cholecystitis. Distal bile ducts are at the upper limits of normal range but no significant bile duct dilatation. Electronically Signed   By: Lucienne Capers M.D.   On: 02/24/2016 23:22    Microbiology: Recent Results (from the past 240 hour(s))  Culture, blood (routine x 2) Call MD if unable to obtain prior to antibiotics being given     Status: None   Collection Time: 02/24/16  8:56 PM  Result Value Ref Range Status   Specimen Description BLOOD LEFT ARM  Final   Special Requests IN PEDIATRIC BOTTLE 2 CC  Final   Culture   Final    NO GROWTH 5 DAYS Performed at [facility]    Report Status 02/29/2016 FINAL  Final  Culture, blood (routine x 2) Call MD if unable to obtain prior to antibiotics being given     Status: None   Collection Time: 02/24/16  8:56 PM  Result Value Ref Range Status   Specimen Description BLOOD LEFT ARM  Final   Special Requests IN PEDIATRIC BOTTLE 2 CC  Final   Culture   Final    NO GROWTH 5 DAYS Performed at [facility]    Report Status 02/29/2016 FINAL  Final  MRSA PCR Screening     Status: None   Collection Time: 02/24/16  9:25 PM  Result Value Ref Range Status   MRSA by PCR NEGATIVE NEGATIVE Final    Comment:        The GeneXpert MRSA Assay (FDA approved for NASAL specimens only), is one component of a comprehensive MRSA colonization surveillance program. It is not intended to diagnose MRSA infection nor to guide or monitor treatment for MRSA infections.      Labs: Basic Metabolic Panel:  Recent Labs Lab 02/27/16 0159 02/28/16 0448  02/29/16 0458 03/01/16 0456 03/02/16 0444  NA 139 145 141 143 142  K 3.4* 4.6 4.0 4.1 4.0  CL 97* 105 101 103 101  CO2 36* 32 33* 32 34*  GLUCOSE 89 65 90 86 120*  BUN 25* '19 16 12 12  '$ CREATININE 1.03* 0.90 0.94 0.94 0.96  CALCIUM 8.3* 8.3* 8.0* 7.7* 8.2*  MG  --   --  1.8 1.7  --    Liver Function Tests:  Recent Labs Lab 02/27/16 0159 02/28/16 0448 02/29/16 0458 03/01/16 0456 03/02/16 0444  AST 89* 42* 49* 52* 112*  ALT 198* 131* 104* 89* 102*  ALKPHOS 132* 132* 144* 126 233*  BILITOT 0.9 0.8 0.4 0.7 0.8  PROT 6.4* 6.1* 5.8* 5.5* 5.9*  ALBUMIN 3.1* 3.0* 2.8* 2.8* 2.9*    Recent Labs Lab 02/24/16 1901  LIPASE 20   No results for input(s): AMMONIA in the last 168 hours. CBC:  Recent Labs Lab 02/25/16 0518  02/27/16 0159 02/28/16 0448 02/29/16 0458 03/01/16 0456 03/02/16 0444  WBC 11.3*  < > 8.7 6.1 5.9 6.4 11.8*  NEUTROABS 10.6*  --   --   --  3.4  --   --   HGB 8.5*  < > 8.1* 8.7* 8.6* 8.2* 8.2*  HCT 29.4*  < > 27.2* 30.0* 29.5* 28.3* 28.6*  MCV 90.5  < > 86.9 91.7 90.5 91.0 91.4  PLT 304  < > 308 318 308 259 293  < > = values in this interval not displayed. Cardiac Enzymes: No results for input(s): CKTOTAL, CKMB, CKMBINDEX, TROPONINI in the last 168 hours. BNP: BNP (last 3 results)  Recent Labs  02/16/16 1850  BNP 109.8*    ProBNP (last 3 results) No results for input(s): PROBNP in the last 8760 hours.  CBG: No results for input(s): GLUCAP in the last 168 hours.     SignedIrine Seal MD.  [facility] 03/02/2016, 2:25 PM

## 2016-03-02 NOTE — Progress Notes (Signed)
Pt / [name] are in agreement with d/c to [facility] today. [facility] transport is required. Medical necessity form completed. Scripts included in d/c packet. D/C Summary sent to SNF for review. # for report provided to nsg.  Werner Lean LCSW [phone]

## 2016-03-02 NOTE — Progress Notes (Signed)
2 Days Post-Op  Subjective: She feels fine. She told Dr. Grandville Silos she had a little discomfort last PM, no pain this AM.   No nausea or discomfort with supper or breakfast, she just had coffee for breakfast.  Objective: Vital signs in last 24 hours: Temp:  [98.8 F (37.1 C)-99.2 F (37.3 C)] 98.8 F (37.1 C) (11/30 2215) Pulse Rate:  [67] 67 (11/30 2215) Resp:  [18] 18 (11/30 2215) BP: (129-135)/(54) 135/54 (11/30 2215) SpO2:  [95 %-100 %] 99 % (12/01 0913) Last BM Date: 03/01/16 320 PO recorded  Voided x 5 BM x 2 TM 99.2 Afebrile, VSS WBC is up and her AST/ALT - Alk phos are all up this AM   Intake/Output from previous day: 11/30 0701 - 12/01 0700 In: 1297.5 [P.O.:320; I.V.:677.5; IV Piggyback:300] Out: -  Intake/Output this shift: Total I/O In: 360 [P.O.:360] Out: -   General appearance: alert, cooperative and no distress GI: soft, non-tender; bowel sounds normal; no masses,  no organomegaly  Lab Results:   Recent Labs  03/01/16 0456 03/02/16 0444  WBC 6.4 11.8*  HGB 8.2* 8.2*  HCT 28.3* 28.6*  PLT 259 293    BMET  Recent Labs  03/01/16 0456 03/02/16 0444  NA 143 142  K 4.1 4.0  CL 103 101  CO2 32 34*  GLUCOSE 86 120*  BUN 12 12  CREATININE 0.94 0.96  CALCIUM 7.7* 8.2*   PT/INR No results for input(s): LABPROT, INR in the last 72 hours.   Recent Labs Lab 02/27/16 0159 02/28/16 0448 02/29/16 0458 03/01/16 0456 03/02/16 0444  AST 89* 42* 49* 52* 112*  ALT 198* 131* 104* 89* 102*  ALKPHOS 132* 132* 144* 126 233*  BILITOT 0.9 0.8 0.4 0.7 0.8  PROT 6.4* 6.1* 5.8* 5.5* 5.9*  ALBUMIN 3.1* 3.0* 2.8* 2.8* 2.9*     Lipase     Component Value Date/Time   LIPASE 20 02/24/2016 1901     Studies/Results: Dg Ercp Biliary & Pancreatic Ducts  Result Date: 02/29/2016 CLINICAL DATA:  ERCP with sphincterotomy for stone removal. EXAM: ERCP TECHNIQUE: Multiple spot images obtained with the fluoroscopic device and submitted for interpretation  post-procedure. FLUOROSCOPY TIME:  Fluoroscopy Time:  3 minutes and 44 seconds Radiation Exposure Index (if provided by the fluoroscopic device): 52.53 mGy COMPARISON:  MRI 02/27/2016 FINDINGS: First image demonstrates opacification of the main pancreatic duct. Cannulation and opacification of the biliary system. Mild dilatation of the biliary system. No large filling defects or stones are identified. IMPRESSION: ERCP images for stone removal. Mild dilatation of the biliary system. These images were submitted for radiologic interpretation only. Please see the procedural report for the amount of contrast and the fluoroscopy time utilized. Electronically Signed   By: Markus Daft M.D.   On: 02/29/2016 13:48    Medications: . ampicillin-sulbactam (UNASYN) IV  3 g Intravenous Q6H  . clobetasol cream   Topical 2 times per day on Mon Tue Wed Thu  . fluticasone  1 spray Each Nare Daily  . guaiFENesin  600 mg Oral BID  . hydrocortisone cream   Topical 2 times per day on Mon Tue Wed Thu  . lactose free nutrition  237 mL Oral BID BM  . levalbuterol  0.63 mg Nebulization TID  . pantoprazole  40 mg Oral Daily  . tiotropium  18 mcg Inhalation Daily    Assessment/Plan Choledocholithiasis with cholangitis. S/p ERCP, common duct stone extraction, and sphincterotomy, 02/19/16, Dr. Henrene Pastor Elevated LFT's trended back up today  Atrial fibrillation on Xarelto at home O2 dependent COPD Chronic kidney disease Anemia  FEN: full liquids JL:LVDIXVEZ x 3 days completed 11/26; Unasyn started 02/27/16, day 4 yesterday DVT: Heparin per pharmacy   Plan:  Dr. Henrene Pastor and Dr. Excell Seltzer will see and discuss next step.      LOS: 7 days    Paula Contreras 03/02/2016 [phone]

## 2016-03-07 ENCOUNTER — Emergency Department (HOSPITAL_COMMUNITY): Payer: Medicare Other

## 2016-03-07 ENCOUNTER — Encounter (HOSPITAL_COMMUNITY): Payer: Self-pay | Admitting: Emergency Medicine

## 2016-03-07 ENCOUNTER — Inpatient Hospital Stay (HOSPITAL_COMMUNITY)
Admission: EM | Admit: 2016-03-07 | Discharge: 2016-03-13 | DRG: 871 | Disposition: A | Discharge status: Skilled Nursing Facility | Payer: Medicare Other | Attending: Internal Medicine | Admitting: Internal Medicine

## 2016-03-07 DIAGNOSIS — Z85828 Personal history of other malignant neoplasm of skin: Secondary | ICD-10-CM

## 2016-03-07 DIAGNOSIS — D62 Acute posthemorrhagic anemia: Secondary | ICD-10-CM | POA: Diagnosis present

## 2016-03-07 DIAGNOSIS — I248 Other forms of acute ischemic heart disease: Secondary | ICD-10-CM | POA: Diagnosis present

## 2016-03-07 DIAGNOSIS — Z7901 Long term (current) use of anticoagulants: Secondary | ICD-10-CM

## 2016-03-07 DIAGNOSIS — D649 Anemia, unspecified: Secondary | ICD-10-CM | POA: Diagnosis not present

## 2016-03-07 DIAGNOSIS — J449 Chronic obstructive pulmonary disease, unspecified: Secondary | ICD-10-CM | POA: Diagnosis present

## 2016-03-07 DIAGNOSIS — I480 Paroxysmal atrial fibrillation: Secondary | ICD-10-CM | POA: Diagnosis present

## 2016-03-07 DIAGNOSIS — F0390 Unspecified dementia without behavioral disturbance: Secondary | ICD-10-CM | POA: Diagnosis present

## 2016-03-07 DIAGNOSIS — N179 Acute kidney failure, unspecified: Secondary | ICD-10-CM | POA: Diagnosis present

## 2016-03-07 DIAGNOSIS — I5189 Other ill-defined heart diseases: Secondary | ICD-10-CM

## 2016-03-07 DIAGNOSIS — N183 Chronic kidney disease, stage 3 unspecified: Secondary | ICD-10-CM | POA: Diagnosis present

## 2016-03-07 DIAGNOSIS — J440 Chronic obstructive pulmonary disease with acute lower respiratory infection: Secondary | ICD-10-CM | POA: Diagnosis present

## 2016-03-07 DIAGNOSIS — Z885 Allergy status to narcotic agent status: Secondary | ICD-10-CM

## 2016-03-07 DIAGNOSIS — R0600 Dyspnea, unspecified: Secondary | ICD-10-CM

## 2016-03-07 DIAGNOSIS — A419 Sepsis, unspecified organism: Secondary | ICD-10-CM | POA: Diagnosis not present

## 2016-03-07 DIAGNOSIS — J9621 Acute and chronic respiratory failure with hypoxia: Secondary | ICD-10-CM | POA: Diagnosis present

## 2016-03-07 DIAGNOSIS — Y95 Nosocomial condition: Secondary | ICD-10-CM | POA: Diagnosis present

## 2016-03-07 DIAGNOSIS — J9610 Chronic respiratory failure, unspecified whether with hypoxia or hypercapnia: Secondary | ICD-10-CM | POA: Diagnosis present

## 2016-03-07 DIAGNOSIS — I350 Nonrheumatic aortic (valve) stenosis: Secondary | ICD-10-CM | POA: Diagnosis present

## 2016-03-07 DIAGNOSIS — D638 Anemia in other chronic diseases classified elsewhere: Secondary | ICD-10-CM | POA: Diagnosis present

## 2016-03-07 DIAGNOSIS — J189 Pneumonia, unspecified organism: Secondary | ICD-10-CM | POA: Diagnosis present

## 2016-03-07 DIAGNOSIS — Z833 Family history of diabetes mellitus: Secondary | ICD-10-CM

## 2016-03-07 DIAGNOSIS — I130 Hypertensive heart and chronic kidney disease with heart failure and stage 1 through stage 4 chronic kidney disease, or unspecified chronic kidney disease: Secondary | ICD-10-CM | POA: Diagnosis present

## 2016-03-07 DIAGNOSIS — M810 Age-related osteoporosis without current pathological fracture: Secondary | ICD-10-CM | POA: Diagnosis present

## 2016-03-07 DIAGNOSIS — Z87891 Personal history of nicotine dependence: Secondary | ICD-10-CM

## 2016-03-07 DIAGNOSIS — Z79899 Other long term (current) drug therapy: Secondary | ICD-10-CM

## 2016-03-07 DIAGNOSIS — J441 Chronic obstructive pulmonary disease with (acute) exacerbation: Secondary | ICD-10-CM | POA: Diagnosis present

## 2016-03-07 DIAGNOSIS — Z9981 Dependence on supplemental oxygen: Secondary | ICD-10-CM

## 2016-03-07 DIAGNOSIS — Z841 Family history of disorders of kidney and ureter: Secondary | ICD-10-CM

## 2016-03-07 DIAGNOSIS — L8990 Pressure ulcer of unspecified site, unspecified stage: Secondary | ICD-10-CM | POA: Insufficient documentation

## 2016-03-07 DIAGNOSIS — R4182 Altered mental status, unspecified: Secondary | ICD-10-CM | POA: Diagnosis present

## 2016-03-07 DIAGNOSIS — I5032 Chronic diastolic (congestive) heart failure: Secondary | ICD-10-CM | POA: Diagnosis present

## 2016-03-07 DIAGNOSIS — R0902 Hypoxemia: Secondary | ICD-10-CM

## 2016-03-07 DIAGNOSIS — Z888 Allergy status to other drugs, medicaments and biological substances status: Secondary | ICD-10-CM

## 2016-03-07 DIAGNOSIS — Z66 Do not resuscitate: Secondary | ICD-10-CM | POA: Diagnosis present

## 2016-03-07 HISTORY — DX: Pneumonia, unspecified organism: J18.9

## 2016-03-07 LAB — COMPREHENSIVE METABOLIC PANEL
ALBUMIN: 2.9 g/dL — AB (ref 3.5–5.0)
ALT: 26 U/L (ref 14–54)
ANION GAP: 14 (ref 5–15)
AST: 25 U/L (ref 15–41)
Alkaline Phosphatase: 114 U/L (ref 38–126)
BILIRUBIN TOTAL: 0.4 mg/dL (ref 0.3–1.2)
BUN: 18 mg/dL (ref 6–20)
CO2: 36 mmol/L — ABNORMAL HIGH (ref 22–32)
Calcium: 10.3 mg/dL (ref 8.9–10.3)
Chloride: 91 mmol/L — ABNORMAL LOW (ref 101–111)
Creatinine, Ser: 1.19 mg/dL — ABNORMAL HIGH (ref 0.44–1.00)
GFR, EST AFRICAN AMERICAN: 49 mL/min — AB (ref >60)
GFR, EST NON AFRICAN AMERICAN: 42 mL/min — AB (ref >60)
Glucose, Bld: 126 mg/dL — ABNORMAL HIGH (ref 65–99)
POTASSIUM: 3.9 mmol/L (ref 3.5–5.1)
SODIUM: 141 mmol/L (ref 135–145)
TOTAL PROTEIN: 6.8 g/dL (ref 6.5–8.1)

## 2016-03-07 LAB — PROTIME-INR
INR: 1.01
PROTHROMBIN TIME: 13.3 s (ref 11.4–15.2)

## 2016-03-07 LAB — CBC WITH DIFFERENTIAL/PLATELET
BASOS ABS: 0.1 10*3/uL (ref 0.0–0.1)
BASOS PCT: 1 %
EOS ABS: 0 10*3/uL (ref 0.0–0.7)
EOS PCT: 0 %
HCT: 31.7 % — ABNORMAL LOW (ref 36.0–46.0)
Hemoglobin: 9.3 g/dL — ABNORMAL LOW (ref 12.0–15.0)
LYMPHS PCT: 12 %
Lymphs Abs: 1.6 10*3/uL (ref 0.7–4.0)
MCH: 26.3 pg (ref 26.0–34.0)
MCHC: 29.3 g/dL — ABNORMAL LOW (ref 30.0–36.0)
MCV: 89.8 fL (ref 78.0–100.0)
Monocytes Absolute: 1 10*3/uL (ref 0.1–1.0)
Monocytes Relative: 7 %
Neutro Abs: 10.5 10*3/uL — ABNORMAL HIGH (ref 1.7–7.7)
Neutrophils Relative %: 80 %
PLATELETS: 444 10*3/uL — AB (ref 150–400)
RBC: 3.53 MIL/uL — AB (ref 3.87–5.11)
RDW: 15.1 % (ref 11.5–15.5)
WBC: 13.2 10*3/uL — AB (ref 4.0–10.5)

## 2016-03-07 LAB — I-STAT CG4 LACTIC ACID, ED: Lactic Acid, Venous: 2.34 mmol/L (ref 0.5–1.9)

## 2016-03-07 MED ORDER — DEXTROSE 5 % IV SOLN
2.0000 g | Freq: Once | INTRAVENOUS | Status: AC
  Administered 2016-03-08: 2 g via INTRAVENOUS
  Filled 2016-03-07 (×2): qty 2

## 2016-03-07 MED ORDER — SODIUM CHLORIDE 0.9 % IV BOLUS (SEPSIS)
1000.0000 mL | Freq: Once | INTRAVENOUS | Status: AC
  Administered 2016-03-07: 1000 mL via INTRAVENOUS

## 2016-03-07 MED ORDER — SODIUM CHLORIDE 0.9 % IV BOLUS (SEPSIS)
1000.0000 mL | Freq: Once | INTRAVENOUS | Status: AC
  Administered 2016-03-08: 1000 mL via INTRAVENOUS

## 2016-03-07 MED ORDER — METHYLPREDNISOLONE SODIUM SUCC 125 MG IJ SOLR
125.0000 mg | Freq: Once | INTRAMUSCULAR | Status: AC
  Administered 2016-03-07: 125 mg via INTRAVENOUS
  Filled 2016-03-07: qty 2

## 2016-03-07 MED ORDER — VANCOMYCIN HCL IN DEXTROSE 1-5 GM/200ML-% IV SOLN
1000.0000 mg | Freq: Once | INTRAVENOUS | Status: AC
  Administered 2016-03-07: 1000 mg via INTRAVENOUS
  Filled 2016-03-07: qty 200

## 2016-03-07 MED ORDER — IPRATROPIUM-ALBUTEROL 0.5-2.5 (3) MG/3ML IN SOLN
3.0000 mL | Freq: Once | RESPIRATORY_TRACT | Status: AC
  Administered 2016-03-07: 3 mL via RESPIRATORY_TRACT
  Filled 2016-03-07: qty 3

## 2016-03-07 MED ORDER — SODIUM CHLORIDE 0.9 % IV BOLUS (SEPSIS)
250.0000 mL | Freq: Once | INTRAVENOUS | Status: AC
  Administered 2016-03-08: 250 mL via INTRAVENOUS

## 2016-03-07 NOTE — ED Provider Notes (Signed)
[facility] DEPT Provider Note   CSN: [id number] Arrival date & time: 03/07/16  2236     History   Chief Complaint Chief Complaint  Patient presents with  . Shortness of Breath  . Altered Mental Status    HPI Paula Contreras is a 82 y.o. female.  She was transferred from nursing home because of hypoxia and altered mental status. She is supposed to be on 2 L/m nasal cannula oxygen and was found with O2 saturation of 43% on that. Oxygen saturation came up to 98% when she was increased to 5 L. She was noted to have altered mentation. Patient is oriented to person but not place or time and is unable to give me any history. She had been at nursing home after being, lessened from ascending cholangitis and healthcare associated pneumonia.   The history is provided by the nursing home.  Shortness of Breath   Altered Mental Status      Past Medical History:  Diagnosis Date  . Anemia 02/20/2015  . Aortic stenosis    a. 2D Echo 07/2013: EF 60-65%, no RWMA, grade 1 DD, normal LV filling pressure, moderate AS, mild TR, PASP 87mHg.  . Bradycardia    a. possible bradycardia with HR 44 by physical exam notes at nephrologist office in 03/2014. F/u event monitor showed NSR PACs average HR 81, bradycardia <1% of readable data, no pauses of 3 seconds or longer..  . Cataract   . Chronic respiratory failure ([facility]    a. On home O2 since 2012.  . CKD (chronic kidney disease), stage III   . COPD (chronic obstructive pulmonary disease) ([facility]   . Dementia   . Hypertension   . Osteoporosis   . Other psoriasis and similar disorders    psoriasis vs eczema  . Paroxysmal atrial fibrillation ([facility]    a. Dx 07/2013. No attempts per Epic to restore NSR but in NSR 2016.  . Skin cancer     Patient Active Problem List   Diagnosis Date Noted  . Abnormal LFTs   . Bile duct stone 02/25/2016  . Chronic anticoagulation - Xerelto 02/25/2016  . Ascending cholangitis due to choledocolithiasis 02/25/2016    . HCAP (healthcare-associated pneumonia) 02/24/2016  . LFTs abnormal 02/24/2016  . Epigastric pain 02/24/2016  . Nausea and vomiting 02/24/2016  . History of bradycardia 10/27/2015  . SOB (shortness of breath)   . Encounter for palliative care   . Hypernatremia 02/21/2015  . Altered mental status 02/20/2015  . Metabolic encephalopathy 161/44/3154 . Weakness generalized 02/20/2015  . Dehydration 02/20/2015  . Leukocytosis 02/20/2015  . Anemia 02/20/2015  . Bronchitis 02/20/2015  . Fall at nursing home 02/20/2015  . Bradycardia 04/09/2014  . Chronic respiratory failure ([facility]   . Aortic stenosis   . PAF (paroxysmal atrial fibrillation) ([place]   . CKD (chronic kidney disease), stage III   . Aortic valve disorder 10/30/2013  . Undiagnosed cardiac murmurs 07/21/2013  . Hypertension 10/02/2010  . COPD (chronic obstructive pulmonary disease) ([facility] 10/02/2010    Past Surgical History:  Procedure Laterality Date  . BREAST SURGERY Right 1964   tumor removed  . CATARACT EXTRACTION Bilateral   . ERCP N/A 02/29/2016   Procedure: ENDOSCOPIC RETROGRADE CHOLANGIOPANCREATOGRAPHY (ERCP);  Surgeon: JIrene Shipper MD;  [facility];  Service: Endoscopy;  Laterality: N/A;  . HIP SURGERY Right    fracture    OB History    No data available       Home Medications  Prior to Admission medications   Medication Sig Start Date End Date Taking? Authorizing Provider  acetaminophen (TYLENOL) 325 MG tablet Take 650 mg by mouth every 6 (six) hours as needed (for pain).    Yes Historical Provider, MD  albuterol (PROAIR HFA) 108 (90 BASE) MCG/ACT inhaler Inhale 2 puffs into the lungs every 4 (four) hours as needed for wheezing or shortness of breath. 12/24/14  Yes Collene Gobble, MD  alendronate (FOSAMAX) 70 MG tablet Take 70 mg by mouth every Monday. Take with a full glass of water on an empty stomach.   Yes Historical Provider, MD  amoxicillin-clavulanate (AUGMENTIN) 875-125 MG tablet  Take 1 tablet by mouth every 12 (twelve) hours. Take for 6 days then stop. 03/02/16 03/08/16 Yes Eugenie Filler, MD  calcium carbonate (OS-CAL) 600 MG TABS tablet Take 2 tablets by mouth daily with breakfast.    Yes Historical Provider, MD  Cholecalciferol (VITAMIN D) 2000 UNITS CAPS Take 2,000 Units by mouth daily with breakfast.    Yes Historical Provider, MD  clobetasol (TEMOVATE) 0.05 % external solution apply 4 drops to scalp one daily Monday- Thursday for psoriasis   Yes Historical Provider, MD  esomeprazole (NEXIUM) 20 MG capsule Take 20 mg by mouth daily at 12 noon.   Yes Historical Provider, MD  feeding supplement (BOOST / RESOURCE BREEZE) LIQD Take 1 Container by mouth 2 (two) times daily between meals.   Yes Historical Provider, MD  fluticasone (FLONASE) 50 MCG/ACT nasal spray Place 1 spray into both nostrils daily. 03/03/16  Yes Eugenie Filler, MD  ipratropium (ATROVENT) 0.02 % nebulizer solution Take 2.5 mLs (0.5 mg total) by nebulization every 2 (two) hours as needed for wheezing or shortness of breath. Patient taking differently: Take 0.5 mg by nebulization every 6 (six) hours as needed for wheezing or shortness of breath.  02/23/15  Yes Theodis Blaze, MD  levalbuterol Penne Lash) 0.63 MG/3ML nebulizer solution Take 3 mLs (0.63 mg total) by nebulization every 6 (six) hours as needed for wheezing or shortness of breath. 03/02/16  Yes Eugenie Filler, MD  Multiple Vitamins-Iron (MULTIVITAMIN/IRON PO) Take 1 tablet by mouth daily.   Yes Historical Provider, MD  OXYGEN Inhale 2-4 L into the lungs continuous. To keep O2 stats >90%   Yes Historical Provider, MD  senna-docusate (SENOKOT-S) 8.6-50 MG tablet Take 1 tablet by mouth at bedtime as needed for mild constipation. 02/23/15  Yes Theodis Blaze, MD  Tiotropium Bromide Monohydrate (SPIRIVA RESPIMAT) 2.5 MCG/ACT AERS Inhale 2 puffs into the lungs daily.   Yes Historical Provider, MD  rivaroxaban (XARELTO) 20 MG TABS tablet Take 1 tablet  (20 mg total) by mouth daily. Resume in 1 week, 03/09/2016. 03/09/16   Eugenie Filler, MD    Family History Family History  Problem Relation Age of Onset  . Asthma Mother   . Kidney disease Mother   . Lung cancer Father   . Asthma Maternal Grandmother   . Lung cancer Paternal Uncle     father's twin brother  . Diabetes Brother     Social History Social History  Substance Use Topics  . Smoking status: Former Smoker    Packs/day: 1.00    Years: 55.00    Types: Cigarettes    Quit date: 09/25/2010  . Smokeless tobacco: Never Used  . Alcohol use No     Allergies   Codeine and Robitussin (alcohol free) [guaifenesin]   Review of Systems Review of Systems  Unable to perform  ROS: Mental status change  Respiratory: Positive for shortness of breath.      Physical Exam Updated Vital Signs BP 100/58   Pulse 105   Temp 100.3 F (37.9 C) (Rectal)   Resp 21   SpO2 93%   Physical Exam  Nursing note and vitals reviewed.  82 year old female, resting comfortably and in no acute distress. Vital signs are significant for tachycardia, borderline fever, borderline tachypnea, and borderline hypertension. Oxygen saturation is 98%, which is normal, But only maintained while on a nonrebreather oxygen mask. Head is normocephalic and atraumatic. PERRLA, EOMI. Oropharynx is clear. Neck is nontender and supple without adenopathy or JVD. Back is nontender and there is no CVA tenderness. Lungs have diminished air flow with diffuse expiratory wheezes. There are no rales or rhonchi. Chest is nontender. Heart has regular rate and rhythm with 3/6 holosystolic murmur. Abdomen is soft, flat, nontender without masses or hepatosplenomegaly and peristalsis is normoactive. Extremities have no cyanosis or edema, full range of motion is present. Skin is warm and dry without rash. Neurologic: She is awake but lethargic, oriented to person but not place or time, cranial nerves are intact, there are no  motor or sensory deficits.   ED Treatments / Results  Labs (all labs ordered are listed, but only abnormal results are displayed) Labs Reviewed  COMPREHENSIVE METABOLIC PANEL - Abnormal; Notable for the following:       Result Value   Chloride 91 (*)    CO2 36 (*)    Glucose, Bld 126 (*)    Creatinine, Ser 1.19 (*)    Albumin 2.9 (*)    GFR calc non Af Amer 42 (*)    GFR calc Af Amer 49 (*)    All other components within normal limits  CBC WITH DIFFERENTIAL/PLATELET - Abnormal; Notable for the following:    WBC 13.2 (*)    RBC 3.53 (*)    Hemoglobin 9.3 (*)    HCT 31.7 (*)    MCHC 29.3 (*)    Platelets 444 (*)    Neutro Abs 10.5 (*)    All other components within normal limits  I-STAT CG4 LACTIC ACID, ED - Abnormal; Notable for the following:    Lactic Acid, Venous 2.34 (*)    All other components within normal limits  CULTURE, BLOOD (ROUTINE X 2)  CULTURE, BLOOD (ROUTINE X 2)  URINE CULTURE  CULTURE, EXPECTORATED SPUTUM-ASSESSMENT  GRAM STAIN  PROTIME-INR  URINALYSIS, ROUTINE W REFLEX MICROSCOPIC  TROPONIN I  BRAIN NATRIURETIC PEPTIDE  STREP PNEUMONIAE URINARY ANTIGEN  PROCALCITONIN  LACTIC ACID, PLASMA  LACTIC ACID, PLASMA  LEGIONELLA PNEUMOPHILA SEROGP 1 UR AG    EKG  EKG Interpretation None       Radiology Dg Chest Portable 1 View  Result Date: 03/07/2016 CLINICAL DATA:  Hypoxia EXAM: PORTABLE CHEST 1 VIEW COMPARISON:  CXR exams dating back through 09/25/2010 FINDINGS: Pulmonary vascular redistribution consistent with mild CHF. No effusion or pneumothorax. No pulmonary consolidation. Heart is top-normal in size. There is aortic arch atherosclerosis as before. Mild prominence of the hila is stable dating back to 2012 and may reflect pulmonary hypertension. The normally hyperinflated appearance of the lungs is less so on current exam. No acute nor suspicious osseous abnormalities. IMPRESSION: Mild CHF.  Aortic atherosclerosis. Electronically Signed   By: Ashley Royalty M.D.   On: 03/07/2016 23:20    Procedures Procedures (including critical care time) CRITICAL CARE Performed by: NIOEV,[email or web address] Total critical care time: 45 minutes  Critical care time was exclusive of separately billable procedures and treating other patients. Critical care was necessary to treat or prevent imminent or life-threatening deterioration. Critical care was time spent personally by me on the following activities: development of treatment plan with patient and/or surrogate as well as nursing, discussions with consultants, evaluation of patient's response to treatment, examination of patient, obtaining history from patient or surrogate, ordering and performing treatments and interventions, ordering and review of laboratory studies, ordering and review of radiographic studies, pulse oximetry and re-evaluation of patient's condition.  Medications Ordered in ED Medications  ceFEPIme (MAXIPIME) 2 g in dextrose 5 % 50 mL IVPB (not administered)  sodium chloride 0.9 % bolus 1,000 mL (1,000 mLs Intravenous New Bag/Given 03/08/16 0027)    And  sodium chloride 0.9 % bolus 1,000 mL (0 mLs Intravenous Stopped 03/08/16 0026)    And  sodium chloride 0.9 % bolus 250 mL (not administered)  azithromycin (ZITHROMAX) 500 mg in dextrose 5 % 250 mL IVPB (not administered)  ipratropium-albuterol (DUONEB) 0.5-2.5 (3) MG/3ML nebulizer solution 3 mL (3 mLs Nebulization Given 03/07/16 2317)  methylPREDNISolone sodium succinate (SOLU-MEDROL) 125 mg/2 mL injection 125 mg (125 mg Intravenous Given 03/07/16 2317)  vancomycin (VANCOCIN) IVPB 1000 mg/200 mL premix (1,000 mg Intravenous New Bag/Given 03/07/16 2342)     Initial Impression / Assessment and Plan / ED Course  I have reviewed the triage vital signs and the nursing notes.  Pertinent labs & imaging results that were available during my care of the patient were reviewed by me and considered in my medical decision making (see chart for  details).  Clinical Course    Acute respiratory decompensation in patient with known history of recent healthcare associated pneumonia. Old records are reviewed showing that she had been discharged from the hospital on December 1 after hospital stay for cholangitis and healthcare associated pneumonia. She will be given albuterol with ipratropium for her wheezing and is given methylprednisolone. Sepsis pathway is initiated, and she was started on antibiotics for healthcare associated pneumonia. Of note, at her recent hospitalization, she was DO NOT RESUSCITATE status.  Chest x-ray does not show definite pneumonia and is read as showing evidence of CHF. BNP has been ordered. WBC is elevated with left shift. Mild elevation in creatinine is noted. Following albuterol with ipratropium, lungs are now clear and she is resting more comfortably, and maintaining adequate oxygen saturation on 3 L of nasal oxygen. Case is discussed with Dr. Loleta Books [facility] who agrees to admit the patient.  Final Clinical Impressions(s) / ED Diagnoses   Final diagnoses:  Sepsis, due to unspecified organism [facility])  Normocytic anemia  Hypoxia    New Prescriptions New Prescriptions   No medications on file     Delora Fuel, MD 59/97/74 1423

## 2016-03-07 NOTE — ED Triage Notes (Signed)
Per EMS:  Pt picked up from [facility] where she was being treated for pneumonia.  Pt LSN at 2030.  Pt found at 2130 at 43% on her home 2L.  Pt placed on 5L and came up to 98%.  Pt was altered, unresponsive when EMS arrived, tachypneic and pale.  Wheezing noted from EMS, pt given '15mg'$  albuterol and 1 mg Atrovent en route.  Pt also given 125 solu-medrol.  Pt more alert, continues to be tachypneic and tachycardic.  Pt is a DNR.

## 2016-03-07 NOTE — ED Notes (Signed)
Keeping pt on O2 mask but titrated the rate from 10L to 5L to see how the pt responds.

## 2016-03-08 ENCOUNTER — Encounter (HOSPITAL_COMMUNITY): Payer: Self-pay | Admitting: Family Medicine

## 2016-03-08 DIAGNOSIS — M810 Age-related osteoporosis without current pathological fracture: Secondary | ICD-10-CM | POA: Diagnosis present

## 2016-03-08 DIAGNOSIS — I5032 Chronic diastolic (congestive) heart failure: Secondary | ICD-10-CM | POA: Diagnosis present

## 2016-03-08 DIAGNOSIS — J9621 Acute and chronic respiratory failure with hypoxia: Secondary | ICD-10-CM | POA: Diagnosis present

## 2016-03-08 DIAGNOSIS — I130 Hypertensive heart and chronic kidney disease with heart failure and stage 1 through stage 4 chronic kidney disease, or unspecified chronic kidney disease: Secondary | ICD-10-CM | POA: Diagnosis present

## 2016-03-08 DIAGNOSIS — J449 Chronic obstructive pulmonary disease, unspecified: Secondary | ICD-10-CM

## 2016-03-08 DIAGNOSIS — Z85828 Personal history of other malignant neoplasm of skin: Secondary | ICD-10-CM | POA: Diagnosis not present

## 2016-03-08 DIAGNOSIS — Z833 Family history of diabetes mellitus: Secondary | ICD-10-CM | POA: Diagnosis not present

## 2016-03-08 DIAGNOSIS — I480 Paroxysmal atrial fibrillation: Secondary | ICD-10-CM | POA: Diagnosis present

## 2016-03-08 DIAGNOSIS — R4182 Altered mental status, unspecified: Secondary | ICD-10-CM | POA: Diagnosis present

## 2016-03-08 DIAGNOSIS — J440 Chronic obstructive pulmonary disease with acute lower respiratory infection: Secondary | ICD-10-CM | POA: Diagnosis present

## 2016-03-08 DIAGNOSIS — J441 Chronic obstructive pulmonary disease with (acute) exacerbation: Secondary | ICD-10-CM | POA: Diagnosis present

## 2016-03-08 DIAGNOSIS — F0390 Unspecified dementia without behavioral disturbance: Secondary | ICD-10-CM | POA: Diagnosis present

## 2016-03-08 DIAGNOSIS — J9622 Acute and chronic respiratory failure with hypercapnia: Secondary | ICD-10-CM

## 2016-03-08 DIAGNOSIS — I248 Other forms of acute ischemic heart disease: Secondary | ICD-10-CM | POA: Diagnosis present

## 2016-03-08 DIAGNOSIS — J189 Pneumonia, unspecified organism: Secondary | ICD-10-CM | POA: Diagnosis present

## 2016-03-08 DIAGNOSIS — Z9981 Dependence on supplemental oxygen: Secondary | ICD-10-CM | POA: Diagnosis not present

## 2016-03-08 DIAGNOSIS — N183 Chronic kidney disease, stage 3 (moderate): Secondary | ICD-10-CM

## 2016-03-08 DIAGNOSIS — A419 Sepsis, unspecified organism: Secondary | ICD-10-CM | POA: Diagnosis present

## 2016-03-08 DIAGNOSIS — I519 Heart disease, unspecified: Secondary | ICD-10-CM

## 2016-03-08 DIAGNOSIS — Y95 Nosocomial condition: Secondary | ICD-10-CM | POA: Diagnosis present

## 2016-03-08 DIAGNOSIS — D62 Acute posthemorrhagic anemia: Secondary | ICD-10-CM | POA: Diagnosis present

## 2016-03-08 DIAGNOSIS — D638 Anemia in other chronic diseases classified elsewhere: Secondary | ICD-10-CM | POA: Diagnosis present

## 2016-03-08 DIAGNOSIS — Z79899 Other long term (current) drug therapy: Secondary | ICD-10-CM | POA: Diagnosis not present

## 2016-03-08 DIAGNOSIS — I350 Nonrheumatic aortic (valve) stenosis: Secondary | ICD-10-CM | POA: Diagnosis present

## 2016-03-08 DIAGNOSIS — I517 Cardiomegaly: Secondary | ICD-10-CM | POA: Diagnosis not present

## 2016-03-08 DIAGNOSIS — Z7901 Long term (current) use of anticoagulants: Secondary | ICD-10-CM | POA: Diagnosis not present

## 2016-03-08 DIAGNOSIS — D649 Anemia, unspecified: Secondary | ICD-10-CM | POA: Diagnosis present

## 2016-03-08 DIAGNOSIS — I5189 Other ill-defined heart diseases: Secondary | ICD-10-CM

## 2016-03-08 DIAGNOSIS — Z841 Family history of disorders of kidney and ureter: Secondary | ICD-10-CM | POA: Diagnosis not present

## 2016-03-08 DIAGNOSIS — Z66 Do not resuscitate: Secondary | ICD-10-CM | POA: Diagnosis present

## 2016-03-08 DIAGNOSIS — N179 Acute kidney failure, unspecified: Secondary | ICD-10-CM | POA: Diagnosis present

## 2016-03-08 LAB — PROCALCITONIN: Procalcitonin: 0.28 ng/mL

## 2016-03-08 LAB — BASIC METABOLIC PANEL
ANION GAP: 12 (ref 5–15)
BUN: 17 mg/dL (ref 6–20)
CALCIUM: 8.8 mg/dL — AB (ref 8.9–10.3)
CO2: 30 mmol/L (ref 22–32)
Chloride: 99 mmol/L — ABNORMAL LOW (ref 101–111)
Creatinine, Ser: 1.08 mg/dL — ABNORMAL HIGH (ref 0.44–1.00)
GFR, EST AFRICAN AMERICAN: 55 mL/min — AB (ref >60)
GFR, EST NON AFRICAN AMERICAN: 47 mL/min — AB (ref >60)
Glucose, Bld: 165 mg/dL — ABNORMAL HIGH (ref 65–99)
POTASSIUM: 3.9 mmol/L (ref 3.5–5.1)
Sodium: 141 mmol/L (ref 135–145)

## 2016-03-08 LAB — CBC WITH DIFFERENTIAL/PLATELET
BASOS ABS: 0 10*3/uL (ref 0.0–0.1)
BASOS PCT: 0 %
EOS ABS: 0 10*3/uL (ref 0.0–0.7)
Eosinophils Relative: 0 %
HEMATOCRIT: 27.1 % — AB (ref 36.0–46.0)
HEMOGLOBIN: 8.1 g/dL — AB (ref 12.0–15.0)
Lymphocytes Relative: 5 %
Lymphs Abs: 0.7 10*3/uL (ref 0.7–4.0)
MCH: 26.4 pg (ref 26.0–34.0)
MCHC: 29.9 g/dL — AB (ref 30.0–36.0)
MCV: 88.3 fL (ref 78.0–100.0)
Monocytes Absolute: 0.1 10*3/uL (ref 0.1–1.0)
Monocytes Relative: 1 %
NEUTROS ABS: 12.7 10*3/uL — AB (ref 1.7–7.7)
NEUTROS PCT: 94 %
Platelets: 353 10*3/uL (ref 150–400)
RBC: 3.07 MIL/uL — AB (ref 3.87–5.11)
RDW: 15.4 % (ref 11.5–15.5)
WBC: 13.5 10*3/uL — AB (ref 4.0–10.5)

## 2016-03-08 LAB — MAGNESIUM: MAGNESIUM: 1.7 mg/dL (ref 1.7–2.4)

## 2016-03-08 LAB — I-STAT ARTERIAL BLOOD GAS, ED
ACID-BASE EXCESS: 10 mmol/L — AB (ref 0.0–2.0)
Bicarbonate: 37 mmol/L — ABNORMAL HIGH (ref 20.0–28.0)
O2 Saturation: 96 %
PO2 ART: 92 mmHg (ref 83.0–108.0)
TCO2: 39 mmol/L (ref 0–100)
pCO2 arterial: 66.3 mmHg (ref 32.0–48.0)
pH, Arterial: 7.355 (ref 7.350–7.450)

## 2016-03-08 LAB — I-STAT CG4 LACTIC ACID, ED
LACTIC ACID, VENOUS: 2.88 mmol/L — AB (ref 0.5–1.9)
LACTIC ACID, VENOUS: 3.89 mmol/L — AB (ref 0.5–1.9)

## 2016-03-08 LAB — LACTIC ACID, PLASMA
Lactic Acid, Venous: 2.1 mmol/L (ref 0.5–1.9)
Lactic Acid, Venous: 2.8 mmol/L (ref 0.5–1.9)
Lactic Acid, Venous: 2.5 mmol/L (ref 0.5–1.9)

## 2016-03-08 LAB — STREP PNEUMONIAE URINARY ANTIGEN: STREP PNEUMO URINARY ANTIGEN: NEGATIVE

## 2016-03-08 LAB — BRAIN NATRIURETIC PEPTIDE: B Natriuretic Peptide: 200.3 pg/mL — ABNORMAL HIGH (ref 0.0–100.0)

## 2016-03-08 LAB — TROPONIN I: Troponin I: 0.09 ng/mL (ref <0.03)

## 2016-03-08 MED ORDER — VANCOMYCIN HCL IN DEXTROSE 1-5 GM/200ML-% IV SOLN
1000.0000 mg | INTRAVENOUS | Status: DC
  Administered 2016-03-08 – 2016-03-09 (×2): 1000 mg via INTRAVENOUS
  Filled 2016-03-08 (×3): qty 200

## 2016-03-08 MED ORDER — SENNOSIDES-DOCUSATE SODIUM 8.6-50 MG PO TABS
1.0000 | ORAL_TABLET | Freq: Every evening | ORAL | Status: DC | PRN
  Filled 2016-03-08: qty 1

## 2016-03-08 MED ORDER — METHYLPREDNISOLONE SODIUM SUCC 125 MG IJ SOLR
60.0000 mg | Freq: Every day | INTRAMUSCULAR | Status: DC
  Administered 2016-03-08: 60 mg via INTRAVENOUS
  Filled 2016-03-08: qty 2

## 2016-03-08 MED ORDER — PANTOPRAZOLE SODIUM 40 MG PO TBEC
40.0000 mg | DELAYED_RELEASE_TABLET | Freq: Every day | ORAL | Status: DC
  Administered 2016-03-08 – 2016-03-13 (×6): 40 mg via ORAL
  Filled 2016-03-08 (×6): qty 1

## 2016-03-08 MED ORDER — DEXTROSE 5 % IV SOLN
500.0000 mg | INTRAVENOUS | Status: DC
  Administered 2016-03-08 – 2016-03-10 (×3): 500 mg via INTRAVENOUS
  Filled 2016-03-08 (×3): qty 500

## 2016-03-08 MED ORDER — BOOST / RESOURCE BREEZE PO LIQD
1.0000 | Freq: Two times a day (BID) | ORAL | Status: DC
  Administered 2016-03-09 – 2016-03-11 (×3): 1 via ORAL
  Filled 2016-03-08: qty 1

## 2016-03-08 MED ORDER — TIOTROPIUM BROMIDE MONOHYDRATE 18 MCG IN CAPS
18.0000 ug | ORAL_CAPSULE | Freq: Every day | RESPIRATORY_TRACT | Status: DC
  Filled 2016-03-08: qty 5

## 2016-03-08 MED ORDER — ONDANSETRON HCL 4 MG/2ML IJ SOLN: 4.0000 mg | Freq: Four times a day (QID) | INTRAMUSCULAR | Status: DC | PRN

## 2016-03-08 MED ORDER — RIVAROXABAN 20 MG PO TABS
20.0000 mg | ORAL_TABLET | Freq: Every day | ORAL | Status: DC
  Administered 2016-03-09: 20 mg via ORAL
  Filled 2016-03-08: qty 1

## 2016-03-08 MED ORDER — CEFEPIME HCL 2 G IJ SOLR
2.0000 g | INTRAMUSCULAR | Status: DC
  Administered 2016-03-09 (×2): 2 g via INTRAVENOUS
  Filled 2016-03-08 (×2): qty 2

## 2016-03-08 MED ORDER — SODIUM CHLORIDE 0.9 % IV SOLN
INTRAVENOUS | Status: DC
  Administered 2016-03-08 – 2016-03-09 (×3): via INTRAVENOUS

## 2016-03-08 MED ORDER — IPRATROPIUM-ALBUTEROL 0.5-2.5 (3) MG/3ML IN SOLN
3.0000 mL | Freq: Four times a day (QID) | RESPIRATORY_TRACT | Status: DC
  Administered 2016-03-08 – 2016-03-09 (×5): 3 mL via RESPIRATORY_TRACT
  Filled 2016-03-08 (×4): qty 3

## 2016-03-08 MED ORDER — SODIUM CHLORIDE 0.9 % IV BOLUS (SEPSIS)
500.0000 mL | Freq: Once | INTRAVENOUS | Status: AC
  Administered 2016-03-08: 500 mL via INTRAVENOUS

## 2016-03-08 MED ORDER — ALBUTEROL SULFATE (2.5 MG/3ML) 0.083% IN NEBU
2.5000 mg | INHALATION_SOLUTION | Freq: Four times a day (QID) | RESPIRATORY_TRACT | Status: DC
  Administered 2016-03-08: 2.5 mg via RESPIRATORY_TRACT
  Filled 2016-03-08: qty 3

## 2016-03-08 MED ORDER — ALBUTEROL SULFATE (2.5 MG/3ML) 0.083% IN NEBU: 2.5000 mg | INHALATION_SOLUTION | RESPIRATORY_TRACT | Status: DC | PRN

## 2016-03-08 MED ORDER — SODIUM CHLORIDE 0.9% FLUSH
3.0000 mL | Freq: Two times a day (BID) | INTRAVENOUS | Status: DC
  Administered 2016-03-09 – 2016-03-12 (×7): 3 mL via INTRAVENOUS

## 2016-03-08 MED ORDER — ONDANSETRON HCL 4 MG PO TABS: 4.0000 mg | ORAL_TABLET | Freq: Four times a day (QID) | ORAL | Status: DC | PRN

## 2016-03-08 MED ORDER — ACETAMINOPHEN 325 MG PO TABS: 650.0000 mg | ORAL_TABLET | Freq: Four times a day (QID) | ORAL | Status: DC | PRN

## 2016-03-08 MED ORDER — ACETAMINOPHEN 650 MG RE SUPP: 650.0000 mg | Freq: Four times a day (QID) | RECTAL | Status: DC | PRN

## 2016-03-08 MED ORDER — SODIUM CHLORIDE 0.9 % IV BOLUS (SEPSIS)
1000.0000 mL | Freq: Once | INTRAVENOUS | Status: AC
  Administered 2016-03-08: 1000 mL via INTRAVENOUS

## 2016-03-08 NOTE — ED Notes (Signed)
Call placed to Dr. Loleta Books to report lactic acid of 2.8. Telephone/verbal order read back and verified for a 577m bolus of 0.9%NS

## 2016-03-08 NOTE — ED Notes (Addendum)
Attempted report x 2 

## 2016-03-08 NOTE — ED Notes (Signed)
Paged MD of patient status and increase in lactic of 3.89

## 2016-03-08 NOTE — Progress Notes (Signed)
Pharmacy Antibiotic Note  Paula Contreras is a 83 y.o. female admitted on 03/07/2016 with pneumonia.  Pharmacy has been consulted for vancomycin and cefepime dosing.  Plan: Vanc 1g and cefepime 2g given in ED. Vancomycin '1000mg'$  IV every 24 hours.  Goal trough 15-20 mcg/mL.  Cefepime 2g IV every 24 hours.   Temp (24hrs), Avg:100.3 F (37.9 C), Min:100.3 F (37.9 C), Max:100.3 F (37.9 C)   Recent Labs Lab 03/01/16 0456 03/02/16 0444 03/07/16 2304 03/07/16 2311  WBC 6.4 11.8* 13.2*  --   CREATININE 0.94 0.96 1.19*  --   LATICACIDVEN  --   --   --  2.34*    Estimated Creatinine Clearance: 36.7 mL/min (by C-G formula based on SCr of 1.19 mg/dL (H)).    Allergies  Allergen Reactions  . Codeine Nausea And Vomiting  . Robitussin (Alcohol Free) [Guaifenesin] Other (See Comments)    Per MAR     Thank you for allowing pharmacy to be a part of this patient's care.  Wynona Neat, PharmD, BCPS  03/08/2016 12:47 AM

## 2016-03-08 NOTE — ED Notes (Signed)
Attempted report x1. 

## 2016-03-08 NOTE — Progress Notes (Signed)
Arterial results given to Jodean Lima RN

## 2016-03-08 NOTE — ED Notes (Signed)
Patient had formed BM and urinary incontinence. Patient cleaned. Linen changed.

## 2016-03-08 NOTE — ED Notes (Signed)
This RN tried to do and in and out cath on the pt. Upon taking the pt's depends off this RN noticed the pt perineal area was pink, inflamed, skin break down all throughout, pt had copious amount of white discharge. This Rn was not able to obtain urine.

## 2016-03-08 NOTE — Progress Notes (Signed)
Attempted to call for report. Cyndia Bent

## 2016-03-08 NOTE — ED Notes (Signed)
Pt SPO2 stayed at 100%,  triturating her down to 3L and putting on [place]

## 2016-03-08 NOTE — H&P (Signed)
History and Physical  Patient Name: Paula Contreras     RWE:[id number]    DOB: Jan 19, 1934    DOA: 03/07/2016 PCP: Vena Austria, MD   Patient coming from: [facility]  Chief Complaint: Hypoxic and lethargic  HPI: Paula Contreras is a 82 y.o. female with a past medical history significant for dementia, pAF on Xarelto, AS, COPD on 3L at baseline who presents with hypoxia and lethargy for one night.  Caveat that patient is lethargic and unable to provide history, and so all history is collected from nursing home and EMS via EDP.  Patient was recently admitted to hospital for ascending cholangitis, underwent ERCP with sphincterotomy, treated with Unasyn and discharged on Augmentin 5 days ago.  There were no nursing concerns at her SNF until today, staff found her obtunded.  Pulse ox was 43% on 2L and she was lethargic.  EMS noted wheezing, gave solumedrol and nebs.  ED course: -Febrile to 100.28F, heart rate 115, respirations 26+, pulse ox 100% on oxymask, BP initially 119/67, dropped to 89/50 while in the ER -Na 141, K 3.9, Cr 1.19 (baseline 1.0), WBC 13.2K, Hgb 9.3 (baseline 10) -CXR showed bilateral interstitial markings, no definite pneumonia -Lactic acid 2.3 -ECG showed sinus tachycardia -She was given 30 cc/kg fluids and vancomycin and cefepime were administered after cultures were obtained and TRH were asked to evaluate for sepsis from pneumonia     ROS: Review of Systems  Unable to perform ROS: Mental status change          Past Medical History:  Diagnosis Date  . Anemia 02/20/2015  . Aortic stenosis    a. 2D Echo 07/2013: EF 60-65%, no RWMA, grade 1 DD, normal LV filling pressure, moderate AS, mild TR, PASP 91mHg.  . Bradycardia    a. possible bradycardia with HR 44 by physical exam notes at nephrologist office in 03/2014. F/u event monitor showed NSR PACs average HR 81, bradycardia <1% of readable data, no pauses of 3 seconds or longer..  . Cataract   . Chronic  respiratory failure ([facility]    a. On home O2 since 2012.  . CKD (chronic kidney disease), stage III   . COPD (chronic obstructive pulmonary disease) ([facility]   . Dementia   . Hypertension   . Osteoporosis   . Other psoriasis and similar disorders    psoriasis vs eczema  . Paroxysmal atrial fibrillation ([facility]    a. Dx 07/2013. No attempts per Epic to restore NSR but in NSR 2016.  . Skin cancer     Past Surgical History:  Procedure Laterality Date  . BREAST SURGERY Right 1964   tumor removed  . CATARACT EXTRACTION Bilateral   . ERCP N/A 02/29/2016   Procedure: ENDOSCOPIC RETROGRADE CHOLANGIOPANCREATOGRAPHY (ERCP);  Surgeon: JIrene Shipper MD;  [facility];  Service: Endoscopy;  Laterality: N/A;  . HIP SURGERY Right    fracture    Social History: [facility]  Unclear if she is ambulatory at baseline.  Former smoker.  Allergies  Allergen Reactions  . Codeine Nausea And Vomiting  . Robitussin (Alcohol Free) [Guaifenesin] Other (See Comments)    Per MAR    Family history: family history includes Asthma in her maternal grandmother and mother; Diabetes in her brother; Kidney disease in her mother; Lung cancer in her father and paternal uncle.  Prior to Admission medications   Medication Sig Start Date End Date Taking? Authorizing Provider  acetaminophen (TYLENOL) 325 MG tablet Take 650  mg by mouth every 6 (six) hours as needed (for pain).    Yes Historical Provider, MD  albuterol (PROAIR HFA) 108 (90 BASE) MCG/ACT inhaler Inhale 2 puffs into the lungs every 4 (four) hours as needed for wheezing or shortness of breath. 12/24/14  Yes Collene Gobble, MD  alendronate (FOSAMAX) 70 MG tablet Take 70 mg by mouth every Monday. Take with a full glass of water on an empty stomach.   Yes Historical Provider, MD  amoxicillin-clavulanate (AUGMENTIN) 875-125 MG tablet Take 1 tablet by mouth every 12 (twelve) hours. Take for 6 days then stop. 03/02/16 03/08/16 Yes Eugenie Filler, MD  calcium carbonate (OS-CAL) 600 MG TABS tablet Take 2 tablets by mouth daily with breakfast.    Yes Historical Provider, MD  Cholecalciferol (VITAMIN D) 2000 UNITS CAPS Take 2,000 Units by mouth daily with breakfast.    Yes Historical Provider, MD  clobetasol (TEMOVATE) 0.05 % external solution apply 4 drops to scalp one daily Monday- Thursday for psoriasis   Yes Historical Provider, MD  esomeprazole (NEXIUM) 20 MG capsule Take 20 mg by mouth daily at 12 noon.   Yes Historical Provider, MD  feeding supplement (BOOST / RESOURCE BREEZE) LIQD Take 1 Container by mouth 2 (two) times daily between meals.   Yes Historical Provider, MD  fluticasone (FLONASE) 50 MCG/ACT nasal spray Place 1 spray into both nostrils daily. 03/03/16  Yes Eugenie Filler, MD  ipratropium (ATROVENT) 0.02 % nebulizer solution Take 2.5 mLs (0.5 mg total) by nebulization every 2 (two) hours as needed for wheezing or shortness of breath. Patient taking differently: Take 0.5 mg by nebulization every 6 (six) hours as needed for wheezing or shortness of breath.  02/23/15  Yes Theodis Blaze, MD  levalbuterol Penne Lash) 0.63 MG/3ML nebulizer solution Take 3 mLs (0.63 mg total) by nebulization every 6 (six) hours as needed for wheezing or shortness of breath. 03/02/16  Yes Eugenie Filler, MD  Multiple Vitamins-Iron (MULTIVITAMIN/IRON PO) Take 1 tablet by mouth daily.   Yes Historical Provider, MD  OXYGEN Inhale 2-4 L into the lungs continuous. To keep O2 stats >90%   Yes Historical Provider, MD  senna-docusate (SENOKOT-S) 8.6-50 MG tablet Take 1 tablet by mouth at bedtime as needed for mild constipation. 02/23/15  Yes Theodis Blaze, MD  Tiotropium Bromide Monohydrate (SPIRIVA RESPIMAT) 2.5 MCG/ACT AERS Inhale 2 puffs into the lungs daily.   Yes Historical Provider, MD  rivaroxaban (XARELTO) 20 MG TABS tablet Take 1 tablet (20 mg total) by mouth daily. Resume in 1 week, 03/09/2016. 03/09/16   Eugenie Filler, MD        Physical Exam: BP 102/64   Pulse 109   Temp 100.3 F (37.9 C) (Rectal)   Resp 25   SpO2 97%  General appearance: Frail elderly adult female, lethargic, rouses to voice, makes eye contact, no verbalizations in response, does not consistently follow commands. Eyes: Anicteric, conjunctiva pink, lids and lashes normal. PERRL.    ENT: No nasal deformity, discharge, epistaxis.  Unable to assess hearing. OP dry without lesions.   Neck: No neck masses.  Trachea midline.  No thyromegaly/tenderness. Lymph: No cervical or supraclavicular lymphadenopathy. Skin: Warm and dry.  No jaundice.  No suspicious rashes or lesions. Cardiac: Tachcyardic, regular, nl S1-S2, no murmurs appreciated.  Capillary refill slow, no mottling.  JVP elevated.   Respiratory: Fast labored breathing.  Rales all over.  No wheezes appreciated on my exam, but were noted earlier.  Weak  cough. Abdomen: Abdomen soft.  No TTP. No ascites, distension, hepatosplenomegaly.   MSK: No deformities or effusions.  No cyanosis or clubbing. Neuro: Pupils reactive.  Rouses to voice.   Otherwise does not follow commands. Psych: Unable to assess.     Labs on Admission:  I have personally reviewed following labs and imaging studies: CBC:  Recent Labs Lab 03/01/16 0456 03/02/16 0444 03/07/16 2304  WBC 6.4 11.8* 13.2*  NEUTROABS  --   --  10.5*  HGB 8.2* 8.2* 9.3*  HCT 28.3* 28.6* 31.7*  MCV 91.0 91.4 89.8  PLT 259 293 010*   Basic Metabolic Panel:  Recent Labs Lab 03/01/16 0456 03/02/16 0444 03/07/16 2304  NA 143 142 141  K 4.1 4.0 3.9  CL 103 101 91*  CO2 32 34* 36*  GLUCOSE 86 120* 126*  BUN '12 12 18  '$ CREATININE 0.94 0.96 1.19*  CALCIUM 7.7* 8.2* 10.3  MG 1.7  --   --    GFR: Estimated Creatinine Clearance: 36.7 mL/min (by C-G formula based on SCr of 1.19 mg/dL (H)).  Liver Function Tests:  Recent Labs Lab 03/01/16 0456 03/02/16 0444 03/07/16 2304  AST 52* 112* 25  ALT 89* 102* 26  ALKPHOS 126  233* 114  BILITOT 0.7 0.8 0.4  PROT 5.5* 5.9* 6.8  ALBUMIN 2.8* 2.9* 2.9*   No results for input(s): LIPASE, AMYLASE in the last 168 hours. No results for input(s): AMMONIA in the last 168 hours. Coagulation Profile:  Recent Labs Lab 03/07/16 2304  INR 1.01   Cardiac Enzymes:  Recent Labs Lab 03/07/16 2349  TROPONINI 0.09*   BNP (last 3 results) No results for input(s): PROBNP in the last 8760 hours. HbA1C: No results for input(s): HGBA1C in the last 72 hours. CBG: No results for input(s): GLUCAP in the last 168 hours. Lipid Profile: No results for input(s): CHOL, HDL, LDLCALC, TRIG, CHOLHDL, LDLDIRECT in the last 72 hours. Thyroid Function Tests: No results for input(s): TSH, T4TOTAL, FREET4, T3FREE, THYROIDAB in the last 72 hours. Anemia Panel: No results for input(s): VITAMINB12, FOLATE, FERRITIN, TIBC, IRON, RETICCTPCT in the last 72 hours. Sepsis Labs: Lactic acid 2.3 Invalid input(s): PROCALCITONIN, LACTICIDVEN No results found for this or any previous visit (from the past 240 hour(s)).       Radiological Exams on Admission: Personally reviewed trace bilateral interstitial opacities: Dg Chest Portable 1 View  Result Date: 03/07/2016 CLINICAL DATA:  Hypoxia EXAM: PORTABLE CHEST 1 VIEW COMPARISON:  CXR exams dating back through 09/25/2010 FINDINGS: Pulmonary vascular redistribution consistent with mild CHF. No effusion or pneumothorax. No pulmonary consolidation. Heart is top-normal in size. There is aortic arch atherosclerosis as before. Mild prominence of the hila is stable dating back to 2012 and may reflect pulmonary hypertension. The normally hyperinflated appearance of the lungs is less so on current exam. No acute nor suspicious osseous abnormalities. IMPRESSION: Mild CHF.  Aortic atherosclerosis. Electronically Signed   By: Ashley Royalty M.D.   On: 03/07/2016 23:20    EKG: Independently reviewed. Rate 121, QTc 439, no ST changes.  Echocardiogram 2015:  EF  60-65% Grade I DD Moderate AS      Assessment/Plan  1. Sepsis from HCAP:  Suspected source pneumonia. Organism unknown. Patient meets criteria given tachycardia, tachypnea, fever, leukocytosis, and evidence of organ dysfunction.  Lactate 2.3 mmol/L and repeat ordered within 6 hours.  This patient is at high risk of poor outcomes with a qSOFA score of 3 (at least 2 of the following  clinical criteria: respiratory rate of 22/min or greater, altered mentation, or systolic blood pressure of 100 mm Hg or less).  Antibiotics delivered in the ED.   -Admit to stepdown  -Sepsis bundle utilized:  -Blood and urine cultures drawn  -30 ml/kg bolus given in ED, will repeat lactic acid  -Start targeted antibiotics with vancomycin, cefepime, and azithromycin, based on suspected source of infection    -Repeat renal function and complete blood count in AM  -Code SEPSIS called to E-link  -Sputum culture and gram stain  -Procalcitonin ordered  -S pneumo and legionella antigens       2. COPD with exacerbation:  -Continue Spiriva -Solumedrol IV -Albuterol scheduled and PRN  3. pAF:  CHADS2Vasc 4.  On Xarelto, held until 12/8. -Restart anticoagulation on 12/8  4. Chronic anemia:  Stable.  Anemia of chronic disease.  5. Chronic respiratory failure:  Baseline pCO2 elevated.  On home O2 3L at home.            DVT prophylaxis: On Xarelto starting tomorrow  Code Status: DO NOT RESUSCITATE  Family Communication: None present  Disposition Plan: Anticipate IV antibiotics, steroids, bronchodilators.  Close monitoring of respiratory status.   Consults called: None Admission status: INPATIENT, stepdown         Medical decision making: Patient seen at 1:30 AM on 03/08/2016.  The patient was discussed with Dr. Roxanne Mins.  What exists of the patient's chart was reviewed in depth and summarized above.  Clinical condition: guarded, high possibility that the patient will deteriorate despite broad  spectrum antibiotics and fluids.        Edwin Dada [facility] Pager [phone]

## 2016-03-08 NOTE — Progress Notes (Signed)
PROGRESS NOTE    Paula Contreras  DGU:[id number] DOB: January 13, 1934 DOA: 03/07/2016 PCP: Vena Austria, MD   Brief Narrative:  82 y.o. WF PMHx Dementia, Paroxysmal A-Fib on Xarelto, AS, Bradycardia, Chronic Diastolic CHF, HTN, COPD on 3L at baseline CKD stage III,  Who presents with hypoxia and lethargy for one night.  Caveat that patient is lethargic and unable to provide history, and so all history is collected from nursing home and EMS via EDP.  Patient was recently admitted to hospital for ascending cholangitis, underwent ERCP with sphincterotomy, treated with Unasyn and discharged on Augmentin 5 days ago.  There were no nursing concerns at her SNF until today, staff found her obtunded.  Pulse ox was 43% on 2L and she was lethargic.  EMS noted wheezing, gave solumedrol and nebs.  ED course: -Febrile to 100.24F, heart rate 115, respirations 26+, pulse ox 100% on oxymask, BP initially 119/67, dropped to 89/50 while in the ER -Na 141, K 3.9, Cr 1.19 (baseline 1.0), WBC 13.2K, Hgb 9.3 (baseline 10) -CXR showed bilateral interstitial markings, no definite pneumonia -Lactic acid 2.3 -ECG showed sinus tachycardia -She was given 30 cc/kg fluids and vancomycin and cefepime were administered after cultures were obtained and TRH were asked to evaluate for sepsis from pneumonia   Subjective:  12/7  A/O 2 (does not know where, why), does know she is a hospital. Negative SOB, negative CP, only complaint is she is cold.     Assessment & Plan:   Principal Problem:   Sepsis ([place]) Active Problems:   COPD (chronic obstructive pulmonary disease) (HCC)   Chronic respiratory failure (HCC)   PAF (paroxysmal atrial fibrillation) (HCC)   CKD (chronic kidney disease), stage III   HCAP (healthcare-associated pneumonia)    Sepsis from HCAP:  -Suspected source pneumonia. Organism unknown. Patient meets criteria given tachycardia, tachypnea, fever, leukocytosis, and evidence of organ  dysfunction.   -Trend lactic acid Recent Labs Lab 03/07/16 2311 03/08/16 0135 03/08/16 0246 03/08/16 0522 03/08/16 0738  LATICACIDVEN 2.34* 2.88* 2.1* 2.8* 3.89*  -blood, urine, Legionella pending -Continue current antibiotics              COPD with exacerbation:  -DuoNeb QID  -Solu-Medrol 60 mg daily -Albuterol PRN -Flutter valve -Mucinex DM  Acute on Chronic respiratory failureWith hypoxia:  -Baseline pCO2 elevated.   -On home O2 3L at home. -Titrate O2 to maintain SPO2 89-93%  Paroxysmal Atrial Fib(CHADS2Vasc 4) - On Xarelto,  -Rate controlled  Diastolic dysfunction -Echocardiogram pending -Strict I&O -Daily weight   Chronic anemia:  Recent Labs Lab 03/02/16 0444 03/07/16 2304 03/08/16 0847  HGB 8.2* 9.3* 8.1*  -Dropping may be secondary to hemodilution, however will check occult blood      DVT prophylaxis: Xarelto Code Status: DO NOT RESUSCITATE Family Communication: None Disposition Plan: Resolution sepsis   Consultants:  None  Procedures/Significant Events:  Echocardiogram pending   VENTILATOR SETTINGS: NA   Cultures 12/6 blood 2 pending 12/7 urine pending 12/7 strep pneumo urine antigen negative 12/7 legionella pending   Antimicrobials: Cefepime 12/7>> Vancomycin 12/7>>   Devices NA   LINES / TUBES:  NA    Continuous Infusions: . sodium chloride 100 mL/hr at 03/08/16 0535  . azithromycin Stopped (03/08/16 0328)  . [START ON 03/09/2016] ceFEPime (MAXIPIME) IV    . vancomycin       Objective: Vitals:   03/08/16 0630 03/08/16 0700 03/08/16 0718 03/08/16 0800  BP: 104/68 121/64  106/77  Pulse: 86 81    Resp:  $'19 20  20  'p$ Temp:   97.4 F (36.3 C)   TempSrc:   Oral   SpO2: 99% 96%      Intake/Output Summary (Last 24 hours) at 03/08/16 0818 Last data filed at 03/08/16 9563  Gross per 24 hour  Intake             2500 ml  Output                0 ml  Net             2500 ml   There were no vitals filed for  this visit.  Examination:  General: A/O 2 (does not know where, why), does know she is a hospital., No acute respiratory distress Eyes: negative scleral hemorrhage, negative anisocoria, negative icterus ENT: Negative Runny nose, negative gingival bleeding, Neck:  Negative scars, masses, torticollis, lymphadenopathy, JVD Lungs: Clear to auscultation bilaterally without wheezes or crackles Cardiovascular: Irregular irregular rhythm and rate, without murmur gallop or rub normal S1 and S2 Abdomen: negative abdominal pain, nondistended, positive soft, bowel sounds, no rebound, no ascites, no appreciable mass Extremities: No significant cyanosis, clubbing, or edema bilateral lower extremities Skin: Negative rashes, lesions, ulcers Psychiatric:  Negative depression, negative anxiety, negative fatigue, negative mania  Central nervous system:  Cranial nerves II through XII intact, tongue/uvula midline, all extremities muscle strength 5/5, sensation intact throughout,  negative dysarthria, negative expressive aphasia, negative receptive aphasia.  .     Data Reviewed: Care during the described time interval was provided by me .  I have reviewed this patient's available data, including medical history, events of note, physical examination, and all test results as part of my evaluation. I have personally reviewed and interpreted all radiology studies.  CBC:  Recent Labs Lab 03/02/16 0444 03/07/16 2304  WBC 11.8* 13.2*  NEUTROABS  --  10.5*  HGB 8.2* 9.3*  HCT 28.6* 31.7*  MCV 91.4 89.8  PLT 293 875*   Basic Metabolic Panel:  Recent Labs Lab 03/02/16 0444 03/07/16 2304  NA 142 141  K 4.0 3.9  CL 101 91*  CO2 34* 36*  GLUCOSE 120* 126*  BUN 12 18  CREATININE 0.96 1.19*  CALCIUM 8.2* 10.3   GFR: Estimated Creatinine Clearance: 36.7 mL/min (by C-G formula based on SCr of 1.19 mg/dL (H)). Liver Function Tests:  Recent Labs Lab 03/02/16 0444 03/07/16 2304  AST 112* 25  ALT  102* 26  ALKPHOS 233* 114  BILITOT 0.8 0.4  PROT 5.9* 6.8  ALBUMIN 2.9* 2.9*   No results for input(s): LIPASE, AMYLASE in the last 168 hours. No results for input(s): AMMONIA in the last 168 hours. Coagulation Profile:  Recent Labs Lab 03/07/16 2304  INR 1.01   Cardiac Enzymes:  Recent Labs Lab 03/07/16 2349  TROPONINI 0.09*   BNP (last 3 results) No results for input(s): PROBNP in the last 8760 hours. HbA1C: No results for input(s): HGBA1C in the last 72 hours. CBG: No results for input(s): GLUCAP in the last 168 hours. Lipid Profile: No results for input(s): CHOL, HDL, LDLCALC, TRIG, CHOLHDL, LDLDIRECT in the last 72 hours. Thyroid Function Tests: No results for input(s): TSH, T4TOTAL, FREET4, T3FREE, THYROIDAB in the last 72 hours. Anemia Panel: No results for input(s): VITAMINB12, FOLATE, FERRITIN, TIBC, IRON, RETICCTPCT in the last 72 hours. Urine analysis:    Component Value Date/Time   COLORURINE AMBER (A) 02/24/2016 1650   APPEARANCEUR CLEAR 02/24/2016 1650   LABSPEC 1.017 02/24/2016 1650  PHURINE 7.5 02/24/2016 1650   GLUCOSEU NEGATIVE 02/24/2016 1650   HGBUR NEGATIVE 02/24/2016 1650   BILIRUBINUR SMALL (A) 02/24/2016 1650   KETONESUR NEGATIVE 02/24/2016 1650   PROTEINUR 30 (A) 02/24/2016 1650   UROBILINOGEN 0.2 01/11/2015 2337   NITRITE NEGATIVE 02/24/2016 1650   LEUKOCYTESUR NEGATIVE 02/24/2016 1650   Sepsis Labs: '@LABRCNTIP'$ (procalcitonin:4,lacticidven:4)  )No results found for this or any previous visit (from the past 240 hour(s)).       Radiology Studies: Dg Chest Portable 1 View  Result Date: 03/07/2016 CLINICAL DATA:  Hypoxia EXAM: PORTABLE CHEST 1 VIEW COMPARISON:  CXR exams dating back through 09/25/2010 FINDINGS: Pulmonary vascular redistribution consistent with mild CHF. No effusion or pneumothorax. No pulmonary consolidation. Heart is top-normal in size. There is aortic arch atherosclerosis as before. Mild prominence of the hila is  stable dating back to 2012 and may reflect pulmonary hypertension. The normally hyperinflated appearance of the lungs is less so on current exam. No acute nor suspicious osseous abnormalities. IMPRESSION: Mild CHF.  Aortic atherosclerosis. Electronically Signed   By: Ashley Royalty M.D.   On: 03/07/2016 23:20        Scheduled Meds: . albuterol  2.5 mg Nebulization Q6H  . feeding supplement  1 Container Oral BID BM  . methylPREDNISolone (SOLU-MEDROL) injection  60 mg Intravenous Daily  . pantoprazole  40 mg Oral Daily  . [START ON 03/09/2016] rivaroxaban  20 mg Oral Daily  . sodium chloride flush  3 mL Intravenous Q12H  . Tiotropium Bromide Monohydrate  2 puff Inhalation Daily   Continuous Infusions: . sodium chloride 100 mL/hr at 03/08/16 0535  . azithromycin Stopped (03/08/16 0328)  . [START ON 03/09/2016] ceFEPime (MAXIPIME) IV    . vancomycin       LOS: 0 days    Time spent: 40 minutes    WOODS, Geraldo Docker, MD [facility] Pager [phone]   If 7PM-7AM, please contact night-coverage [email or web address] Password [email or web address] 03/08/2016, 8:18 AM

## 2016-03-09 ENCOUNTER — Inpatient Hospital Stay (HOSPITAL_COMMUNITY): Payer: Medicare Other

## 2016-03-09 ENCOUNTER — Encounter (HOSPITAL_COMMUNITY): Payer: Self-pay | Admitting: General Practice

## 2016-03-09 DIAGNOSIS — I517 Cardiomegaly: Secondary | ICD-10-CM

## 2016-03-09 DIAGNOSIS — A419 Sepsis, unspecified organism: Principal | ICD-10-CM

## 2016-03-09 LAB — BASIC METABOLIC PANEL
Anion gap: 7 (ref 5–15)
BUN: 14 mg/dL (ref 6–20)
CHLORIDE: 105 mmol/L (ref 101–111)
CO2: 31 mmol/L (ref 22–32)
CREATININE: 0.94 mg/dL (ref 0.44–1.00)
Calcium: 8 mg/dL — ABNORMAL LOW (ref 8.9–10.3)
GFR calc Af Amer: >60 mL/min (ref >60)
GFR calc non Af Amer: 56 mL/min — ABNORMAL LOW (ref >60)
GLUCOSE: 148 mg/dL — AB (ref 65–99)
POTASSIUM: 4.5 mmol/L (ref 3.5–5.1)
SODIUM: 143 mmol/L (ref 135–145)

## 2016-03-09 LAB — CBC WITH DIFFERENTIAL/PLATELET
BASOS ABS: 0 10*3/uL (ref 0.0–0.1)
Basophils Relative: 0 %
Eosinophils Absolute: 0 10*3/uL (ref 0.0–0.7)
Eosinophils Relative: 0 %
HEMATOCRIT: 22.6 % — AB (ref 36.0–46.0)
HEMOGLOBIN: 6.7 g/dL — AB (ref 12.0–15.0)
LYMPHS ABS: 0.7 10*3/uL (ref 0.7–4.0)
LYMPHS PCT: 4 %
MCH: 26.3 pg (ref 26.0–34.0)
MCHC: 29.6 g/dL — ABNORMAL LOW (ref 30.0–36.0)
MCV: 88.6 fL (ref 78.0–100.0)
Monocytes Absolute: 0.5 10*3/uL (ref 0.1–1.0)
Monocytes Relative: 3 %
Neutro Abs: 14.2 10*3/uL — ABNORMAL HIGH (ref 1.7–7.7)
Neutrophils Relative %: 93 %
Platelets: 318 10*3/uL (ref 150–400)
RBC: 2.55 MIL/uL — AB (ref 3.87–5.11)
RDW: 15.6 % — ABNORMAL HIGH (ref 11.5–15.5)
WBC: 15.4 10*3/uL — ABNORMAL HIGH (ref 4.0–10.5)

## 2016-03-09 LAB — URINE CULTURE: CULTURE: NO GROWTH

## 2016-03-09 LAB — LEGIONELLA PNEUMOPHILA SEROGP 1 UR AG: L. PNEUMOPHILA SEROGP 1 UR AG: NEGATIVE

## 2016-03-09 LAB — ECHOCARDIOGRAM COMPLETE: Weight: 2645.52 oz

## 2016-03-09 LAB — LACTIC ACID, PLASMA: Lactic Acid, Venous: 1.2 mmol/L (ref 0.5–1.9)

## 2016-03-09 LAB — MAGNESIUM: Magnesium: 1.7 mg/dL (ref 1.7–2.4)

## 2016-03-09 LAB — PREPARE RBC (CROSSMATCH)

## 2016-03-09 MED ORDER — IPRATROPIUM-ALBUTEROL 0.5-2.5 (3) MG/3ML IN SOLN
3.0000 mL | Freq: Two times a day (BID) | RESPIRATORY_TRACT | Status: DC
  Administered 2016-03-09 – 2016-03-13 (×8): 3 mL via RESPIRATORY_TRACT
  Filled 2016-03-09 (×8): qty 3

## 2016-03-09 MED ORDER — METHYLPREDNISOLONE SODIUM SUCC 40 MG IJ SOLR
40.0000 mg | Freq: Every day | INTRAMUSCULAR | Status: DC
  Administered 2016-03-09 – 2016-03-10 (×2): 40 mg via INTRAVENOUS
  Filled 2016-03-09 (×2): qty 1

## 2016-03-09 MED ORDER — ORAL CARE MOUTH RINSE
15.0000 mL | Freq: Two times a day (BID) | OROMUCOSAL | Status: DC
  Administered 2016-03-10 – 2016-03-11 (×3): 15 mL via OROMUCOSAL

## 2016-03-09 MED ORDER — SODIUM CHLORIDE 0.9 % IV SOLN: Freq: Once | INTRAVENOUS | Status: AC

## 2016-03-09 NOTE — Care Management Note (Signed)
Case Management Note Marvetta Gibbons RN, BSN Unit 2W-Case Manager [phone]  Patient Details  Name: Idalie Canto MRN: [id number] Date of Birth: May 08, 1933  Subjective/Objective:   Pt admitted with sepsis                 Action/Plan: PTA pt was at SNF- [facility]- CSW to follow for return to SNF when medically stable.   Expected Discharge Date:                  Expected Discharge Plan:  Skilled Nursing Facility  In-House Referral:  Clinical Social Work  Discharge planning Services  CM Consult  Post Acute Care Choice:    Choice offered to:     DME Arranged:    DME Agency:     HH Arranged:    [place] Agency:     Status of Service:  In process, will continue to follow  If discussed at Long Length of Stay Meetings, dates discussed:    Additional Comments:  Dawayne Patricia, RN 03/09/2016, 12:16 PM

## 2016-03-09 NOTE — Progress Notes (Signed)
PROGRESS NOTE    Paula Contreras  OIB:[id number] DOB: 06-29-1933 DOA: 03/07/2016   PCP: Henrine Screws, MD   Brief Narrative:  82 y.o. WF PMHx Dementia, Paroxysmal A-Fib on Xarelto, AS, Bradycardia, Chronic Diastolic CHF, HTN, COPD on 3L at baseline CKD stage III, recently admitted to hospital for ascending cholangitis, underwent ERCP with sphincterotomy, treated with Unasyn and discharged on Augmentin 5 days PTA, now presented with hypoxia and lethargy. Pt is a resident of SNF.  ED course: - Febrile to 100.60F, heart rate 115, respirations 26+, pulse ox 100% on oxymask, BP initially 119/67, dropped to 89/50 while in the ER - Na 141, K 3.9, Cr 1.19 (baseline 1.0), WBC 13.2K - CXR showed bilateral interstitial markings, no definite pneumonia - Lactic acid 2.3 - ECG showed sinus tachycardia - given 30 cc/kg fluids, vancomycin and cefepime administered after cultures were obtained, TRH were asked to evaluate for sepsis from pneumonia  Subjective:  Reports feeling better, wants to eat this AM.    Assessment & Plan:   Sepsis from HCAP, bilateral lobes  - Suspected source pneumonia. Organism unknown - Patient met sepsis criteria given tachycardia, tachypnea, fever, leukocytosis, and evidence of organ dysfunction.   - lactic acid cleared and 1.2 this AM which is WNL  - blood, urine, Legionella pending - Continue current antibiotics              COPD with exacerbation - minimal exp wheezing on exam this AM - continue to provide BD's scheduled and as needed  - taper down steroids from 60 mg IV QD to 40 mg QD - Flutter valve - Mucinex DM  Acute on Chronic respiratory failure with hypoxia - secondary to the above - Baseline pCO2 elevated.   - On home O2 3L at home. - Titrate O2 to maintain SPO2 89-93%  Paroxysmal Atrial Fib(CHADS2Vasc 4) - On Xarelto - rate controlled   Troponin elevation - suspect from demand ischemia from sepsis  - no chest pain, no need to trend enzymes  at this time  Acute blood loss anemia, ? IDA - hg drop overnight from 8.1 --> 6.7, will transfuse two U PRBC  - CBC in AM  Diastolic dysfunction - ECHO done, reading pending  - monitor daily weights - weight since admission: 160 --> 165 lbs   DVT prophylaxis: Xarelto Code Status: DO NOT RESUSCITATE Family Communication: None Disposition Plan: SNF when sepsis resolved and blood counts Hg stable   Consultants:   None  Procedures/Significant Events:   Echocardiogram pending  Cultures:  12/6 blood  2 pending  12/7 urine pending  12/7 strep pneumo urine antigen negative  12/7 legionella pending  Antimicrobials:  Cefepime 12/7 -->  Vancomycin 12/7 -->  Zithromax 12/07 -->  Objective: Vitals:   03/08/16 2202 03/09/16 0210 03/09/16 0456 03/09/16 0721  BP: (!) 102/50  (!) 106/52   Pulse: 74  66   Resp: 18  12   Temp: 98 F (36.7 C)  98.2 F (36.8 C)   TempSrc: Oral  Axillary   SpO2: 96% 97% 94% 95%  Weight:   75 kg (165 lb 5.5 oz)     Intake/Output Summary (Last 24 hours) at 03/09/16 0815 Last data filed at 03/09/16 0300  Gross per 24 hour  Intake          4500.67 ml  Output                0 ml  Net  4500.67 ml   Filed Weights   03/08/16 1247 03/09/16 0456  Weight: 72.9 kg (160 lb 11.5 oz) 75 kg (165 lb 5.5 oz)    Examination:  General: A/O 2 (does not know where, why), does know she is a hospital., No acute respiratory distress Eyes: negative scleral hemorrhage, negative anisocoria, negative icterus ENT: Negative Runny nose, negative gingival bleeding, Neck:  Negative scars, masses, torticollis, lymphadenopathy, JVD Lungs: minimal exp wheezing mostly bibasilar  Cardiovascular: Irregular irregular rhythm and rate, without murmur gallop or rub normal S1 and S2 Abdomen: negative abdominal pain, nondistended, positive soft, bowel sounds, no rebound, no ascites, no appreciable mass Extremities: No significant cyanosis, clubbing, or edema  bilateral lower extremities Skin: Negative rashes, lesions, ulcers Psychiatric:  Negative depression, negative anxiety, negative fatigue, negative mania  Central nervous system:  Cranial nerves II through XII intact, tongue/uvula midline, all extremities muscle strength 5/5, sensation intact throughout,  negative dysarthria, negative expressive aphasia, negative receptive aphasia.  Data Reviewed: Care during the described time interval was provided by me .  I have reviewed this patient's available data, including medical history, events of note, physical examination, and all test results as part of my evaluation.  CBC:  Recent Labs Lab 03/07/16 2304 03/08/16 0847 03/09/16 0224  WBC 13.2* 13.5* 15.4*  NEUTROABS 10.5* 12.7* 14.2*  HGB 9.3* 8.1* 6.7*  HCT 31.7* 27.1* 22.6*  MCV 89.8 88.3 88.6  PLT 444* 353 048   Basic Metabolic Panel:  Recent Labs Lab 03/07/16 2304 03/08/16 0847 03/09/16 0224  NA 141 141 143  K 3.9 3.9 4.5  CL 91* 99* 105  CO2 36* 30 31  GLUCOSE 126* 165* 148*  BUN 18 17 14   CREATININE 1.19* 1.08* 0.94  CALCIUM 10.3 8.8* 8.0*  MG  --  1.7 1.7   Liver Function Tests:  Recent Labs Lab 03/07/16 2304  AST 25  ALT 26  ALKPHOS 114  BILITOT 0.4  PROT 6.8  ALBUMIN 2.9*   Coagulation Profile:  Recent Labs Lab 03/07/16 2304  INR 1.01   Cardiac Enzymes:  Recent Labs Lab 03/07/16 2349  TROPONINI 0.09*   Urine analysis:    Component Value Date/Time   COLORURINE AMBER (A) 02/24/2016 1650   APPEARANCEUR CLEAR 02/24/2016 1650   LABSPEC 1.017 02/24/2016 1650   PHURINE 7.5 02/24/2016 1650   GLUCOSEU NEGATIVE 02/24/2016 1650   HGBUR NEGATIVE 02/24/2016 1650   BILIRUBINUR SMALL (A) 02/24/2016 1650   KETONESUR NEGATIVE 02/24/2016 1650   PROTEINUR 30 (A) 02/24/2016 1650   UROBILINOGEN 0.2 01/11/2015 2337   NITRITE NEGATIVE 02/24/2016 1650   LEUKOCYTESUR NEGATIVE 02/24/2016 1650    Radiology Studies: Dg Chest Portable 1 View  Result Date:  03/07/2016 CLINICAL DATA:  Hypoxia EXAM: PORTABLE CHEST 1 VIEW COMPARISON:  CXR exams dating back through 09/25/2010 FINDINGS: Pulmonary vascular redistribution consistent with mild CHF. No effusion or pneumothorax. No pulmonary consolidation. Heart is top-normal in size. There is aortic arch atherosclerosis as before. Mild prominence of the hila is stable dating back to 2012 and may reflect pulmonary hypertension. The normally hyperinflated appearance of the lungs is less so on current exam. No acute nor suspicious osseous abnormalities. IMPRESSION: Mild CHF.  Aortic atherosclerosis. Electronically Signed   By: Ashley Royalty M.D.   On: 03/07/2016 23:20    Scheduled Meds: . azithromycin  500 mg Intravenous Q24H  . ceFEPime (MAXIPIME) IV  2 g Intravenous Q24H  . feeding supplement  1 Container Oral BID BM  . ipratropium-albuterol  3  mL Nebulization Q6H  . methylPREDNISolone (SOLU-MEDROL) injection  60 mg Intravenous Daily  . pantoprazole  40 mg Oral Daily  . rivaroxaban  20 mg Oral Daily  . sodium chloride flush  3 mL Intravenous Q12H  . vancomycin  1,000 mg Intravenous Q24H   Continuous Infusions: . sodium chloride 100 mL/hr at 03/09/16 0807     LOS: 1 day   Time spent: 25 minutes  Faye Ramsay, MD [facility] Pager [phone]  If 7PM-7AM, please contact night-coverage [email or web address] Password [email or web address] 03/09/2016, 8:15 AM

## 2016-03-09 NOTE — Evaluation (Signed)
Physical Therapy Evaluation Patient Details Name: Paula Contreras MRN: [id number] DOB: [birth year] Today's Date: 03/09/2016   History of Present Illness  82 y.o. female PMHx Dementia, Paroxysmal A-Fib on Xarelto, AS, Bradycardia, Chronic Diastolic CHF, HTN, COPD on 3L at baseline CKD stage III. Now presented with hypoxia and lethargy.  Clinical Impression  Patient demonstrates deficits in functional mobility as indicated below. Will need continued skilled PT to address deficits and maximize function. Will see as indicated and progress as tolerated. Recommend return to SNF upon acute discharge.     Follow Up Recommendations SNF;Supervision for mobility/OOB    Equipment Recommendations  None recommended by PT    Recommendations for Other Services       Precautions / Restrictions Precautions Precautions: Fall (Simultaneous filing. User may not have seen previous data.) Precaution Comments: chronic O2 Restrictions Weight Bearing Restrictions: No (Simultaneous filing. User may not have seen previous data.)      Mobility  Bed Mobility Overal bed mobility: Needs Assistance Bed Mobility: Supine to Sit;Sit to Supine     Supine to sit: Min assist;HOB elevated Sit to supine: Min assist   General bed mobility comments: Pt up on arrival.  Transfers Overall transfer level: Needs assistance Equipment used: Rolling walker (2 wheeled) Transfers: Sit to/from Stand Sit to Stand: Mod assist;+2 physical assistance         General transfer comment: Increased physical assist to elevate to standing with use of gait belt and support. Patient with increased fatigue in static standing, had to sit immediately  Ambulation/Gait             General Gait Details: could not tolerate ambulation today. Stated SOB, SpO2 on 2 liters 78%, increased to 3 liters then 4 liters.  Stairs            Wheelchair Mobility    Modified Rankin (Stroke Patients Only)       Balance Overall balance  assessment: Needs assistance Sitting-balance support: Feet supported Sitting balance-Leahy Scale: Fair     Standing balance support: Bilateral upper extremity supported;During functional activity Standing balance-Leahy Scale: Poor Standing balance comment: requires UE support                             Pertinent Vitals/Pain Pain Assessment: No/denies pain    Home Living Family/patient expects to be discharged to:: Skilled nursing facility (Simultaneous filing. User may not have seen previous data.)               Home Equipment: Walker - 2 wheels;Shower seat Additional Comments: From [facility], plan to return. (Simultaneous filing. User may not have seen previous data.)    Prior Function Level of Independence: Needs assistance   Gait / Transfers Assistance Needed: pt states she is ambulatory with RW, typically only around room, meals brought to room  ADL's / Homemaking Assistance Needed: Pt has assist with all meals(delivered to room) and housekeeping.  Pt also gets assist with bathing/dressing.  Pt can toilet with mod I and grooms/eats without assist.        Hand Dominance   Dominant Hand: Right    Extremity/Trunk Assessment   Upper Extremity Assessment: Generalized weakness           Lower Extremity Assessment: Generalized weakness      Cervical / Trunk Assessment: Kyphotic  Communication   Communication: HOH  Cognition Arousal/Alertness: Awake/alert Behavior During Therapy: WFL for tasks assessed/performed Overall Cognitive Status: History of cognitive  impairments - at baseline                      General Comments General comments (skin integrity, edema, etc.): hygiene and pericare performed on patient. Cues for energy conservation and pursed lib breathing    Exercises     Assessment/Plan    PT Assessment Patient needs continued PT services  PT Problem List Decreased strength;Decreased activity tolerance;Decreased  mobility          PT Treatment Interventions DME instruction;Gait training;Functional mobility training;Therapeutic exercise;Therapeutic activities;Patient/family education    PT Goals (Current goals can be found in the Care Plan section)  Acute Rehab PT Goals Patient Stated Goal: to get back home PT Goal Formulation: With patient Time For Goal Achievement: 03/08/16 Potential to Achieve Goals: Good    Frequency Min 2X/week   Barriers to discharge        Co-evaluation PT/OT/SLP Co-Evaluation/Treatment: Yes Reason for Co-Treatment: To address functional/ADL transfers;For patient/therapist safety PT goals addressed during session: Mobility/safety with mobility OT goals addressed during session: ADL's and self-care       End of Session Equipment Utilized During Treatment: Gait belt;Oxygen Activity Tolerance: Patient limited by fatigue Patient left: in bed;with call bell/phone within reach;with bed alarm set Nurse Communication: Mobility status;Other (comment) (decreased o2 satuations)         Time: 4446-1901 PT Time Calculation (min) (ACUTE ONLY): 27 min   Charges:   PT Evaluation $PT Eval Moderate Complexity: 1 Procedure     PT G Codes:        Duncan Dull 04/05/16, 3:48 PM Alben Deeds, [place] DPT  [phone]

## 2016-03-09 NOTE — Progress Notes (Signed)
Received verbal consent from pt's sister in law, [name], for pt to receive blood products. Consent signed by RN Beckey Rutter and myself. Consent placed on chart.   Fritz Pickerel, RN

## 2016-03-09 NOTE — Progress Notes (Signed)
  Echocardiogram 2D Echocardiogram has been performed.  Zera Markwardt L Androw 03/09/2016, 5:01 PM

## 2016-03-09 NOTE — Progress Notes (Addendum)
CRITICAL VALUE ALERT  Critical value received:  Hgb 6.7  Date of notification:  03/09/16  Time of notification:  0256  Critical value read back: Yes  Nurse who received alert:  Bea Graff, RN  MD notified (1st page):  Baltazar Najjar  Time of first page:  0256  MD notified (2nd page):  Time of second page:  Responding MD: Baltazar Najjar  Time MD responded: [id number]

## 2016-03-09 NOTE — Progress Notes (Signed)
Pt RAC IV began leaking after 1hr 6mn of blood transfusion. Blood tubing disconnected from IV, sterile procedure, red cap placed. Re-dressed IV, continued to leak, removed. L hand IV occluded, removed.   IV team consult placed STAT so as to finish blood administration.  Spoke with blood bank. Provided blood administration completed within 4 hours of 1545 okay to change IV site.  CFritz Pickerel RN

## 2016-03-09 NOTE — Evaluation (Signed)
Occupational Therapy Evaluation Patient Details Name: Paula Contreras MRN: [id number] DOB: December 21, 1933 Today's Date: 03/09/2016    History of Present Illness 82 y.o. female PMHx Dementia, Paroxysmal A-Fib on Xarelto, AS, Bradycardia, Chronic Diastolic CHF, HTN, COPD on 3L at baseline CKD stage III. Now presented with hypoxia and lethargy.   Clinical Impression   Pt required assist for ADL PTA. Pt required mod assist +2 for sit to stand from EOB x1 this session. Requires min guard for seated ADL and max assist for LB ADL. Recommending SNF for follow up to maximize independence and safety with ADL and functional mobility. Pt would benefit from continued skilled OT to address established goals.    Follow Up Recommendations  SNF;Supervision/Assistance - 24 hour    Equipment Recommendations  Other (comment) (TBD at next venue)    Recommendations for Other Services       Precautions / Restrictions Precautions Precautions: Fall Precaution Comments: chronic O2 Restrictions Weight Bearing Restrictions: No      Mobility Bed Mobility Overal bed mobility: Needs Assistance Bed Mobility: Supine to Sit;Sit to Supine     Supine to sit: Min assist;HOB elevated Sit to supine: Min assist   General bed mobility comments: Pt up on arrival.  Transfers Overall transfer level: Needs assistance Equipment used: Rolling walker (2 wheeled) Transfers: Sit to/from Stand Sit to Stand: Mod assist;+2 physical assistance         General transfer comment: Increased physical assist to elevate to standing with use of gait belt and support. Patient with increased fatigue in static standing, had to sit immediately    Balance Overall balance assessment: Needs assistance Sitting-balance support: Feet supported Sitting balance-Leahy Scale: Fair     Standing balance support: Bilateral upper extremity supported;During functional activity Standing balance-Leahy Scale: Poor Standing balance comment:  requires UE support                            ADL Overall ADL's : Needs assistance/impaired     Grooming: Min guard;Sitting   Upper Body Bathing: Min guard;Sitting   Lower Body Bathing: Maximal assistance;Sit to/from stand   Upper Body Dressing : Min guard;Sitting   Lower Body Dressing: Maximal assistance       Toileting- Clothing Manipulation and Hygiene: Moderate assistance Toileting - Clothing Manipulation Details (indicate cue type and reason): Pt self cleaned sitting EOB but required additional assist to get clean at bed level     Functional mobility during ADLs: Moderate assistance;+2 for physical assistance (for sit to stand only) General ADL Comments: Pt with SOB and reports feeling like she cannot breath when sitting EOB following sit to stand. SpO2=79% on 2L.     Vision     Perception     Praxis      Pertinent Vitals/Pain Pain Assessment: No/denies pain     Hand Dominance Right   Extremity/Trunk Assessment Upper Extremity Assessment Upper Extremity Assessment: Generalized weakness   Lower Extremity Assessment Lower Extremity Assessment: Generalized weakness   Cervical / Trunk Assessment Cervical / Trunk Assessment: Kyphotic   Communication Communication Communication: HOH   Cognition Arousal/Alertness: Awake/alert Behavior During Therapy: WFL for tasks assessed/performed Overall Cognitive Status: History of cognitive impairments - at baseline                     General Comments       Exercises       Shoulder Instructions      Home  Living Family/patient expects to be discharged to:: [place]: Gilford Rile - 2 wheels;Shower seat   Additional Comments: From [facility], plan to return.      Prior Functioning/Environment Level of Independence: Needs assistance  Gait / Transfers Assistance Needed: pt states she is ambulatory with RW, typically only  around room, meals brought to room ADL's / Homemaking Assistance Needed: Pt has assist with all meals(delivered to room) and housekeeping.  Pt also gets assist with bathing/dressing.  Pt can toilet with mod I and grooms/eats without assist.            OT Problem List: Decreased strength;Decreased activity tolerance;Impaired balance (sitting and/or standing);Decreased knowledge of use of DME or AE;Cardiopulmonary status limiting activity   OT Treatment/Interventions: Self-care/ADL training;Energy conservation;DME and/or AE instruction;Therapeutic activities;Patient/family education;Balance training    OT Goals(Current goals can be found in the care plan section) Acute Rehab OT Goals Patient Stated Goal: to get back home OT Goal Formulation: With patient Time For Goal Achievement: 03/23/16 Potential to Achieve Goals: Good ADL Goals Pt Will Perform Grooming: with supervision;standing Pt Will Perform Upper Body Bathing: with supervision;sitting Pt Will Perform Lower Body Bathing: with supervision;sit to/from stand Pt Will Transfer to Toilet: with supervision;ambulating;bedside commode Pt Will Perform Toileting - Clothing Manipulation and hygiene: with supervision;sit to/from stand  OT Frequency: Min 2X/week   Barriers to D/C:            Co-evaluation PT/OT/SLP Co-Evaluation/Treatment: Yes Reason for Co-Treatment: To address functional/ADL transfers PT goals addressed during session: Mobility/safety with mobility OT goals addressed during session: ADL's and self-care      End of Session Equipment Utilized During Treatment: Rolling walker;Oxygen Nurse Communication: Mobility status;Other (comment) (SpO2)  Activity Tolerance: Patient tolerated treatment well Patient left: in bed;with bed alarm set;with call bell/phone within reach   Time: 1429-1456 OT Time Calculation (min): 27 min Charges:  OT General Charges $OT Visit: 1 Procedure OT Evaluation $OT Eval Moderate  Complexity: 1 Procedure G-Codes:     Binnie Kand M.S., OTR/L Pager: [phone]  03/09/2016, 4:00 PM

## 2016-03-10 ENCOUNTER — Inpatient Hospital Stay (HOSPITAL_COMMUNITY): Payer: Medicare Other

## 2016-03-10 DIAGNOSIS — L8990 Pressure ulcer of unspecified site, unspecified stage: Secondary | ICD-10-CM | POA: Insufficient documentation

## 2016-03-10 LAB — TYPE AND SCREEN
ABO/RH(D): A POS
Antibody Screen: NEGATIVE
UNIT DIVISION: 0
UNIT DIVISION: 0

## 2016-03-10 LAB — CBC WITH DIFFERENTIAL/PLATELET
BASOS PCT: 0 %
Basophils Absolute: 0 10*3/uL (ref 0.0–0.1)
EOS ABS: 0 10*3/uL (ref 0.0–0.7)
EOS PCT: 0 %
HEMATOCRIT: 32.6 % — AB (ref 36.0–46.0)
HEMOGLOBIN: 9.8 g/dL — AB (ref 12.0–15.0)
LYMPHS PCT: 6 %
Lymphs Abs: 0.8 10*3/uL (ref 0.7–4.0)
MCH: 26.1 pg (ref 26.0–34.0)
MCHC: 30.1 g/dL (ref 30.0–36.0)
MCV: 86.7 fL (ref 78.0–100.0)
MONOS PCT: 7 %
Monocytes Absolute: 1 10*3/uL (ref 0.1–1.0)
NEUTROS ABS: 12.3 10*3/uL — AB (ref 1.7–7.7)
NEUTROS PCT: 87 %
Platelets: 371 10*3/uL (ref 150–400)
RBC: 3.76 MIL/uL — ABNORMAL LOW (ref 3.87–5.11)
RDW: 15.6 % — ABNORMAL HIGH (ref 11.5–15.5)
WBC: 14.1 10*3/uL — ABNORMAL HIGH (ref 4.0–10.5)

## 2016-03-10 LAB — BASIC METABOLIC PANEL
Anion gap: 6 (ref 5–15)
BUN: 18 mg/dL (ref 6–20)
CALCIUM: 8.3 mg/dL — AB (ref 8.9–10.3)
CO2: 31 mmol/L (ref 22–32)
CREATININE: 0.97 mg/dL (ref 0.44–1.00)
Chloride: 103 mmol/L (ref 101–111)
GFR, EST NON AFRICAN AMERICAN: 54 mL/min — AB (ref >60)
Glucose, Bld: 123 mg/dL — ABNORMAL HIGH (ref 65–99)
Potassium: 4.1 mmol/L (ref 3.5–5.1)
SODIUM: 140 mmol/L (ref 135–145)

## 2016-03-10 LAB — PROCALCITONIN: PROCALCITONIN: 0.2 ng/mL

## 2016-03-10 LAB — MAGNESIUM: MAGNESIUM: 1.9 mg/dL (ref 1.7–2.4)

## 2016-03-10 MED ORDER — PREDNISONE 20 MG PO TABS
50.0000 mg | ORAL_TABLET | Freq: Every day | ORAL | Status: DC
  Administered 2016-03-11 – 2016-03-12 (×2): 50 mg via ORAL
  Filled 2016-03-10 (×2): qty 2

## 2016-03-10 MED ORDER — RIVAROXABAN 20 MG PO TABS
20.0000 mg | ORAL_TABLET | Freq: Every day | ORAL | Status: DC
  Administered 2016-03-10 – 2016-03-12 (×3): 20 mg via ORAL
  Filled 2016-03-10 (×3): qty 1

## 2016-03-10 MED ORDER — LEVOFLOXACIN 750 MG PO TABS
750.0000 mg | ORAL_TABLET | ORAL | Status: DC
  Administered 2016-03-10 – 2016-03-12 (×2): 750 mg via ORAL
  Filled 2016-03-10 (×2): qty 1

## 2016-03-10 NOTE — Progress Notes (Signed)
PROGRESS NOTE    Paula Contreras  DVV:[id number] DOB: Aug 27, 1933 DOA: 03/07/2016   PCP: Henrine Screws, MD   Brief Narrative:  82 y.o. WF PMHx Dementia, Paroxysmal A-Fib on Xarelto, AS, Bradycardia, Chronic Diastolic CHF, HTN, COPD on 3L at baseline CKD stage III, recently admitted to hospital for ascending cholangitis, underwent ERCP with sphincterotomy, treated with Unasyn and discharged on Augmentin 5 days PTA, now presented with hypoxia and lethargy. Pt is a resident of SNF.  ED course: - Febrile to 100.28F, heart rate 115, respirations 26+, pulse ox 100% on oxymask, BP initially 119/67, dropped to 89/50 while in the ER - Na 141, K 3.9, Cr 1.19 (baseline 1.0), WBC 13.2K - CXR showed bilateral interstitial markings, no definite pneumonia - Lactic acid 2.3 - ECG showed sinus tachycardia - given 30 cc/kg fluids, vancomycin and cefepime administered after cultures were obtained, TRH were asked to evaluate for sepsis from pneumonia  Subjective:  Reports feeling better, denies chest pain or dyspnea.   Assessment & Plan:   Sepsis from HCAP, bilateral lobes  - Suspected source pneumonia. Organism unknown - Patient met sepsis criteria given tachycardia, tachypnea, fever, leukocytosis, and evidence of organ dysfunction.   - lactic acid cleared and 1.2 this AM which is WNL  - blood, urine, Legionella negative  - currently on Vancomycin and Maxipime, will change ABX to oral Levaquin as pt tolerating PO well  - repeat CXR to ensure improvement               COPD with exacerbation - minimal exp wheezing on exam this AM - continue to provide BD's scheduled and as needed  - continue to taper down steroids  - Flutter valve - Mucinex DM  Acute on Chronic respiratory failure with hypoxia due to COPD - secondary to the above - Baseline pCO2 elevated.   - On home O2 3L at home. - Titrate O2 to maintain SPO2 89-93%  Paroxysmal Atrial Fib(CHADS2Vasc 4) - On Xarelto - rate controlled    Acute kidney injury  - secondary to sepsis - resolved with IVF, stop IVF   Troponin elevation - suspect from demand ischemia from sepsis  - no chest pain, no need to trend enzymes at this time  Acute blood loss anemia, ? IDA - hg drop overnight 12/07 from 8.1 --> 6.7, transfused two U PRBC on 12/08 and Hg appropriately up to 9.8 - FOBT pending  - CBC in AM  Diastolic dysfunction, chronic  - ECHO done, reading pending  - monitor daily weights - weight since admission: 160 --> 165 lbs   DVT prophylaxis: Xarelto Code Status: DO NOT RESUSCITATE Family Communication: None Disposition Plan: SNF in 1-2 days if CXR looks stable and if Hg stable   Consultants:   None  Procedures/Significant Events:   Echocardiogram pending  Cultures:  12/6 blood  2 pending  12/7 urine pending  12/7 strep pneumo urine antigen negative  12/7 legionella pending  Antimicrobials:  Cefepime 12/07 --> 12/09  Vancomycin 12/07 --> 12/09  Zithromax 12/07 --> 12/09  Levaquin 12/09  Objective: Vitals:   03/09/16 1940 03/09/16 2118 03/10/16 0459 03/10/16 0844  BP: 140/67 139/62 (!) 149/71   Pulse: 76 73 65 73  Resp: _0 Temp: 98.5 F (36.9 C) 98 F (36.7 C) 98.3 F (36.8 C)   TempSrc: Oral Oral Oral   SpO2: 94% 96% 95% 94%  Weight:   74.8 kg (164 lb 14.5 oz)   Height:  Intake/Output Summary (Last 24 hours) at 03/10/16 1052 Last data filed at 03/10/16 0800  Gross per 24 hour  Intake             1500 ml  Output                0 ml  Net             1500 ml   Filed Weights   03/08/16 1247 03/09/16 0456 03/10/16 0459  Weight: 72.9 kg (160 lb 11.5 oz) 75 kg (165 lb 5.5 oz) 74.8 kg (164 lb 14.5 oz)    Examination:  General: A/O 2, NAD, following commands appropriately  Eyes: negative scleral hemorrhage, negative anisocoria, negative icterus ENT: Negative Runny nose, negative gingival bleeding, Lungs: minimal exp wheezing mostly bibasilar  Cardiovascular:  Irregular irregular rhythm and rate, without murmur gallop or rub normal S1 and S2 Abdomen: negative abdominal pain, nondistended, positive soft, bowel sounds, no rebound, no ascites, no appreciable mass Extremities: No significant cyanosis, clubbing, or edema bilateral lower extremities Skin: Negative rashes, lesions, ulcers Psychiatric:  Negative depression, negative anxiety, negative fatigue, negative mania  Central nervous system:  Cranial nerves II through XII intact, tongue/uvula midline, all extremities muscle strength 5/5, sensation intact throughout,  negative dysarthria, negative expressive aphasia, negative receptive aphasia.  Data Reviewed: Care during the described time interval was provided by me .  I have reviewed this patient's available data, including medical history, events of note, physical examination, and all test results as part of my evaluation.  CBC:  Recent Labs Lab 03/07/16 2304 03/08/16 0847 03/09/16 0224 03/10/16 0649  WBC 13.2* 13.5* 15.4* 14.1*  NEUTROABS 10.5* 12.7* 14.2* 12.3*  HGB 9.3* 8.1* 6.7* 9.8*  HCT 31.7* 27.1* 22.6* 32.6*  MCV 89.8 88.3 88.6 86.7  PLT 444* 353 318 364   Basic Metabolic Panel:  Recent Labs Lab 03/07/16 2304 03/08/16 0847 03/09/16 0224 03/10/16 0649  NA 141 141 143 140  K 3.9 3.9 4.5 4.1  CL 91* 99* 105 103  CO2 36* _0 GLUCOSE 126* 165* 148* 123*  BUN _1 CREATININE 1.19* 1.08* 0.94 0.97  CALCIUM 10.3 8.8* 8.0* 8.3*  MG  --  1.7 1.7 1.9   Liver Function Tests:  Recent Labs Lab 03/07/16 2304  AST 25  ALT 26  ALKPHOS 114  BILITOT 0.4  PROT 6.8  ALBUMIN 2.9*   Coagulation Profile:  Recent Labs Lab 03/07/16 2304  INR 1.01   Cardiac Enzymes:  Recent Labs Lab 03/07/16 2349  TROPONINI 0.09*   Urine analysis:    Component Value Date/Time   COLORURINE AMBER (A) 02/24/2016 1650   APPEARANCEUR CLEAR 02/24/2016 1650   LABSPEC 1.017 02/24/2016 1650   PHURINE 7.5 02/24/2016 1650    GLUCOSEU NEGATIVE 02/24/2016 1650   HGBUR NEGATIVE 02/24/2016 1650   BILIRUBINUR SMALL (A) 02/24/2016 1650   KETONESUR NEGATIVE 02/24/2016 1650   PROTEINUR 30 (A) 02/24/2016 1650   UROBILINOGEN 0.2 01/11/2015 2337   NITRITE NEGATIVE 02/24/2016 1650   LEUKOCYTESUR NEGATIVE 02/24/2016 1650    Radiology Studies: No results found.  Scheduled Meds: . azithromycin  500 mg Intravenous Q24H  . ceFEPime (MAXIPIME) IV  2 g Intravenous Q24H  . feeding supplement  1 Container Oral BID BM  . ipratropium-albuterol  3 mL Nebulization BID  . mouth rinse  15 mL Mouth Rinse BID  . methylPREDNISolone (SOLU-MEDROL) injection  40 mg Intravenous Daily  . pantoprazole  40 mg Oral  Daily  . rivaroxaban  20 mg Oral Q supper  . sodium chloride flush  3 mL Intravenous Q12H  . vancomycin  1,000 mg Intravenous Q24H   Continuous Infusions:    LOS: 2 days   Time spent: 25 minutes  Faye Ramsay, MD [facility] Pager [phone]  If 7PM-7AM, please contact night-coverage [email or web address] Password [email or web address] 03/10/2016, 10:52 AM

## 2016-03-10 NOTE — Progress Notes (Signed)
Pharmacy Antibiotic Note  Paula Contreras is a 82 y.o. female currently on vancomycin, cefepime, and azithromycin for sepsis 2/2 HCAP now to switch to PO levaquin. Renal function wnl with CrCl ~ 49 ml/min.   Plan: 1) Levaquin '750mg'$  PO q48  Height: '5\' 7"'$  (170.2 cm) Weight: 164 lb 14.5 oz (74.8 kg) IBW/kg (Calculated) : 61.6  Temp (24hrs), Avg:98.3 F (36.8 C), Min:98 F (36.7 C), Max:98.5 F (36.9 C)   Recent Labs Lab 03/07/16 2304  03/08/16 0246 03/08/16 0522 03/08/16 0738 03/08/16 0847 03/08/16 1000 03/09/16 0224 03/10/16 0649  WBC 13.2*  --   --   --   --  13.5*  --  15.4* 14.1*  CREATININE 1.19*  --   --   --   --  1.08*  --  0.94 0.97  LATICACIDVEN  --   < > 2.1* 2.8* 3.89*  --  2.5* 1.2  --   < > = values in this interval not displayed.  Estimated Creatinine Clearance: 48.9 mL/min (by C-G formula based on SCr of 0.97 mg/dL).    Allergies  Allergen Reactions  . Codeine Nausea And Vomiting  . Robitussin (Alcohol Free) [Guaifenesin] Other (See Comments)    Per MAR    Antimicrobials this admission: 12/7 vancomycin >> 12/9 12/7 cefepime >> 12/9 12/7 azithromycin >> 12/ 9  Dose adjustments this admission: n/a  Microbiology results: 12/6 blood x2>> ngtd 12/7 urine >> negative final  Thank you for allowing pharmacy to be a part of this patient's care.  Deboraha Sprang 03/10/2016 11:54 AM

## 2016-03-10 NOTE — Progress Notes (Signed)
Patient lying in bed, no needs at this time. Call light within reach. 

## 2016-03-11 LAB — CBC WITH DIFFERENTIAL/PLATELET
Basophils Absolute: 0 10*3/uL (ref 0.0–0.1)
Basophils Relative: 0 %
EOS PCT: 0 %
Eosinophils Absolute: 0 10*3/uL (ref 0.0–0.7)
HCT: 32 % — ABNORMAL LOW (ref 36.0–46.0)
Hemoglobin: 9.8 g/dL — ABNORMAL LOW (ref 12.0–15.0)
LYMPHS ABS: 0.8 10*3/uL (ref 0.7–4.0)
LYMPHS PCT: 8 %
MCH: 27 pg (ref 26.0–34.0)
MCHC: 30.6 g/dL (ref 30.0–36.0)
MCV: 88.2 fL (ref 78.0–100.0)
MONOS PCT: 9 %
Monocytes Absolute: 0.9 10*3/uL (ref 0.1–1.0)
Neutro Abs: 8.5 10*3/uL — ABNORMAL HIGH (ref 1.7–7.7)
Neutrophils Relative %: 83 %
PLATELETS: 352 10*3/uL (ref 150–400)
RBC: 3.63 MIL/uL — AB (ref 3.87–5.11)
RDW: 16 % — ABNORMAL HIGH (ref 11.5–15.5)
WBC: 10.2 10*3/uL (ref 4.0–10.5)

## 2016-03-11 LAB — BASIC METABOLIC PANEL
Anion gap: 7 (ref 5–15)
BUN: 20 mg/dL (ref 6–20)
CALCIUM: 8.1 mg/dL — AB (ref 8.9–10.3)
CHLORIDE: 102 mmol/L (ref 101–111)
CO2: 31 mmol/L (ref 22–32)
CREATININE: 1.01 mg/dL — AB (ref 0.44–1.00)
GFR calc non Af Amer: 51 mL/min — ABNORMAL LOW (ref >60)
GFR, EST AFRICAN AMERICAN: 59 mL/min — AB (ref >60)
GLUCOSE: 137 mg/dL — AB (ref 65–99)
Potassium: 4.6 mmol/L (ref 3.5–5.1)
Sodium: 140 mmol/L (ref 135–145)

## 2016-03-11 LAB — MAGNESIUM: Magnesium: 1.8 mg/dL (ref 1.7–2.4)

## 2016-03-11 MED ORDER — FUROSEMIDE 10 MG/ML IJ SOLN
20.0000 mg | Freq: Once | INTRAMUSCULAR | Status: AC
  Administered 2016-03-11: 20 mg via INTRAVENOUS
  Filled 2016-03-11: qty 2

## 2016-03-11 NOTE — Progress Notes (Signed)
PROGRESS NOTE    Paula Contreras  GBT:[id number] DOB: 1933-09-25 DOA: 03/07/2016   PCP: Henrine Screws, MD   Brief Narrative:  82 y.o. WF PMHx Dementia, Paroxysmal A-Fib on Xarelto, AS, Bradycardia, Chronic Diastolic CHF, HTN, COPD on 3L at baseline CKD stage III, recently admitted to hospital for ascending cholangitis, underwent ERCP with sphincterotomy, treated with Unasyn and discharged on Augmentin 5 days PTA, now presented with hypoxia and lethargy. Pt is a resident of SNF.  ED course: - Febrile to 100.75F, heart rate 115, respirations 26+, pulse ox 100% on oxymask, BP initially 119/67, dropped to 89/50 while in the ER - Na 141, K 3.9, Cr 1.19 (baseline 1.0), WBC 13.2K - CXR showed bilateral interstitial markings, no definite pneumonia - Lactic acid 2.3 - ECG showed sinus tachycardia - given 30 cc/kg fluids, vancomycin and cefepime administered after cultures were obtained, TRH were asked to evaluate for sepsis from pneumonia  Subjective:  Reports feeling better, denies chest pain or dyspnea.   Assessment & Plan:   Sepsis from HCAP, bilateral lobes  - Suspected source pneumonia. Organism unknown - Patient met sepsis criteria given tachycardia, tachypnea, fever, leukocytosis, and evidence of organ dysfunction.   - lactic acid cleared and 1.2 this AM which is WNL  - blood, urine, Legionella negative  - was on Vancomycin and Maxipime, changed ABX to oral Levaquin 12/09 - repeat CXR with persistent bibasilar pleural effusions and atelectasis - IS when awake at least ~2 hours  - will add one dose Lasix to see it that would help with pleural effusions               COPD with exacerbation - minimal exp wheezing on exam this AM - continue to provide BD's scheduled and as needed  - continue to taper down steroids  - Mucinex DM  Acute on Chronic respiratory failure with hypoxia due to COPD - secondary to the above - Baseline pCO2 elevated.   - On home O2 3L at home. -  Titrate O2 to maintain SPO2 89-93%  Paroxysmal Atrial Fib(CHADS2Vasc 4) - On Xarelto - rate controlled   Acute kidney injury  - secondary to sepsis - resolved with IVF - will repeat in AM  Troponin elevation - suspect from demand ischemia from sepsis  - no chest pain, no need to trend enzymes at this time  Acute blood loss anemia, ? IDA - hg drop overnight 12/07 from 8.1 --> 6.7, transfused two U PRBC on 12/08 and Hg appropriately up to 9.8, still stable with no change in the past 24 hours - CBC in AM  Diastolic dysfunction, chronic  - ECHO done, reading pending  - monitor daily weights - weight since admission: 160 --> 165 --> 166 lbs this AM  DVT prophylaxis: Xarelto Code Status: DO NOT RESUSCITATE Family Communication: None Disposition Plan: SNF in 1-2 days   Consultants:   None  Procedures/Significant Events:   Echocardiogram pending  Cultures:  12/6 blood  2 pending  12/7 urine pending  12/7 strep pneumo urine antigen negative  12/7 legionella pending  Antimicrobials:  Cefepime 12/07 --> 12/09  Vancomycin 12/07 --> 12/09  Zithromax 12/07 --> 12/09  Levaquin 12/09 -->  Objective: Vitals:   03/10/16 1957 03/10/16 2133 03/11/16 0534 03/11/16 0832  BP: (!) 153/68  (!) 148/77   Pulse: 63 60 64   Resp: _0 Temp: 97.7 F (36.5 C)  98.3 F (36.8 C)   TempSrc: Oral  Oral  SpO2: 96%  96% 96%  Weight:   75.5 kg (166 lb 7.2 oz)   Height:        Intake/Output Summary (Last 24 hours) at 03/11/16 1044 Last data filed at 03/11/16 0900  Gross per 24 hour  Intake              460 ml  Output                0 ml  Net              460 ml   Filed Weights   03/09/16 0456 03/10/16 0459 03/11/16 0534  Weight: 75 kg (165 lb 5.5 oz) 74.8 kg (164 lb 14.5 oz) 75.5 kg (166 lb 7.2 oz)    Examination:  General: A/O 2, NAD, following commands appropriately  Lungs: minimal exp wheezing mostly bibasilar, mild bibasilar crackles   Cardiovascular:  Irregular irregular rhythm and rate, without murmur gallop or rub normal S1 and S2 Abdomen: negative abdominal pain, nondistended, positive soft, bowel sounds, no rebound, no ascites, no appreciable mass   Data Reviewed: Care during the described time interval was provided by me .  I have reviewed this patient's available data, including medical history, events of note, physical examination, and all test results as part of my evaluation.  CBC:  Recent Labs Lab 03/07/16 2304 03/08/16 0847 03/09/16 0224 03/10/16 0649 03/11/16 0534  WBC 13.2* 13.5* 15.4* 14.1* 10.2  NEUTROABS 10.5* 12.7* 14.2* 12.3* 8.5*  HGB 9.3* 8.1* 6.7* 9.8* 9.8*  HCT 31.7* 27.1* 22.6* 32.6* 32.0*  MCV 89.8 88.3 88.6 86.7 88.2  PLT 444* 353 318 371 803   Basic Metabolic Panel:  Recent Labs Lab 03/07/16 2304 03/08/16 0847 03/09/16 0224 03/10/16 0649 03/11/16 0212  NA 141 141 143 140 140  K 3.9 3.9 4.5 4.1 4.6  CL 91* 99* 105 103 102  CO2 36* _0 GLUCOSE 126* 165* 148* 123* 137*  BUN _1 CREATININE 1.19* 1.08* 0.94 0.97 1.01*  CALCIUM 10.3 8.8* 8.0* 8.3* 8.1*  MG  --  1.7 1.7 1.9 1.8   Liver Function Tests:  Recent Labs Lab 03/07/16 2304  AST 25  ALT 26  ALKPHOS 114  BILITOT 0.4  PROT 6.8  ALBUMIN 2.9*   Coagulation Profile:  Recent Labs Lab 03/07/16 2304  INR 1.01   Cardiac Enzymes:  Recent Labs Lab 03/07/16 2349  TROPONINI 0.09*   Urine analysis:    Component Value Date/Time   COLORURINE AMBER (A) 02/24/2016 1650   APPEARANCEUR CLEAR 02/24/2016 1650   LABSPEC 1.017 02/24/2016 1650   PHURINE 7.5 02/24/2016 1650   GLUCOSEU NEGATIVE 02/24/2016 1650   HGBUR NEGATIVE 02/24/2016 1650   BILIRUBINUR SMALL (A) 02/24/2016 1650   KETONESUR NEGATIVE 02/24/2016 1650   PROTEINUR 30 (A) 02/24/2016 1650   UROBILINOGEN 0.2 01/11/2015 2337   NITRITE NEGATIVE 02/24/2016 1650   LEUKOCYTESUR NEGATIVE 02/24/2016 1650    Radiology Studies: Dg Chest 2 View  Result  Date: 03/10/2016 CLINICAL DATA:  Acute on chronic dyspnea. Pt denies CP. Hx of HTN, COPD, CKD, chronic respiratory failure, PAF, bradycardia, dementia, PNA. Ex-smoker, quit x10 years. EXAM: CHEST  2 VIEW COMPARISON:  03/07/2016 FINDINGS: Small to moderate bilateral pleural effusions with passive atelectasis. Densely atherosclerotic aortic arch. Prominence of the main pulmonary artery suggesting pulmonary arterial hypertension although some of the contour prominence is also due to leftward rotation of the chest. Cephalization of blood flow compatible with  pulmonary venous hypertension, without overt edema. There are lower thoracic and upper lumbar compression fractures as shown on prior CT exam 02/25/2016. IMPRESSION: 1. Small to moderate bilateral pleural effusions with passive atelectasis. 2. Pulmonary venous hypertension. There is also pulmonary arterial hypertension with prominence of the main pulmonary artery. 3. Atherosclerotic aortic arch. 4. Compression fractures in the lower thoracic spine and upper lumbar spine as shown on recent CT abdomen. Electronically Signed   By: Van Clines M.D.   On: 03/10/2016 12:20    Scheduled Meds: . feeding supplement  1 Container Oral BID BM  . ipratropium-albuterol  3 mL Nebulization BID  . levofloxacin  750 mg Oral Q48H  . mouth rinse  15 mL Mouth Rinse BID  . pantoprazole  40 mg Oral Daily  . predniSONE  50 mg Oral Q breakfast  . rivaroxaban  20 mg Oral Q supper  . sodium chloride flush  3 mL Intravenous Q12H   Continuous Infusions:    LOS: 3 days   Time spent: 25 minutes  Faye Ramsay, MD [facility] Pager [phone]  If 7PM-7AM, please contact night-coverage [email or web address] Password [email or web address] 03/11/2016, 10:44 AM

## 2016-03-11 NOTE — Progress Notes (Signed)
Patient lying in bed, no needs at this time. Bed alarm on, call light within reach

## 2016-03-12 LAB — CBC WITH DIFFERENTIAL/PLATELET
BASOS ABS: 0 10*3/uL (ref 0.0–0.1)
Basophils Relative: 0 %
Eosinophils Absolute: 0 10*3/uL (ref 0.0–0.7)
Eosinophils Relative: 0 %
HEMATOCRIT: 34.1 % — AB (ref 36.0–46.0)
Hemoglobin: 10.4 g/dL — ABNORMAL LOW (ref 12.0–15.0)
LYMPHS PCT: 13 %
Lymphs Abs: 1.2 10*3/uL (ref 0.7–4.0)
MCH: 26.8 pg (ref 26.0–34.0)
MCHC: 30.5 g/dL (ref 30.0–36.0)
MCV: 87.9 fL (ref 78.0–100.0)
MONO ABS: 1.1 10*3/uL — AB (ref 0.1–1.0)
Monocytes Relative: 12 %
NEUTROS ABS: 6.6 10*3/uL (ref 1.7–7.7)
Neutrophils Relative %: 75 %
PLATELETS: 343 10*3/uL (ref 150–400)
RBC: 3.88 MIL/uL (ref 3.87–5.11)
RDW: 15.7 % — AB (ref 11.5–15.5)
WBC: 8.9 10*3/uL (ref 4.0–10.5)

## 2016-03-12 LAB — BASIC METABOLIC PANEL
ANION GAP: 11 (ref 5–15)
BUN: 22 mg/dL — ABNORMAL HIGH (ref 6–20)
CO2: 33 mmol/L — ABNORMAL HIGH (ref 22–32)
Calcium: 8.2 mg/dL — ABNORMAL LOW (ref 8.9–10.3)
Chloride: 97 mmol/L — ABNORMAL LOW (ref 101–111)
Creatinine, Ser: 0.96 mg/dL (ref 0.44–1.00)
GFR, EST NON AFRICAN AMERICAN: 54 mL/min — AB (ref >60)
Glucose, Bld: 96 mg/dL (ref 65–99)
POTASSIUM: 3.7 mmol/L (ref 3.5–5.1)
SODIUM: 141 mmol/L (ref 135–145)

## 2016-03-12 LAB — MAGNESIUM: Magnesium: 1.9 mg/dL (ref 1.7–2.4)

## 2016-03-12 LAB — PROCALCITONIN: Procalcitonin: <0.1 ng/mL

## 2016-03-12 MED ORDER — PREDNISONE 20 MG PO TABS
40.0000 mg | ORAL_TABLET | Freq: Every day | ORAL | Status: DC
  Administered 2016-03-13: 40 mg via ORAL
  Filled 2016-03-12: qty 2

## 2016-03-12 MED ORDER — FUROSEMIDE 20 MG PO TABS
20.0000 mg | ORAL_TABLET | Freq: Every day | ORAL | Status: DC
  Administered 2016-03-12: 20 mg via ORAL
  Filled 2016-03-12 (×2): qty 1

## 2016-03-12 NOTE — Clinical Social Work Note (Signed)
Clinical Social Work Assessment  Patient Details  Name: Paula Contreras MRN: [id number] Date of Birth: Feb 04, 1934  Date of referral:  03/12/16               Reason for consult:  Discharge Planning                Permission sought to share information with:  Family Supports Permission granted to share information::     Name::     Paula Contreras  Agency::     Relationship::  Friend  Contact Information:  [phone]  Housing/Transportation Living arrangements for the past 2 months:  [place] of Information:  Patient Patient Interpreter Needed:  None Criminal Activity/Legal Involvement Pertinent to Current Situation/Hospitalization:  No - Comment as needed Significant Relationships:  Other Family Members Lives with:  Facility Resident ([facility]) Do you feel safe going back to the place where you live?  Yes Need for family participation in patient care:  Yes (Comment)  Care giving concerns: No family/friends at discharge. Patient stated that she has extended family that came to visit her  Social Worker assessment / plan:  Clinical Social Worker met patient at discharge to offer support and discuss any discharge needs. Patient states she has been living at [facility] and is agreeable to go back after discharge. Patient is agreeable to go to SNF at [facility] facility. CSW to complete necessary paperwork and initiate SNF placement. CSW spoke to admission coordinator  for [facility] and they verbally offered her a SNF bed. CSW remains available for support and to facilitate patient discharge needs once medically ready.  Employment status:  Retired Forensic scientist:  Medicare PT Recommendations:  [place] / Referral to community resources:  [place]  Patient/Family's Response to care:  Patient verbalized appreciation and understanding for CSW role and involvement in care. Patient is agreeable with  current discharge plans to SNF following discharge.  Patient/Family's Understanding of and Emotional Response to Diagnosis, Current Treatment, and Prognosis:  Patient with good understanding of current medical state and limitations around most recent hospitalization. Patient is agreeable with SNF placement in hopes of transitioning back to her assisted living.   Emotional Assessment Appearance:  Appears stated age Attitude/Demeanor/Rapport:  Other (attitude/demeanor appropriate) Affect (typically observed):  Pleasant Orientation:  Oriented to Self, Oriented to Place, Oriented to  Time, Oriented to Situation Alcohol / Substance use:  Not Applicable Psych involvement (Current and /or in the community):  Outpatient Provider  Discharge Needs  Concerns to be addressed:  No discharge needs identified Readmission within the last 30 days:  Yes Current discharge risk:  None Barriers to Discharge:  No Barriers Identified   Rhea Pink, MSW,  [place]

## 2016-03-12 NOTE — Care Management Important Message (Signed)
Important Message  Patient Details  Name: Paula Contreras MRN: [id number] Date of Birth: 01-12-1934   Medicare Important Message Given:  Yes    Nathen May 03/12/2016, 9:56 AM

## 2016-03-12 NOTE — Progress Notes (Signed)
Physical Therapy Treatment Patient Details Name: Paula Contreras MRN: [id number] DOB: [birth year] Today's Date: 03/12/2016    History of Present Illness 82 y.o. female PMHx Dementia, Paroxysmal A-Fib on Xarelto, AS, Bradycardia, Chronic Diastolic CHF, HTN, COPD on 3L at baseline CKD stage III. Now presented with hypoxia and lethargy.    PT Comments    Pt walked short distance with max encouragment.  Pt negative toward everything.  O2 sats remained in the 90s with activity.  Pt needs to increase her activity level  Follow Up Recommendations  SNF;Supervision for mobility/OOB     Equipment Recommendations  None recommended by PT    Recommendations for Other Services       Precautions / Restrictions Precautions Precautions: Fall Precaution Comments: chronic O2 - 2L Restrictions Weight Bearing Restrictions: No    Mobility  Bed Mobility Overal bed mobility: Needs Assistance       Supine to sit: HOB elevated;Min guard        Transfers Overall transfer level: Needs assistance Equipment used: Rolling walker (2 wheeled) Transfers: Sit to/from Stand Sit to Stand: Mod assist;+2 physical assistance         General transfer comment: pt with very little assist getting into standing - complaining of weak legs  Ambulation/Gait Ambulation/Gait assistance: Min assist;+2 safety/equipment Ambulation Distance (Feet): 6 Feet Assistive device: Rolling walker (2 wheeled) Gait Pattern/deviations: Trunk flexed;Shuffle;Decreased stride length     General Gait Details: pt complained of weak legs the entire walk.  she did 6 feet with encouragement.  chair pulled up behind her.  pt reminded to breath through her nose when up   Stairs            Wheelchair Mobility    Modified Rankin (Stroke Patients Only)       Balance                                    Cognition Arousal/Alertness: Awake/alert Behavior During Therapy: Agitated (pt reluctant to do  anything) Overall Cognitive Status: History of cognitive impairments - at baseline                      Exercises      General Comments        Pertinent Vitals/Pain Pain Assessment: No/denies pain    Home Living                      Prior Function            PT Goals (current goals can now be found in the care plan section) Progress towards PT goals: Progressing toward goals    Frequency    Min 2X/week      PT Plan Current plan remains appropriate    Co-evaluation             End of Session Equipment Utilized During Treatment: Gait belt;Oxygen Activity Tolerance: Patient limited by fatigue Patient left: in chair;with chair alarm set;with call bell/phone within reach     Time: 1020-1045 PT Time Calculation (min) (ACUTE ONLY): 25 min  Charges:  $Therapeutic Activity: 23-37 mins                    G Codes:      Loyal Buba 03/12/2016, 10:58 AM 03/12/2016   Rande Lawman, PT

## 2016-03-12 NOTE — Progress Notes (Signed)
PROGRESS NOTE    Paula Contreras  NTI:[id number] DOB: December 19, 1933 DOA: 03/07/2016   PCP: Henrine Screws, MD   Brief Narrative:  82 y.o. WF PMHx Dementia, Paroxysmal A-Fib on Xarelto, AS, Bradycardia, Chronic Diastolic CHF, HTN, COPD on 3L at baseline CKD stage III, recently admitted to hospital for ascending cholangitis, underwent ERCP with sphincterotomy, treated with Unasyn and discharged on Augmentin 5 days PTA, now presented with hypoxia and lethargy. Pt is a resident of SNF.  ED course: - Febrile to 100.56F, heart rate 115, respirations 26+, pulse ox 100% on oxymask, BP initially 119/67, dropped to 89/50 while in the ER - Na 141, K 3.9, Cr 1.19 (baseline 1.0), WBC 13.2K - CXR showed bilateral interstitial markings, no definite pneumonia - Lactic acid 2.3 - ECG showed sinus tachycardia - given 30 cc/kg fluids, vancomycin and cefepime administered after cultures were obtained, TRH were asked to evaluate for sepsis from pneumonia  Subjective:  Reports feeling better, denies chest pain or dyspnea.   Assessment & Plan:   Sepsis from HCAP, bilateral lobes  - Suspected source pneumonia. Organism unknown - Patient met sepsis criteria given tachycardia, tachypnea, fever, leukocytosis, and evidence of organ dysfunction.   - lactic acid cleared and 1.2 this AM which is WNL  - blood, urine, Legionella negative  - was on Vancomycin and Maxipime, changed ABX to oral Levaquin 12/09 - repeat CXR with persistent bibasilar pleural effusions and atelectasis - IS when awake at least ~2 hours   COPD with exacerbation - no wheezing on exam this AM  - continue to provide BD's scheduled and as needed  - continue to taper down steroids  - Mucinex DM - encouraged ambulation   Acute on Chronic respiratory failure with hypoxia due to COPD - secondary to the above - Baseline pCO2 elevated.   - On home O2 3L at home. - Titrate O2 to maintain SPO2 89-93%  Paroxysmal Atrial Fib(CHADS2Vasc 4) -  On Xarelto - rate controlled   Acute kidney injury  - secondary to sepsis - resolved with IVF  Troponin elevation - suspect from demand ischemia from sepsis  - no chest pain, no need to trend enzymes at this time  Acute blood loss anemia, ? IDA - hg drop overnight 12/07 from 8.1 --> 6.7, transfused two U PRBC on 12/08 and Hg appropriately up to 9.8, still stable with no change in the past 24 hours - CBC in AM  Diastolic dysfunction, chronic  - ECHO done, EF 55% and stable diastolic CHF  - monitor daily weights - weight since admission: 160 --> 165 --> 166 --> 164 lbs this AM - keep on low dose lasix 20 mg PO   DVT prophylaxis: Xarelto Code Status: DO NOT RESUSCITATE Family Communication: None Disposition Plan: SNF in AM  Consultants:   None  Procedures/Significant Events:   Echocardiogram pending  Cultures:  12/6 blood  2 pending  12/7 urine pending  12/7 strep pneumo urine antigen negative  12/7 legionella pending  Antimicrobials:  Cefepime 12/07 --> 12/09  Vancomycin 12/07 --> 12/09  Zithromax 12/07 --> 12/09  Levaquin 12/09 -->  Objective: Vitals:   03/11/16 2010 03/12/16 0455 03/12/16 0741 03/12/16 1547  BP: 139/72 (!) 147/63  (!) 143/73  Pulse: 63 (!) 51  77  Resp: _0 Temp: 98.3 F (36.8 C) 98.2 F (36.8 C)  98.6 F (37 C)  TempSrc: Oral Oral  Oral  SpO2:  98% 95% 95%  Weight:  74.5 kg (164  lb 3.9 oz)    Height:        Intake/Output Summary (Last 24 hours) at 03/12/16 1616 Last data filed at 03/11/16 1700  Gross per 24 hour  Intake              110 ml  Output                0 ml  Net              110 ml   Filed Weights   03/10/16 0459 03/11/16 0534 03/12/16 0455  Weight: 74.8 kg (164 lb 14.5 oz) 75.5 kg (166 lb 7.2 oz) 74.5 kg (164 lb 3.9 oz)    Examination:  General: A/O 2, NAD, following commands appropriately  Lungs: minimal exp wheezing mostly bibasilar, mild bibasilar crackles   Cardiovascular: Irregular  irregular rhythm and rate, without murmur gallop or rub normal S1 and S2 Abdomen: negative abdominal pain, nondistended, positive soft, bowel sounds, no rebound, no ascites, no appreciable mass   Data Reviewed: Care during the described time interval was provided by me .  I have reviewed this patient's available data, including medical history, events of note, physical examination, and all test results as part of my evaluation.  CBC:  Recent Labs Lab 03/08/16 0847 03/09/16 0224 03/10/16 0649 03/11/16 0534 03/12/16 0438  WBC 13.5* 15.4* 14.1* 10.2 8.9  NEUTROABS 12.7* 14.2* 12.3* 8.5* 6.6  HGB 8.1* 6.7* 9.8* 9.8* 10.4*  HCT 27.1* 22.6* 32.6* 32.0* 34.1*  MCV 88.3 88.6 86.7 88.2 87.9  PLT 353 318 371 352 641   Basic Metabolic Panel:  Recent Labs Lab 03/08/16 0847 03/09/16 0224 03/10/16 0649 03/11/16 0212 03/12/16 0438  NA 141 143 140 140 141  K 3.9 4.5 4.1 4.6 3.7  CL 99* 105 103 102 97*  CO2 _0 33*  GLUCOSE 165* 148* 123* 137* 96  BUN _1 22*  CREATININE 1.08* 0.94 0.97 1.01* 0.96  CALCIUM 8.8* 8.0* 8.3* 8.1* 8.2*  MG 1.7 1.7 1.9 1.8 1.9   Liver Function Tests:  Recent Labs Lab 03/07/16 2304  AST 25  ALT 26  ALKPHOS 114  BILITOT 0.4  PROT 6.8  ALBUMIN 2.9*   Coagulation Profile:  Recent Labs Lab 03/07/16 2304  INR 1.01   Cardiac Enzymes:  Recent Labs Lab 03/07/16 2349  TROPONINI 0.09*   Urine analysis:    Component Value Date/Time   COLORURINE AMBER (A) 02/24/2016 1650   APPEARANCEUR CLEAR 02/24/2016 1650   LABSPEC 1.017 02/24/2016 1650   PHURINE 7.5 02/24/2016 1650   GLUCOSEU NEGATIVE 02/24/2016 1650   HGBUR NEGATIVE 02/24/2016 1650   BILIRUBINUR SMALL (A) 02/24/2016 1650   KETONESUR NEGATIVE 02/24/2016 1650   PROTEINUR 30 (A) 02/24/2016 1650   UROBILINOGEN 0.2 01/11/2015 2337   NITRITE NEGATIVE 02/24/2016 1650   LEUKOCYTESUR NEGATIVE 02/24/2016 1650    Radiology Studies: No results found.  Scheduled Meds: .  feeding supplement  1 Container Oral BID BM  . ipratropium-albuterol  3 mL Nebulization BID  . levofloxacin  750 mg Oral Q48H  . mouth rinse  15 mL Mouth Rinse BID  . pantoprazole  40 mg Oral Daily  . predniSONE  50 mg Oral Q breakfast  . rivaroxaban  20 mg Oral Q supper  . sodium chloride flush  3 mL Intravenous Q12H   Continuous Infusions:    LOS: 4 days   Time spent: 25 minutes  MAGICK-Flois Mctague, Rebecca Eaton, MD [facility] Pager  [phone]  If 7PM-7AM, please contact night-coverage [email or web address] Password [email or web address] 03/12/2016, 4:16 PM

## 2016-03-12 NOTE — Clinical Social Work Placement (Signed)
   CLINICAL SOCIAL WORK PLACEMENT  NOTE  Date:  03/12/2016  Patient Details  Name: Paula Contreras MRN: [id number] Date of Birth: Feb 03, 1934  Clinical Social Work is seeking post-discharge placement for this patient at the [place] level of care (*CSW will initial, date and re-position this form in  chart as items are completed):  Yes   Patient/family provided with [place] Work Department's list of facilities offering this level of care within the geographic area requested by the patient (or if unable, by the patient's family).  Yes   Patient/family informed of their freedom to choose among providers that offer the needed level of care, that participate in Medicare, Medicaid or managed care program needed by the patient, have an available bed and are willing to accept the patient.  Yes   Patient/family informed of [facility]'s ownership interest in [facility] and [facility], as well as of the fact that they are under no obligation to receive care at these facilities.  PASRR submitted to EDS on       PASRR number received on       Existing PASRR number confirmed on       FL2 transmitted to all facilities in geographic area requested by pt/family on       FL2 transmitted to all facilities within larger geographic area on       Patient informed that his/her managed care company has contracts with or will negotiate with certain facilities, including the following:            Patient/family informed of bed offers received.  Patient chooses bed at       Physician recommends and patient chooses bed at      Patient to be transferred to   on  .  Patient to be transferred to facility by       Patient family notified on   of transfer.  Name of family member notified:        PHYSICIAN Please sign FL2     Additional Comment:    _______________________________________________ Rhea Pink, MSW,  [place] [phone]

## 2016-03-12 NOTE — NC FL2 (Signed)
[place] LEVEL OF CARE SCREENING TOOL     IDENTIFICATION  Patient Name: Paula Contreras Birthdate: 06-25-1933 Sex: female Admission Date (Current Location): 03/07/2016  [facility] and [place] Number:  Herbalist and Address:  [facility]. [facility], [place] [address], [place], [place] [address]      Provider Number: [id number]  Attending Physician Name and Address:  Theodis Blaze, MD  Relative Name and Phone Number:       Current Level of Care: Hospital Recommended Level of Care: [place] Prior Approval Number:    Date Approved/Denied:   PASRR Number: [id number]  Discharge Plan: SNF    Current Diagnoses: Patient Active Problem List   Diagnosis Date Noted  . Pressure injury of skin 03/10/2016  . Sepsis ([place]) 03/08/2016  . CAP (community acquired pneumonia) 03/08/2016  . Sepsis due to pneumonia ([place])   . COPD exacerbation ([place])   . Acute on chronic respiratory failure with hypoxia ([place])   . Paroxysmal atrial fibrillation (HCC)   . Diastolic dysfunction   . Abnormal LFTs   . Bile duct stone 02/25/2016  . Chronic anticoagulation - Xerelto 02/25/2016  . Ascending cholangitis due to choledocolithiasis 02/25/2016  . HCAP (healthcare-associated pneumonia) 02/24/2016  . LFTs abnormal 02/24/2016  . Epigastric pain 02/24/2016  . Nausea and vomiting 02/24/2016  . History of bradycardia 10/27/2015  . SOB (shortness of breath)   . Encounter for palliative care   . Hypernatremia 02/21/2015  . Altered mental status 02/20/2015  . Metabolic encephalopathy 58/52/7782  . Weakness generalized 02/20/2015  . Dehydration 02/20/2015  . Leukocytosis 02/20/2015  . Anemia 02/20/2015  . Bronchitis 02/20/2015  . Fall at nursing home 02/20/2015  . Bradycardia 04/09/2014  . Chronic respiratory failure ([place])   . Aortic stenosis   . PAF (paroxysmal atrial fibrillation) ([place])   . CKD (chronic kidney disease), stage III   . Aortic valve  disorder 10/30/2013  . Undiagnosed cardiac murmurs 07/21/2013  . Hypertension 10/02/2010  . COPD (chronic obstructive pulmonary disease) ([place]) 10/02/2010    Orientation RESPIRATION BLADDER Height & Weight     Time, Situation, Place, Self  O2 (2) Incontinent Weight: 164 lb 3.9 oz (74.5 kg) Height:  '5\' 7"'$  (170.2 cm)  BEHAVIORAL SYMPTOMS/MOOD NEUROLOGICAL BOWEL NUTRITION STATUS  Other (Comment)   Incontinent Diet (Diet regular/fluid consistency thin)  AMBULATORY STATUS COMMUNICATION OF NEEDS Skin   Limited Assist Verbally Normal                       Personal Care Assistance Level of Assistance  Bathing, Feeding, Dressing Bathing Assistance: Limited assistance Feeding assistance: Independent Dressing Assistance: Limited assistance     Functional Limitations Info  Sight, Hearing, Speech Sight Info: Adequate Hearing Info: Impaired Speech Info: Adequate    SPECIAL CARE FACTORS FREQUENCY  PT (By licensed PT), OT (By licensed OT)     PT Frequency: 5x week OT Frequency: 5x week            Contractures      Additional Factors Info  Code Status Code Status Info: DNR Allergies Info: CODEINE, ROBITUSSIN            Current Medications (03/12/2016):  This is the current hospital active medication list Current Facility-Administered Medications  Medication Dose Route Frequency Provider Last Rate Last Dose  . acetaminophen (TYLENOL) tablet 650 mg  650 mg Oral Q6H PRN Edwin Dada, MD  Or  . acetaminophen (TYLENOL) suppository 650 mg  650 mg Rectal Q6H PRN Edwin Dada, MD      . albuterol (PROVENTIL) (2.5 MG/3ML) 0.083% nebulizer solution 2.5 mg  2.5 mg Nebulization Q2H PRN Edwin Dada, MD      . feeding supplement (BOOST / RESOURCE BREEZE) liquid 1 Container  1 Container Oral BID BM Edwin Dada, MD   1 Container at 03/11/16 1400  . ipratropium-albuterol (DUONEB) 0.5-2.5 (3) MG/3ML nebulizer solution 3 mL  3 mL Nebulization BID  Theodis Blaze, MD   3 mL at 03/12/16 0741  . levofloxacin (LEVAQUIN) tablet 750 mg  750 mg Oral Q48H Otilio Miu, RPH   750 mg at 03/10/16 1203  . MEDLINE mouth rinse  15 mL Mouth Rinse BID Theodis Blaze, MD   15 mL at 03/11/16 1000  . ondansetron (ZOFRAN) tablet 4 mg  4 mg Oral Q6H PRN Edwin Dada, MD       Or  . ondansetron (ZOFRAN) injection 4 mg  4 mg Intravenous Q6H PRN Edwin Dada, MD      . pantoprazole (PROTONIX) EC tablet 40 mg  40 mg Oral Daily Edwin Dada, MD   40 mg at 03/12/16 1019  . predniSONE (DELTASONE) tablet 50 mg  50 mg Oral Q breakfast Theodis Blaze, MD   50 mg at 03/12/16 0900  . rivaroxaban (XARELTO) tablet 20 mg  20 mg Oral Q supper Skeet Simmer, RPH   20 mg at 03/11/16 1725  . senna-docusate (Senokot-S) tablet 1 tablet  1 tablet Oral QHS PRN Edwin Dada, MD      . sodium chloride flush (NS) 0.9 % injection 3 mL  3 mL Intravenous Q12H Edwin Dada, MD   3 mL at 03/12/16 1019     Discharge Medications: Please see discharge summary for a list of discharge medications.  Relevant Imaging Results:  Relevant Lab Results:   Additional Information [phone]  Wende Neighbors, LCSW

## 2016-03-12 NOTE — NC FL2 (Signed)
[place] LEVEL OF CARE SCREENING TOOL     IDENTIFICATION  Patient Name: Paula Contreras Birthdate: 05-Nov-1933 Sex: female Admission Date (Current Location): 03/07/2016  [facility] and [place] Number:  Herbalist and Address:  [facility]. [facility], [place] [address] [place], [place] [address]      Provider Number: [id number]  Attending Physician Name and Address:  Theodis Blaze, MD  Relative Name and Phone Number:       Current Level of Care: Hospital Recommended Level of Care: [place] Prior Approval Number:    Date Approved/Denied:   PASRR Number:    Discharge Plan: SNF    Current Diagnoses: Patient Active Problem List   Diagnosis Date Noted  . Pressure injury of skin 03/10/2016  . Sepsis ([place]) 03/08/2016  . CAP (community acquired pneumonia) 03/08/2016  . Sepsis due to pneumonia ([place])   . COPD exacerbation ([place])   . Acute on chronic respiratory failure with hypoxia ([place])   . Paroxysmal atrial fibrillation (HCC)   . Diastolic dysfunction   . Abnormal LFTs   . Bile duct stone 02/25/2016  . Chronic anticoagulation - Xerelto 02/25/2016  . Ascending cholangitis due to choledocolithiasis 02/25/2016  . HCAP (healthcare-associated pneumonia) 02/24/2016  . LFTs abnormal 02/24/2016  . Epigastric pain 02/24/2016  . Nausea and vomiting 02/24/2016  . History of bradycardia 10/27/2015  . SOB (shortness of breath)   . Encounter for palliative care   . Hypernatremia 02/21/2015  . Altered mental status 02/20/2015  . Metabolic encephalopathy 25/95/6387  . Weakness generalized 02/20/2015  . Dehydration 02/20/2015  . Leukocytosis 02/20/2015  . Anemia 02/20/2015  . Bronchitis 02/20/2015  . Fall at nursing home 02/20/2015  . Bradycardia 04/09/2014  . Chronic respiratory failure ([place])   . Aortic stenosis   . PAF (paroxysmal atrial fibrillation) ([place])   . CKD (chronic kidney disease), stage III   . Aortic valve disorder  10/30/2013  . Undiagnosed cardiac murmurs 07/21/2013  . Hypertension 10/02/2010  . COPD (chronic obstructive pulmonary disease) ([place]) 10/02/2010    Orientation RESPIRATION BLADDER Height & Weight     Time, Situation, Place, Self  O2 (2) Incontinent Weight: 164 lb 3.9 oz (74.5 kg) Height:  '5\' 7"'$  (170.2 cm)  BEHAVIORAL SYMPTOMS/MOOD NEUROLOGICAL BOWEL NUTRITION STATUS  Other (Comment)   Incontinent Diet (Diet regular/fluid consistency thin)  AMBULATORY STATUS COMMUNICATION OF NEEDS Skin   Limited Assist Verbally Normal                       Personal Care Assistance Level of Assistance  Bathing, Feeding, Dressing Bathing Assistance: Limited assistance Feeding assistance: Independent Dressing Assistance: Limited assistance     Functional Limitations Info  Sight, Hearing, Speech Sight Info: Adequate Hearing Info: Impaired Speech Info: Adequate    SPECIAL CARE FACTORS FREQUENCY  PT (By licensed PT), OT (By licensed OT)     PT Frequency: 5x week OT Frequency: 5x week            Contractures      Additional Factors Info  Code Status Code Status Info: DNR Allergies Info: CODEINE, ROBITUSSIN            Current Medications (03/12/2016):  This is the current hospital active medication list Current Facility-Administered Medications  Medication Dose Route Frequency Provider Last Rate Last Dose  . acetaminophen (TYLENOL) tablet 650 mg  650 mg Oral Q6H PRN Edwin Dada, MD  Or  . acetaminophen (TYLENOL) suppository 650 mg  650 mg Rectal Q6H PRN Edwin Dada, MD      . albuterol (PROVENTIL) (2.5 MG/3ML) 0.083% nebulizer solution 2.5 mg  2.5 mg Nebulization Q2H PRN Edwin Dada, MD      . feeding supplement (BOOST / RESOURCE BREEZE) liquid 1 Container  1 Container Oral BID BM Edwin Dada, MD   1 Container at 03/11/16 1400  . ipratropium-albuterol (DUONEB) 0.5-2.5 (3) MG/3ML nebulizer solution 3 mL  3 mL Nebulization BID Theodis Blaze, MD   3 mL at 03/12/16 0741  . levofloxacin (LEVAQUIN) tablet 750 mg  750 mg Oral Q48H Otilio Miu, RPH   750 mg at 03/10/16 1203  . MEDLINE mouth rinse  15 mL Mouth Rinse BID Theodis Blaze, MD   15 mL at 03/11/16 1000  . ondansetron (ZOFRAN) tablet 4 mg  4 mg Oral Q6H PRN Edwin Dada, MD       Or  . ondansetron (ZOFRAN) injection 4 mg  4 mg Intravenous Q6H PRN Edwin Dada, MD      . pantoprazole (PROTONIX) EC tablet 40 mg  40 mg Oral Daily Edwin Dada, MD   40 mg at 03/12/16 1019  . predniSONE (DELTASONE) tablet 50 mg  50 mg Oral Q breakfast Theodis Blaze, MD   50 mg at 03/12/16 0900  . rivaroxaban (XARELTO) tablet 20 mg  20 mg Oral Q supper Skeet Simmer, RPH   20 mg at 03/11/16 1725  . senna-docusate (Senokot-S) tablet 1 tablet  1 tablet Oral QHS PRN Edwin Dada, MD      . sodium chloride flush (NS) 0.9 % injection 3 mL  3 mL Intravenous Q12H Edwin Dada, MD   3 mL at 03/12/16 1019     Discharge Medications: Please see discharge summary for a list of discharge medications.  Relevant Imaging Results:  Relevant Lab Results:   Additional Information [phone]  Wende Neighbors, LCSW

## 2016-03-13 LAB — CBC WITH DIFFERENTIAL/PLATELET
BASOS PCT: 0 %
Basophils Absolute: 0 10*3/uL (ref 0.0–0.1)
EOS ABS: 0 10*3/uL (ref 0.0–0.7)
Eosinophils Relative: 0 %
HCT: 36.1 % (ref 36.0–46.0)
Hemoglobin: 11 g/dL — ABNORMAL LOW (ref 12.0–15.0)
Lymphocytes Relative: 7 %
Lymphs Abs: 0.6 10*3/uL — ABNORMAL LOW (ref 0.7–4.0)
MCH: 26.9 pg (ref 26.0–34.0)
MCHC: 30.5 g/dL (ref 30.0–36.0)
MCV: 88.3 fL (ref 78.0–100.0)
MONO ABS: 0.9 10*3/uL (ref 0.1–1.0)
MONOS PCT: 11 %
NEUTROS PCT: 82 %
Neutro Abs: 6.6 10*3/uL (ref 1.7–7.7)
Platelets: 338 10*3/uL (ref 150–400)
RBC: 4.09 MIL/uL (ref 3.87–5.11)
RDW: 15.4 % (ref 11.5–15.5)
WBC: 8 10*3/uL (ref 4.0–10.5)

## 2016-03-13 LAB — BASIC METABOLIC PANEL
Anion gap: 7 (ref 5–15)
BUN: 22 mg/dL — ABNORMAL HIGH (ref 6–20)
CALCIUM: 8.5 mg/dL — AB (ref 8.9–10.3)
CO2: 41 mmol/L — ABNORMAL HIGH (ref 22–32)
CREATININE: 1.07 mg/dL — AB (ref 0.44–1.00)
Chloride: 93 mmol/L — ABNORMAL LOW (ref 101–111)
GFR, EST AFRICAN AMERICAN: 55 mL/min — AB (ref >60)
GFR, EST NON AFRICAN AMERICAN: 48 mL/min — AB (ref >60)
Glucose, Bld: 101 mg/dL — ABNORMAL HIGH (ref 65–99)
Potassium: 3.9 mmol/L (ref 3.5–5.1)
Sodium: 141 mmol/L (ref 135–145)

## 2016-03-13 LAB — CULTURE, BLOOD (ROUTINE X 2)
CULTURE: NO GROWTH
Culture: NO GROWTH

## 2016-03-13 LAB — MAGNESIUM: MAGNESIUM: 1.9 mg/dL (ref 1.7–2.4)

## 2016-03-13 MED ORDER — PREDNISONE 10 MG PO TABS: ORAL_TABLET | ORAL | 0 refills | Status: DC

## 2016-03-13 MED ORDER — LEVOFLOXACIN 750 MG PO TABS: 750.0000 mg | ORAL_TABLET | ORAL | 0 refills | Status: DC

## 2016-03-13 NOTE — Progress Notes (Signed)
Called [facility] in [place] and gave report to [place] who has taken care of Paula Contreras in the past.  All questions answered. Pt resting with call bell within reach.  Will continue to monitor. Awaiting PTAR to facility. Payton Emerald, RN

## 2016-03-13 NOTE — Discharge Summary (Signed)
Physician Discharge Summary  Paula Contreras MVV:[id number] DOB: Dec 25, 1933 DOA: 03/07/2016  PCP: Henrine Screws, MD  Admit date: 03/07/2016 Discharge date: 03/13/2016  Recommendations for Outpatient Follow-up:  1. Pt will need to follow up with PCP in 1 week post discharge 2. Please obtain BMP to evaluate electrolytes and kidney function 3. Please also check CBC to evaluate Hg and Hct levels 4. Pt to complete three more doses of Levaquin 5. Also continue prednisone taper as outlined below  6. Discharge weight 162 lbs, in future, may consider low dose lasix if needed based on symptoms, for diastolic CHF   Discharge Diagnoses:  Principal Problem:   Sepsis ([place]) Active Problems:   COPD (chronic obstructive pulmonary disease) (H[facility])   Chronic respiratory failure (H[facility])   PAF (paroxysmal atrial fibrillation) ([place])   Discharge Condition: Stable  Diet recommendation: Heart healthy diet discussed in details   Brief Narrative:  82 y.o. WF PMHx Dementia, Paroxysmal A-Fib on Xarelto, AS, Bradycardia, Chronic Diastolic CHF, HTN, COPD on 3L at baselineCKD stage III, recently admitted to hospital for ascending cholangitis, underwent ERCP with sphincterotomy, treated with Unasyn and discharged on Augmentin 5 days PTA, now presented with hypoxia and lethargy. Pt is a resident of SNF.  ED course: - Febrile to 100.70F, heart rate 115, respirations 26+, pulse ox 100% on oxymask, BP initially 119/67, dropped to 89/50 while in the ER - Na 141, K 3.9, Cr 1.19(baseline 1.0),WBC 13.2K - CXR showed bilateral interstitial markings, no definite pneumonia - Lactic acid 2.3 - ECG showed sinus tachycardia - given 30 cc/kg fluids, vancomycin and cefepime administered after cultures were obtained, TRH were asked to evaluate for sepsis from pneumonia  Subjective:  Reports feeling better, denies chest pain or dyspnea.   Assessment & Plan:  Sepsis from HCAP, bilateral lobes  - Suspected source  pneumonia. Organism unknown - Patient met sepsis criteria given tachycardia, tachypnea, fever, leukocytosis, and evidence of organ dysfunction.  - lactic acid cleared and 1.2 this AM which is WNL  - blood, urine, Legionella negative  - was on Vancomycin and Maxipime, changed ABX to oral Levaquin 12/09, complete for three more doses of Levaquin upon discharge  - repeat CXR with persistent bibasilar pleural effusions and atelectasis - IS when awake at least ~2 hours   COPD with exacerbation - no wheezing on exam this AM  - continue to provide BD's scheduled and as needed  - continue to taper down steroids as outlined below  - Mucinex DM - encouraged ambulation   Acute on Chronic respiratory failure with hypoxia due to COPD - secondary to the above - Baseline pCO2 elevated.  - On home O2 3L at home. - Titrate O2 to maintain SPO2 89-93%, stable overall   Paroxysmal Atrial Fib(CHADS2Vasc 4) -On Xarelto - rate controlled   Acute kidney injury  - secondary to sepsis - resolved with IVF  Troponin elevation - suspect from demand ischemia from sepsis  - no chest pain, no need to trend enzymes at this time  Acute blood loss anemia, ? IDA - hg drop overnight 12/07 from 8.1 --> 6.7, transfused two U PRBC on 12/08 and Hg appropriately up to 9.8 - hg 11 this AM, no signs of bleeding   Diastolic dysfunction, chronic  - ECHO done, EF 55% and stable diastolic CHF  - monitor daily weights - weight since admission: 160 --> 165 --> 166 --> 164 --> 162 lbs this AM - lasix completed, Cr slightly up - lasix can be started  if needed based on symptoms   DVT prophylaxis: Xarelto Code Status: DO NOT RESUSCITATE Family Communication: None Disposition Plan: SNF in AM  Consultants:   None  Procedures/Significant Events:   Echocardiogram pending  Cultures:  12/6 blood  2 pending  12/7 urine pending  12/7 strep pneumo urine antigen negative  12/7 legionella  pending  Antimicrobials:  Cefepime 12/07 --> 12/09  Vancomycin 12/07 --> 12/09  Zithromax 12/07 --> 12/09  Levaquin 12/09 -->  Procedures/Studies: Dg Chest 2 View  Result Date: 03/10/2016 CLINICAL DATA:  Acute on chronic dyspnea. Pt denies CP. Hx of HTN, COPD, CKD, chronic respiratory failure, PAF, bradycardia, dementia, PNA. Ex-smoker, quit x10 years. EXAM: CHEST  2 VIEW COMPARISON:  03/07/2016 FINDINGS: Small to moderate bilateral pleural effusions with passive atelectasis. Densely atherosclerotic aortic arch. Prominence of the main pulmonary artery suggesting pulmonary arterial hypertension although some of the contour prominence is also due to leftward rotation of the chest. Cephalization of blood flow compatible with pulmonary venous hypertension, without overt edema. There are lower thoracic and upper lumbar compression fractures as shown on prior CT exam 02/25/2016. IMPRESSION: 1. Small to moderate bilateral pleural effusions with passive atelectasis. 2. Pulmonary venous hypertension. There is also pulmonary arterial hypertension with prominence of the main pulmonary artery. 3. Atherosclerotic aortic arch. 4. Compression fractures in the lower thoracic spine and upper lumbar spine as shown on recent CT abdomen. Electronically Signed   By: Van Clines M.D.   On: 03/10/2016 12:20   Dg Chest 2 View  Result Date: 02/24/2016 CLINICAL DATA:  Shortness of Breath EXAM: CHEST  2 VIEW COMPARISON:  02/16/2016 FINDINGS: Cardiac shadow is stable. The lungs are well aerated bilaterally with diffuse interstitial changes stable from the prior study. No focal infiltrate is seen. Minimal left effusion is noted. Multiple compression deformities are noted within the thoracic spine. Aortic calcifications are noted. IMPRESSION: Small left-sided pleural effusion.  No focal infiltrate is seen. Electronically Signed   By: Inez Catalina M.D.   On: 02/24/2016 13:07   Ct Abdomen Pelvis W Contrast  Result  Date: 02/25/2016 CLINICAL DATA:  82 y/o  F; general weakness and mild fever. EXAM: CT ABDOMEN AND PELVIS WITH CONTRAST TECHNIQUE: Multidetector CT imaging of the abdomen and pelvis was performed using the standard protocol following bolus administration of intravenous contrast. CONTRAST:  127m ISOVUE-300 IOPAMIDOL (ISOVUE-300) INJECTION 61% COMPARISON:  02/24/2016 abdominal ultrasound. 02/24/2016 chest radiograph. 04/05/2014 lumbar spine radiograph. FINDINGS: Lower chest: Small left pleural effusion. Moderate hiatal hernia. Coronary artery calcifications. Hepatobiliary: No focal liver abnormality. Mild distention of the bladder and common bile duct measuring up to 12 mm. 2 mm gallstone within the lower common bile duct (series 5, image 47). Pancreas: Unremarkable. No pancreatic ductal dilatation or surrounding inflammatory changes. Spleen: Normal in size without focal abnormality. Adrenals/Urinary Tract: Adrenal glands are unremarkable. Kidneys are normal, without renal calculi, focal lesion, or hydronephrosis. Bladder is unremarkable. Stomach/Bowel: Stomach is within normal limits. Appendix appears normal. No evidence of bowel wall thickening, distention, or inflammatory changes. Vascular/Lymphatic: Aortic atherosclerosis. No enlarged abdominal or pelvic lymph nodes. 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 Reproductive: Uterus is unremarkable. 6.8 cm right adnexal cystic lesion. No appreciable enhancing nodular components. Other: No abdominal wall hernia or abnormality. No abdominopelvic ascites. Musculoskeletal: T10 and L5 vertebral body compression deformities and L4 inferior endplate compression deformity stable in comparison with prior lumbar and chest radiographs. Moderate L1 compression deformity is new from prior radiographs. IMPRESSION: 1.  Mild dilatation of common bile duct with 2 mm choledocholithiasis in the lower duct within head of pancreas. Given elevated liver function  tests obstruction is suspected. No pancreatic ductal dilatation. 2. Moderate L1 compression deformity new from prior radiographs, age indeterminate. 3. Small left pleural effusion. 4. Moderate hiatal hernia. 5. Coronary artery calcification. 6. 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 7. **An incidental finding of potential clinical significance has been found. 6.8 cm right adnexal cystic lesion. Further evaluation with pelvic ultrasound is recommended on a nonemergent basis. This recommendation follows ACR consensus guidelines: White Paper of the ACR Incidental Findings Committee II on Adnexal Findings. J Am Coll Radiol [id number].** These results will be called to the ordering clinician or representative by the Radiologist Assistant, and communication documented in the PACS or zVision Dashboard. Electronically Signed   By: Kristine Garbe M.D.   On: 02/25/2016 04:13   Mr 3d Recon At Scanner  Result Date: 02/27/2016 CLINICAL DATA:  Abnormal LFTs EXAM: MRI ABDOMEN WITHOUT AND WITH CONTRAST (INCLUDING MRCP) TECHNIQUE: Multiplanar multisequence MR imaging of the abdomen was performed both before and after the administration of intravenous contrast. Heavily T2-weighted images of the biliary and pancreatic ducts were obtained, and three-dimensional MRCP images were rendered by post processing. CONTRAST:  24m MULTIHANCE GADOBENATE DIMEGLUMINE 529 MG/ML IV SOLN COMPARISON:  CT 02/25/2016, ultrasound 02/24/2016 FINDINGS: Lower chest: Small bilateral pleural effusions left greater than right are visualized. Probable atelectasis at the posterior lung bases and within the lingula. Increased T2 signal in the pericardium is felt secondary to inhomogeneous fat suppression. Hepatobiliary: Signal intensity is within normal limits. No focal hepatic abnormality is visualized. No focal contrast-enhancing lesions. Mild central intra hepatic biliary dilatation. No discrete filling  defects are visualized within the slightly dilated intra hepatic ducts. Extrahepatic common bile duct is dilated up to 9 mm. Best seen on the axial T2 fat suppressed images is a 3 mm hypo intensity within the distal common bile duct at the head of the pancreas, series 4, image number 30. No additional filling defects are visualized along the course of the common bile duct. No focal filling defects within the imaged gallbladder. No surrounding gallbladder edema. No ductal enhancement. Pancreas: No enlargement of the pancreatic duct. No peripancreatic fluid collections. Spleen:  Normal in size.  No focal abnormalities. Adrenals/Urinary Tract: Adrenal glands are within normal limits. Mild perinephric edema, nonspecific. No focal renal lesions are visualized. Stomach/Bowel: Limited evaluation by MRI. Small moderate hiatal hernia. No grossly dilated bowel in the upper abdomen. No bowel wall edema. Vascular/Lymphatic: Imaged aorta within normal limits for size. No abnormally enlarged lymph nodes. Other: Small amount of free fluid around the spleen. Small amount of subcutaneous edema within the lateral abdominal wall and posterior subcutaneous soft tissues. Musculoskeletal: No significant marrow edema. IMPRESSION: 1. Mild central intra hepatic biliary dilatation and mild dilatation of extrahepatic common bile duct, measuring up to 9 mm. Tiny 3 mm hypo intense filling defect within the distal common bile duct at the head of the pancreas, could reflect a small stone. Otherwise no additional hypo intense filling defects, debris, or abnormal ductal enhancement identified. 2. Small bilateral pleural effusions. Small amount of free fluid in the upper abdomen. Electronically Signed   By: KDonavan FoilM.D.   On: 02/27/2016 19:57   Dg Chest Portable 1 View  Result Date: 03/07/2016 CLINICAL DATA:  Hypoxia EXAM: PORTABLE CHEST 1 VIEW COMPARISON:  CXR exams dating back through 09/25/2010 FINDINGS: Pulmonary vascular  redistribution  consistent with mild CHF. No effusion or pneumothorax. No pulmonary consolidation. Heart is top-normal in size. There is aortic arch atherosclerosis as before. Mild prominence of the hila is stable dating back to 2012 and may reflect pulmonary hypertension. The normally hyperinflated appearance of the lungs is less so on current exam. No acute nor suspicious osseous abnormalities. IMPRESSION: Mild CHF.  Aortic atherosclerosis. Electronically Signed   By: Ashley Royalty M.D.   On: 03/07/2016 23:20   Dg Chest Portable 1 View  Result Date: 02/16/2016 CLINICAL DATA:  Left-sided chest pain, bloody sputum with cough. EXAM: PORTABLE CHEST 1 VIEW COMPARISON:  Exams dating back through 12/27/2014 FINDINGS: There is mild diffuse interstitial edema with small left effusion. Atelectatic appearing left lung with volume loss and mediastinal as well as cardiac shift to left with compensatory hyperinflation of the right lung. A double density over the cardiac silhouette cannot exclude a hiatal hernia. Heart is normal in size. There is aortic atherosclerosis. IMPRESSION: Volume loss with shift of the mediastinum and heart to the left. Interstitial pulmonary edema with probable small left pleural effusion. A double density over the cardiac silhouette raises the possibility of a hiatal hernia although atelectatic lung might also contribute to this appearance. Electronically Signed   By: Ashley Royalty M.D.   On: 02/16/2016 19:38   Dg Ercp Biliary & Pancreatic Ducts  Result Date: 02/29/2016 CLINICAL DATA:  ERCP with sphincterotomy for stone removal. EXAM: ERCP TECHNIQUE: Multiple spot images obtained with the fluoroscopic device and submitted for interpretation post-procedure. FLUOROSCOPY TIME:  Fluoroscopy Time:  3 minutes and 44 seconds Radiation Exposure Index (if provided by the fluoroscopic device): 52.53 mGy COMPARISON:  MRI 02/27/2016 FINDINGS: First image demonstrates opacification of the main pancreatic  duct. Cannulation and opacification of the biliary system. Mild dilatation of the biliary system. No large filling defects or stones are identified. IMPRESSION: ERCP images for stone removal. Mild dilatation of the biliary system. These images were submitted for radiologic interpretation only. Please see the procedural report for the amount of contrast and the fluoroscopy time utilized. Electronically Signed   By: Markus Daft M.D.   On: 02/29/2016 13:48   Mr Abdomen Mrcp Moise Boring Contast  Result Date: 02/27/2016 CLINICAL DATA:  Abnormal LFTs EXAM: MRI ABDOMEN WITHOUT AND WITH CONTRAST (INCLUDING MRCP) TECHNIQUE: Multiplanar multisequence MR imaging of the abdomen was performed both before and after the administration of intravenous contrast. Heavily T2-weighted images of the biliary and pancreatic ducts were obtained, and three-dimensional MRCP images were rendered by post processing. CONTRAST:  70m MULTIHANCE GADOBENATE DIMEGLUMINE 529 MG/ML IV SOLN COMPARISON:  CT 02/25/2016, ultrasound 02/24/2016 FINDINGS: Lower chest: Small bilateral pleural effusions left greater than right are visualized. Probable atelectasis at the posterior lung bases and within the lingula. Increased T2 signal in the pericardium is felt secondary to inhomogeneous fat suppression. Hepatobiliary: Signal intensity is within normal limits. No focal hepatic abnormality is visualized. No focal contrast-enhancing lesions. Mild central intra hepatic biliary dilatation. No discrete filling defects are visualized within the slightly dilated intra hepatic ducts. Extrahepatic common bile duct is dilated up to 9 mm. Best seen on the axial T2 fat suppressed images is a 3 mm hypo intensity within the distal common bile duct at the head of the pancreas, series 4, image number 30. No additional filling defects are visualized along the course of the common bile duct. No focal filling defects within the imaged gallbladder. No surrounding gallbladder edema.  No ductal enhancement. Pancreas: No enlargement of the  pancreatic duct. No peripancreatic fluid collections. Spleen:  Normal in size.  No focal abnormalities. Adrenals/Urinary Tract: Adrenal glands are within normal limits. Mild perinephric edema, nonspecific. No focal renal lesions are visualized. Stomach/Bowel: Limited evaluation by MRI. Small moderate hiatal hernia. No grossly dilated bowel in the upper abdomen. No bowel wall edema. Vascular/Lymphatic: Imaged aorta within normal limits for size. No abnormally enlarged lymph nodes. Other: Small amount of free fluid around the spleen. Small amount of subcutaneous edema within the lateral abdominal wall and posterior subcutaneous soft tissues. Musculoskeletal: No significant marrow edema. IMPRESSION: 1. Mild central intra hepatic biliary dilatation and mild dilatation of extrahepatic common bile duct, measuring up to 9 mm. Tiny 3 mm hypo intense filling defect within the distal common bile duct at the head of the pancreas, could reflect a small stone. Otherwise no additional hypo intense filling defects, debris, or abnormal ductal enhancement identified. 2. Small bilateral pleural effusions. Small amount of free fluid in the upper abdomen. Electronically Signed   By: Donavan Foil M.D.   On: 02/27/2016 19:57   US Abdomen Limited Ruq  Result Date: 02/24/2016 CLINICAL DATA:  Postprandial right upper quadrant and upper abdominal pain beginning on 02/23/2016. History of aortic stenosis, chronic kidney disease, and hypertension. EXAM: US ABDOMEN LIMITED - RIGHT UPPER QUADRANT COMPARISON:  None. FINDINGS: Gallbladder: Limited visualization. No gallstones or wall thickening visualized. No sonographic Murphy sign noted by sonographer. Common bile duct: Diameter: Ranges from 3.8 to 8.4 mm. No filling defects demonstrated. Liver: Limited visualization. No focal lesion identified. Liver parenchymal echotexture appears diffusely decreased. Examination is technically  limited, likely due to combination of patient body habitus and labored breathing. IMPRESSION: Technically limited examination with poor penetration of the liver and gallbladder but no gross evidence of cholelithiasis or cholecystitis. Distal bile ducts are at the upper limits of normal range but no significant bile duct dilatation. Electronically Signed   By: Lucienne Capers M.D.   On: 02/24/2016 23:22   Discharge Exam: Vitals:   03/12/16 2216 03/13/16 0626  BP: (!) 141/61 (!) 149/66  Pulse: 63 63  Resp: 18 18  Temp: 98.4 F (36.9 C) 98.4 F (36.9 C)   Vitals:   03/12/16 1802 03/12/16 2216 03/13/16 0626 03/13/16 0809  BP:  (!) 141/61 (!) 149/66   Pulse:  63 63   Resp:  18 18   Temp:  98.4 F (36.9 C) 98.4 F (36.9 C)   TempSrc:  Oral Oral   SpO2: 98% 97% 99% 95%  Weight:   73.5 kg (162 lb 0.6 oz)   Height:        General: Pt is alert, follows commands appropriately, not in acute distress Cardiovascular: Irregular rate and rhythm, no rubs, no gallops Respiratory: Clear to auscultation bilaterally, no wheezing, no crackles, no rhonchi Abdominal: Soft, non tender, non distended, bowel sounds +, no guarding   Discharge Instructions  Discharge Instructions    Diet - low sodium heart healthy    Complete by:  As directed    Increase activity slowly    Complete by:  As directed        Medication List    STOP taking these medications   ipratropium 0.02 % nebulizer solution Commonly known as:  ATROVENT     TAKE these medications   acetaminophen 325 MG tablet Commonly known as:  TYLENOL Take 650 mg by mouth every 6 (six) hours as needed (for pain).   albuterol 108 (90 Base) MCG/ACT inhaler Commonly known as:  PROAIR HFA Inhale 2 puffs into the lungs every 4 (four) hours as needed for wheezing or shortness of breath.   alendronate 70 MG tablet Commonly known as:  FOSAMAX Take 70 mg by mouth every Monday. Take with a full glass of water on an empty stomach.   calcium  carbonate 600 MG Tabs tablet Commonly known as:  OS-CAL Take 2 tablets by mouth daily with breakfast.   clobetasol 0.05 % external solution Commonly known as:  TEMOVATE apply 4 drops to scalp one daily Monday- Thursday for psoriasis   esomeprazole 20 MG capsule Commonly known as:  NEXIUM Take 20 mg by mouth daily at 12 noon.   feeding supplement Liqd Take 1 Container by mouth 2 (two) times daily between meals.   fluticasone 50 MCG/ACT nasal spray Commonly known as:  FLONASE Place 1 spray into both nostrils daily.   levalbuterol 0.63 MG/3ML nebulizer solution Commonly known as:  XOPENEX Take 3 mLs (0.63 mg total) by nebulization every 6 (six) hours as needed for wheezing or shortness of breath.   levofloxacin 750 MG tablet Commonly known as:  LEVAQUIN Take 1 tablet (750 mg total) by mouth every other day. Start taking on:  03/14/2016   MULTIVITAMIN/IRON PO Take 1 tablet by mouth daily.   OXYGEN Inhale 2-4 L into the lungs continuous. To keep O2 stats >90%   predniSONE 10 MG tablet Commonly known as:  DELTASONE Take 30 mg tablet in am 12/13 and taper down by 10 mg daily until completed   rivaroxaban 20 MG Tabs tablet Commonly known as:  XARELTO Take 1 tablet (20 mg total) by mouth daily. Resume in 1 week, 03/09/2016.   senna-docusate 8.6-50 MG tablet Commonly known as:  Senokot-S Take 1 tablet by mouth at bedtime as needed for mild constipation.   SPIRIVA RESPIMAT 2.5 MCG/ACT Aers Generic drug:  Tiotropium Bromide Monohydrate Inhale 2 puffs into the lungs daily.   Vitamin D 2000 units Caps Take 2,000 Units by mouth daily with breakfast.      Follow-up Information    GATES,ROBERT NEVILL, MD Follow up.   Specialty:  Internal Medicine Contact information: [address] [place] [address] [phone]            The results of significant diagnostics from this hospitalization (including imaging, microbiology, ancillary and laboratory)  are listed below for reference.     Microbiology: Recent Results (from the past 240 hour(s))  Culture, blood (Routine x 2)     Status: None (Preliminary result)   Collection Time: 03/07/16 11:04 PM  Result Value Ref Range Status   Specimen Description BLOOD RIGHT ARM  Final   Special Requests AEROBIC BOTTLE ONLY 5ML  Final   Culture NO GROWTH 4 DAYS  Final   Report Status PENDING  Incomplete  Culture, blood (Routine x 2)     Status: None (Preliminary result)   Collection Time: 03/07/16 11:22 PM  Result Value Ref Range Status   Specimen Description BLOOD LEFT ARM  Final   Special Requests IN PEDIATRIC BOTTLE 2ML  Final   Culture NO GROWTH 4 DAYS  Final   Report Status PENDING  Incomplete  Urine culture     Status: None   Collection Time: 03/08/16  4:31 AM  Result Value Ref Range Status   Specimen Description URINE, CATHETERIZED  Final   Special Requests NONE  Final   Culture NO GROWTH  Final   Report Status 03/09/2016 FINAL  Final  Labs: Basic Metabolic Panel:  Recent Labs Lab 03/09/16 0224 03/10/16 0649 03/11/16 0212 03/12/16 0438 03/13/16 0233  NA 143 140 140 141 141  K 4.5 4.1 4.6 3.7 3.9  CL 105 103 102 97* 93*  CO2 _0 33* 41*  GLUCOSE 148* 123* 137* 96 101*  BUN _1 22* 22*  CREATININE 0.94 0.97 1.01* 0.96 1.07*  CALCIUM 8.0* 8.3* 8.1* 8.2* 8.5*  MG 1.7 1.9 1.8 1.9 1.9   Liver Function Tests:  Recent Labs Lab 03/07/16 2304  AST 25  ALT 26  ALKPHOS 114  BILITOT 0.4  PROT 6.8  ALBUMIN 2.9*   CBC:  Recent Labs Lab 03/09/16 0224 03/10/16 0649 03/11/16 0534 03/12/16 0438 03/13/16 0233  WBC 15.4* 14.1* 10.2 8.9 8.0  NEUTROABS 14.2* 12.3* 8.5* 6.6 6.6  HGB 6.7* 9.8* 9.8* 10.4* 11.0*  HCT 22.6* 32.6* 32.0* 34.1* 36.1  MCV 88.6 86.7 88.2 87.9 88.3  PLT 318 371 352 343 338   Cardiac Enzymes:  Recent Labs Lab 03/07/16 2349  TROPONINI 0.09*   BNP: BNP (last 3 results)  Recent Labs  02/16/16 1850 03/07/16 2349  BNP 109.8*  200.3*    SIGNED: Time coordinating discharge: 30 minutes  Faye Ramsay, MD  [facility] 03/13/2016, 9:53 AM Pager [phone]  If 7PM-7AM, please contact night-coverage [email or web address] Password [email or web address]

## 2016-03-13 NOTE — Discharge Instructions (Signed)
Shortness of Breath  Shortness of breath means you have trouble breathing. Shortness of breath needs medical care right away.  HOME CARE   ? Do not smoke.  ? Avoid being around chemicals or things (paint fumes, dust) that may bother your breathing.  ? Rest as needed. Slowly begin your normal activities.  ? Only take medicines as told by your doctor.  ? Keep all doctor visits as told.  GET HELP RIGHT AWAY IF:   ? Your shortness of breath gets worse.  ? You feel lightheaded, pass out (faint), or have a cough that is not helped by medicine.  ? You cough up blood.  ? You have pain with breathing.  ? You have pain in your chest, arms, shoulders, or belly (abdomen).  ? You have a fever.  ? You cannot walk up stairs or exercise the way you normally do.  ? You do not get better in the time expected.  ? You have a hard time doing normal activities even with rest.  ? You have problems with your medicines.  ? You have any new symptoms.  MAKE SURE YOU:  ? Understand these instructions.  ? Will watch your condition.  ? Will get help right away if you are not doing well or get worse.  This information is not intended to replace advice given to you by your health care provider. Make sure you discuss any questions you have with your health care provider.  Document Released: 09/05/2007 Document Revised: 03/24/2013 Document Reviewed: 06/04/2011  Elsevier Interactive Patient Education ? 2017 Elsevier Inc.

## 2016-03-13 NOTE — Progress Notes (Signed)
Clinical Social Worker facilitated patient discharge including contacting patient family and facility to confirm patient discharge plans.  Clinical information faxed to facility and family agreeable with plan.  CSW arranged ambulance transport via [facility] to [facility].Patient will be in room 306 at SNF facility. RN Katharine Look to call report at [phone] prior to discharge.  Clinical Social Worker will sign off for now as social work intervention is no longer needed. Please consult [place] again if new need arises.  Rhea Pink, MSW, [place]

## 2016-03-13 NOTE — Care Management Note (Signed)
Case Management Note Marvetta Gibbons RN, BSN Unit 2W-Case Manager [phone]  Patient Details  Name: Paula Contreras MRN: [id number] Date of Birth: 1933/04/12  Subjective/Objective:   Pt admitted with sepsis                 Action/Plan: PTA pt was at SNF- [facility]- CSW to follow for return to SNF when medically stable.   Expected Discharge Date:     03/13/16             Expected Discharge Plan:  [place]  In-House Referral:  Clinical Social Work  Discharge planning Services  CM Consult  Post Acute Care Choice:    Choice offered to:     DME Arranged:    DME Agency:     HH Arranged:    [place] Agency:     Status of Service:  Completed, signed off  If discussed at [employer] of Stay Meetings, dates discussed:  12/12  Discharge Disposition: skilled facility   Additional Comments:  Dawayne Patricia, RN 03/13/2016, 12:00 PM

## 2016-11-29 ENCOUNTER — Emergency Department (HOSPITAL_COMMUNITY): Payer: Medicare Other

## 2016-11-29 ENCOUNTER — Encounter (HOSPITAL_COMMUNITY): Payer: Self-pay | Admitting: *Deleted

## 2016-11-29 ENCOUNTER — Inpatient Hospital Stay (HOSPITAL_COMMUNITY)
Admission: EM | Admit: 2016-11-29 | Discharge: 2016-12-07 | DRG: 180 | Disposition: A | Discharge status: Nursing Home (Includes ICF) | Payer: Medicare Other | Attending: Internal Medicine | Admitting: Internal Medicine

## 2016-11-29 DIAGNOSIS — E440 Moderate protein-calorie malnutrition: Secondary | ICD-10-CM | POA: Diagnosis present

## 2016-11-29 DIAGNOSIS — Z85828 Personal history of other malignant neoplasm of skin: Secondary | ICD-10-CM | POA: Diagnosis not present

## 2016-11-29 DIAGNOSIS — Z833 Family history of diabetes mellitus: Secondary | ICD-10-CM | POA: Diagnosis not present

## 2016-11-29 DIAGNOSIS — J42 Unspecified chronic bronchitis: Secondary | ICD-10-CM | POA: Diagnosis present

## 2016-11-29 DIAGNOSIS — J9601 Acute respiratory failure with hypoxia: Secondary | ICD-10-CM | POA: Diagnosis not present

## 2016-11-29 DIAGNOSIS — N183 Chronic kidney disease, stage 3 unspecified: Secondary | ICD-10-CM | POA: Diagnosis present

## 2016-11-29 DIAGNOSIS — Z7951 Long term (current) use of inhaled steroids: Secondary | ICD-10-CM | POA: Diagnosis not present

## 2016-11-29 DIAGNOSIS — J9621 Acute and chronic respiratory failure with hypoxia: Secondary | ICD-10-CM | POA: Diagnosis not present

## 2016-11-29 DIAGNOSIS — Z9981 Dependence on supplemental oxygen: Secondary | ICD-10-CM | POA: Diagnosis not present

## 2016-11-29 DIAGNOSIS — J441 Chronic obstructive pulmonary disease with (acute) exacerbation: Secondary | ICD-10-CM | POA: Diagnosis present

## 2016-11-29 DIAGNOSIS — M810 Age-related osteoporosis without current pathological fracture: Secondary | ICD-10-CM | POA: Diagnosis present

## 2016-11-29 DIAGNOSIS — F0390 Unspecified dementia without behavioral disturbance: Secondary | ICD-10-CM | POA: Diagnosis present

## 2016-11-29 DIAGNOSIS — D649 Anemia, unspecified: Secondary | ICD-10-CM | POA: Diagnosis present

## 2016-11-29 DIAGNOSIS — J449 Chronic obstructive pulmonary disease, unspecified: Secondary | ICD-10-CM | POA: Diagnosis present

## 2016-11-29 DIAGNOSIS — I129 Hypertensive chronic kidney disease with stage 1 through stage 4 chronic kidney disease, or unspecified chronic kidney disease: Secondary | ICD-10-CM | POA: Diagnosis present

## 2016-11-29 DIAGNOSIS — B9562 Methicillin resistant Staphylococcus aureus infection as the cause of diseases classified elsewhere: Secondary | ICD-10-CM | POA: Diagnosis present

## 2016-11-29 DIAGNOSIS — H9190 Unspecified hearing loss, unspecified ear: Secondary | ICD-10-CM | POA: Diagnosis present

## 2016-11-29 DIAGNOSIS — I480 Paroxysmal atrial fibrillation: Secondary | ICD-10-CM | POA: Diagnosis present

## 2016-11-29 DIAGNOSIS — I350 Nonrheumatic aortic (valve) stenosis: Secondary | ICD-10-CM | POA: Diagnosis present

## 2016-11-29 DIAGNOSIS — Z7901 Long term (current) use of anticoagulants: Secondary | ICD-10-CM | POA: Diagnosis not present

## 2016-11-29 DIAGNOSIS — R042 Hemoptysis: Secondary | ICD-10-CM | POA: Diagnosis not present

## 2016-11-29 DIAGNOSIS — C3402 Malignant neoplasm of left main bronchus: Principal | ICD-10-CM | POA: Diagnosis present

## 2016-11-29 DIAGNOSIS — Z66 Do not resuscitate: Secondary | ICD-10-CM | POA: Diagnosis present

## 2016-11-29 DIAGNOSIS — Z515 Encounter for palliative care: Secondary | ICD-10-CM

## 2016-11-29 DIAGNOSIS — C3492 Malignant neoplasm of unspecified part of left bronchus or lung: Secondary | ICD-10-CM

## 2016-11-29 DIAGNOSIS — R918 Other nonspecific abnormal finding of lung field: Secondary | ICD-10-CM | POA: Diagnosis not present

## 2016-11-29 DIAGNOSIS — Z801 Family history of malignant neoplasm of trachea, bronchus and lung: Secondary | ICD-10-CM

## 2016-11-29 DIAGNOSIS — Z87891 Personal history of nicotine dependence: Secondary | ICD-10-CM | POA: Diagnosis not present

## 2016-11-29 DIAGNOSIS — J9600 Acute respiratory failure, unspecified whether with hypoxia or hypercapnia: Secondary | ICD-10-CM

## 2016-11-29 DIAGNOSIS — J189 Pneumonia, unspecified organism: Secondary | ICD-10-CM | POA: Diagnosis not present

## 2016-11-29 DIAGNOSIS — J9811 Atelectasis: Secondary | ICD-10-CM | POA: Diagnosis not present

## 2016-11-29 DIAGNOSIS — I519 Heart disease, unspecified: Secondary | ICD-10-CM | POA: Diagnosis not present

## 2016-11-29 DIAGNOSIS — I5189 Other ill-defined heart diseases: Secondary | ICD-10-CM | POA: Diagnosis present

## 2016-11-29 DIAGNOSIS — C3412 Malignant neoplasm of upper lobe, left bronchus or lung: Secondary | ICD-10-CM | POA: Diagnosis not present

## 2016-11-29 LAB — COMPREHENSIVE METABOLIC PANEL
ALBUMIN: 3 g/dL — AB (ref 3.5–5.0)
ALK PHOS: 60 U/L (ref 38–126)
ALT: 10 U/L — AB (ref 14–54)
AST: 17 U/L (ref 15–41)
Anion gap: 6 (ref 5–15)
BUN: 17 mg/dL (ref 6–20)
CALCIUM: 10.7 mg/dL — AB (ref 8.9–10.3)
CO2: 34 mmol/L — AB (ref 22–32)
CREATININE: 1.15 mg/dL — AB (ref 0.44–1.00)
Chloride: 98 mmol/L — ABNORMAL LOW (ref 101–111)
GFR calc Af Amer: 50 mL/min — ABNORMAL LOW (ref >60)
GFR calc non Af Amer: 43 mL/min — ABNORMAL LOW (ref >60)
GLUCOSE: 97 mg/dL (ref 65–99)
Potassium: 4.3 mmol/L (ref 3.5–5.1)
SODIUM: 138 mmol/L (ref 135–145)
Total Bilirubin: 0.3 mg/dL (ref 0.3–1.2)
Total Protein: 7.1 g/dL (ref 6.5–8.1)

## 2016-11-29 LAB — CBC WITH DIFFERENTIAL/PLATELET
BASOS PCT: 1 %
Basophils Absolute: 0 10*3/uL (ref 0.0–0.1)
EOS ABS: 0.1 10*3/uL (ref 0.0–0.7)
Eosinophils Relative: 1 %
HEMATOCRIT: 28.4 % — AB (ref 36.0–46.0)
Hemoglobin: 8.4 g/dL — ABNORMAL LOW (ref 12.0–15.0)
Lymphocytes Relative: 9 %
Lymphs Abs: 0.8 10*3/uL (ref 0.7–4.0)
MCH: 24.2 pg — AB (ref 26.0–34.0)
MCHC: 29.6 g/dL — AB (ref 30.0–36.0)
MCV: 81.8 fL (ref 78.0–100.0)
MONO ABS: 0.7 10*3/uL (ref 0.1–1.0)
MONOS PCT: 8 %
NEUTROS ABS: 6.9 10*3/uL (ref 1.7–7.7)
Neutrophils Relative %: 81 %
Platelets: 380 10*3/uL (ref 150–400)
RBC: 3.47 MIL/uL — ABNORMAL LOW (ref 3.87–5.11)
RDW: 15.4 % (ref 11.5–15.5)
WBC: 8.5 10*3/uL (ref 4.0–10.5)

## 2016-11-29 LAB — TYPE AND SCREEN
ABO/RH(D): A POS
Antibody Screen: NEGATIVE

## 2016-11-29 LAB — I-STAT TROPONIN, ED: TROPONIN I, POC: 0.02 ng/mL (ref 0.00–0.08)

## 2016-11-29 LAB — PROTIME-INR
INR: 1.93
Prothrombin Time: 21.9 seconds — ABNORMAL HIGH (ref 11.4–15.2)

## 2016-11-29 LAB — PROCALCITONIN

## 2016-11-29 MED ORDER — LEVALBUTEROL HCL 0.63 MG/3ML IN NEBU
0.6300 mg | INHALATION_SOLUTION | Freq: Four times a day (QID) | RESPIRATORY_TRACT | Status: DC
  Administered 2016-11-30 (×3): 0.63 mg via RESPIRATORY_TRACT
  Filled 2016-11-29 (×2): qty 3

## 2016-11-29 MED ORDER — VANCOMYCIN HCL 500 MG IV SOLR
500.0000 mg | Freq: Two times a day (BID) | INTRAVENOUS | Status: DC
  Administered 2016-11-30 – 2016-12-01 (×4): 500 mg via INTRAVENOUS
  Filled 2016-11-29 (×6): qty 500

## 2016-11-29 MED ORDER — BENZONATATE 100 MG PO CAPS
100.0000 mg | ORAL_CAPSULE | Freq: Three times a day (TID) | ORAL | Status: DC | PRN
  Filled 2016-11-29: qty 1

## 2016-11-29 MED ORDER — ONDANSETRON HCL 4 MG/2ML IJ SOLN: 4.0000 mg | Freq: Four times a day (QID) | INTRAMUSCULAR | Status: DC | PRN

## 2016-11-29 MED ORDER — LEVALBUTEROL HCL 0.63 MG/3ML IN NEBU: 0.6300 mg | INHALATION_SOLUTION | Freq: Four times a day (QID) | RESPIRATORY_TRACT | Status: DC | PRN

## 2016-11-29 MED ORDER — FLUTICASONE PROPIONATE 50 MCG/ACT NA SUSP
1.0000 | Freq: Every day | NASAL | Status: DC
  Administered 2016-11-30 – 2016-12-07 (×8): 1 via NASAL
  Filled 2016-11-29: qty 16

## 2016-11-29 MED ORDER — VANCOMYCIN HCL IN DEXTROSE 1-5 GM/200ML-% IV SOLN
1000.0000 mg | Freq: Once | INTRAVENOUS | Status: AC
  Administered 2016-11-29: 1000 mg via INTRAVENOUS
  Filled 2016-11-29: qty 200

## 2016-11-29 MED ORDER — ACETAMINOPHEN 325 MG PO TABS
650.0000 mg | ORAL_TABLET | Freq: Four times a day (QID) | ORAL | Status: DC | PRN
  Filled 2016-11-29: qty 2

## 2016-11-29 MED ORDER — IOPAMIDOL (ISOVUE-300) INJECTION 61%
INTRAVENOUS | Status: AC
  Filled 2016-11-29: qty 75

## 2016-11-29 MED ORDER — ACETAMINOPHEN 650 MG RE SUPP: 650.0000 mg | Freq: Four times a day (QID) | RECTAL | Status: DC | PRN

## 2016-11-29 MED ORDER — VANCOMYCIN HCL 500 MG IV SOLR
500.0000 mg | Freq: Two times a day (BID) | INTRAVENOUS | Status: DC
  Filled 2016-11-29 (×2): qty 500

## 2016-11-29 MED ORDER — IOPAMIDOL (ISOVUE-300) INJECTION 61%
INTRAVENOUS | Status: AC
  Administered 2016-11-29: 75 mL
  Filled 2016-11-29: qty 75

## 2016-11-29 MED ORDER — DEXTROSE 5 % IV SOLN
2.0000 g | INTRAVENOUS | Status: DC
  Administered 2016-11-30 – 2016-12-03 (×4): 2 g via INTRAVENOUS
  Filled 2016-11-29 (×6): qty 2

## 2016-11-29 MED ORDER — TIOTROPIUM BROMIDE MONOHYDRATE 18 MCG IN CAPS
18.0000 ug | ORAL_CAPSULE | Freq: Every day | RESPIRATORY_TRACT | Status: DC
  Administered 2016-11-30 – 2016-12-07 (×7): 18 ug via RESPIRATORY_TRACT
  Filled 2016-11-29 (×2): qty 5

## 2016-11-29 MED ORDER — ONDANSETRON HCL 4 MG PO TABS: 4.0000 mg | ORAL_TABLET | Freq: Four times a day (QID) | ORAL | Status: DC | PRN

## 2016-11-29 MED ORDER — DEXTROSE 5 % IV SOLN
2.0000 g | Freq: Once | INTRAVENOUS | Status: AC
  Administered 2016-11-29: 2 g via INTRAVENOUS
  Filled 2016-11-29: qty 2

## 2016-11-29 NOTE — ED Provider Notes (Signed)
Emergency Department Provider Note   I have reviewed the triage vital signs and the nursing notes.   HISTORY  Chief Complaint Hemoptysis   HPI Paula Contreras is a 83 y.o. female with PMH of AS, COPD, CKD, HTN, and pAfib presents to the emergency department for evaluation of large volume hemoptysis. Patient states that starting this morning she began coughing and produced large blood clots. She denies any difficulty breathing. No fevers or chills. She has been compliant with her Xarelto. No abdominal pain. No nausea or vomiting. No radiation of symptoms.   Past Medical History:  Diagnosis Date  . Anemia 02/20/2015  . Aortic stenosis    a. 2D Echo 07/2013: EF 60-65%, no RWMA, grade 1 DD, normal LV filling pressure, moderate AS, mild TR, PASP 77mmHg.  . Bradycardia    a. possible bradycardia with HR 44 by physical exam notes at nephrologist office in 03/2014. F/u event monitor showed NSR PACs average HR 81, bradycardia <1% of readable data, no pauses of 3 seconds or longer..  . Cataract   . Chronic respiratory failure ([place])    a. On home O2 since 2012.  . CKD (chronic kidney disease), stage III   . COPD (chronic obstructive pulmonary disease) ([place])   . Dementia   . Hypertension   . Osteoporosis   . Other psoriasis and similar disorders    psoriasis vs eczema  . Paroxysmal atrial fibrillation ([place])    a. Dx 07/2013. No attempts per Epic to restore NSR but in NSR 2016.  Marland Kitchen Pneumonia 03/2016  . Skin cancer     Patient Active Problem List   Diagnosis Date Noted  . Acute respiratory failure ([place]) 11/29/2016  . Pressure injury of skin 03/10/2016  . Sepsis ([place]) 03/08/2016  . CAP (community acquired pneumonia) 03/08/2016  . Sepsis due to pneumonia ([place])   . COPD exacerbation ([place])   . Acute on chronic respiratory failure with hypoxia ([place])   . Paroxysmal atrial fibrillation ([facility])   . Diastolic dysfunction   . Abnormal LFTs   . Bile duct stone 02/25/2016  . Chronic  anticoagulation - Xerelto 02/25/2016  . Ascending cholangitis due to choledocolithiasis 02/25/2016  . HCAP (healthcare-associated pneumonia) 02/24/2016  . LFTs abnormal 02/24/2016  . Epigastric pain 02/24/2016  . Nausea and vomiting 02/24/2016  . History of bradycardia 10/27/2015  . SOB (shortness of breath)   . Encounter for palliative care   . Hypernatremia 02/21/2015  . Altered mental status 02/20/2015  . Metabolic encephalopathy 67/61/9509  . Weakness generalized 02/20/2015  . Dehydration 02/20/2015  . Leukocytosis 02/20/2015  . Anemia 02/20/2015  . Bronchitis 02/20/2015  . Fall at nursing home 02/20/2015  . Bradycardia 04/09/2014  . Chronic respiratory failure ([place])   . Aortic stenosis   . PAF (paroxysmal atrial fibrillation) ([place])   . CKD (chronic kidney disease), stage III   . Aortic valve disorder 10/30/2013  . Undiagnosed cardiac murmurs 07/21/2013  . Hypertension 10/02/2010  . COPD (chronic obstructive pulmonary disease) ([place]) 10/02/2010    Past Surgical History:  Procedure Laterality Date  . BREAST SURGERY Right 1964   tumor removed  . CATARACT EXTRACTION Bilateral   . ERCP N/A 02/29/2016   Procedure: ENDOSCOPIC RETROGRADE CHOLANGIOPANCREATOGRAPHY (ERCP);  Surgeon: Irene Shipper, MD;  Location: [facility];  Service: Endoscopy;  Laterality: N/A;  . HIP SURGERY Right    fracture    Current Outpatient Rx  . Order #: [id number] Class: Print  . Order #: [id number] Class: Historical Med  .  Order #: [id number] Class: Historical Med  . Order #: [id number] Class: Historical Med  . Order #: [id number] Class: Historical Med  . Order #: [id number] Class: Historical Med  . Order #: [id number] Class: No Print  . Order #: [id number] Class: No Print  . Order #: [id number] Class: Historical Med  . Order #: [id number] Class: Historical Med  . Order #: [id number] Class: Print  . Order #: [id number] Class: No Print  . Order #: [id number] Class: Historical Med  . Order #: [id number] Class: Historical  Med    Allergies Codeine and Robitussin (alcohol free) [guaifenesin]  Family History  Problem Relation Age of Onset  . Asthma Mother   . Kidney disease Mother   . Lung cancer Father   . Asthma Maternal Grandmother   . Lung cancer Paternal Uncle        father's twin brother  . Diabetes Brother     Social History Social History  Substance Use Topics  . Smoking status: Former Smoker    Packs/day: 1.00    Years: 55.00    Types: Cigarettes    Quit date: 09/25/2010  . Smokeless tobacco: Never Used  . Alcohol use No    Review of Systems  Constitutional: No fever/chills Eyes: No visual changes. ENT: No sore throat. Cardiovascular: Denies chest pain. Positive hemoptysis.  Respiratory: Denies shortness of breath. Gastrointestinal: No abdominal pain.  No nausea, no vomiting.  No diarrhea.  No constipation. Genitourinary: Negative for dysuria. Musculoskeletal: Negative for back pain. Skin: Negative for rash. Neurological: Negative for headaches, focal weakness or numbness.  10-point ROS otherwise negative.  ____________________________________________   PHYSICAL EXAM:  VITAL SIGNS: ED Triage Vitals [11/29/16 1428]  Enc Vitals Group     BP 121/62     Pulse Rate (!) 102     Resp 16     Temp 98.9 F (37.2 C)     Temp Source Oral     SpO2 95 %   Constitutional: Alert and oriented. Well appearing and in no acute distress. Eyes: Conjunctivae are normal.  Head: Atraumatic. Nose: No congestion/rhinnorhea. Mouth/Throat: Mucous membranes are moist.  Oropharynx non-erythematous. Neck: No stridor.   Cardiovascular: Normal rate, regular rhythm. Good peripheral circulation. Grossly normal heart sounds.   Respiratory: Normal respiratory effort.  No retractions. Lung sounds decreased on the left.  Gastrointestinal: Soft and nontender. No distention.  Musculoskeletal: No lower extremity tenderness nor edema. No gross deformities of extremities. Neurologic:  Normal speech and  language. No gross focal neurologic deficits are appreciated.  Skin:  Skin is warm, dry and intact. No rash noted.  ____________________________________________   LABS (all labs ordered are listed, but only abnormal results are displayed)  Labs Reviewed  COMPREHENSIVE METABOLIC PANEL - Abnormal; Notable for the following:       Result Value   Chloride 98 (*)    CO2 34 (*)    Creatinine, Ser 1.15 (*)    Calcium 10.7 (*)    Albumin 3.0 (*)    ALT 10 (*)    GFR calc non Af Amer 43 (*)    GFR calc Af Amer 50 (*)    All other components within normal limits  CBC WITH DIFFERENTIAL/PLATELET - Abnormal; Notable for the following:    RBC 3.47 (*)    Hemoglobin 8.4 (*)    HCT 28.4 (*)    MCH 24.2 (*)    MCHC 29.6 (*)    All other components within normal limits  PROTIME-INR - Abnormal; Notable for the following:    Prothrombin Time 21.9 (*)  All other components within normal limits  CULTURE, EXPECTORATED SPUTUM-ASSESSMENT  PROCALCITONIN  BASIC METABOLIC PANEL  CBC  I-STAT TROPONIN, ED  TYPE AND SCREEN   ____________________________________________  EKG   EKG Interpretation  Date/Time:  Thursday November 29 2016 15:33:43 EDT Ventricular Rate:  94 PR Interval:    QRS Duration: 86 QT Interval:  348 QTC Calculation: 436 R Axis:   127 Text Interpretation:  Right and left arm electrode reversal, interpretation assumes no reversal Sinus rhythm Right axis deviation Borderline low voltage, extremity leads Nonspecific T abnormalities, lateral leads No STEMI.  Confirmed by Nanda Quinton [phone]) on 11/29/2016 3:37:12 PM       ____________________________________________  RADIOLOGY  Dg Chest 2 View  Result Date: 11/29/2016 CLINICAL DATA:  Hemoptysis.  History of pneumonia. EXAM: CHEST  2 VIEW COMPARISON:  03/10/2016 FINDINGS: The cardiomediastinal silhouette is obscured by a large left pleural effusion. There is severe leftward mediastinal shift. Calcific atherosclerotic disease  of the aorta noted. The right lung is clear.  The left lung is not visualized. Compression deformity of T10 and L1 vertebral bodies, stable from prior CT dated November 2017. Soft tissues are grossly normal. IMPRESSION: Severe leftward mediastinal shift with deviation of the carina to the midline of the left hemithorax. Large left pleural effusion. Left lung is not visualized. Possible left lung collapse. Clear right lung. Further evaluation with chest CT with contrast is recommended to establish the reason for severe leftward mediastinal shift. These results were called by telephone at the time of interpretation on 11/29/2016 at 4:38 pm to Dr. Nanda Quinton , who verbally acknowledged these results. Electronically Signed   By: Fidela Salisbury M.D.   On: 11/29/2016 16:39   Ct Chest W Contrast  Result Date: 11/29/2016 CLINICAL DATA:  Abnormal chest radiograph with LEFT lung collapse and mediastinal shift to the LEFT EXAM: CT CHEST WITH CONTRAST TECHNIQUE: Multidetector CT imaging of the chest was performed during intravenous contrast administration. Sagittal and coronal MPR images reconstructed from axial data set. CONTRAST:  65mL ISOVUE-300 IOPAMIDOL (ISOVUE-300) INJECTION 61% IV. COMPARISON:  Chest radiograph 11/29/2016 FINDINGS: Cardiovascular: Atherosclerotic calcifications aorta, proximal great vessels, and coronary arteries. Thoracic aorta normal caliber. Thoracic vascular structures grossly patent on nondedicated exam. Minimal pericardial effusion. Heart size normal. Mediastinum/Nodes: Mediastinal shift to the LEFT. Moderate-sized hiatal hernia. Question wall thickening of distal esophagus and gastroesophageal junction, versus artifact from incomplete distention. Scattered normal size mediastinal lymph nodes. No thoracic adenopathy. Base of cervical region unremarkable. Lungs/Pleura: Small LEFT pleural effusion. Subtotal atelectasis of LEFT lung with fluid filling the LEFT mainstem and LEFT lower lobe  bronchi as well as more peripheral portions of LEFT upper and LEFT lower lobe bronchi. Minimal residual aeration of LEFT upper lobe. No discrete mass or obstructing lesion is delineated. Underlying emphysematous changes of the RIGHT lung with central peribronchial thickening but no definite infiltrate. No RIGHT pleural effusion or mass. Upper Abdomen: Pneumobilia consistent with history of prior ERCP. Remaining visualized upper abdomen unremarkable. Musculoskeletal: Diffuse osseous demineralization. Marked compression deformities of T10 and L1 vertebral bodies. Old healed BILATERAL rib fractures. IMPRESSION: Subtotal atelectasis of LEFT lung with extensive fluid/mucous throughout LEFT mainstem bronchus, LEFT lower lobe bronchus and extending into subsegmental bronchi in both the upper and lower lobes. A discrete obstructing mass is not visualized but is not completely excluded ; consider follow-up proctoscopy. Associated small LEFT pleural effusion. Underlying COPD changes RIGHT lung. Extensive atherosclerotic disease including coronary artery calcifications. Small hiatal hernia. Aortic Atherosclerosis (ICD10-I70.0) and Emphysema (ICD10-J43.9).  Electronically Signed   By: Lavonia Dana M.D.   On: 11/29/2016 18:51    ____________________________________________   PROCEDURES  Procedure(s) performed:   Procedures  CRITICAL CARE Performed by: Margette Fast Total critical care time: 45 minutes Critical care time was exclusive of separately billable procedures and treating other patients. Critical care was necessary to treat or prevent imminent or life-threatening deterioration. Critical care was time spent personally by me on the following activities: development of treatment plan with patient and/or surrogate as well as nursing, discussions with consultants, evaluation of patient's response to treatment, examination of patient, obtaining history from patient or surrogate, ordering and performing  treatments and interventions, ordering and review of laboratory studies, ordering and review of radiographic studies, pulse oximetry and re-evaluation of patient's condition.  Nanda Quinton, MD Emergency Medicine  ____________________________________________   INITIAL IMPRESSION / ASSESSMENT AND PLAN / ED COURSE  Pertinent labs & imaging results that were available during my care of the patient were reviewed by me and considered in my medical decision making (see chart for details).  Patient presents to the emergency department for evaluation of hemoptysis. Patient on Xarelto. No hypoxemia or active hemoptysis on my exam.   04:40 PM Called by radiology to discuss CXR findings. Large pleural effusion and opacity of the left hemithorax. Will order CT of the chest with contrast for further evaluation. Patient is awake, alert, and without pain/SOB at this time. No complaints. Updated on CXR findings and need for CT.   Spoke with Critical Care who will see the patient in consultation. No O2 requirement, immediate airway issues, or distress. Will ask Hospitalist to admit to SDU.   Discussed patient's case with Hospitlaist, Dr. Hal Hope to request admission. Patient and family (if present) updated with plan. Care transferred to Hospitalist service.  I reviewed all nursing notes, vitals, pertinent old records, EKGs, labs, imaging (as available).  ____________________________________________  FINAL CLINICAL IMPRESSION(S) / ED DIAGNOSES  Final diagnoses:  Hemoptysis     MEDICATIONS GIVEN DURING THIS VISIT:  Medications  iopamidol (ISOVUE-300) 61 % injection (not administered)  ceFEPIme (MAXIPIME) 2 g in dextrose 5 % 50 mL IVPB (not administered)  vancomycin (VANCOCIN) 500 mg in sodium chloride 0.9 % 100 mL IVPB (not administered)  fluticasone (FLONASE) 50 MCG/ACT nasal spray 1 spray (not administered)  levalbuterol (XOPENEX) nebulizer solution 0.63 mg (not administered)  Tiotropium  Bromide Monohydrate AERS 2 puff (not administered)  acetaminophen (TYLENOL) tablet 650 mg (not administered)    Or  acetaminophen (TYLENOL) suppository 650 mg (not administered)  ondansetron (ZOFRAN) tablet 4 mg (not administered)    Or  ondansetron (ZOFRAN) injection 4 mg (not administered)  levalbuterol (XOPENEX) nebulizer solution 0.63 mg (not administered)  benzonatate (TESSALON) capsule 100 mg (not administered)  iopamidol (ISOVUE-300) 61 % injection (75 mLs  Contrast Given 11/29/16 1825)  ceFEPIme (MAXIPIME) 2 g in dextrose 5 % 50 mL IVPB (0 g Intravenous Stopped 11/29/16 1935)  vancomycin (VANCOCIN) IVPB 1000 mg/200 mL premix (0 mg Intravenous Stopped 11/29/16 2048)     NEW OUTPATIENT MEDICATIONS STARTED DURING THIS VISIT:  None   Note:  This document was prepared using Dragon voice recognition software and may include unintentional dictation errors.  Nanda Quinton, MD Emergency Medicine   Ian Castagna, [facility], MD 11/29/16 2300

## 2016-11-29 NOTE — ED Triage Notes (Signed)
Patient presents to ed via [facility]  Patient is a patient at [facility] assisted living, states she was coughing up blood this am , per ems there was a lot of blood in her small trash can. Patient states she hasn't ever done this before. C/o of pain only when coughing. Denies being sick, Patient is alert oriented, states she is able to walk with a walker only  In her room and her meals are brought to her. Patient wanted her sister in law notified. [name] [phone]. I called and spoke with her.

## 2016-11-29 NOTE — H&P (Signed)
History and Physical    Paula Contreras JYN:[id number] DOB: 1934/02/03 DOA: 11/29/2016  PCP: Josetta Huddle, MD  Patient coming from: Home.  Chief Complaint: Hemoptysis.  HPI: Paula Contreras is a 83 y.o. female with history of COPD and paroxysmal atrial fibrillation presents to the ER with complaints of hemoptysis. Patient states that she has been coughing up blood since morning. Denies any chest pain shortness of breath fever chills.   ED Course: In the ER patient is found to be hypoxic and CT scan of the chest and shows subtotal of x-rays of the left lung with fluid and mucus in the left mainstem bronchus. Pulmonologist on-call has been consulted and patient was started on empiric antibiotics and admitted for further management and may need bronchoscopy.  Review of Systems: As per HPI, rest all negative.   Past Medical History:  Diagnosis Date  . Anemia 02/20/2015  . Aortic stenosis    a. 2D Echo 07/2013: EF 60-65%, no RWMA, grade 1 DD, normal LV filling pressure, moderate AS, mild TR, PASP 1mmHg.  . Bradycardia    a. possible bradycardia with HR 44 by physical exam notes at nephrologist office in 03/2014. F/u event monitor showed NSR PACs average HR 81, bradycardia <1% of readable data, no pauses of 3 seconds or longer..  . Cataract   . Chronic respiratory failure ([place])    a. On home O2 since 2012.  . CKD (chronic kidney disease), stage III   . COPD (chronic obstructive pulmonary disease) ([place])   . Dementia   . Hypertension   . Osteoporosis   . Other psoriasis and similar disorders    psoriasis vs eczema  . Paroxysmal atrial fibrillation ([place])    a. Dx 07/2013. No attempts per Epic to restore NSR but in NSR 2016.  Marland Kitchen Pneumonia 03/2016  . Skin cancer     Past Surgical History:  Procedure Laterality Date  . BREAST SURGERY Right 1964   tumor removed  . CATARACT EXTRACTION Bilateral   . ERCP N/A 02/29/2016   Procedure: ENDOSCOPIC RETROGRADE CHOLANGIOPANCREATOGRAPHY (ERCP);   Surgeon: Irene Shipper, MD;  Location: [facility];  Service: Endoscopy;  Laterality: N/A;  . HIP SURGERY Right    fracture     reports that she quit smoking about 6 years ago. Her smoking use included Cigarettes. She has a 55.00 pack-year smoking history. She has never used smokeless tobacco. She reports that she does not drink alcohol or use drugs.  Allergies  Allergen Reactions  . Codeine Nausea And Vomiting  . Robitussin (Alcohol Free) [Guaifenesin] Other (See Comments)    Per MAR    Family History  Problem Relation Age of Onset  . Asthma Mother   . Kidney disease Mother   . Lung cancer Father   . Asthma Maternal Grandmother   . Lung cancer Paternal Uncle        father's twin brother  . Diabetes Brother     Prior to Admission medications   Medication Sig Start Date End Date Taking? Authorizing Provider  albuterol (PROAIR HFA) 108 (90 BASE) MCG/ACT inhaler Inhale 2 puffs into the lungs every 4 (four) hours as needed for wheezing or shortness of breath. 12/24/14  Yes Collene Gobble, MD  alendronate (FOSAMAX) 70 MG tablet Take 70 mg by mouth every Monday. Take with a full glass of water on an empty stomach.   Yes [provider]  calcium carbonate (OS-CAL) 600 MG TABS tablet Take 2 tablets by mouth daily with  breakfast.    Yes [provider]  Cholecalciferol (VITAMIN D) 2000 UNITS CAPS Take 2,000 Units by mouth daily with breakfast.    Yes [provider]  esomeprazole (NEXIUM) 20 MG capsule Take 20 mg by mouth daily at 12 noon.   Yes [provider]  feeding supplement (BOOST / RESOURCE BREEZE) LIQD Take 1 Container by mouth every evening.    Yes [provider]  fluticasone (FLONASE) 50 MCG/ACT nasal spray Place 1 spray into both nostrils daily. 03/03/16  Yes Eugenie Filler, MD  levalbuterol Penne Lash) 0.63 MG/3ML nebulizer solution Take 3 mLs (0.63 mg total) by nebulization every 6 (six) hours as needed for wheezing or shortness of  breath. 03/02/16  Yes Eugenie Filler, MD  Multiple Vitamins-Iron (MULTIVITAMIN/IRON PO) Take 1 tablet by mouth daily.   Yes [provider]  OXYGEN Inhale 2-4 L into the lungs continuous. To keep O2 stats >90%   Yes [provider]  rivaroxaban (XARELTO) 20 MG TABS tablet Take 1 tablet (20 mg total) by mouth daily. Resume in 1 week, 03/09/2016. 03/09/16  Yes Eugenie Filler, MD  senna-docusate (SENOKOT-S) 8.6-50 MG tablet Take 1 tablet by mouth at bedtime as needed for mild constipation. 02/23/15  Yes Theodis Blaze, MD  Tiotropium Bromide Monohydrate (SPIRIVA RESPIMAT) 2.5 MCG/ACT AERS Inhale 2 puffs into the lungs daily.   Yes [provider]  acetaminophen (TYLENOL) 325 MG tablet Take 650 mg by mouth every 6 (six) hours as needed (for pain).     [provider]    Physical Exam: Vitals:   11/29/16 1915 11/29/16 1930 11/29/16 1945 11/29/16 2015  BP: 135/64 (!) 117/53 (!) 119/57 126/62  Pulse: (!) 101 90 89 87  Resp: 18 12  13   Temp:      TempSrc:      SpO2: 98% 100% 100% 100%  Weight:      Height:          Constitutional: Moderately built and nourished. Vitals:   11/29/16 1915 11/29/16 1930 11/29/16 1945 11/29/16 2015  BP: 135/64 (!) 117/53 (!) 119/57 126/62  Pulse: (!) 101 90 89 87  Resp: 18 12  13   Temp:      TempSrc:      SpO2: 98% 100% 100% 100%  Weight:      Height:       Eyes: Anicteric no pallor. ENMT: No discharge from the ears eyes nose or mouth. Neck: No mass felt. No neck rigidity. Respiratory: No rhonchi or crepitations. Cardiovascular: S1 and S2 heard no murmurs appreciated. Abdomen: Soft nontender bowel sounds present. Musculoskeletal: No edema. No joint effusion. Skin: No rash. Skin appears warm. Neurologic: Alert awake oriented to time place and person. Moves all extremities. Psychiatric: Appears normal. Normal affect.   Labs on Admission: I have personally reviewed following labs and imaging  studies  CBC:  Recent Labs Lab 11/29/16 1636  WBC 8.5  NEUTROABS 6.9  HGB 8.4*  HCT 28.4*  MCV 81.8  PLT 401   Basic Metabolic Panel:  Recent Labs Lab 11/29/16 1636  NA 138  K 4.3  CL 98*  CO2 34*  GLUCOSE 97  BUN 17  CREATININE 1.15*  CALCIUM 10.7*   GFR: Estimated Creatinine Clearance: 35.7 mL/min (A) (by C-G formula based on SCr of 1.15 mg/dL (H)). Liver Function Tests:  Recent Labs Lab 11/29/16 1636  AST 17  ALT 10*  ALKPHOS 60  BILITOT 0.3  PROT 7.1  ALBUMIN 3.0*  No results for input(s): LIPASE, AMYLASE in the last 168 hours. No results for input(s): AMMONIA in the last 168 hours. Coagulation Profile:  Recent Labs Lab 11/29/16 1636  INR 1.93   Cardiac Enzymes: No results for input(s): CKTOTAL, CKMB, CKMBINDEX, TROPONINI in the last 168 hours. BNP (last 3 results) No results for input(s): PROBNP in the last 8760 hours. HbA1C: No results for input(s): HGBA1C in the last 72 hours. CBG: No results for input(s): GLUCAP in the last 168 hours. Lipid Profile: No results for input(s): CHOL, HDL, LDLCALC, TRIG, CHOLHDL, LDLDIRECT in the last 72 hours. Thyroid Function Tests: No results for input(s): TSH, T4TOTAL, FREET4, T3FREE, THYROIDAB in the last 72 hours. Anemia Panel: No results for input(s): VITAMINB12, FOLATE, FERRITIN, TIBC, IRON, RETICCTPCT in the last 72 hours. Urine analysis:    Component Value Date/Time   COLORURINE AMBER (A) 02/24/2016 1650   APPEARANCEUR CLEAR 02/24/2016 1650   LABSPEC 1.017 02/24/2016 1650   PHURINE 7.5 02/24/2016 1650   GLUCOSEU NEGATIVE 02/24/2016 1650   HGBUR NEGATIVE 02/24/2016 1650   BILIRUBINUR SMALL (A) 02/24/2016 1650   KETONESUR NEGATIVE 02/24/2016 1650   PROTEINUR 30 (A) 02/24/2016 1650   UROBILINOGEN 0.2 01/11/2015 2337   NITRITE NEGATIVE 02/24/2016 1650   LEUKOCYTESUR NEGATIVE 02/24/2016 1650   Sepsis Labs: @LABRCNTIP (procalcitonin:4,lacticidven:4) )No results found for this or any previous  visit (from the past 240 hour(s)).   Radiological Exams on Admission: Dg Chest 2 View  Result Date: 11/29/2016 CLINICAL DATA:  Hemoptysis.  History of pneumonia. EXAM: CHEST  2 VIEW COMPARISON:  03/10/2016 FINDINGS: The cardiomediastinal silhouette is obscured by a large left pleural effusion. There is severe leftward mediastinal shift. Calcific atherosclerotic disease of the aorta noted. The right lung is clear.  The left lung is not visualized. Compression deformity of T10 and L1 vertebral bodies, stable from prior CT dated November 2017. Soft tissues are grossly normal. IMPRESSION: Severe leftward mediastinal shift with deviation of the carina to the midline of the left hemithorax. Large left pleural effusion. Left lung is not visualized. Possible left lung collapse. Clear right lung. Further evaluation with chest CT with contrast is recommended to establish the reason for severe leftward mediastinal shift. These results were called by telephone at the time of interpretation on 11/29/2016 at 4:38 pm to Dr. Nanda Quinton , who verbally acknowledged these results. Electronically Signed   By: Fidela Salisbury M.D.   On: 11/29/2016 16:39   Ct Chest W Contrast  Result Date: 11/29/2016 CLINICAL DATA:  Abnormal chest radiograph with LEFT lung collapse and mediastinal shift to the LEFT EXAM: CT CHEST WITH CONTRAST TECHNIQUE: Multidetector CT imaging of the chest was performed during intravenous contrast administration. Sagittal and coronal MPR images reconstructed from axial data set. CONTRAST:  42mL ISOVUE-300 IOPAMIDOL (ISOVUE-300) INJECTION 61% IV. COMPARISON:  Chest radiograph 11/29/2016 FINDINGS: Cardiovascular: Atherosclerotic calcifications aorta, proximal great vessels, and coronary arteries. Thoracic aorta normal caliber. Thoracic vascular structures grossly patent on nondedicated exam. Minimal pericardial effusion. Heart size normal. Mediastinum/Nodes: Mediastinal shift to the LEFT. Moderate-sized  hiatal hernia. Question wall thickening of distal esophagus and gastroesophageal junction, versus artifact from incomplete distention. Scattered normal size mediastinal lymph nodes. No thoracic adenopathy. Base of cervical region unremarkable. Lungs/Pleura: Small LEFT pleural effusion. Subtotal atelectasis of LEFT lung with fluid filling the LEFT mainstem and LEFT lower lobe bronchi as well as more peripheral portions of LEFT upper and LEFT lower lobe bronchi. Minimal residual aeration of LEFT upper lobe. No discrete mass or obstructing lesion  is delineated. Underlying emphysematous changes of the RIGHT lung with central peribronchial thickening but no definite infiltrate. No RIGHT pleural effusion or mass. Upper Abdomen: Pneumobilia consistent with history of prior ERCP. Remaining visualized upper abdomen unremarkable. Musculoskeletal: Diffuse osseous demineralization. Marked compression deformities of T10 and L1 vertebral bodies. Old healed BILATERAL rib fractures. IMPRESSION: Subtotal atelectasis of LEFT lung with extensive fluid/mucous throughout LEFT mainstem bronchus, LEFT lower lobe bronchus and extending into subsegmental bronchi in both the upper and lower lobes. A discrete obstructing mass is not visualized but is not completely excluded ; consider follow-up proctoscopy. Associated small LEFT pleural effusion. Underlying COPD changes RIGHT lung. Extensive atherosclerotic disease including coronary artery calcifications. Small hiatal hernia. Aortic Atherosclerosis (ICD10-I70.0) and Emphysema (ICD10-J43.9). Electronically Signed   By: Lavonia Dana M.D.   On: 11/29/2016 18:51    EKG: Independently reviewed. Normal sinus rhythm.  Assessment/Plan Active Problems:   COPD (chronic obstructive pulmonary disease) ([facility])   Aortic stenosis   PAF (paroxysmal atrial fibrillation) ([facility])   CKD (chronic kidney disease), stage III   Acute on chronic respiratory failure with hypoxia ([facility])   Diastolic dysfunction    Acute respiratory failure ([place])    1. Acute respiratory failure with hypoxia with hemoptysis - with subtotal long atelectasis of the left side with mucus and fluid in the left mainstem bronchus. I have discussed with Dr. Ashok Cordia on call pulmonologist will be seeing in consult. Plan is to do bronchoscopy in the next 24-48 hours since patient is on xarelto. Xarelto will be held and patient is agreeable. Continue empiric antibiotics. 2. A. fib rate is controlled without any medications. Xarelto on hold secondary to hemoptysis and possible procedure. Chads 2 vasc score is 2.  3.  COPD presently not wheezing. Continue inhalers. 4. Anemia, normocytic normochromic, follow CBC to rule out any significant bleed.  I have reviewed patient's old charts and discussed with on-call pulmonologist.   DVT prophylaxis: SCDs. CODE Status      DO NOT RESUSCITATE.   Family Communication: Discussed with patient.  Disposition Plan: Home.  Consults called: Pulmonologist.  Admission status: Inpatient.    Rise Patience MD [facility] Pager [phone].  If 7PM-7AM, please contact night-coverage [email or web address] Password [email or web address]  11/29/2016, 8:23 PM

## 2016-11-29 NOTE — Consult Note (Signed)
Name: Paula Contreras MRN: [id number] DOB: Apr 20, 1933    ADMISSION DATE:  11/29/2016 CONSULTATION DATE:  11/29/2016  REFERRING MD :  Dr. Laverta Baltimore  CHIEF COMPLAINT:  Hemoptysis   HISTORY OF PRESENT ILLNESS:   83 year old female with PMH of PAF on Xarelto, COPD on 3L home O2, former smoker- quit 6 years ago, AS, diastolic HF, HTN, and CKD stage III who presented on 8/30 with hemopytsis.  Patient is from SNF.  She is a DNR.  Presented by EMS with one day history of painful cough with hemoptysis with large clots.  Patient is currently on Xarelto for PAF.  Denies fever, SOB, chills, recent sickness, pain, or weight loss/gain.  In the ER, patient was afebrile with no hypoxemia or increased work of breathing.  Labs with WBC 8.5, PT 21.9, Hgb 8.4 (previously 11 on 03/2016), INR 1.93.  CXR showed large left pleural effusion and opacity of the left hemithorax.  CT Chest shows left subtotal atelectasis of left lung with extensive fluid/mucous throughout left mainstem bronchus, left lower lobe bronchus and extending into subsegmental bronchi in both upper and lower lobes with minimal residual aeration of left upper lobe and small left pleural effusion.  Started on empiric vancomycin and cefepime for HCAP coverage and admitted to SDU by [place].  Pulmonary consulting.    PAST MEDICAL HISTORY :   has a past medical history of Anemia (02/20/2015); Aortic stenosis; Bradycardia; Cataract; Chronic respiratory failure (HCC); CKD (chronic kidney disease), stage III; COPD (chronic obstructive pulmonary disease) ([place]); Dementia; Hypertension; Osteoporosis; Other psoriasis and similar disorders; Paroxysmal atrial fibrillation ([place]); Pneumonia (03/2016); and Skin cancer.  has a past surgical history that includes Cataract extraction (Bilateral); Breast surgery (Right, 1964); Hip surgery (Right); and ERCP (N/A, 02/29/2016). Prior to Admission medications   Medication Sig Start Date End Date Taking? Authorizing Provider    albuterol (PROAIR HFA) 108 (90 BASE) MCG/ACT inhaler Inhale 2 puffs into the lungs every 4 (four) hours as needed for wheezing or shortness of breath. 12/24/14  Yes Collene Gobble, MD  alendronate (FOSAMAX) 70 MG tablet Take 70 mg by mouth every Monday. Take with a full glass of water on an empty stomach.   Yes [provider]  calcium carbonate (OS-CAL) 600 MG TABS tablet Take 2 tablets by mouth daily with breakfast.    Yes [provider]  Cholecalciferol (VITAMIN D) 2000 UNITS CAPS Take 2,000 Units by mouth daily with breakfast.    Yes [provider]  esomeprazole (NEXIUM) 20 MG capsule Take 20 mg by mouth daily at 12 noon.   Yes [provider]  feeding supplement (BOOST / RESOURCE BREEZE) LIQD Take 1 Container by mouth every evening.    Yes [provider]  fluticasone (FLONASE) 50 MCG/ACT nasal spray Place 1 spray into both nostrils daily. 03/03/16  Yes Eugenie Filler, MD  levalbuterol Penne Lash) 0.63 MG/3ML nebulizer solution Take 3 mLs (0.63 mg total) by nebulization every 6 (six) hours as needed for wheezing or shortness of breath. 03/02/16  Yes Eugenie Filler, MD  Multiple Vitamins-Iron (MULTIVITAMIN/IRON PO) Take 1 tablet by mouth daily.   Yes [provider]  OXYGEN Inhale 2-4 L into the lungs continuous. To keep O2 stats >90%   Yes [provider]  rivaroxaban (XARELTO) 20 MG TABS tablet Take 1 tablet (20 mg total) by mouth daily. Resume in 1 week, 03/09/2016. 03/09/16  Yes Eugenie Filler, MD  senna-docusate (SENOKOT-S) 8.6-50 MG tablet Take 1 tablet  by mouth at bedtime as needed for mild constipation. 02/23/15  Yes Theodis Blaze, MD  Tiotropium Bromide Monohydrate (SPIRIVA RESPIMAT) 2.5 MCG/ACT AERS Inhale 2 puffs into the lungs daily.   Yes [provider]  acetaminophen (TYLENOL) 325 MG tablet Take 650 mg by mouth every 6 (six) hours as needed (for pain).     [provider]   Allergies   Allergen Reactions  . Codeine Nausea And Vomiting  . Robitussin (Alcohol Free) [Guaifenesin] Other (See Comments)    Per [facility]    FAMILY HISTORY:  family history includes Asthma in her maternal grandmother and mother; Diabetes in her brother; Kidney disease in her mother; Lung cancer in her father and paternal uncle. SOCIAL HISTORY:  reports that she quit smoking about 6 years ago. Her smoking use included Cigarettes. She has a 55.00 pack-year smoking history. She has never used smokeless tobacco. She reports that she does not drink alcohol or use drugs.  REVIEW OF SYSTEMS:  POSITIVES IN BOLD Constitutional: Negative for fever, chills, weight loss, malaise/fatigue and diaphoresis.  HENT: Negative for hearing loss, ear pain, nosebleeds, congestion, sore throat, neck pain, tinnitus and ear discharge.   Eyes: Negative for blurred vision, double vision, photophobia, pain, discharge and redness.  Respiratory: Negative for cough, hemoptysis, sputum production, shortness of breath, wheezing and stridor.   Cardiovascular: Negative for chest pain, palpitations, orthopnea, claudication, leg swelling and PND.  Gastrointestinal: Negative for heartburn, nausea, vomiting, abdominal pain, diarrhea, constipation, blood in stool and melena.  Genitourinary: Negative for dysuria, urgency, frequency, hematuria and flank pain.  Musculoskeletal: Negative for myalgias, back pain, joint pain and falls.  Skin: Negative for itching and rash.  Neurological: Negative for dizziness, tingling, tremors, sensory change, speech change, focal weakness, seizures, loss of consciousness, weakness and headaches.  Endo/Heme/Allergies: Negative for environmental allergies and polydipsia. Does not bruise/bleed easily.  SUBJECTIVE:  Denies any SOB, pain, or other complaints. No recent hemoptysis in several hours per RN  VITAL SIGNS: Temp:  [98.9 F (37.2 C)] 98.9 F (37.2 C) (08/30 1428) Pulse Rate:  [82-103] 101 (08/30  1915) Resp:  [10-18] 18 (08/30 1915) BP: (101-135)/(56-73) 135/64 (08/30 1915) SpO2:  [94 %-100 %] 98 % (08/30 1915) Weight:  [130 lb (59 kg)] 130 lb (59 kg) (08/30 1900)  PHYSICAL EXAMINATION: General:  Elderly female lying in bed in NAD HEENT: MM pale, dry, PERRL, no JVD PSY: Pleasant Neuro: Alert and oriented, MAE, non-focal, slight hard of hearing CV: rrr PULM: even/non-labored, normal WOB, clear on right, diminished on left, on 3L O2 at 100%, no wheezing OZ:HYQM, non-tender, bsx4 active  Extremities: warm/dry, no edema    Recent Labs Lab 11/29/16 1636  NA 138  K 4.3  CL 98*  CO2 34*  BUN 17  CREATININE 1.15*  GLUCOSE 97    Recent Labs Lab 11/29/16 1636  HGB 8.4*  HCT 28.4*  WBC 8.5  PLT 380   Dg Chest 2 View  Result Date: 11/29/2016 CLINICAL DATA:  Hemoptysis.  History of pneumonia. EXAM: CHEST  2 VIEW COMPARISON:  03/10/2016 FINDINGS: The cardiomediastinal silhouette is obscured by a large left pleural effusion. There is severe leftward mediastinal shift. Calcific atherosclerotic disease of the aorta noted. The right lung is clear.  The left lung is not visualized. Compression deformity of T10 and L1 vertebral bodies, stable from prior CT dated November 2017. Soft tissues are grossly normal. IMPRESSION: Severe leftward mediastinal shift with deviation of the carina to the midline of the left  hemithorax. Large left pleural effusion. Left lung is not visualized. Possible left lung collapse. Clear right lung. Further evaluation with chest CT with contrast is recommended to establish the reason for severe leftward mediastinal shift. These results were called by telephone at the time of interpretation on 11/29/2016 at 4:38 pm to Dr. Nanda Quinton , who verbally acknowledged these results. Electronically Signed   By: Fidela Salisbury M.D.   On: 11/29/2016 16:39   Ct Chest W Contrast  Result Date: 11/29/2016 CLINICAL DATA:  Abnormal chest radiograph with LEFT lung collapse  and mediastinal shift to the LEFT EXAM: CT CHEST WITH CONTRAST TECHNIQUE: Multidetector CT imaging of the chest was performed during intravenous contrast administration. Sagittal and coronal MPR images reconstructed from axial data set. CONTRAST:  20mL ISOVUE-300 IOPAMIDOL (ISOVUE-300) INJECTION 61% IV. COMPARISON:  Chest radiograph 11/29/2016 FINDINGS: Cardiovascular: Atherosclerotic calcifications aorta, proximal great vessels, and coronary arteries. Thoracic aorta normal caliber. Thoracic vascular structures grossly patent on nondedicated exam. Minimal pericardial effusion. Heart size normal. Mediastinum/Nodes: Mediastinal shift to the LEFT. Moderate-sized hiatal hernia. Question wall thickening of distal esophagus and gastroesophageal junction, versus artifact from incomplete distention. Scattered normal size mediastinal lymph nodes. No thoracic adenopathy. Base of cervical region unremarkable. Lungs/Pleura: Small LEFT pleural effusion. Subtotal atelectasis of LEFT lung with fluid filling the LEFT mainstem and LEFT lower lobe bronchi as well as more peripheral portions of LEFT upper and LEFT lower lobe bronchi. Minimal residual aeration of LEFT upper lobe. No discrete mass or obstructing lesion is delineated. Underlying emphysematous changes of the RIGHT lung with central peribronchial thickening but no definite infiltrate. No RIGHT pleural effusion or mass. Upper Abdomen: Pneumobilia consistent with history of prior ERCP. Remaining visualized upper abdomen unremarkable. Musculoskeletal: Diffuse osseous demineralization. Marked compression deformities of T10 and L1 vertebral bodies. Old healed BILATERAL rib fractures. IMPRESSION: Subtotal atelectasis of LEFT lung with extensive fluid/mucous throughout LEFT mainstem bronchus, LEFT lower lobe bronchus and extending into subsegmental bronchi in both the upper and lower lobes. A discrete obstructing mass is not visualized but is not completely excluded ; consider  follow-up proctoscopy. Associated small LEFT pleural effusion. Underlying COPD changes RIGHT lung. Extensive atherosclerotic disease including coronary artery calcifications. Small hiatal hernia. Aortic Atherosclerosis (ICD10-I70.0) and Emphysema (ICD10-J43.9). Electronically Signed   By: Lavonia Dana M.D.   On: 11/29/2016 18:51   STUDIES:  8/30 CXR >> leftward mediastinal shift w/deviation of the carina to midline of left hemithorax;  large left pleural effusion and opacity of the left hemithorax, possible left lung collapse; clear right lung 8/30 CT chest >> subtotal atelectasis of left lung with extensive fluid/mucous throughout Left mainstem bronchus, left lower lobe bronchus and extending into subsegmental bronchi in both upper and lower lobes with minimal residual aeration of left upper lobe; a discrete obstructing mass is not visualized but not excluded; associated small left pleural effusion; underlying COPD changes right lung; extensive atherosclerotic disease including coronary artery calcifications; small hiatal hernia; aortic atherosclerosis and emphysema  SIGNIFICANT EVENTS  8/30 Admit to [facility]  ANTIBIOTICS 8/30 vancomycin >> 8/30 cefepime >>  BRIEF PATIENT DESCRIPTION:  48 yoF from SNF w/PMH of COPD on 3L O2, PAF on Xarelto, dCHF, HTN presenting with one day history of hemoptysis with Hgb 8.4 with CT chest showing subtotal atelectasis of left lung with mediastinal shift to left with small pleural effusion.  No hypoxemia or increased WOB.  Admitted to SDU by [place].  ASSESSMENT / PLAN:  Non-massive Hemoptysis with LEFT subtotal atelectasis, mediastinal shift to the  left, small left pleural effusion w/concern for endobronchial obstruction - ddx infection vs malignancy vs mucous plugging R/o HCAP COPD on home O2 3L without acute exacerbation PAF on Xarelto- currently SR  P: Continue supplemental O2 for sats 89-32% Hold Xarelto, no need to reverse at this point given non-massive  hemoptysis  F/u Hgb and CXR in am Tessalon Perles for cough suppresion Continue xopenex and spiriva  Re-eval after 48-72 hours w/ holding Xarelto for possible Bronch vs repeat CT imaging based on clinical course.  Can also consider looking at left effusion with U/S to consider thoracentesis to evaluate for malignancy vs exudative effusion. Continue with empiric vanc and cefepime Check PCT Send sputum cx   Remainder per Primary.  Pulmonary will continue to follow.   Kennieth Rad, AGACNP-BC [facility] Pgr: [phone] or if no answer [phone] 11/29/2016, 8:56 PM

## 2016-11-29 NOTE — Progress Notes (Signed)
Pharmacy Antibiotic Note  Paula Contreras is a 83 y.o. female admitted on 11/29/2016 with hemoptysis.  Pharmacy has been consulted for vancomycin and cefepime dosing for PNA/HCAP.   Scr 1.15, CrCl ~ 35ml/min. Vancomycin 1000 mg IV x 1 and Cefepime 2g IV Q24 x 1 ordered by the ED.   Plan: Vancomycin 500 mg IV q12h Cefepime 2g IV Q24h Monitor clinical progression, deescalation, and LOT.     Temp (24hrs), Avg:98.9 F (37.2 C), Min:98.9 F (37.2 C), Max:98.9 F (37.2 C)   Recent Labs Lab 11/29/16 1636  WBC 8.5  CREATININE 1.15*    CrCl cannot be calculated (Unknown ideal weight.).    Allergies  Allergen Reactions  . Codeine Nausea And Vomiting  . Robitussin (Alcohol Free) [Guaifenesin] Other (See Comments)    Per [facility]    Leroy Libman, PharmD Pharmacy Resident Pager: [phone]  11/29/2016 7:08 PM

## 2016-11-29 NOTE — ED Notes (Signed)
Patient transported to X-ray 

## 2016-11-29 NOTE — ED Notes (Signed)
Pt continues to cough up blood at bedside and requesting an update, Dr. Laverta Baltimore notified.

## 2016-11-29 NOTE — ED Notes (Signed)
Pt requesting update, MD notified

## 2016-11-29 NOTE — ED Notes (Signed)
Admitting at bedside 

## 2016-11-29 NOTE — ED Notes (Signed)
Patient transported to CT 

## 2016-11-30 ENCOUNTER — Encounter (HOSPITAL_COMMUNITY): Payer: Self-pay

## 2016-11-30 ENCOUNTER — Inpatient Hospital Stay (HOSPITAL_COMMUNITY): Payer: Medicare Other

## 2016-11-30 DIAGNOSIS — R042 Hemoptysis: Secondary | ICD-10-CM

## 2016-11-30 DIAGNOSIS — J9811 Atelectasis: Secondary | ICD-10-CM

## 2016-11-30 LAB — MRSA PCR SCREENING: MRSA by PCR: POSITIVE — AB

## 2016-11-30 LAB — BASIC METABOLIC PANEL
Anion gap: 8 (ref 5–15)
BUN: 19 mg/dL (ref 6–20)
CHLORIDE: 99 mmol/L — AB (ref 101–111)
CO2: 31 mmol/L (ref 22–32)
Calcium: 9.9 mg/dL (ref 8.9–10.3)
Creatinine, Ser: 1.05 mg/dL — ABNORMAL HIGH (ref 0.44–1.00)
GFR calc Af Amer: 56 mL/min — ABNORMAL LOW (ref >60)
GFR calc non Af Amer: 48 mL/min — ABNORMAL LOW (ref >60)
GLUCOSE: 90 mg/dL (ref 65–99)
POTASSIUM: 4.1 mmol/L (ref 3.5–5.1)
Sodium: 138 mmol/L (ref 135–145)

## 2016-11-30 LAB — CBC
HEMATOCRIT: 27.1 % — AB (ref 36.0–46.0)
HEMOGLOBIN: 7.8 g/dL — AB (ref 12.0–15.0)
MCH: 23.5 pg — AB (ref 26.0–34.0)
MCHC: 28.8 g/dL — AB (ref 30.0–36.0)
MCV: 81.6 fL (ref 78.0–100.0)
Platelets: 357 10*3/uL (ref 150–400)
RBC: 3.32 MIL/uL — ABNORMAL LOW (ref 3.87–5.11)
RDW: 15 % (ref 11.5–15.5)
WBC: 8.8 10*3/uL (ref 4.0–10.5)

## 2016-11-30 MED ORDER — SODIUM CHLORIDE 3 % IN NEBU
4.0000 mL | INHALATION_SOLUTION | Freq: Two times a day (BID) | RESPIRATORY_TRACT | Status: AC
  Administered 2016-11-30 – 2016-12-02 (×5): 4 mL via RESPIRATORY_TRACT
  Filled 2016-11-30 (×7): qty 4

## 2016-11-30 MED ORDER — LEVALBUTEROL HCL 0.63 MG/3ML IN NEBU
0.6300 mg | INHALATION_SOLUTION | Freq: Four times a day (QID) | RESPIRATORY_TRACT | Status: DC
  Administered 2016-11-30 – 2016-12-04 (×16): 0.63 mg via RESPIRATORY_TRACT
  Filled 2016-11-30 (×22): qty 3

## 2016-11-30 MED ORDER — LEVALBUTEROL HCL 0.63 MG/3ML IN NEBU
INHALATION_SOLUTION | RESPIRATORY_TRACT | Status: AC
  Filled 2016-11-30: qty 3

## 2016-11-30 MED ORDER — ACETYLCYSTEINE 20 % IN SOLN
4.0000 mL | Freq: Two times a day (BID) | RESPIRATORY_TRACT | Status: DC
  Administered 2016-11-30 – 2016-12-01 (×4): 4 mL via RESPIRATORY_TRACT
  Filled 2016-11-30 (×8): qty 4

## 2016-11-30 MED ORDER — PANTOPRAZOLE SODIUM 40 MG IV SOLR
40.0000 mg | INTRAVENOUS | Status: DC
  Administered 2016-11-30 – 2016-12-04 (×5): 40 mg via INTRAVENOUS
  Filled 2016-11-30 (×4): qty 40

## 2016-11-30 NOTE — Progress Notes (Signed)
[place] TEAM 1 - Stepdown/ICU TEAM  Maricela Curet  ZOX:[id number] DOB: [birth year] DOA: 11/29/2016 PCP: Josetta Huddle, MD    Brief Narrative:  83 y.o. female with a history of COPD and paroxysmal atrial fibrillation who presented to the ER with complaints of hemoptysis.  Subjective: The patient is resting comfortably in bed at the present time.  She denies complaints but she rapidly desaturates if she takes off her oxygen.  She specifically denies chest pain nausea vomiting or abdominal pain.  Assessment & Plan:  Acute hypoxic respiratory failure with hemoptysis - L lung collapse  Not improving w/ conservative tx - will likely need bronchoscopy - Pulm is following    Chronic Parox A. Fib rate is controlled without any medications - Xarelto on hold due to hemopytis and possible need for bronch - CHA2DS2-VASc is 2  COPD Well compensated at this time  Anemia, normocytic normochromic Not hemodynamically significant at present - follow in serial fashion  MRSA screen +  DVT prophylaxis: SCDs Code Status: FULL CODE Family Communication: no family present at time of exam  Disposition Plan:   Consultants:  PCCM  Procedures: none  Antimicrobials:  Cefepime 8/30 > Vancomycin 8/30 >  Objective: Blood pressure 104/67, pulse 68, temperature 97.6 F (36.4 C), temperature source Oral, resp. rate 18, height 5\' 7"  (1.702 m), weight 59 kg (130 lb), SpO2 98 %.  Intake/Output Summary (Last 24 hours) at 11/30/16 1407 Last data filed at 11/30/16 0900  Gross per 24 hour  Intake              610 ml  Output                1 ml  Net              609 ml   Filed Weights   11/29/16 1900  Weight: 59 kg (130 lb)    Examination: General: No acute respiratory distress at rest  Lungs: No breath sounds appreciated across left chest - good breath sounds on right with no wheezing or focal crackles Cardiovascular: Regular rate without murmur gallop or rub Abdomen: Nontender, nondistended,  soft, bowel sounds positive, no rebound, no ascites, no appreciable mass Extremities: No significant cyanosis, clubbing, or edema bilateral lower extremities  CBC:  Recent Labs Lab 11/29/16 1636 11/30/16 0323  WBC 8.5 8.8  NEUTROABS 6.9  --   HGB 8.4* 7.8*  HCT 28.4* 27.1*  MCV 81.8 81.6  PLT 380 811   Basic Metabolic Panel:  Recent Labs Lab 11/29/16 1636 11/30/16 0323  NA 138 138  K 4.3 4.1  CL 98* 99*  CO2 34* 31  GLUCOSE 97 90  BUN 17 19  CREATININE 1.15* 1.05*  CALCIUM 10.7* 9.9   GFR: Estimated Creatinine Clearance: 39.1 mL/min (A) (by C-G formula based on SCr of 1.05 mg/dL (H)).  Liver Function Tests:  Recent Labs Lab 11/29/16 1636  AST 17  ALT 10*  ALKPHOS 60  BILITOT 0.3  PROT 7.1  ALBUMIN 3.0*    Coagulation Profile:  Recent Labs Lab 11/29/16 1636  INR 1.93    Recent Results (from the past 240 hour(s))  MRSA PCR Screening     Status: Abnormal   Collection Time: 11/30/16  4:10 AM  Result Value Ref Range Status   MRSA by PCR POSITIVE (A) NEGATIVE Final    Comment:        The GeneXpert MRSA Assay (FDA approved for NASAL specimens only), is one component of a  comprehensive MRSA colonization surveillance program. It is not intended to diagnose MRSA infection nor to guide or monitor treatment for MRSA infections. RESULT CALLED TO, READ BACK BY AND VERIFIED WITH: C.WOODARD, RN 11/30/16 0636 L.CHAMPION      Scheduled Meds: . acetylcysteine  4 mL Nebulization BID  . fluticasone  1 spray Each Nare Daily  . levalbuterol  0.63 mg Nebulization QID  . pantoprazole (PROTONIX) IV  40 mg Intravenous Q24H  . sodium chloride HYPERTONIC  4 mL Nebulization BID  . tiotropium  18 mcg Inhalation Daily     LOS: 1 day   Cherene Altes, MD [facility] Office  [phone] Pager - Text Page per Amion as per below:  On-Call/Text Page:      [email or web address]      password [email or web address]  If 7PM-7AM, please contact  night-coverage [email or web address] Password [email or web address] 11/30/2016, 2:07 PM

## 2016-11-30 NOTE — Care Management Note (Signed)
Case Management Note  Patient Details  Name: Birgitta Uhlir MRN: [id number] Date of Birth: [birth year]  Subjective/Objective:  From [facility], presents with history of COPD and paroxysmal atrial fibrillation who came to the ER with complaints of hemoptysis.                   Action/Plan: NCM will follow along with CSW for dc needs.   Expected Discharge Date:                  Expected Discharge Plan:  Assisted Living / Rest Home  In-House Referral:  Clinical Social Work  Discharge planning Services  CM Consult  Post Acute Care Choice:    Choice offered to:     DME Arranged:    DME Agency:     HH Arranged:    HH Agency:     Status of Service:  In process, will continue to follow  If discussed at Long Length of Stay Meetings, dates discussed:    Additional Comments:  Zenon Mayo, RN 11/30/2016, 3:07 PM

## 2016-11-30 NOTE — Progress Notes (Signed)
Name: Paula Contreras MRN: [id number] DOB: [birth year]    ADMISSION DATE:  11/29/2016 CONSULTATION DATE:  11/29/2016  REFERRING MD :  Dr. Laverta Baltimore  CHIEF COMPLAINT:  Hemoptysis   HISTORY OF PRESENT ILLNESS:   83 year old female with PMH of PAF on Xarelto, COPD on 3L home O2, former smoker- quit 6 years ago, AS, diastolic HF, HTN, and CKD stage III who presented on 8/30 with hemopytsis.  SUBJECTIVE:  No distress. Denies any further bloody sputum   VITAL SIGNS: Temp:  [97.6 F (36.4 C)-98.9 F (37.2 C)] 97.6 F (36.4 C) (08/31 0753) Pulse Rate:  [67-103] 67 (08/31 0753) Resp:  [10-24] 18 (08/31 0753) BP: (101-148)/(42-89) 113/61 (08/31 0753) SpO2:  [94 %-100 %] 96 % (08/31 0753) Weight:  [130 lb (59 kg)] 130 lb (59 kg) (08/30 1900)  General appearance:  83 Year old female, well nourished Eyes: anicteric sclerae, moist conjunctivae; PERRL, EOMI bilaterally. Mouth:  membranes and no mucosal ulcerations; normal hard and soft palate Neck: Trachea midline; neck supple, no JVD Lungs/chest: decreased on left, with normal respiratory effort and no intercostal retractions CV: RRR, no MRGs  Abdomen: Soft, non-tender; no masses or HSM Extremities: No peripheral edema or extremity lymphadenopathy Skin: Normal temperature, turgor and texture; no rash, ulcers or subcutaneous nodules Psych: very hard of hearing feisty affect, alert and oriented to person, place and time  Recent Labs Lab 11/29/16 1636 11/30/16 0323  NA 138 138  K 4.3 4.1  CL 98* 99*  CO2 34* 31  BUN 17 19  CREATININE 1.15* 1.05*  GLUCOSE 97 90    Recent Labs Lab 11/29/16 1636 11/30/16 0323  HGB 8.4* 7.8*  HCT 28.4* 27.1*  WBC 8.5 8.8  PLT 380 357   Dg Chest 2 View  Result Date: 11/29/2016 CLINICAL DATA:  Hemoptysis.  History of pneumonia. EXAM: CHEST  2 VIEW COMPARISON:  03/10/2016 FINDINGS: The cardiomediastinal silhouette is obscured by a large left pleural effusion. There is severe leftward mediastinal  shift. Calcific atherosclerotic disease of the aorta noted. The right lung is clear.  The left lung is not visualized. Compression deformity of T10 and L1 vertebral bodies, stable from prior CT dated November 2017. Soft tissues are grossly normal. IMPRESSION: Severe leftward mediastinal shift with deviation of the carina to the midline of the left hemithorax. Large left pleural effusion. Left lung is not visualized. Possible left lung collapse. Clear right lung. Further evaluation with chest CT with contrast is recommended to establish the reason for severe leftward mediastinal shift. These results were called by telephone at the time of interpretation on 11/29/2016 at 4:38 pm to Dr. Nanda Quinton , who verbally acknowledged these results. Electronically Signed   By: Fidela Salisbury M.D.   On: 11/29/2016 16:39   Ct Chest W Contrast  Addendum Date: 11/30/2016   ADDENDUM REPORT: 11/30/2016 08:23 ADDENDUM: A voice recognition error is present within the impression of the initial report. The impression should state: IMRESSION: Subtotal atelectasis of LEFT lung with extensive fluid/mucus throughout LEFT mainstem bronchus, LEFT lower lobe bronchus and extending into subsegmental bronchi in both the upper and lower lobes. A discrete obstructing mass is not visualized but is not completely excluded; consider followup BRONCHOSCOPY. Associated small LEFT pleural effusion. Underlying COPD changes RIGHT lung. Extensive atherosclerotic disease including coronary artery calcifications. Small hiatal hernia. Aortic Atherosclerosis (ICD10-I70.0) and Emphysema (ICD10-J43.9). Electronically Signed   By: Lavonia Dana M.D.   On: 11/30/2016 08:23   Result Date: 11/30/2016 CLINICAL DATA:  Abnormal chest radiograph with LEFT lung collapse and mediastinal shift to the LEFT EXAM: CT CHEST WITH CONTRAST TECHNIQUE: Multidetector CT imaging of the chest was performed during intravenous contrast administration. Sagittal and coronal MPR  images reconstructed from axial data set. CONTRAST:  20mL ISOVUE-300 IOPAMIDOL (ISOVUE-300) INJECTION 61% IV. COMPARISON:  Chest radiograph 11/29/2016 FINDINGS: Cardiovascular: Atherosclerotic calcifications aorta, proximal great vessels, and coronary arteries. Thoracic aorta normal caliber. Thoracic vascular structures grossly patent on nondedicated exam. Minimal pericardial effusion. Heart size normal. Mediastinum/Nodes: Mediastinal shift to the LEFT. Moderate-sized hiatal hernia. Question wall thickening of distal esophagus and gastroesophageal junction, versus artifact from incomplete distention. Scattered normal size mediastinal lymph nodes. No thoracic adenopathy. Base of cervical region unremarkable. Lungs/Pleura: Small LEFT pleural effusion. Subtotal atelectasis of LEFT lung with fluid filling the LEFT mainstem and LEFT lower lobe bronchi as well as more peripheral portions of LEFT upper and LEFT lower lobe bronchi. Minimal residual aeration of LEFT upper lobe. No discrete mass or obstructing lesion is delineated. Underlying emphysematous changes of the RIGHT lung with central peribronchial thickening but no definite infiltrate. No RIGHT pleural effusion or mass. Upper Abdomen: Pneumobilia consistent with history of prior ERCP. Remaining visualized upper abdomen unremarkable. Musculoskeletal: Diffuse osseous demineralization. Marked compression deformities of T10 and L1 vertebral bodies. Old healed BILATERAL rib fractures. IMPRESSION: Subtotal atelectasis of LEFT lung with extensive fluid/mucous throughout LEFT mainstem bronchus, LEFT lower lobe bronchus and extending into subsegmental bronchi in both the upper and lower lobes. A discrete obstructing mass is not visualized but is not completely excluded ; consider follow-up proctoscopy. Associated small LEFT pleural effusion. Underlying COPD changes RIGHT lung. Extensive atherosclerotic disease including coronary artery calcifications. Small hiatal hernia.  Aortic Atherosclerosis (ICD10-I70.0) and Emphysema (ICD10-J43.9). Electronically Signed: By: Lavonia Dana M.D. On: 11/29/2016 18:51   Dg Chest Port 1 View  Result Date: 11/30/2016 CLINICAL DATA:  Hemoptysis EXAM: PORTABLE CHEST 1 VIEW COMPARISON:  CT chest dated 11/29/2016 FINDINGS: Complete left lung collapse, better visualized on CT. Associated leftward cardiomediastinal shift. Right lung is clear. No pneumothorax is seen. IMPRESSION: Complete left lung collapse, better visualized on CT. Associated leftward cardiomediastinal shift. Electronically Signed   By: Julian Hy M.D.   On: 11/30/2016 07:58   STUDIES:  8/30 CXR >> leftward mediastinal shift w/deviation of the carina to midline of left hemithorax;  large left pleural effusion and opacity of the left hemithorax, possible left lung collapse; clear right lung 8/30 CT chest >> subtotal atelectasis of left lung with extensive fluid/mucous throughout Left mainstem bronchus, left lower lobe bronchus and extending into subsegmental bronchi in both upper and lower lobes with minimal residual aeration of left upper lobe; a discrete obstructing mass is not visualized but not excluded; associated small left pleural effusion; underlying COPD changes right lung; extensive atherosclerotic disease including coronary artery calcifications; small hiatal hernia; aortic atherosclerosis and emphysema  SIGNIFICANT EVENTS  8/30 Admit to [facility]  ANTIBIOTICS 8/30 vancomycin >> 8/30 cefepime >>  BRIEF PATIENT DESCRIPTION:  52 yoF from SNF w/PMH of COPD on 3L O2, PAF on Xarelto, dCHF, HTN presenting with one day history of hemoptysis with Hgb 8.4 with CT chest showing subtotal atelectasis of left lung with mediastinal shift to left with small pleural effusion.  No hypoxemia or increased WOB.  Admitted to SDU by [place].  ASSESSMENT / PLAN:  Hemoptysis with LEFT subtotal atelectasis Small left effusion R/o HCAP Chronic respiratory failure in setting of COPD H/o  PAF on Xarelto   Had mediastinal shift to  the left, w/concern for endobronchial obstruction  - ddx infection vs malignancy vs mucous plugging In setting of HCAP. Difficult to know. Does appear as though she could tolerate a bronch but given several co-morbids would be worth attempting at least 24 hours of aggressive pulmonary hygiene prior to this to see if we can improve her CXR.  I do not think she would be a candidate for aggressive therapy should malignancy be confirmed.   Plan Cont HCAP coverage Hold DOAC Wean O2 Add Flutter/vibravest, Hypertonic saline and mucomyst IF not improvement we can consider bedside bronch this weekend to help a) recruit left and b) inspect airway to r/o malignancy.    Erick Colace ACNP-BC [place] Pager # [phone] OR # [phone] if no answer

## 2016-12-01 ENCOUNTER — Inpatient Hospital Stay (HOSPITAL_COMMUNITY): Payer: Medicare Other

## 2016-12-01 DIAGNOSIS — J9600 Acute respiratory failure, unspecified whether with hypoxia or hypercapnia: Secondary | ICD-10-CM

## 2016-12-01 DIAGNOSIS — J9811 Atelectasis: Secondary | ICD-10-CM

## 2016-12-01 LAB — CBC
HEMATOCRIT: 27.2 % — AB (ref 36.0–46.0)
Hemoglobin: 7.9 g/dL — ABNORMAL LOW (ref 12.0–15.0)
MCH: 23.7 pg — ABNORMAL LOW (ref 26.0–34.0)
MCHC: 29 g/dL — ABNORMAL LOW (ref 30.0–36.0)
MCV: 81.4 fL (ref 78.0–100.0)
PLATELETS: 332 10*3/uL (ref 150–400)
RBC: 3.34 MIL/uL — ABNORMAL LOW (ref 3.87–5.11)
RDW: 15.3 % (ref 11.5–15.5)
WBC: 7.7 10*3/uL (ref 4.0–10.5)

## 2016-12-01 LAB — BASIC METABOLIC PANEL
ANION GAP: 6 (ref 5–15)
BUN: 15 mg/dL (ref 6–20)
CALCIUM: 9.1 mg/dL (ref 8.9–10.3)
CO2: 32 mmol/L (ref 22–32)
Chloride: 100 mmol/L — ABNORMAL LOW (ref 101–111)
Creatinine, Ser: 1.1 mg/dL — ABNORMAL HIGH (ref 0.44–1.00)
GFR, EST AFRICAN AMERICAN: 53 mL/min — AB (ref >60)
GFR, EST NON AFRICAN AMERICAN: 46 mL/min — AB (ref >60)
Glucose, Bld: 92 mg/dL (ref 65–99)
Potassium: 4.2 mmol/L (ref 3.5–5.1)
Sodium: 138 mmol/L (ref 135–145)

## 2016-12-01 LAB — GLUCOSE, CAPILLARY
Glucose-Capillary: 109 mg/dL — ABNORMAL HIGH (ref 65–99)
Glucose-Capillary: 91 mg/dL (ref 65–99)
Glucose-Capillary: 92 mg/dL (ref 65–99)

## 2016-12-01 LAB — PROCALCITONIN: Procalcitonin: <0.1 ng/mL

## 2016-12-01 MED ORDER — BENZOCAINE 20 % MT AERO
INHALATION_SPRAY | Freq: Once | OROMUCOSAL | Status: AC | PRN
  Administered 2016-12-02: 1 via OROMUCOSAL
  Filled 2016-12-01: qty 57

## 2016-12-01 NOTE — Progress Notes (Signed)
Patient POA notified of patient new room assignment

## 2016-12-01 NOTE — Progress Notes (Addendum)
[place] TEAM 1 - Stepdown/ICU TEAM  Paula Contreras  DJM:[id number] DOB: [birth year] DOA: 11/29/2016 PCP: Josetta Huddle, MD    Brief Narrative:  83 y.o. female with a history of COPD and paroxysmal atrial fibrillation who presented to the ER with complaints of hemoptysis.  Subjective: The patient is resting comfortably in bed.  She denies any complaints at this time.  She specifically denies shortness of breath or chest pain.  Assessment & Plan:  Acute hypoxic respiratory failure with scant hemoptysis - L lung collapse  Not improving w/ conservative tx - will likely need bronchoscopy - Pulm following - no evidence of ongoing signif bleeding   Chronic Parox A. Fib rate is controlled without any medications - Xarelto on hold due to hemopytis and possible need for bronch - CHA2DS2-VASc is 2  COPD Well compensated at this time  Anemia, normocytic normochromic Not hemodynamically significant at present - follow in serial fashion  MRSA screen +  DVT prophylaxis: SCDs Code Status: FULL CODE Family Communication: no family present at time of exam  Disposition Plan: await definitive tx per PCCM  Consultants:  PCCM  Procedures: none  Antimicrobials:  Cefepime 8/30 > Vancomycin 8/30 >  Objective: Blood pressure (!) 102/55, pulse 79, temperature 98.4 F (36.9 C), temperature source Oral, resp. rate 12, height 5\' 7"  (1.702 m), weight 59 kg (130 lb), SpO2 95 %.  Intake/Output Summary (Last 24 hours) at 12/01/16 1028 Last data filed at 11/30/16 2145  Gross per 24 hour  Intake              610 ml  Output                0 ml  Net              610 ml   Filed Weights   11/29/16 1900  Weight: 59 kg (130 lb)    Examination: General: No acute respiratory distress - alert and conversant  Lungs: No BS across left chest - good breath sounds on right with no wheezing  Cardiovascular: Regular rate without murmur  Abdomen: NT/ND, soft Extremities: No significant edema bilateral  lower extremities  CBC:  Recent Labs Lab 11/29/16 1636 11/30/16 0323 12/01/16 0557  WBC 8.5 8.8 7.7  NEUTROABS 6.9  --   --   HGB 8.4* 7.8* 7.9*  HCT 28.4* 27.1* 27.2*  MCV 81.8 81.6 81.4  PLT 380 357 222   Basic Metabolic Panel:  Recent Labs Lab 11/29/16 1636 11/30/16 0323 12/01/16 0557  NA 138 138 138  K 4.3 4.1 4.2  CL 98* 99* 100*  CO2 34* 31 32  GLUCOSE 97 90 92  BUN 17 19 15   CREATININE 1.15* 1.05* 1.10*  CALCIUM 10.7* 9.9 9.1   GFR: Estimated Creatinine Clearance: 37.4 mL/min (A) (by C-G formula based on SCr of 1.1 mg/dL (H)).  Liver Function Tests:  Recent Labs Lab 11/29/16 1636  AST 17  ALT 10*  ALKPHOS 60  BILITOT 0.3  PROT 7.1  ALBUMIN 3.0*    Coagulation Profile:  Recent Labs Lab 11/29/16 1636  INR 1.93    Recent Results (from the past 240 hour(s))  MRSA PCR Screening     Status: Abnormal   Collection Time: 11/30/16  4:10 AM  Result Value Ref Range Status   MRSA by PCR POSITIVE (A) NEGATIVE Final    Comment:        The GeneXpert MRSA Assay (FDA approved for NASAL specimens only), is one component  of a comprehensive MRSA colonization surveillance program. It is not intended to diagnose MRSA infection nor to guide or monitor treatment for MRSA infections. RESULT CALLED TO, READ BACK BY AND VERIFIED WITH: C.WOODARD, RN 11/30/16 0636 L.CHAMPION      Scheduled Meds: . acetylcysteine  4 mL Nebulization BID  . fluticasone  1 spray Each Nare Daily  . levalbuterol  0.63 mg Nebulization QID  . pantoprazole (PROTONIX) IV  40 mg Intravenous Q24H  . sodium chloride HYPERTONIC  4 mL Nebulization BID  . tiotropium  18 mcg Inhalation Daily     LOS: 2 days   Cherene Altes, MD [facility] Office  [phone] Pager - Text Page per Amion as per below:  On-Call/Text Page:      [email or web address]      password [email or web address]  If 7PM-7AM, please contact night-coverage [email or web address] Password [email or web address] 12/01/2016, 10:28 AM

## 2016-12-01 NOTE — Progress Notes (Signed)
[facility] PCCM  S: feels OK  O:  Vitals:   12/01/16 1300 12/01/16 1400 12/01/16 1500 12/01/16 1514  BP: (!) 109/47 (!) 113/53 (!) 113/59   Pulse: 73 73 75   Resp: 15     Temp:      TempSrc:      SpO2: 100% 99% 99% 96%  Weight:      Height:       General:  Resting comfortably in bed HENT: NCAT OP clear PULM: Poor air movement on leftB, normal effort CV: RRR, no mgr GI: BS+, soft, nontender MSK: normal bulk and tone Neuro: awake, alert, no distress, MAEW  CT chest : dense collapse of the left lung, likely airway mass in left mainstem bronchus  Impression/plan Lung collapse due to proximal obstruction: presumably this is due to a lung mass given her smoking history, hemoptysis, and lack of response to mucociliary clearance measures. > hold rivaroxaban > plan bronchoscopy 9/2 AM  Roselie Awkward, MD [place] [facility] Pager: [phone] Cell: [phone] After 3pm or if no response, call [phone]

## 2016-12-02 ENCOUNTER — Inpatient Hospital Stay (HOSPITAL_COMMUNITY): Payer: Medicare Other

## 2016-12-02 LAB — CBC
HEMATOCRIT: 25.7 % — AB (ref 36.0–46.0)
HEMOGLOBIN: 7.6 g/dL — AB (ref 12.0–15.0)
MCH: 23.9 pg — AB (ref 26.0–34.0)
MCHC: 29.6 g/dL — AB (ref 30.0–36.0)
MCV: 80.8 fL (ref 78.0–100.0)
Platelets: 339 10*3/uL (ref 150–400)
RBC: 3.18 MIL/uL — AB (ref 3.87–5.11)
RDW: 15.2 % (ref 11.5–15.5)
WBC: 7.2 10*3/uL (ref 4.0–10.5)

## 2016-12-02 LAB — GLUCOSE, CAPILLARY
GLUCOSE-CAPILLARY: 93 mg/dL (ref 65–99)
Glucose-Capillary: 86 mg/dL (ref 65–99)

## 2016-12-02 LAB — PROTIME-INR
INR: 1.01
PROTHROMBIN TIME: 13.2 s (ref 11.4–15.2)

## 2016-12-02 LAB — VANCOMYCIN, TROUGH: Vancomycin Tr: 22 ug/mL (ref 15–20)

## 2016-12-02 MED ORDER — FENTANYL CITRATE (PF) 100 MCG/2ML IJ SOLN
50.0000 ug | Freq: Once | INTRAMUSCULAR | Status: AC
  Administered 2016-12-02: 50 ug via INTRAVENOUS

## 2016-12-02 MED ORDER — LIDOCAINE HCL (PF) 1 % IJ SOLN
INTRAMUSCULAR | Status: AC
  Administered 2016-12-02: 5 mL
  Filled 2016-12-02: qty 5

## 2016-12-02 MED ORDER — SODIUM CHLORIDE 0.9 % IV BOLUS (SEPSIS)
500.0000 mL | Freq: Once | INTRAVENOUS | Status: AC
  Administered 2016-12-02: 500 mL via INTRAVENOUS

## 2016-12-02 MED ORDER — MIDAZOLAM HCL 2 MG/2ML IJ SOLN
INTRAMUSCULAR | Status: AC
  Administered 2016-12-02: 3.5 mg via INTRAVENOUS
  Filled 2016-12-02: qty 4

## 2016-12-02 MED ORDER — MIDAZOLAM HCL 2 MG/2ML IJ SOLN
3.5000 mg | Freq: Once | INTRAMUSCULAR | Status: AC
  Administered 2016-12-02: 3.5 mg via INTRAVENOUS

## 2016-12-02 MED ORDER — FENTANYL CITRATE (PF) 100 MCG/2ML IJ SOLN
INTRAMUSCULAR | Status: AC
  Administered 2016-12-02: 50 ug via INTRAVENOUS
  Filled 2016-12-02: qty 4

## 2016-12-02 MED ORDER — VANCOMYCIN HCL IN DEXTROSE 1-5 GM/200ML-% IV SOLN
1000.0000 mg | INTRAVENOUS | Status: DC
  Administered 2016-12-02 – 2016-12-03 (×2): 1000 mg via INTRAVENOUS
  Filled 2016-12-02 (×3): qty 200

## 2016-12-02 NOTE — Progress Notes (Signed)
[place] TEAM 1 - Stepdown/ICU TEAM  Paula Contreras  YSA:[id number] DOB: 1933/05/31 DOA: 11/29/2016 PCP: Josetta Huddle, MD    Brief Narrative:  83 y.o. female with a history of COPD and paroxysmal atrial fibrillation who presented to the ER with complaints of hemoptysis.  Subjective: The patient was moved to the ICU by PCCM to undergo bronchoscopy today.    Assessment & Plan:  Acute hypoxic respiratory failure with scant hemoptysis - L lung collapse  Did not improve w/ conservative tx - for bronchoscopy today    Chronic Parox A. Fib rate is controlled without any medications - Xarelto on hold due to hemopytis and pending bronch - CHA2DS2-VASc is 2  COPD Well compensated at this time  Anemia, normocytic normochromic Not hemodynamically significant at present - follow in serial fashion - slowly drifting downward toward transfusion threshold of 6.9  MRSA screen +  DVT prophylaxis: SCDs Code Status: FULL CODE Family Communication: no family present at time of exam  Disposition Plan: await definitive tx per PCCM  Consultants:  PCCM  Procedures: none  Antimicrobials:  Cefepime 8/30 > Vancomycin 8/30 >  Objective: Blood pressure (!) 102/43, pulse (!) 58, temperature (!) 97.3 F (36.3 C), temperature source Oral, resp. rate 17, height 5\' 7"  (1.702 m), weight 59 kg (130 lb), SpO2 100 %.  Intake/Output Summary (Last 24 hours) at 12/02/16 0850 Last data filed at 12/02/16 0600  Gross per 24 hour  Intake              170 ml  Output              450 ml  Net             -280 ml   Filed Weights   11/29/16 1900  Weight: 59 kg (130 lb)    Examination: No exam today - pt under care of PCCM in ICU  CBC:  Recent Labs Lab 11/29/16 1636 11/30/16 0323 12/01/16 0557 12/02/16 0239  WBC 8.5 8.8 7.7 7.2  NEUTROABS 6.9  --   --   --   HGB 8.4* 7.8* 7.9* 7.6*  HCT 28.4* 27.1* 27.2* 25.7*  MCV 81.8 81.6 81.4 80.8  PLT 380 357 332 323   Basic Metabolic Panel:  Recent  Labs Lab 11/29/16 1636 11/30/16 0323 12/01/16 0557  NA 138 138 138  K 4.3 4.1 4.2  CL 98* 99* 100*  CO2 34* 31 32  GLUCOSE 97 90 92  BUN 17 19 15   CREATININE 1.15* 1.05* 1.10*  CALCIUM 10.7* 9.9 9.1   GFR: Estimated Creatinine Clearance: 37.4 mL/min (A) (by C-G formula based on SCr of 1.1 mg/dL (H)).  Liver Function Tests:  Recent Labs Lab 11/29/16 1636  AST 17  ALT 10*  ALKPHOS 60  BILITOT 0.3  PROT 7.1  ALBUMIN 3.0*    Coagulation Profile:  Recent Labs Lab 11/29/16 1636  INR 1.93    Recent Results (from the past 240 hour(s))  MRSA PCR Screening     Status: Abnormal   Collection Time: 11/30/16  4:10 AM  Result Value Ref Range Status   MRSA by PCR POSITIVE (A) NEGATIVE Final    Comment:        The GeneXpert MRSA Assay (FDA approved for NASAL specimens only), is one component of a comprehensive MRSA colonization surveillance program. It is not intended to diagnose MRSA infection nor to guide or monitor treatment for MRSA infections. RESULT CALLED TO, READ BACK BY AND VERIFIED WITH: C.WOODARD,  RN 11/30/16 0636 L.CHAMPION      Scheduled Meds: . acetylcysteine  4 mL Nebulization BID  . fluticasone  1 spray Each Nare Daily  . levalbuterol  0.63 mg Nebulization QID  . pantoprazole (PROTONIX) IV  40 mg Intravenous Q24H  . sodium chloride HYPERTONIC  4 mL Nebulization BID  . tiotropium  18 mcg Inhalation Daily     LOS: 3 days   Cherene Altes, MD [facility] Office  [phone] Pager - Text Page per Amion as per below:  On-Call/Text Page:      [email or web address]      password [email or web address]  If 7PM-7AM, please contact night-coverage [email or web address] Password [email or web address] 12/02/2016, 8:50 AM

## 2016-12-02 NOTE — Procedures (Signed)
Bedside Bronchoscopy Procedure Note Paula Contreras [id number] 1933-05-11  Procedure: Bronchoscopy Indications: Diagnostic evaluation of the airways  Procedure Details: ET Tube Size: ET Tube secured at lip (cm): Bite block in place: Yes In preparation for procedure, Patient hyper-oxygenated with 50 % FiO2 Airway entered and the following bronchi were examined: LUL.   Bronchoscope removed.    Evaluation BP (!) 102/43   Pulse (!) 58   Temp (!) 97.5 F (36.4 C) (Oral)   Resp 17   Ht 5\' 7"  (1.702 m) Comment: Pt reported  Wt 130 lb (59 kg) Comment: Pt reported  SpO2 100%   BMI 20.36 kg/m  Breath Sounds:Diminished O2 sats: stable throughout Patient's Current Condition: stable Specimens:  None Complications: No apparent complications Patient did tolerate procedure well.  Pt placed on 50% venti mask from [place] due to drop in sats  Around 88%.  Sats increased to 98%.  RT will continue to monitor.   [name] 12/02/2016, 11:04 AM

## 2016-12-02 NOTE — Progress Notes (Signed)
Name: Paula Contreras MRN: [id number] DOB: 26-May-1933    ADMISSION DATE:  11/29/2016 CONSULTATION DATE:  11/29/2016  REFERRING MD :  Dr. Laverta Baltimore  CHIEF COMPLAINT:  Hemoptysis   HISTORY OF PRESENT ILLNESS:   83 year old female with PMH of PAF on Xarelto, COPD on 3L home O2, former smoker- quit 6 years ago, AS, diastolic HF, HTN, and CKD stage III who presented on 8/30 with hemopytsis.  SUBJECTIVE:  Transferred to ICU yesterday. No change in resp status overnight.    VITAL SIGNS: Temp:  [97.3 F (36.3 C)-98.7 F (37.1 C)] 97.3 F (36.3 C) (09/02 0348) Pulse Rate:  [58-153] 58 (09/02 0700) Resp:  [9-19] 17 (09/02 0700) BP: (95-133)/(39-120) 102/43 (09/02 0700) SpO2:  [95 %-100 %] 100 % (09/02 0700)  Blood pressure (!) 102/43, pulse (!) 58, temperature (!) 97.3 F (36.3 C), temperature source Oral, resp. rate 17, height 5\' 7"  (1.702 m), weight 130 lb (59 kg), SpO2 100 %. Gen:      No acute distress HEENT:  EOMI, sclera anicteric Neck:     No masses; no thyromegaly Lungs:    B/L exp wheeze; normal respiratory effort CV:         Regular rate and rhythm; no murmurs Abd:      + bowel sounds; soft, non-tender; no palpable masses, no distension Ext:    No edema; adequate peripheral perfusion Skin:      Warm and dry; no rash Neuro: alert and oriented x 3 Psych: normal mood and affect   Recent Labs Lab 11/29/16 1636 11/30/16 0323 12/01/16 0557  NA 138 138 138  K 4.3 4.1 4.2  CL 98* 99* 100*  CO2 34* 31 32  BUN 17 19 15   CREATININE 1.15* 1.05* 1.10*  GLUCOSE 97 90 92    Recent Labs Lab 11/30/16 0323 12/01/16 0557 12/02/16 0239  HGB 7.8* 7.9* 7.6*  HCT 27.1* 27.2* 25.7*  WBC 8.8 7.7 7.2  PLT 357 332 339   Dg Chest Port 1 View  Result Date: 12/01/2016 CLINICAL DATA:  Atelectasis. EXAM: PORTABLE CHEST 1 VIEW COMPARISON:  Chest x-ray 8/31 and 8/30.  CT of the chest 11/29/2016 FINDINGS: Persistent complete collapse of the left lung remains. The patient is rotated to the  left. Compensatory hyperinflation of the right lung is noted. Atherosclerotic changes are present at the aortic arch. IMPRESSION: 1. Ongoing collapse of the left lung with leftward mediastinal shift. Electronically Signed   By: San Morelle M.D.   On: 12/01/2016 07:49   STUDIES:  8/30 CXR >> leftward mediastinal shift w/deviation of the carina to midline of left hemithorax;  large left pleural effusion and opacity of the left hemithorax, possible left lung collapse; clear right lung 8/30 CT chest >> subtotal atelectasis of left lung with extensive fluid/mucous throughout Left mainstem bronchus, left lower lobe bronchus and extending into subsegmental bronchi in both upper and lower lobes with minimal residual aeration of left upper lobe; a discrete obstructing mass is not visualized but not excluded; associated small left pleural effusion; underlying COPD changes right lung; extensive atherosclerotic disease including coronary artery calcifications; small hiatal hernia; aortic atherosclerosis and emphysema  9/2 > CXR. Persistent collapse of lt lung with lt mediastinal shift.   SIGNIFICANT EVENTS  8/30 Admit to [facility]  ANTIBIOTICS 8/30 vancomycin >> 8/30 cefepime >>  BRIEF PATIENT DESCRIPTION:  66 yoF from SNF w/PMH of COPD on 3L O2, PAF on Xarelto, dCHF, HTN presenting with one day history of hemoptysis with  Hgb 8.4 with CT chest showing subtotal atelectasis of left lung with mediastinal shift to left with small pleural effusion.  No hypoxemia or increased WOB.  Admitted to SDU by [place].  ASSESSMENT / PLAN:  Hemoptysis with LEFT subtotal atelectasis Small left effusion R/o HCAP Chronic respiratory failure in setting of COPD H/o PAF on Xarelto   Had mediastinal shift to the left, w/concern for endobronchial obstruction  - ddx infection vs malignancy vs mucous plugging  CXR today reviewed 9/2 > it shows persistent collapse.  Plan Continue abx coverage Anticoagulants are on  hold Wean O2 Add Flutter/vibravest, Hypertonic saline and mucomyst Plan for bedside bronch today for airway inspection and mucus clearance.   Marshell Garfinkel MD [facility] Pager [phone] If no answer or after 3pm call: [phone] 12/02/2016, 7:25 AM

## 2016-12-02 NOTE — Procedures (Signed)
PCCM Video Bronchoscopy Procedure Note  The patient was informed of the risks (including but not limited to bleeding, infection, respiratory failure, lung injury, tooth/oral injury) and benefits of the procedure and gave consent, see chart.  Indication: left lung collapse  Post Procedure Diagnosis: Lung mass completely obstructing the left mainstem bronchus  Location: [facility] [phone]  Condition pre procedure: stable  Medications for procedure: versed, fentanyl IV push  Procedure description: The bronchoscope was introduced through the left nare and passed to the bilateral lungs to the level of the subsegmental bronchi throughout the tracheobronchial tree.  Airway exam revealed tortuous trachea, sharp carina, tracheobronchial tree on the left was normal.  However the left mainstem bronchus was completely occluded by a fungating mass.  See photo below.  Procedures performed: none  Specimens sent: none  Condition post procedure: Stable  EBL: none  Complications: none immediate  Plan: Needs bronchoscopy in the bronchoscopy suite for biopsy, brushing  Roselie Awkward, MD [place] [facility] Pager: [phone] Cell: [phone] After 3pm or if no response, call [place]    12/02/2016 10:48  Attached To:  Hospital Encounter on 11/29/16  Source Information   Marshell Garfinkel, MD  [facility]

## 2016-12-02 NOTE — Progress Notes (Signed)
[place] pulmonary critical care  I updated the patient and the patient's niece about the findings of a large lung mass completely occluding the left mainstem bronchus. They voiced understanding. I explained that we needed to perform another bronchoscopy this hospitalization for biopsy and brushing. This will be tentatively scheduled for Tuesday.  Roselie Awkward, MD [place] [facility] Pager: [phone] Cell: [phone] After 3pm or if no response, call [phone]

## 2016-12-02 NOTE — Progress Notes (Signed)
Pharmacy Antibiotic Note  Paula Contreras is a 83 y.o. female admitted on 11/29/2016 with hemoptysis.  Pharmacy has been consulted for vancomycin and cefepime dosing for PNA/HCAP.   Scr 1.1, CrCl ~ 64ml/min. Low uop. Afebrile this am, wbc normal. No culture data.  Vancomycin trough is slightly high at 22 but drawn early, true trough likely at upper end of range. Given her age she is likely to accumulate bid dosing, will delay next dose to allow her to clear current dosing and change to daily which should give her a new trough closer to 15.   Plan: Vancomycin 1g q24 hours Cefepime 2g IV Q24h Monitor clinical progression, deescalation, and LOT.   Height: 5\' 7"  (170.2 cm) (Pt reported) Weight: 130 lb (59 kg) (Pt reported) IBW/kg (Calculated) : 61.6  Temp (24hrs), Avg:97.9 F (36.6 C), Min:97.3 F (36.3 C), Max:98.7 F (37.1 C)   Recent Labs Lab 11/29/16 1636 11/30/16 0323 12/01/16 0557 12/02/16 0239 12/02/16 0634  WBC 8.5 8.8 7.7 7.2  --   CREATININE 1.15* 1.05* 1.10*  --   --   VANCOTROUGH  --   --   --   --  22*    Estimated Creatinine Clearance: 37.4 mL/min (A) (by C-G formula based on SCr of 1.1 mg/dL (H)).    Allergies  Allergen Reactions  . Codeine Nausea And Vomiting  . Robitussin (Alcohol Free) [Guaifenesin] Other (See Comments)    Per MAR   Erin Hearing PharmD., BCPS Clinical Pharmacist Pager [phone] 12/02/2016 8:30 AM

## 2016-12-03 ENCOUNTER — Inpatient Hospital Stay (HOSPITAL_COMMUNITY): Payer: Medicare Other

## 2016-12-03 DIAGNOSIS — J9601 Acute respiratory failure with hypoxia: Secondary | ICD-10-CM

## 2016-12-03 DIAGNOSIS — J189 Pneumonia, unspecified organism: Secondary | ICD-10-CM

## 2016-12-03 DIAGNOSIS — D649 Anemia, unspecified: Secondary | ICD-10-CM

## 2016-12-03 DIAGNOSIS — J441 Chronic obstructive pulmonary disease with (acute) exacerbation: Secondary | ICD-10-CM

## 2016-12-03 LAB — CBC WITH DIFFERENTIAL/PLATELET
BASOS ABS: 0.1 10*3/uL (ref 0.0–0.1)
BASOS PCT: 1 %
Eosinophils Absolute: 0.4 10*3/uL (ref 0.0–0.7)
Eosinophils Relative: 5 %
HEMATOCRIT: 27.4 % — AB (ref 36.0–46.0)
HEMOGLOBIN: 7.8 g/dL — AB (ref 12.0–15.0)
Lymphocytes Relative: 12 %
Lymphs Abs: 0.9 10*3/uL (ref 0.7–4.0)
MCH: 23.6 pg — ABNORMAL LOW (ref 26.0–34.0)
MCHC: 28.5 g/dL — ABNORMAL LOW (ref 30.0–36.0)
MCV: 83 fL (ref 78.0–100.0)
Monocytes Absolute: 0.7 10*3/uL (ref 0.1–1.0)
Monocytes Relative: 9 %
NEUTROS ABS: 5.4 10*3/uL (ref 1.7–7.7)
NEUTROS PCT: 73 %
Platelets: 344 10*3/uL (ref 150–400)
RBC: 3.3 MIL/uL — ABNORMAL LOW (ref 3.87–5.11)
RDW: 15.5 % (ref 11.5–15.5)
WBC: 7.4 10*3/uL (ref 4.0–10.5)

## 2016-12-03 LAB — BASIC METABOLIC PANEL
ANION GAP: 8 (ref 5–15)
BUN: 10 mg/dL (ref 6–20)
CALCIUM: 8.9 mg/dL (ref 8.9–10.3)
CO2: 29 mmol/L (ref 22–32)
Chloride: 102 mmol/L (ref 101–111)
Creatinine, Ser: 1.04 mg/dL — ABNORMAL HIGH (ref 0.44–1.00)
GFR calc Af Amer: 57 mL/min — ABNORMAL LOW (ref >60)
GFR calc non Af Amer: 49 mL/min — ABNORMAL LOW (ref >60)
Glucose, Bld: 90 mg/dL (ref 65–99)
POTASSIUM: 4.5 mmol/L (ref 3.5–5.1)
Sodium: 139 mmol/L (ref 135–145)

## 2016-12-03 LAB — GLUCOSE, CAPILLARY
GLUCOSE-CAPILLARY: 88 mg/dL (ref 65–99)
GLUCOSE-CAPILLARY: 89 mg/dL (ref 65–99)
Glucose-Capillary: 100 mg/dL — ABNORMAL HIGH (ref 65–99)
Glucose-Capillary: 97 mg/dL (ref 65–99)

## 2016-12-03 LAB — MAGNESIUM: MAGNESIUM: 1.9 mg/dL (ref 1.7–2.4)

## 2016-12-03 LAB — PROCALCITONIN

## 2016-12-03 LAB — PREPARE RBC (CROSSMATCH)

## 2016-12-03 LAB — PROTIME-INR
INR: 1.03
Prothrombin Time: 13.4 seconds (ref 11.4–15.2)

## 2016-12-03 MED ORDER — PHENYLEPHRINE HCL 0.25 % NA SOLN
1.0000 | Freq: Four times a day (QID) | NASAL | Status: DC | PRN
  Filled 2016-12-03: qty 15

## 2016-12-03 MED ORDER — SODIUM CHLORIDE 0.9 % IV SOLN
Freq: Once | INTRAVENOUS | Status: AC
  Administered 2016-12-03: 16:00:00 via INTRAVENOUS

## 2016-12-03 MED ORDER — IOPAMIDOL (ISOVUE-300) INJECTION 61%: 15.0000 mL | INTRAVENOUS | Status: AC

## 2016-12-03 MED ORDER — BUTAMBEN-TETRACAINE-BENZOCAINE 2-2-14 % EX AERO
1.0000 | INHALATION_SPRAY | Freq: Once | CUTANEOUS | Status: DC
  Filled 2016-12-03: qty 20

## 2016-12-03 MED ORDER — IOPAMIDOL (ISOVUE-300) INJECTION 61%
INTRAVENOUS | Status: AC
  Administered 2016-12-03: 100 mL via INTRAVENOUS
  Filled 2016-12-03: qty 100

## 2016-12-03 MED ORDER — LIDOCAINE HCL 2 % EX GEL
1.0000 "application " | Freq: Once | CUTANEOUS | Status: DC
  Filled 2016-12-03: qty 5

## 2016-12-03 NOTE — Progress Notes (Signed)
PROGRESS NOTE    Paula Contreras  JWJ:[id number] DOB: 08-Jul-1933 DOA: 11/29/2016 PCP: Josetta Huddle, MD   Brief Narrative:  83 y.o. WF PMHx Dementia, COPD (on home O2), HTN, Paroxysmal Atrial Fibrillation, Aortic Stenosis, Bradycardia, CKD stage III,   Presents to the ER with complaints of hemoptysis. Patient states that she has been coughing up blood since morning. Denies any chest pain shortness of breath fever chills.     Subjective: 9/3 A/O 4, extremely hard of hearing, negative CP, positive SOB, negative abdominal pain, negative N/V   Assessment & Plan:   Active Problems:   COPD (chronic obstructive pulmonary disease) (HCC)   Aortic stenosis   PAF (paroxysmal atrial fibrillation) (HCC)   CKD (chronic kidney disease), stage III   Acute on chronic respiratory failure with hypoxia (HCC)   Diastolic dysfunction   Acute respiratory failure (HCC)   Atelectasis   Acute hypoxic respiratory failure with scant hemoptysis  -LEFT lung collapsed secondary to mass in mainstem bronchus  -Did not improve w/ conservative tx  -Bronchoscopy revealed large fungating mass left mainstem bronchus.  -Titrate O2 to maintain SPO2 89 - 93%    COPD exacerbation/Emphysema/HCAP -See acute hypoxic respiratory failure  -Xopenex QID  -Spiriva daily -Completely obstructed left mainstem bronchus, although does not appear to be infectious given her complete obstruction collapsed lung continue current antibiotics -Patient for bronchoscopy on 9/4 obtain BAL for culture and stain  Large left main stem bronchus mass (lung cancer) -PCCM to perform second bronchoscopy for biopsy on Tuesday 9/4   Parox A. Fib(CHA2DS2-VASc is 2) -Currently rate controlled - Xarelto on hold due to hemopytis and pending bronchoscopy - Transfuse for hemoglobin<8 -9/3 transfuse 1 unit PRBC   Anemia, normocytic normochromic -Not hemodynamically significant at present  -Occult blood pending      DVT prophylaxis:  SCD Code Status: DO NOT RESUSCITATE Family Communication: None Disposition Plan:  TBD:   Consultants:  [facility]    Procedures/Significant Events:  9/2 Bronchoscopy: Large fungating lung mass obstructing LEFT mainstem bronchus. No biopsy     I have personally reviewed and interpreted all radiology studies and my findings are as above.  VENTILATOR SETTINGS: None   Cultures 8/31 MRSA by PCR positive   Antimicrobials: Anti-infectives    Start     Stop   12/02/16 1400  vancomycin (VANCOCIN) IVPB 1000 mg/200 mL premix         11/30/16 2000  ceFEPIme (MAXIPIME) 2 g in dextrose 5 % 50 mL IVPB         11/30/16 0800  vancomycin (VANCOCIN) 500 mg in sodium chloride 0.9 % 100 mL IVPB  Status:  Discontinued     12/02/16 0822   11/29/16 1900  ceFEPIme (MAXIPIME) 2 g in dextrose 5 % 50 mL IVPB     11/29/16 1935   11/29/16 1900  vancomycin (VANCOCIN) IVPB 1000 mg/200 mL premix     11/29/16 2048   11/29/16 0800  vancomycin (VANCOCIN) 500 mg in sodium chloride 0.9 % 100 mL IVPB  Status:  Discontinued     11/29/16 1929       Devices None   LINES / TUBES:  None    Continuous Infusions: . ceFEPime (MAXIPIME) IV Stopped (12/02/16 2220)  . vancomycin Stopped (12/02/16 1423)     Objective: Vitals:   12/02/16 2110 12/02/16 2132 12/03/16 0400 12/03/16 0751  BP: (!) 94/41   (!) 109/55  Pulse: 96   69  Resp: 19   18  Temp: 98.1  F (36.7 C)   97.8 F (36.6 C)  TempSrc:    Oral  SpO2: 100% 96%  99%  Weight:   141 lb 11.2 oz (64.3 kg)   Height:        Intake/Output Summary (Last 24 hours) at 12/03/16 0840 Last data filed at 12/03/16 0400  Gross per 24 hour  Intake              610 ml  Output              200 ml  Net              410 ml   Filed Weights   11/29/16 1900 12/02/16 1721 12/03/16 0400  Weight: 130 lb (59 kg) 144 lb 12.8 oz (65.7 kg) 141 lb 11.2 oz (64.3 kg)    Examination:  General: A/O 4, positive acute on chronic respiratory distress Neck:   Negative scars, masses, torticollis, lymphadenopathy, JVD Lungs: right lung clear to auscultation (coarse breath sounds), left lung fields little to no breath sounds positive expiratory wheeze, negative crackles  Cardiovascular: Irregular irregular rhythm and rate, without murmur gallop or rub normal S1 and S2 Abdomen: negative abdominal pain, nondistended, positive soft, bowel sounds, no rebound, no ascites, no appreciable mass Extremities: No significant cyanosis, clubbing, or edema bilateral lower extremities Skin: Negative rashes, lesions, ulcers Psychiatric:  Negative depression, negative anxiety, negative fatigue, negative mania  Central nervous system:  Cranial nerves II through XII intact, tongue/uvula midline, all extremities muscle strength 5/5, sensation intact throughout, fnegative dysarthria, negative expressive aphasia, negative receptive aphasia.  .     Data Reviewed: Care during the described time interval was provided by me .  I have reviewed this patient's available data, including medical history, events of note, physical examination, and all test results as part of my evaluation.   CBC:  Recent Labs Lab 11/29/16 1636 11/30/16 0323 12/01/16 0557 12/02/16 0239  WBC 8.5 8.8 7.7 7.2  NEUTROABS 6.9  --   --   --   HGB 8.4* 7.8* 7.9* 7.6*  HCT 28.4* 27.1* 27.2* 25.7*  MCV 81.8 81.6 81.4 80.8  PLT 380 357 332 454   Basic Metabolic Panel:  Recent Labs Lab 11/29/16 1636 11/30/16 0323 12/01/16 0557  NA 138 138 138  K 4.3 4.1 4.2  CL 98* 99* 100*  CO2 34* 31 32  GLUCOSE 97 90 92  BUN _0 CREATININE 1.15* 1.05* 1.10*  CALCIUM 10.7* 9.9 9.1   GFR: Estimated Creatinine Clearance: 37.5 mL/min (A) (by C-G formula based on SCr of 1.1 mg/dL (H)). Liver Function Tests:  Recent Labs Lab 11/29/16 1636  AST 17  ALT 10*  ALKPHOS 60  BILITOT 0.3  PROT 7.1  ALBUMIN 3.0*   No results for input(s): LIPASE, AMYLASE in the last 168 hours. No results for  input(s): AMMONIA in the last 168 hours. Coagulation Profile:  Recent Labs Lab 11/29/16 1636 12/02/16 0826  INR 1.93 1.01   Cardiac Enzymes: No results for input(s): CKTOTAL, CKMB, CKMBINDEX, TROPONINI in the last 168 hours. BNP (last 3 results) No results for input(s): PROBNP in the last 8760 hours. HbA1C: No results for input(s): HGBA1C in the last 72 hours. CBG:  Recent Labs Lab 12/01/16 1215 12/01/16 2339 12/02/16 0604 12/02/16 1200 12/03/16 0607  GLUCAP 91 109* 86 93 88   Lipid Profile: No results for input(s): CHOL, HDL, LDLCALC, TRIG, CHOLHDL, LDLDIRECT in the last 72 hours. Thyroid Function Tests: No results  for input(s): TSH, T4TOTAL, FREET4, T3FREE, THYROIDAB in the last 72 hours. Anemia Panel: No results for input(s): VITAMINB12, FOLATE, FERRITIN, TIBC, IRON, RETICCTPCT in the last 72 hours. Urine analysis:    Component Value Date/Time   COLORURINE AMBER (A) 02/24/2016 1650   APPEARANCEUR CLEAR 02/24/2016 1650   LABSPEC 1.017 02/24/2016 1650   PHURINE 7.5 02/24/2016 1650   GLUCOSEU NEGATIVE 02/24/2016 1650   HGBUR NEGATIVE 02/24/2016 1650   BILIRUBINUR SMALL (A) 02/24/2016 1650   KETONESUR NEGATIVE 02/24/2016 1650   PROTEINUR 30 (A) 02/24/2016 1650   UROBILINOGEN 0.2 01/11/2015 2337   NITRITE NEGATIVE 02/24/2016 1650   LEUKOCYTESUR NEGATIVE 02/24/2016 1650   Sepsis Labs: _0 (procalcitonin:4,lacticidven:4)  ) Recent Results (from the past 240 hour(s))  MRSA PCR Screening     Status: Abnormal   Collection Time: 11/30/16  4:10 AM  Result Value Ref Range Status   MRSA by PCR POSITIVE (A) NEGATIVE Final    Comment:        The GeneXpert MRSA Assay (FDA approved for NASAL specimens only), is one component of a comprehensive MRSA colonization surveillance program. It is not intended to diagnose MRSA infection nor to guide or monitor treatment for MRSA infections. RESULT CALLED TO, READ BACK BY AND VERIFIED WITH: C.WOODARD, RN 11/30/16 0636  L.CHAMPION          Radiology Studies: Dg Chest Port 1 View  Result Date: 12/02/2016 CLINICAL DATA:  Follow-up complete atelectasis of the left lung and left pleural effusion. EXAM: PORTABLE CHEST 1 VIEW COMPARISON:  12/01/2016, 11/30/2016 and earlier, including CT chest 11/29/2016. FINDINGS: Complete opacification of the left hemithorax, shown on the CT to represent a combination of complete left lung atelectasis and left pleural effusion, unchanged. Compensatory hyperinflation of the right lung which remains clear. No right pleural effusion. Cardiac silhouette normal in size. Extensive thoracic aortic atherosclerosis. IMPRESSION: 1. Stable complete opacification of left hemithorax shown on the recent CT to represent a combination of left lung atelectasis and left pleural effusion. 2. No acute disease involving the right lung. 3.  Aortic Atherosclerosis (ICD10-170.0) Electronically Signed   By: Evangeline Dakin M.D.   On: 12/02/2016 07:27        Scheduled Meds: . fluticasone  1 spray Each Nare Daily  . levalbuterol  0.63 mg Nebulization QID  . pantoprazole (PROTONIX) IV  40 mg Intravenous Q24H  . tiotropium  18 mcg Inhalation Daily   Continuous Infusions: . ceFEPime (MAXIPIME) IV Stopped (12/02/16 2220)  . vancomycin Stopped (12/02/16 1423)     LOS: 4 days    Time spent: 40 minutes    Reshunda Strider, Geraldo Docker, MD [facility] Pager [phone]   If 7PM-7AM, please contact night-coverage [email or web address] Password [email or web address] 12/03/2016, 8:40 AM

## 2016-12-03 NOTE — Progress Notes (Signed)
RT NOTE:  Pt refused CPT via vest tonight. Pt stated "it does not do nothing for me." RT explained to patient why we are using CPT and she continued to refuse.

## 2016-12-04 ENCOUNTER — Encounter (HOSPITAL_COMMUNITY)
Admission: EM | Disposition: A | Discharge status: Nursing Home (Includes ICF) | Payer: Self-pay | Source: Home / Self Care | Attending: Internal Medicine

## 2016-12-04 ENCOUNTER — Inpatient Hospital Stay (HOSPITAL_COMMUNITY): Payer: Medicare Other

## 2016-12-04 DIAGNOSIS — R042 Hemoptysis: Secondary | ICD-10-CM

## 2016-12-04 DIAGNOSIS — N183 Chronic kidney disease, stage 3 (moderate): Secondary | ICD-10-CM

## 2016-12-04 DIAGNOSIS — R918 Other nonspecific abnormal finding of lung field: Secondary | ICD-10-CM

## 2016-12-04 HISTORY — PX: VIDEO BRONCHOSCOPY: SHX5072

## 2016-12-04 LAB — BPAM RBC
BLOOD PRODUCT EXPIRATION DATE: 201809252359
ISSUE DATE / TIME: 201809031802
Unit Type and Rh: 6200

## 2016-12-04 LAB — TYPE AND SCREEN
ABO/RH(D): A POS
ANTIBODY SCREEN: NEGATIVE
UNIT DIVISION: 0

## 2016-12-04 LAB — GLUCOSE, CAPILLARY
GLUCOSE-CAPILLARY: 130 mg/dL — AB (ref 65–99)
Glucose-Capillary: 91 mg/dL (ref 65–99)
Glucose-Capillary: 98 mg/dL (ref 65–99)

## 2016-12-04 SURGERY — VIDEO BRONCHOSCOPY WITHOUT FLUORO
Anesthesia: Moderate Sedation | Laterality: Bilateral

## 2016-12-04 MED ORDER — LIDOCAINE HCL 2 % EX GEL
CUTANEOUS | Status: DC | PRN
  Administered 2016-12-04: 1

## 2016-12-04 MED ORDER — PHENYLEPHRINE HCL 0.25 % NA SOLN
NASAL | Status: DC | PRN
  Administered 2016-12-04: 2 via NASAL

## 2016-12-04 MED ORDER — PANTOPRAZOLE SODIUM 40 MG PO TBEC
40.0000 mg | DELAYED_RELEASE_TABLET | Freq: Every day | ORAL | Status: DC
  Administered 2016-12-05 – 2016-12-07 (×3): 40 mg via ORAL
  Filled 2016-12-04 (×3): qty 1

## 2016-12-04 MED ORDER — FENTANYL CITRATE (PF) 100 MCG/2ML IJ SOLN
INTRAMUSCULAR | Status: AC
  Filled 2016-12-04: qty 4

## 2016-12-04 MED ORDER — VANCOMYCIN HCL IN DEXTROSE 1-5 GM/200ML-% IV SOLN
1000.0000 mg | INTRAVENOUS | Status: DC
  Administered 2016-12-04: 1000 mg via INTRAVENOUS
  Filled 2016-12-04: qty 200

## 2016-12-04 MED ORDER — LIDOCAINE HCL (PF) 1 % IJ SOLN
INTRAMUSCULAR | Status: DC | PRN
  Administered 2016-12-04: 6 mL

## 2016-12-04 MED ORDER — SODIUM CHLORIDE 0.9 % IV SOLN
INTRAVENOUS | Status: DC
  Administered 2016-12-04: 13:00:00 via INTRAVENOUS

## 2016-12-04 MED ORDER — MIDAZOLAM HCL 10 MG/2ML IJ SOLN
INTRAMUSCULAR | Status: DC | PRN
  Administered 2016-12-04: 1 mg via INTRAVENOUS
  Administered 2016-12-04: 2 mg via INTRAVENOUS
  Administered 2016-12-04: 1 mg via INTRAVENOUS

## 2016-12-04 MED ORDER — FENTANYL CITRATE (PF) 100 MCG/2ML IJ SOLN
INTRAMUSCULAR | Status: DC | PRN
  Administered 2016-12-04: 25 ug via INTRAVENOUS
  Administered 2016-12-04: 50 ug via INTRAVENOUS
  Administered 2016-12-04: 25 ug via INTRAVENOUS

## 2016-12-04 MED ORDER — EPINEPHRINE PF 1 MG/ML IJ SOLN
INTRAMUSCULAR | Status: DC | PRN
  Administered 2016-12-04: 3 mL

## 2016-12-04 MED ORDER — MIDAZOLAM HCL 5 MG/ML IJ SOLN
INTRAMUSCULAR | Status: AC
  Filled 2016-12-04: qty 2

## 2016-12-04 NOTE — Progress Notes (Addendum)
[facility] TEAM 1 - Stepdown/ICU TEAM  Paula Contreras  WUJ:[id number] DOB: 12-Jun-1933 DOA: 11/29/2016 PCP: Josetta Huddle, MD    Brief Narrative:  83 y.o. female with a history of COPD and paroxysmal atrial fibrillation who presented to the ER with complaints of hemoptysis.  Subjective: S/p repeat bronchoscopy today, w/ biopsy of L mainstem bronchus mass.  Pt is resting in bed comfortably post-procedure.   She denies cp, sob, n/v, or abdom pain.     Assessment & Plan:  Acute hypoxic respiratory failure with scant hemoptysis Clinically stable w/ O2 support - no acute distress and no further hemoptysis  L lung collapse - L endobronchial nearly obstructing mass c/w CA S/p bx via bronch 9/4 - awaiting path results to discuss in detail w/ pt   Chronic Parox A. Fib rate is controlled without any medications - Xarelto on hold due to hemopytis and bronch bx - CHA2DS2-VASc is 2  COPD Well compensated at this time  Anemia, normocytic normochromic Not hemodynamically significant at present - transfusion threshold of 6.9  MRSA screen +  DVT prophylaxis: SCDs Code Status: DNR - NO CODE Family Communication: no family present at time of exam  Disposition Plan: await definitive tx per bx results   Consultants:  PCCM  Procedures: none  Antimicrobials:  Cefepime 8/30 > 9/3 Vancomycin 8/30 > 9/4  Objective: Blood pressure (!) 96/47, pulse (!) 102, temperature 98.5 F (36.9 C), temperature source Oral, resp. rate 12, height 5\' 6"  (1.676 m), weight 65.6 kg (144 lb 9.6 oz), SpO2 95 %.  Intake/Output Summary (Last 24 hours) at 12/04/16 1618 Last data filed at 12/04/16 0045  Gross per 24 hour  Intake           379.33 ml  Output              900 ml  Net          -520.67 ml   Filed Weights   12/02/16 1721 12/03/16 0400 12/04/16 0612  Weight: 65.7 kg (144 lb 12.8 oz) 64.3 kg (141 lb 11.2 oz) 65.6 kg (144 lb 9.6 oz)    Examination: General: No acute respiratory distress Lungs: no  air movement noted th/o L fields - CTA on R  Cardiovascular: Regular rate without murmur gallop or rub normal S1 and S2 Abdomen: Nontender, nondistended, soft, bowel sounds positive, no rebound, no ascites, no appreciable mass Extremities: No significant cyanosis, clubbing, or edema bilateral lower extremities   CBC:  Recent Labs Lab 11/29/16 1636 11/30/16 0323 12/01/16 0557 12/02/16 0239 12/03/16 0913  WBC 8.5 8.8 7.7 7.2 7.4  NEUTROABS 6.9  --   --   --  5.4  HGB 8.4* 7.8* 7.9* 7.6* 7.8*  HCT 28.4* 27.1* 27.2* 25.7* 27.4*  MCV 81.8 81.6 81.4 80.8 83.0  PLT 380 357 332 339 956   Basic Metabolic Panel:  Recent Labs Lab 11/29/16 1636 11/30/16 0323 12/01/16 0557 12/03/16 0913  NA 138 138 138 139  K 4.3 4.1 4.2 4.5  CL 98* 99* 100* 102  CO2 34* 31 32 29  GLUCOSE 97 90 92 90  BUN 17 19 15 10   CREATININE 1.15* 1.05* 1.10* 1.04*  CALCIUM 10.7* 9.9 9.1 8.9  MG  --   --   --  1.9   GFR: Estimated Creatinine Clearance: 39.7 mL/min (A) (by C-G formula based on SCr of 1.04 mg/dL (H)).  Liver Function Tests:  Recent Labs Lab 11/29/16 1636  AST 17  ALT 10*  ALKPHOS  60  BILITOT 0.3  PROT 7.1  ALBUMIN 3.0*    Coagulation Profile:  Recent Labs Lab 11/29/16 1636 12/02/16 0826 12/03/16 0913  INR 1.93 1.01 1.03    Recent Results (from the past 240 hour(s))  MRSA PCR Screening     Status: Abnormal   Collection Time: 11/30/16  4:10 AM  Result Value Ref Range Status   MRSA by PCR POSITIVE (A) NEGATIVE Final    Comment:        The GeneXpert MRSA Assay (FDA approved for NASAL specimens only), is one component of a comprehensive MRSA colonization surveillance program. It is not intended to diagnose MRSA infection nor to guide or monitor treatment for MRSA infections. RESULT CALLED TO, READ BACK BY AND VERIFIED WITH: C.WOODARD, RN 11/30/16 0636 L.CHAMPION      Scheduled Meds: . fluticasone  1 spray Each Nare Daily  . levalbuterol  0.63 mg Nebulization QID    . pantoprazole (PROTONIX) IV  40 mg Intravenous Q24H  . tiotropium  18 mcg Inhalation Daily     LOS: 5 days   Cherene Altes, MD [facility] Office  [phone] Pager - Text Page per Amion as per below:  On-Call/Text Page:      [email or web address]      password [email or web address]  If 7PM-7AM, please contact night-coverage [email or web address] Password [email or web address] 12/04/2016, 4:18 PM

## 2016-12-04 NOTE — Progress Notes (Signed)
Video bronchoscopy performed.  Intervention bronchial brushing. Intervention bronchial biopsy.  No complications noted.  Will continue to monitor.   Baltazar Apo, MD, PhD 12/04/2016, 1:58 PM [facility] [phone] or if no answer [phone]

## 2016-12-04 NOTE — Op Note (Addendum)
[facility] Cardiopulmonary Patient Name: Paula Contreras Date: 12/04/2016 MRN: [id number] Attending MD: Collene Gobble , MD Date of Birth: 02-09-1934 CSN: Finalized Age: 83 Admit Type: Inpatient Gender: Female Procedure:            Bronchoscopy Indications:          Left mainstem mass Providers:            Collene Gobble, MD, Cherre Huger RRT, RCP, Phillis Knack                        RRT, RCP Referring MD:          Medicines:            Midazolam 4 mg IV, Fentanyl 100 mcg IV, Epinephrine 1                        mg/10 mL topical 3 mL, Lidocaine 1% applied to cords 8                        mL, Lidocaine 1% applied to the tracheobronchial tree 8                        mL Complications:        No immediate complications Estimated Blood Loss: Estimated blood loss: 20 mL. Procedure:            Pre-Anesthesia Assessment:                       - A History and Physical has been performed. Patient                        meds and allergies have been reviewed. The risks and                        benefits of the procedure and the sedation options and                        risks were discussed with the patient. All questions                        were answered and informed consent was obtained.                        Patient identification and proposed procedure were                        verified prior to the procedure by the physician in the                        procedure room. Mental Status Examination: alert and                        oriented. Airway Examination: normal oropharyngeal                        airway. Respiratory Examination: clear to auscultation                        and poor air movement. CV Examination: regular rate and  rhythm. ASA Grade Assessment: III - A patient with                        severe systemic disease. After reviewing the risks and                        benefits, the patient was deemed in satisfactory                    condition to undergo the procedure. The anesthesia plan                        was to use moderate sedation / analgesia (conscious                        sedation). Immediately prior to administration of                        medications, the patient was re-assessed for adequacy                        to receive sedatives. The heart rate, respiratory rate,                        oxygen saturations, blood pressure, adequacy of                        pulmonary ventilation, and response to care were                        monitored throughout the procedure. The physical status                        of the patient was re-assessed after the procedure.                       After obtaining informed consent, the bronchoscope was                        passed under direct vision. Throughout the procedure,                        the patient's blood pressure, pulse, and oxygen                        saturations were monitored continuously. the [id number]                        [id number] scope was introduced through the right nostril                        and advanced to the tracheobronchial tree. The                        procedure was accomplished with moderate difficulty due                        to excessive bleeding and the patient's coughing.  Successful completion of the procedure was aided by                        increasing the dose of sedation medication. Scope In: 1:17:19 PM Scope Out: 1:36:51 PM Findings:      The nasopharynx/oropharynx appears normal. The larynx appears normal.       The vocal cords appear normal. The subglottic space is normal. The       trachea is of normal caliber. The carina is sharp. The tracheobronchial       tree of the right lung was examined to at least the first subsegmental       level. Bronchial mucosa and anatomy in the right lung are normal; there       are no endobronchial lesions, and no secretions.      Left Lung  Abnormalities: A nearly obstructing (greater than 90%       obstructed) mass was found distally in the left mainstem bronchus. The       mass was large and endobronchial, exophytic, friable and fungating. The       lesion was not traversed.      Endobronchial biopsies of the mass were performed in the left mainstem       bronchus using a forceps and sent for histopathology examination. Two       samples were obtained.      Brushings of the mass were obtained in the left mainstem bronchus with a       cytology brush and sent for routine cytology. Two samples were obtained.      Further sampling was precluded due to bleeding and poor visualization,       also due to frequent coughing and patient discomfort. Impression:           - Left mainstem mass                       - Left mainstem mass biopsies and brushings Moderate Sedation:      Moderate (conscious) sedation was personally administered by the       endoscopist. The following parameters were monitored: oxygen saturation,       heart rate, blood pressure, respiratory rate, EKG, adequacy of pulmonary       ventilation, and response to care. Total physician intraservice time was       23 minutes. Recommendation:       - Await biopsy, brushing and cytology results. Procedure Code(s):    --- Professional ---                       [phone], Bronchoscopy, rigid or flexible, including                        fluoroscopic guidance, when performed; with bronchial                        or endobronchial biopsy(s), single or multiple sites                       67591, Bronchoscopy, rigid or flexible, including                        fluoroscopic guidance, when performed; with brushing or  protected brushings                       [id number], Moderate sedation services provided by the same                        physician or other qualified health care professional                        performing the diagnostic or therapeutic  service that                        the sedation supports, requiring the presence of an                        independent trained observer to assist in the                        monitoring of the patient's level of consciousness and                        physiological status; initial 15 minutes of                        intraservice time, patient age 108 years or older                       [phone], Moderate sedation services; each additional 15                        minutes intraservice time Diagnosis Code(s):    --- Professional ---                       R91.8, Other nonspecific abnormal finding of lung field CPT copyright 2016 [employer]. All rights reserved. The codes documented in this report are preliminary and upon coder review may  be revised to meet current compliance requirements. Collene Gobble, MD Collene Gobble, MD 12/04/2016 1:57:42 PM Number of Addenda: 0

## 2016-12-04 NOTE — NC FL2 (Signed)
[place] LEVEL OF CARE SCREENING TOOL     IDENTIFICATION  Patient Name: Paula Contreras Birthdate: 1934/01/26 Sex: female Admission Date (Current Location): 11/29/2016  [facility] and [place] Number:  Herbalist and Address:  [facility] [place]. [facility], [place] [address], [place], [place] [address]      Provider Number: [id number]  Attending Physician Name and Address:  Cherene Altes, MD  Relative Name and Phone Number:  [name] [phone]    Current Level of Care: Hospital Recommended Level of Care: [place] Prior Approval Number:    Date Approved/Denied:   PASRR Number:    Discharge Plan: Other (Comment) (ALF - [facility])    Current Diagnoses: Patient Active Problem List   Diagnosis Date Noted  . Atelectasis   . Acute respiratory failure ([place]) 11/29/2016  . Pressure injury of skin 03/10/2016  . Sepsis ([place]) 03/08/2016  . CAP (community acquired pneumonia) 03/08/2016  . Sepsis due to pneumonia ([place])   . COPD exacerbation ([place])   . Acute on chronic respiratory failure with hypoxia ([place])   . Paroxysmal atrial fibrillation (HCC)   . Diastolic dysfunction   . Abnormal LFTs   . Bile duct stone 02/25/2016  . Chronic anticoagulation - Xerelto 02/25/2016  . Ascending cholangitis due to choledocolithiasis 02/25/2016  . HCAP (healthcare-associated pneumonia) 02/24/2016  . LFTs abnormal 02/24/2016  . Epigastric pain 02/24/2016  . Nausea and vomiting 02/24/2016  . History of bradycardia 10/27/2015  . SOB (shortness of breath)   . Encounter for palliative care   . Hypernatremia 02/21/2015  . Altered mental status 02/20/2015  . Metabolic encephalopathy 69/62/9528  . Weakness generalized 02/20/2015  . Dehydration 02/20/2015  . Leukocytosis 02/20/2015  . Anemia 02/20/2015  . Bronchitis 02/20/2015  . Fall at nursing home 02/20/2015  . Bradycardia 04/09/2014  . Chronic respiratory failure ([place])   . Aortic stenosis    . PAF (paroxysmal atrial fibrillation) ([place])   . CKD (chronic kidney disease), stage III   . Aortic valve disorder 10/30/2013  . Undiagnosed cardiac murmurs 07/21/2013  . Hypertension 10/02/2010  . COPD (chronic obstructive pulmonary disease) ([place]) 10/02/2010    Orientation RESPIRATION BLADDER Height & Weight     Self, Time, Situation, Place  O2 (nasal cannula 2L) Incontinent, External catheter Weight: 144 lb 9.6 oz (65.6 kg) Height:  5\' 6"  (167.6 cm)  BEHAVIORAL SYMPTOMS/MOOD NEUROLOGICAL BOWEL NUTRITION STATUS      Continent Diet (please see DC summary)  AMBULATORY STATUS COMMUNICATION OF NEEDS Skin    (at baseline; not assessed by PT) Verbally Normal                       Personal Care Assistance Level of Assistance  Bathing, Feeding, Dressing (at baseline; not assessed by PT)           Functional Limitations Info  Sight, Hearing, Speech Sight Info: Adequate Hearing Info: Impaired Speech Info: Adequate    SPECIAL CARE FACTORS FREQUENCY                       Contractures Contractures Info: Not present    Additional Factors Info  Code Status, Allergies Code Status Info: DNR Allergies Info: Codeine, Robitussin (Alcohol Free) Guaifenesin           Current Medications (12/04/2016):  This is the current hospital active medication list Current Facility-Administered Medications  Medication Dose Route Frequency Provider Last Rate Last Dose  .  acetaminophen (TYLENOL) tablet 650 mg  650 mg Oral Q6H PRN Rise Patience, MD       Or  . acetaminophen (TYLENOL) suppository 650 mg  650 mg Rectal Q6H PRN Rise Patience, MD      . benzonatate (TESSALON) capsule 100 mg  100 mg Oral TID PRN Arnell Asal, NP      . butamben-tetracaine-benzocaine (CETACAINE) spray 1 spray  1 spray Topical Once Simonne Maffucci B, MD      . ceFEPIme (MAXIPIME) 2 g in dextrose 5 % 50 mL IVPB  2 g Intravenous Q24H Leroy Libman, [facility]   Stopped at 12/03/16 2130  .  fluticasone (FLONASE) 50 MCG/ACT nasal spray 1 spray  1 spray Each Nare Daily Rise Patience, MD   1 spray at 12/03/16 1000  . levalbuterol (XOPENEX) nebulizer solution 0.63 mg  0.63 mg Nebulization QID Erick Colace, NP   0.63 mg at 12/04/16 0805  . lidocaine (XYLOCAINE) 2 % jelly 1 application  1 application Topical Once Simonne Maffucci B, MD      . ondansetron (ZOFRAN) tablet 4 mg  4 mg Oral Q6H PRN Rise Patience, MD       Or  . ondansetron [facility]) injection 4 mg  4 mg Intravenous Q6H PRN Rise Patience, MD      . pantoprazole (PROTONIX) injection 40 mg  40 mg Intravenous Q24H Rise Patience, MD   40 mg at 12/04/16 0757  . phenylephrine (NEO-SYNEPHRINE) 0.25 % nasal spray 1 spray  1 spray Each Nare Q6H PRN Simonne Maffucci B, MD      . tiotropium (SPIRIVA) inhalation capsule 18 mcg  18 mcg Inhalation Daily Rise Patience, MD   18 mcg at 12/04/16 0805  . vancomycin (VANCOCIN) IVPB 1000 mg/200 mL premix  1,000 mg Intravenous Q24H Lyndee Leo, [facility]   Stopped at 12/03/16 1544     Discharge Medications: Please see discharge summary for a list of discharge medications.  Relevant Imaging Results:  Relevant Lab Results:   Additional Information SSN: [id number]  Estanislado Emms, LCSW

## 2016-12-04 NOTE — Progress Notes (Signed)
Patient did not won't to do flutter at this time due to having company.

## 2016-12-04 NOTE — Progress Notes (Signed)
Patient decided to let RT perform flutter valve with her.

## 2016-12-04 NOTE — Progress Notes (Signed)
Patient is continuing to refuse CPT vest. RT is going to try flutter.

## 2016-12-04 NOTE — Clinical Social Work Note (Signed)
Clinical Social Work Assessment  Patient Details  Name: Paula Contreras MRN: [id number] Date of Birth: September 09, 1933  Date of referral:  12/04/16               Reason for consult:  Facility Placement, Discharge Planning (from [facility])                Permission sought to share information with:  Facility Sport and exercise psychologist, Family Supports Permission granted to share information::     Name::     Paula Contreras  Agency::  [facility]  Relationship::     Contact Information:  [phone]  Housing/Transportation Living arrangements for the past 2 months:  [place] of Information:  Patient, Facility Patient Interpreter Needed:  None Criminal Activity/Legal Involvement Pertinent to Current Situation/Hospitalization:  No - Comment as needed Significant Relationships:  Other Family Members Lives with:  Facility Resident Do you feel safe going back to the place where you live?  Yes Need for family participation in patient care:  No (Coment)  Care giving concerns: Patient from [facility]. Not assessed by PT at this time.   Social Worker assessment / plan:  CSW attempted to meet with patient and patient upset about being wet; asked for nurse. CSW attempted again in afternoon and patient sleeping after procedure. CSW spoke to [place], nurse at patient's ALF. ALF will take patient back when ready. CSW left message for patient's sister-in-law and POA, Paula Contreras, awaiting call back. CSW to continue to follow and support with discharge.  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  Patient/Family's Understanding of and Emotional Response to Diagnosis, Current Treatment, and Prognosis: Unable to assess patient and family  understanding of medical condition and treatment; will check in with patient again.  Emotional Assessment Appearance:  Appears stated age Attitude/Demeanor/Rapport:  Unable to Assess Affect (typically observed):  Unable to Assess Orientation:  Oriented to Self, Oriented to Place, Oriented to  Time, Oriented to Situation Alcohol / Substance use:  Not Applicable Psych involvement (Current and /or in the community):  No (Comment)  Discharge Needs  Concerns to be addressed:  Discharge Planning Concerns Readmission within the last 30 days:  No Current discharge risk:  Physical Impairment Barriers to Discharge:  Continued Medical Work up   Estanislado Emms, LCSW 12/04/2016, 4:49 PM

## 2016-12-05 ENCOUNTER — Encounter (HOSPITAL_COMMUNITY): Payer: Self-pay | Admitting: Emergency Medicine

## 2016-12-05 DIAGNOSIS — J449 Chronic obstructive pulmonary disease, unspecified: Secondary | ICD-10-CM

## 2016-12-05 DIAGNOSIS — I480 Paroxysmal atrial fibrillation: Secondary | ICD-10-CM

## 2016-12-05 LAB — GLUCOSE, CAPILLARY
GLUCOSE-CAPILLARY: 132 mg/dL — AB (ref 65–99)
Glucose-Capillary: 98 mg/dL (ref 65–99)
Glucose-Capillary: 113 mg/dL — ABNORMAL HIGH (ref 65–99)

## 2016-12-05 MED ORDER — MUPIROCIN 2 % EX OINT
1.0000 "application " | TOPICAL_OINTMENT | Freq: Two times a day (BID) | CUTANEOUS | Status: DC
  Administered 2016-12-05 – 2016-12-07 (×4): 1 via NASAL
  Filled 2016-12-05: qty 22

## 2016-12-05 MED ORDER — CHLORHEXIDINE GLUCONATE CLOTH 2 % EX PADS
6.0000 | MEDICATED_PAD | Freq: Every day | CUTANEOUS | Status: DC
  Administered 2016-12-06 – 2016-12-07 (×2): 6 via TOPICAL

## 2016-12-05 MED ORDER — LEVALBUTEROL HCL 0.63 MG/3ML IN NEBU
0.6300 mg | INHALATION_SOLUTION | Freq: Three times a day (TID) | RESPIRATORY_TRACT | Status: DC
  Administered 2016-12-05 – 2016-12-07 (×6): 0.63 mg via RESPIRATORY_TRACT
  Filled 2016-12-05 (×8): qty 3

## 2016-12-05 NOTE — Progress Notes (Signed)
[facility] PROGRESS NOTE  Paula Contreras NLZ:[id number] DOB: May 03, 1933 DOA: 11/29/2016 PCP: Josetta Huddle, MD  Interim summary and HPI 83 y.o.femalewith a history of COPD and paroxysmal atrial fibrillation who presented to the ER with complaints of hemoptysis.  Assessment/Plan: Acute on chronic resp failure with hypoxia/hemoptysis    -patient uses 2L [place] at home -presented with worsening breathing and increase in oxygen supplementation -patient also had some hemoptysis -s/p bronchoscopy and found with large lung Mass obstructing left mainstem -waiting biopsy results; will follow up rec's from PCCM  COPD with chronic bronchitis -stable currently -will continue inhaler/nebs therapy -no wheezing  Lung mass -with concerns for bronchogenic carcinoma -s/p bronchoscopy and biopsy -will ask evaluation from radiation oncology -continue supportive management   Chronic PAF -CHADsVASC score 2-3 -holding xarelto due to hemoptysis -rate controlled -will monitor on telemetry   Positive MRSA screening -continue contact precaution -chlorhexidine and mupirocin given     Code Status: DNR Family Communication: no family at bedside  Disposition Plan: SNF at discharge; waiting on results from biopsy and following Dr. Lamonte Sakai rec's will also involved radiation oncology    Consultants:  PCCM  Procedures:  Bronchoscopy 9/4  Antibiotics: Cefepime 8/30 > 9/3 Vancomycin 8/30 > 9/4  HPI/Subjective: Afebrile, no CP, no nausea, no vomiting, no abd pain. Patient reported improvement in her breathing and is having good O2 sat on chronic 2L supplementation  Objective: Vitals:   12/05/16 1359 12/05/16 1702  BP:  (!) 106/56  Pulse:    Resp:  16  Temp:    SpO2: 95% 95%    Intake/Output Summary (Last 24 hours) at 12/05/16 1746 Last data filed at 12/05/16 1737  Gross per 24 hour  Intake              240 ml  Output             1400 ml  Net            -1160 ml   Filed Weights    12/03/16 0400 12/04/16 0612 12/05/16 0520  Weight: 64.3 kg (141 lb 11.2 oz) 65.6 kg (144 lb 9.6 oz) 65.2 kg (143 lb 11.2 oz)    Exam:   General: afebrile, no CP, no nausea, no vomiting. Patient reports breathing is improved and denies further hemoptysis. Very hard of hearing   Cardiovascular: no rubs, no gallops, rate controlled, no murmurs  Respiratory: no wheezing, no crackles; decrease breath sound in her left lung fields  Abdomen: soft, NT, ND, positive BS  Musculoskeletal: no edema, no cyanosis   Data Reviewed: Basic Metabolic Panel:  Recent Labs Lab 11/29/16 1636 11/30/16 0323 12/01/16 0557 12/03/16 0913  NA 138 138 138 139  K 4.3 4.1 4.2 4.5  CL 98* 99* 100* 102  CO2 34* 31 32 29  GLUCOSE 97 90 92 90  BUN 17 19 15 10   CREATININE 1.15* 1.05* 1.10* 1.04*  CALCIUM 10.7* 9.9 9.1 8.9  MG  --   --   --  1.9   Liver Function Tests:  Recent Labs Lab 11/29/16 1636  AST 17  ALT 10*  ALKPHOS 60  BILITOT 0.3  PROT 7.1  ALBUMIN 3.0*   CBC:  Recent Labs Lab 11/29/16 1636 11/30/16 0323 12/01/16 0557 12/02/16 0239 12/03/16 0913  WBC 8.5 8.8 7.7 7.2 7.4  NEUTROABS 6.9  --   --   --  5.4  HGB 8.4* 7.8* 7.9* 7.6* 7.8*  HCT 28.4* 27.1* 27.2* 25.7* 27.4*  MCV 81.8 81.6  81.4 80.8 83.0  PLT 380 357 332 339 344   BNP (last 3 results)  Recent Labs  02/16/16 1850 03/07/16 2349  BNP 109.8* 200.3*   CBG:  Recent Labs Lab 12/03/16 2307 12/04/16 0612 12/04/16 1150 12/04/16 2107 12/05/16 1148  GLUCAP 97 91 98 130* 98    Recent Results (from the past 240 hour(s))  MRSA PCR Screening     Status: Abnormal   Collection Time: 11/30/16  4:10 AM  Result Value Ref Range Status   MRSA by PCR POSITIVE (A) NEGATIVE Final    Comment:        The GeneXpert MRSA Assay (FDA approved for NASAL specimens only), is one component of a comprehensive MRSA colonization surveillance program. It is not intended to diagnose MRSA infection nor to guide or monitor  treatment for MRSA infections. RESULT CALLED TO, READ BACK BY AND VERIFIED WITH: C.WOODARD, RN 11/30/16 0636 L.CHAMPION      Studies: No results found.  Scheduled Meds: . fluticasone  1 spray Each Nare Daily  . levalbuterol  0.63 mg Nebulization TID  . pantoprazole  40 mg Oral Q1200  . tiotropium  18 mcg Inhalation Daily   Continuous Infusions:  Active Problems:   COPD (chronic obstructive pulmonary disease) (HCC)   Aortic stenosis   PAF (paroxysmal atrial fibrillation) (HCC)   CKD (chronic kidney disease), stage III   Acute on chronic respiratory failure with hypoxia (HCC)   Diastolic dysfunction   Acute respiratory failure (HCC)   Atelectasis   Hemoptysis   Mass of left lung    Time spent: 25 minutes    [name]  [facility] Pager [phone]. If 7PM-7AM, please contact night-coverage at [email or web address], password [facility] 12/05/2016, 5:46 PM  LOS: 6 days

## 2016-12-05 NOTE — Progress Notes (Signed)
CSW met with patient at bedside and discussed PT recommendations for SNF. Patient undecided about whether she will agree to SNF. Awaiting results of patient's bronchoscopy to determine treatment plan. CSW to follow and support with disposition planning.  Estanislado Emms, [place]

## 2016-12-05 NOTE — NC FL2 (Signed)
[place] LEVEL OF CARE SCREENING TOOL     IDENTIFICATION  Patient Name: Paula Contreras Birthdate: 01/16/1934 Sex: female Admission Date (Current Location): 11/29/2016  [facility] and [place] Number:  Herbalist and Address:  [facility] [place]. [facility], [place] [address] [place], [place] [address]      Provider Number: [id number]  Attending Physician Name and Address:  [name], MD  Relative Name and Phone Number:  [name] [phone]    Current Level of Care: Hospital Recommended Level of Care: [place] Prior Approval Number:    Date Approved/Denied:   PASRR Number: [id number]  Discharge Plan: SNF    Current Diagnoses: Patient Active Problem List   Diagnosis Date Noted  . Hemoptysis   . Mass of left lung   . Atelectasis   . Acute respiratory failure ([place]) 11/29/2016  . Pressure injury of skin 03/10/2016  . Sepsis ([place]) 03/08/2016  . CAP (community acquired pneumonia) 03/08/2016  . Sepsis due to pneumonia ([place])   . COPD exacerbation ([place])   . Acute on chronic respiratory failure with hypoxia ([place])   . Paroxysmal atrial fibrillation (HCC)   . Diastolic dysfunction   . Abnormal LFTs   . Bile duct stone 02/25/2016  . Chronic anticoagulation - Xerelto 02/25/2016  . Ascending cholangitis due to choledocolithiasis 02/25/2016  . HCAP (healthcare-associated pneumonia) 02/24/2016  . LFTs abnormal 02/24/2016  . Epigastric pain 02/24/2016  . Nausea and vomiting 02/24/2016  . History of bradycardia 10/27/2015  . SOB (shortness of breath)   . Encounter for palliative care   . Hypernatremia 02/21/2015  . Altered mental status 02/20/2015  . Metabolic encephalopathy 54/27/0623  . Weakness generalized 02/20/2015  . Dehydration 02/20/2015  . Leukocytosis 02/20/2015  . Anemia 02/20/2015  . Bronchitis 02/20/2015  . Fall at nursing home 02/20/2015  . Bradycardia 04/09/2014  . Chronic respiratory failure ([place])   .  Aortic stenosis   . PAF (paroxysmal atrial fibrillation) ([place])   . CKD (chronic kidney disease), stage III   . Aortic valve disorder 10/30/2013  . Undiagnosed cardiac murmurs 07/21/2013  . Hypertension 10/02/2010  . COPD (chronic obstructive pulmonary disease) ([place]) 10/02/2010    Orientation RESPIRATION BLADDER Height & Weight     Self, Time, Situation, Place  O2 (nasal cannula 2L) Incontinent, External catheter Weight: 143 lb 11.2 oz (65.2 kg) Height:  5\' 6"  (167.6 cm)  BEHAVIORAL SYMPTOMS/MOOD NEUROLOGICAL BOWEL NUTRITION STATUS      Continent Diet (please see DC summary)  AMBULATORY STATUS COMMUNICATION OF NEEDS Skin   Extensive Assist Verbally Normal                       Personal Care Assistance Level of Assistance  Bathing, Feeding, Dressing Bathing Assistance: Limited assistance Feeding assistance: Independent Dressing Assistance: Limited assistance     Functional Limitations Info  Sight, Hearing, Speech Sight Info: Adequate Hearing Info: Impaired Speech Info: Adequate    SPECIAL CARE FACTORS FREQUENCY  PT (By licensed PT), OT (By licensed OT)     PT Frequency: 5x/week OT Frequency: 5x/week            Contractures Contractures Info: Not present    Additional Factors Info  Code Status, Allergies Code Status Info: DNR Allergies Info: Codeine, Robitussin (Alcohol Free) Guaifenesin           Current Medications (12/05/2016):  This is the current hospital active medication list Current Facility-Administered Medications  Medication Dose Route  Frequency Provider Last Rate Last Dose  . acetaminophen (TYLENOL) tablet 650 mg  650 mg Oral Q6H PRN Rise Patience, MD       Or  . acetaminophen (TYLENOL) suppository 650 mg  650 mg Rectal Q6H PRN Rise Patience, MD      . benzonatate (TESSALON) capsule 100 mg  100 mg Oral TID PRN Jennelle Human B, NP      . fluticasone (FLONASE) 50 MCG/ACT nasal spray 1 spray  1 spray Each Nare Daily Rise Patience, MD   1 spray at 12/05/16 0859  . levalbuterol (XOPENEX) nebulizer solution 0.63 mg  0.63 mg Nebulization TID Cherene Altes, MD   0.63 mg at 12/05/16 0940  . ondansetron (ZOFRAN) tablet 4 mg  4 mg Oral Q6H PRN Rise Patience, MD       Or  . ondansetron [facility]) injection 4 mg  4 mg Intravenous Q6H PRN Rise Patience, MD      . pantoprazole (PROTONIX) EC tablet 40 mg  40 mg Oral Q1200 Cherene Altes, MD   40 mg at 12/05/16 0859  . tiotropium (SPIRIVA) inhalation capsule 18 mcg  18 mcg Inhalation Daily Rise Patience, MD   18 mcg at 12/04/16 0805     Discharge Medications: Please see discharge summary for a list of discharge medications.  Relevant Imaging Results:  Relevant Lab Results:   Additional Information SSN: [id number]  Estanislado Emms, LCSW

## 2016-12-05 NOTE — Plan of Care (Signed)
Problem: Safety: Goal: Ability to remain free from injury will improve Outcome: Progressing Verbalizes understanding of need to utilize call light for assistance prior to ambulation

## 2016-12-05 NOTE — Evaluation (Signed)
Physical Therapy Evaluation Patient Details Name: Paula Contreras MRN: [id number] DOB: 1933-10-10 Today's Date: 12/05/2016   History of Present Illness  Patient is a 83 y/o female who presents with hemoptysis and Acute hypoxic respiratory failure. Bronchoscopy revealed Completely obstructed left mainstem bronchus. PMH includes dementia, A-fib, chronic diastolic CHF, HTN, COPD on 02 at baseline, CKD stage III.  Clinical Impression  Patient presents with generalized weakness, baseline dementia, and impaired mobility s/p above. Pt from [facility] and requires assist for most ADLs and is minimally ambulatory at baseline. Pt requires Min-Mod A for standing and SPT to chair due to LOB posteriorly. Fearful of falling. Pt decline any further mobility today. Will follow acutely to maximize independence and mobility prior to return to [facility].     Follow Up Recommendations SNF;Supervision for mobility/OOB;Supervision/Assistance - 24 hour ([facility])    Equipment Recommendations  None recommended by PT    Recommendations for Other Services       Precautions / Restrictions Precautions Precautions: Fall Restrictions Weight Bearing Restrictions: No      Mobility  Bed Mobility Overal bed mobility: Needs Assistance Bed Mobility: Rolling;Sidelying to Sit Rolling: Min guard Sidelying to sit: Min guard;HOB elevated       General bed mobility comments: Increased time and heavy use of rail.   Transfers Overall transfer level: Needs assistance Equipment used: Rolling walker (2 wheeled) Transfers: Sit to/from [facility] Sit to Stand: Mod assist Stand pivot transfers: Min assist       General transfer comment: Assist to power to standing with cues for hand placement/technique. Stood from [employer], transferred to chair with Min A, posterior lean and LOB requiring assist to lower to chair.   Ambulation/Gait             General Gait Details: Pt declined.   Stairs             Wheelchair Mobility    Modified Rankin (Stroke Patients Only)       Balance Overall balance assessment: Needs assistance Sitting-balance support: Feet supported;No upper extremity supported Sitting balance-Leahy Scale: Fair     Standing balance support: During functional activity;Bilateral upper extremity supported Standing balance-Leahy Scale: Poor Standing balance comment: Reliant on UEs for support and Min A for static balance and Mod A for dynamic standing due to posterior lean.                             Pertinent Vitals/Pain Pain Assessment: Faces Faces Pain Scale: No hurt    Home Living Family/patient expects to be discharged to:: Other (Comment) ([facility])                 Additional Comments: From [facility], plan to return.    Prior Function Level of Independence: Needs assistance   Gait / Transfers Assistance Needed: pt states she is ambulatory with RW, typically only around room, meals brought to room  ADL's / Homemaking Assistance Needed: Pt has assist with all meals(delivered to room) and housekeeping.  Pt also gets assist with bathing/dressing.  Pt can toilet with mod I and grooms/eats without assist.        Hand Dominance   Dominant Hand: Right    Extremity/Trunk Assessment   Upper Extremity Assessment Upper Extremity Assessment: Defer to OT evaluation    Lower Extremity Assessment Lower Extremity Assessment: Generalized weakness    Cervical / Trunk Assessment Cervical / Trunk Assessment: Kyphotic  Communication  Communication: HOH  Cognition Arousal/Alertness: Awake/alert Behavior During Therapy: WFL for tasks assessed/performed Overall Cognitive Status: History of cognitive impairments - at baseline                                        General Comments General comments (skin integrity, edema, etc.): VSS. Wears 02 PTA- 2L    Exercises     Assessment/Plan    PT Assessment  Patient needs continued PT services  PT Problem List Decreased strength;Decreased mobility;Decreased safety awareness;Decreased balance;Decreased activity tolerance;Decreased cognition;Cardiopulmonary status limiting activity       PT Treatment Interventions Therapeutic activities;Gait training;Therapeutic exercise;Patient/family education;Balance training;Functional mobility training;DME instruction    PT Goals (Current goals can be found in the Care Plan section)  Acute Rehab PT Goals Patient Stated Goal: to stay in this bed PT Goal Formulation: With patient Time For Goal Achievement: 12/19/16 Potential to Achieve Goals: Fair    Frequency Min 2X/week   Barriers to discharge        Co-evaluation               AM-PAC PT "6 Clicks" Daily Activity  Outcome Measure Difficulty turning over in bed (including adjusting bedclothes, sheets and blankets)?: None Difficulty moving from lying on back to sitting on the side of the bed? : None Difficulty sitting down on and standing up from a chair with arms (e.g., wheelchair, bedside commode, etc,.)?: Unable Help needed moving to and from a bed to chair (including a wheelchair)?: A Lot Help needed walking in hospital room?: A Lot Help needed climbing 3-5 steps with a railing? : Total 6 Click Score: 14    End of Session Equipment Utilized During Treatment: Gait belt;Oxygen Activity Tolerance: Patient tolerated treatment well;Patient limited by fatigue Patient left: in chair;with call bell/phone within reach;Other (comment) (RT present in room) Nurse Communication: Mobility status PT Visit Diagnosis: Unsteadiness on feet (R26.81);Muscle weakness (generalized) (M62.81)    Time: 5329-9242 PT Time Calculation (min) (ACUTE ONLY): 19 min   Charges:   PT Evaluation $PT Eval Moderate Complexity: 1 Mod     PT G CodesWray Kearns, PT, DPT [phone]    Paula Contreras 12/05/2016, 10:08 AM

## 2016-12-06 ENCOUNTER — Ambulatory Visit
Admit: 2016-12-06 | Discharge: 2016-12-06 | Disposition: A | Discharge status: Home / Self Care | Payer: Medicare Other | Attending: Radiation Oncology | Admitting: Radiation Oncology

## 2016-12-06 ENCOUNTER — Other Ambulatory Visit: Payer: Self-pay | Admitting: Emergency Medicine

## 2016-12-06 ENCOUNTER — Ambulatory Visit
Admit: 2016-12-06 | Discharge: 2016-12-06 | Disposition: A | Discharge status: Home / Self Care | Payer: Medicare Other | Source: Ambulatory Visit | Attending: Radiation Oncology | Admitting: Radiation Oncology

## 2016-12-06 DIAGNOSIS — C3412 Malignant neoplasm of upper lobe, left bronchus or lung: Secondary | ICD-10-CM

## 2016-12-06 DIAGNOSIS — C3402 Malignant neoplasm of left main bronchus: Secondary | ICD-10-CM

## 2016-12-06 DIAGNOSIS — Z888 Allergy status to other drugs, medicaments and biological substances status: Secondary | ICD-10-CM | POA: Insufficient documentation

## 2016-12-06 DIAGNOSIS — Z9981 Dependence on supplemental oxygen: Secondary | ICD-10-CM | POA: Insufficient documentation

## 2016-12-06 DIAGNOSIS — Z510 Encounter for antineoplastic radiation therapy: Secondary | ICD-10-CM | POA: Insufficient documentation

## 2016-12-06 DIAGNOSIS — Z79899 Other long term (current) drug therapy: Secondary | ICD-10-CM | POA: Insufficient documentation

## 2016-12-06 DIAGNOSIS — Z885 Allergy status to narcotic agent status: Secondary | ICD-10-CM | POA: Insufficient documentation

## 2016-12-06 DIAGNOSIS — Z7951 Long term (current) use of inhaled steroids: Secondary | ICD-10-CM | POA: Insufficient documentation

## 2016-12-06 DIAGNOSIS — C3482 Malignant neoplasm of overlapping sites of left bronchus and lung: Secondary | ICD-10-CM

## 2016-12-06 DIAGNOSIS — Z7983 Long term (current) use of bisphosphonates: Secondary | ICD-10-CM | POA: Insufficient documentation

## 2016-12-06 DIAGNOSIS — J984 Other disorders of lung: Secondary | ICD-10-CM | POA: Insufficient documentation

## 2016-12-06 LAB — GLUCOSE, CAPILLARY
GLUCOSE-CAPILLARY: 116 mg/dL — AB (ref 65–99)
GLUCOSE-CAPILLARY: 102 mg/dL — AB (ref 65–99)
Glucose-Capillary: 95 mg/dL (ref 65–99)

## 2016-12-06 NOTE — Plan of Care (Signed)
Problem: Education: Goal: Knowledge of [place] [facility] Education information/materials will improve Outcome: Progressing Requires reinforcement   Problem: Safety: Goal: Ability to remain free from injury will improve Outcome: Progressing Educated on use of call light to call for assistance prior to ambulation. Patient verbalizes understanding  Problem: Pain Managment: Goal: General experience of comfort will improve Outcome: Progressing Denies pain

## 2016-12-06 NOTE — Progress Notes (Signed)
CSW met with patient at bedside and patient did not recall working with PT. Patient alert and oriented, though with some confusion and forgetfulness. Patient stated she would not go to SNF unless she knew what the physical therapy would be like. CSW described PT and reinforced PT recommendations. Patient continued to decline "unless I know what it is." CSW spoke to patient's sister-in-law and POA, Paula Contreras, via phone. Paula Contreras indicated patient has been in SNF before and consistently refuses to work with PT there. Paula Contreras indicated sending patient to SNF again would not be productive, as patient will likely just insist on going back to ALF. Paula Contreras indicated patient will work with PT at [place]. CSW will consult with ALF for home health arrangements and refer to [facility] if outside home health agency needed.  Estanislado Emms, [place]

## 2016-12-06 NOTE — Progress Notes (Signed)
Occupational Therapy Treatment and Discharge Patient Details Name: Paula Contreras MRN: [id number] DOB: Jul 30, 1933 Today's Date: 12/06/2016    History of present illness Patient is a 83 y/o female who presents with hemoptysis and Acute hypoxic respiratory failure. Bronchoscopy revealed Completely obstructed left mainstem bronchus. PMH includes dementia, A-fib, chronic diastolic CHF, HTN, COPD on 02 at baseline, CKD stage III.   OT comments  PTA Pt max A for sponge bath/dressing at ALF using RW for ambulation but stays in room. Self-feeding, wears adult diaper but performs own peri care with supervision. Pt is currently at baseline from ADL perspective. OT asked Pt about doing more activities at her ALF, and Pt responded "I like my room, I don't like getting out" but states that "sometimes I will go to crafts if I like them" No further OT needs in acute setting, Pt will have appropriate assistance at ALF for ADL. OT to sign off at this time. Thank you for the opportunity to serve this patient.   Follow Up Recommendations  Supervision/Assistance - 24 hour    Equipment Recommendations  None recommended by OT (defer to ALF)    Recommendations for Other Services      Precautions / Restrictions Precautions Precautions: Fall Restrictions Weight Bearing Restrictions: No       Mobility Bed Mobility Overal bed mobility: Needs Assistance Bed Mobility: Rolling;Sidelying to Sit Rolling: Min guard Sidelying to sit: Min guard;HOB elevated       General bed mobility comments: Increased time and heavy use of rail.   Transfers Overall transfer level: Needs assistance Equipment used: Rolling walker (2 wheeled) Transfers: Sit to/from [facility] Sit to Stand: Mod assist Stand pivot transfers: Min assist       General transfer comment: Assist to power to standing with cues for hand placement/technique. Stood from [employer], transferred to chair with Min A, posterior lean "you  better not let go of me" anxious with transfer    Balance Overall balance assessment: Needs assistance Sitting-balance support: Feet supported;No upper extremity supported Sitting balance-Leahy Scale: Good Sitting balance - Comments: able to sit EOB for approx 10 min without LOB Postural control: Posterior lean (in standing) Standing balance support: During functional activity;Bilateral upper extremity supported Standing balance-Leahy Scale: Poor Standing balance comment: Reliant on UEs for support and Min A for static balance and Mod A for dynamic standing due to posterior lean.                           ADL either performed or assessed with clinical judgement   ADL Overall ADL's : At baseline                                             Vision Baseline Vision/History: Wears glasses Wears Glasses: Reading only Patient Visual Report: No change from baseline Additional Comments: Able to read signs around room   Perception     Praxis      Cognition Arousal/Alertness: Awake/alert Behavior During Therapy: WFL for tasks assessed/performed Overall Cognitive Status: History of cognitive impairments - at baseline                                          Exercises     Shoulder  Instructions       General Comments      Pertinent Vitals/ Pain       Pain Assessment: No/denies pain Faces Pain Scale: No hurt  Home Living Family/patient expects to be discharged to:: Other (Comment) ([facility])                                 Additional Comments: From [facility], plan to return.      Prior Functioning/Environment Level of Independence: Needs assistance  Gait / Transfers Assistance Needed: pt states she is ambulatory with RW, typically only around room, meals brought to room ADL's / Homemaking Assistance Needed: Pt has assist with all meals(delivered to room) and housekeeping.  Pt also gets assist with  bathing/dressing.  Pt typically wears an adult diaper performs peri care at supervision level and grooms/eats without assist.       Frequency           Progress Toward Goals  OT Goals(current goals can now be found in the care plan section)     Acute Rehab OT Goals Patient Stated Goal: to get back to [facility] OT Goal Formulation: With patient Time For Goal Achievement: 12/20/16 Potential to Achieve Goals: Good  Plan      Co-evaluation                 AM-PAC PT "6 Clicks" Daily Activity     Outcome Measure   Help from another person eating meals?: None Help from another person taking care of personal grooming?: A Little Help from another person toileting, which includes using toliet, bedpan, or urinal?: A Lot Help from another person bathing (including washing, rinsing, drying)?: A Lot Help from another person to put on and taking off regular upper body clothing?: A Lot Help from another person to put on and taking off regular lower body clothing?: A Lot 6 Click Score: 15    End of Session Equipment Utilized During Treatment: Gait belt;Rolling walker;Oxygen  OT Visit Diagnosis: Unsteadiness on feet (R26.81)   Activity Tolerance Patient tolerated treatment well   Patient Left in chair;with call bell/phone within reach   Nurse Communication Mobility status;Other (comment) (external cath needs to be re applied, no chair alarm)        Time: 9407-6808 OT Time Calculation (min): 33 min  Charges: OT General Charges $OT Visit: 1 Visit OT Evaluation $OT Eval Moderate Complexity: 1 Mod OT Treatments $Self Care/Home Management : 8-22 mins  Hulda Humphrey OTR/L [place] 12/06/2016, 11:26 AM

## 2016-12-06 NOTE — Progress Notes (Addendum)
Worthy Flank PA requested that I call [facility] and [facility] to get Ms. Jewell Haught over to radiation oncology by 230pm today. I called Ms. Schlitt nurse Rica Mote and notified her that we needed her to come over here and that she needed too call [facility]. I notified  [facility] that I need Ms. Kusek at the [facility] by 230 pm and they stated that the nurse from [facility] needed to call them.Notified Rica Mote of what they said . [facility] called back and stated that they were  getting ready to dispatch the truck to bring her over.

## 2016-12-06 NOTE — Progress Notes (Signed)
[facility] PROGRESS NOTE  Arlin Savona ZOX:[id number] DOB: Jul 25, 1933 DOA: 11/29/2016 PCP: Josetta Huddle, MD  Interim summary and HPI 83 y.o.femalewith a history of COPD and paroxysmal atrial fibrillation who presented to the ER with complaints of hemoptysis.  Assessment/Plan: Acute on chronic resp failure with hypoxia/hemoptysis    -patient uses 2L [place] at home -presented with worsening breathing and increase in oxygen supplementation; now resolved and back to baseline. -no further hemoptysis appreciated -s/p bronchoscopy and found with large lung Mass obstructing left mainstem -biopsy results back and demonstrating invasive squamous lung cancer; radiation oncology and palliative care consulted.   COPD with chronic bronchitis -stable currently -will continue inhaler/nebs therapy -mild exp wheezing heard today  Invasive squamous cell Lung carcinoma -s/p bronchoscopy and biopsy demonstrating invasive squamous cell cancer -radiation oncology consulted for palliative radiation as recommended by pulmonologist  -patient is frail and not a great candidate for chemotherapy or more invasive treatment   -will ask palliative to see patient and also assist delineating care plan   Chronic PAF -CHADsVASC score 2-3 -holding xarelto indefinitely now due to hemoptysis and increase changes of bleeding from friable lung mass. -rate has remained controlled -will continue monitoring on telemetry   Positive MRSA screening -continue contact precaution -chlorhexidine and mupirocin given    Physical deconditioning  -PT/OT recommending SNF; but patient refusing    Code Status: DNR Family Communication: no family at bedside  Disposition Plan: might benefit of SNF at discharge; biopsy results demonstrating invasive squamous cell carcinoma. Radiation oncology and palliative care consulted.     Consultants:  PCCM  Radiation oncology  Palliative care  Procedures:  Bronchoscopy  9/4  Antibiotics: Cefepime 8/30 > 9/3 Vancomycin 8/30 > 9/4  HPI/Subjective: Afebrile, no CP, no nausea, no vomiting; no further episodes of hemoptysis. Good O2 sat on chronic [place] supplementation (2L)  Objective: Vitals:   12/06/16 1200 12/06/16 1500  BP:    Pulse:    Resp: 20 (!) 23  Temp: (!) 97.2 F (36.2 C)   SpO2: 98% 98%    Intake/Output Summary (Last 24 hours) at 12/06/16 1853 Last data filed at 12/06/16 1100  Gross per 24 hour  Intake               40 ml  Output              750 ml  Net             -710 ml   Filed Weights   12/04/16 0612 12/05/16 0520 12/06/16 0500  Weight: 65.6 kg (144 lb 9.6 oz) 65.2 kg (143 lb 11.2 oz) 63.6 kg (140 lb 4.8 oz)    Exam:   General: afebrile, no CP, no nausea, no vomiting. No acute distress and reports breathing is stable. No further hemoptysis reported. Patient is very hard of hearing.   Cardiovascular: no rubs, no gallops, no murmurs, rate controlled.  Respiratory: slight exp wheezing, no crackles, decrease d BS on the left lung field, normal resp effort, no using accessory muscles.   Abdomen: soft, NT, ND, positive bowel sounds  Musculoskeletal: no edema, no cyanosis    Data Reviewed: Basic Metabolic Panel:  Recent Labs Lab 11/30/16 0323 12/01/16 0557 12/03/16 0913  NA 138 138 139  K 4.1 4.2 4.5  CL 99* 100* 102  CO2 31 32 29  GLUCOSE 90 92 90  BUN 19 15 10   CREATININE 1.05* 1.10* 1.04*  CALCIUM 9.9 9.1 8.9  MG  --   --  1.9   CBC:  Recent Labs Lab 11/30/16 0323 12/01/16 0557 12/02/16 0239 12/03/16 0913  WBC 8.8 7.7 7.2 7.4  NEUTROABS  --   --   --  5.4  HGB 7.8* 7.9* 7.6* 7.8*  HCT 27.1* 27.2* 25.7* 27.4*  MCV 81.6 81.4 80.8 83.0  PLT 357 332 339 344   BNP (last 3 results)  Recent Labs  02/16/16 1850 03/07/16 2349  BNP 109.8* 200.3*   CBG:  Recent Labs Lab 12/05/16 1148 12/05/16 1809 12/05/16 2057 12/06/16 1131 12/06/16 1847  GLUCAP 98 132* 113* 95 116*    Recent Results (from  the past 240 hour(s))  MRSA PCR Screening     Status: Abnormal   Collection Time: 11/30/16  4:10 AM  Result Value Ref Range Status   MRSA by PCR POSITIVE (A) NEGATIVE Final    Comment:        The GeneXpert MRSA Assay (FDA approved for NASAL specimens only), is one component of a comprehensive MRSA colonization surveillance program. It is not intended to diagnose MRSA infection nor to guide or monitor treatment for MRSA infections. RESULT CALLED TO, READ BACK BY AND VERIFIED WITH: C.WOODARD, RN 11/30/16 0636 L.CHAMPION      Studies: No results found.  Scheduled Meds: . Chlorhexidine Gluconate Cloth  6 each Topical Q0600  . fluticasone  1 spray Each Nare Daily  . levalbuterol  0.63 mg Nebulization TID  . mupirocin ointment  1 application Nasal BID  . pantoprazole  40 mg Oral Q1200  . tiotropium  18 mcg Inhalation Daily   Continuous Infusions:  Active Problems:   COPD (chronic obstructive pulmonary disease) (HCC)   Aortic stenosis   PAF (paroxysmal atrial fibrillation) (HCC)   CKD (chronic kidney disease), stage III   Acute on chronic respiratory failure with hypoxia (HCC)   Diastolic dysfunction   Acute respiratory failure (HCC)   Atelectasis   Hemoptysis   Mass of left lung    Time spent: 25 minutes    [name]  [facility] Pager [phone]. If 7PM-7AM, please contact night-coverage at [email or web address], password [facility] 12/06/2016, 6:53 PM  LOS: 7 days

## 2016-12-06 NOTE — Progress Notes (Signed)
PCCM Interval Note  S: Feeling Ok post FOB. No cough or hemoptysis, breathing stable.  Her path is still pending  Vitals:   12/06/16 0500 12/06/16 0955 12/06/16 0958 12/06/16 1008  BP: 119/64     Pulse: 81 79    Resp: 13 14    Temp: 98 F (36.7 C)     TempSrc: Oral     SpO2: 92% 92% 92% 92%  Weight: 63.6 kg (140 lb 4.8 oz)     Height:       Gen: Pleasant, elderly woman on O2, in no distress,  normal affect  ENT: No oral lesions, significantly decreased hearing  Neck: No JVD, no stridor  Lungs: No use of accessory muscles, decreased on the L, distant on the R  Cardiovascular: RRR, heart sounds normal, no murmur or gallops, no peripheral edema  Musculoskeletal: No deformities, no cyanosis or clubbing  Neuro: alert, non focal  Skin: Warm, no lesions or rashes   Impression:  L mainstem obstruction by primary lung cancer, tissue type pending from recent endobronchial bx's and brushings Complete L lung atelectasis.  Hx COPD  Recs: Await tissue type of the endobronchial lesion. 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 Regardless of the tissue dx (or whether we even get a dx from these bx), I believe she needs to be referred to Radiation Oncology for Palliative XRT to the distal L mainstem. Although she may not be able to tolerate chemo / definitive therapy for malignancy, targeted XRT would potentially prevent hemoptysis, relieve airway obstruction. I discussed with her today and she is willing to discuss options with Rad Onc.   PLease call if we can assist further.    Baltazar Apo, MD, PhD 12/06/2016, 12:05 PM [facility] [phone] or if no answer [phone]

## 2016-12-07 ENCOUNTER — Ambulatory Visit: Payer: Medicare Other | Admitting: Radiation Oncology

## 2016-12-07 ENCOUNTER — Telehealth: Payer: Self-pay | Admitting: Radiation Oncology

## 2016-12-07 ENCOUNTER — Telehealth: Payer: Self-pay | Admitting: Oncology

## 2016-12-07 DIAGNOSIS — C3492 Malignant neoplasm of unspecified part of left bronchus or lung: Secondary | ICD-10-CM

## 2016-12-07 DIAGNOSIS — I519 Heart disease, unspecified: Secondary | ICD-10-CM

## 2016-12-07 DIAGNOSIS — I350 Nonrheumatic aortic (valve) stenosis: Secondary | ICD-10-CM

## 2016-12-07 DIAGNOSIS — Z515 Encounter for palliative care: Secondary | ICD-10-CM

## 2016-12-07 LAB — GLUCOSE, CAPILLARY
GLUCOSE-CAPILLARY: 92 mg/dL (ref 65–99)
GLUCOSE-CAPILLARY: 89 mg/dL (ref 65–99)
Glucose-Capillary: 94 mg/dL (ref 65–99)

## 2016-12-07 MED ORDER — BENZONATATE 100 MG PO CAPS: 100.0000 mg | ORAL_CAPSULE | Freq: Three times a day (TID) | ORAL | 0 refills | Status: AC | PRN

## 2016-12-07 NOTE — Care Management Note (Addendum)
Case Management Note  Patient Details  Name: Paula Contreras MRN: [id number] Date of Birth: 1934-01-28  Subjective/Objective:  Pt presented as a transfer from 15M with COPD, PAF with hemoptysis and lung collapse 2/ 2 lung mass. Pt is s/p Bronch 12-04-16  found with large lung Mass obstructing left mainstem -biopsy results back and demonstrating invasive squamous lung cancer; radiation oncology and palliative care consulted. Pt has an appointment  Sept 10 th at [facility] outpatient.                Action/Plan: ALF to monitor for Palliative/ Hospice Services and they will set that up. CSW provided [facility] with d/c summary. No further needs from CM at this time.   Expected Discharge Date:  12/07/16               Expected Discharge Plan:  Assisted Living / Rest Home  In-House Referral:  Clinical Social Work  Discharge planning Services  CM Consult  Post Acute Care Choice:  N/A Choice offered to:  N/A  DME Arranged:  N/A DME Agency:  NA  HH Arranged:  N/A  HH Agency:N/A   Status of Service:  Completed, signed off  If discussed at [place] of Stay Meetings, dates discussed:    Additional Comments:  Bethena Roys, RN 12/07/2016, 3:25 PM

## 2016-12-07 NOTE — Telephone Encounter (Signed)
Patient scheduled for a 0930 port and treat appointment on Monday, September 10th. Patient being discharged from the hospital today to [facility] home. Phoned Judeen Hammans at [facility] and informed her of upcoming appointments. Faxed over a copy of patient's treatment schedule to [place] at the fax number she provided. Fax confirmation of delivery obtained.

## 2016-12-07 NOTE — Consult Note (Signed)
Radiation Oncology         ([phone]) [phone] ________________________________  Initial inpatient Consultation  Name: Paula Contreras MRN: [id number]  Date of Service: 12/06/16 DOB: 22-Aug-1933  DG:LOVFI, Herbie [place], MD  No ref. provider found   REFERRING PHYSICIAN: No ref. provider found  DIAGNOSIS: The primary encounter diagnosis was Hemoptysis. Diagnoses of Atelectasis and Acute respiratory failure ([place]) were also pertinent to this visit.    ICD-10-CM   1. Hemoptysis R04.2 DG CHEST PORT 1 VIEW    DG CHEST PORT 1 VIEW  2. Atelectasis J98.11 DG CHEST PORT 1 VIEW    DG CHEST PORT 1 VIEW  3. Acute respiratory failure (HCC) J96.00 DG CHEST PORT 1 VIEW    DG CHEST PORT 1 VIEW    HISTORY OF PRESENT ILLNESS: Paula Contreras is a 83 y.o. female seen at the request of Dr. Dyann Kief for a probable Stage III NSCLC of the left lung. The patient was in her usual state of health until about two weeks ago when she started having more witnessed shortness of breath by staff at the assisted facility she resides in, as well as an episode of hemoptysis. The patient has been O2 dependant for about 5 years at 2L . She presented to the ED and her O2 demands had to be escalated to about 3L. She has been treated for acute respiratory failure, seen by pulmonary following imaging suggesting collapse of both left lobes of the lung from obstruction of the mainstem bronchus. The patient has undergone bronchoscopy on 12/04/16 and initial pathology suggests squamous cell carcinoma of the lung. We are asked to evaluate her for palliative radiotherapy. She has not met with medical oncology as of yet, and a palliative care consult is also being considered.  PREVIOUS RADIATION THERAPY: No  PAST MEDICAL HISTORY:  Past Medical History:  Diagnosis Date  . Anemia 02/20/2015  . Aortic stenosis    a. 2D Echo 07/2013: EF 60-65%, no RWMA, grade 1 DD, normal LV filling pressure, moderate AS, mild TR, PASP 23mHg.  . Bradycardia    a.  possible bradycardia with HR 44 by physical exam notes at nephrologist office in 03/2014. F/u event monitor showed NSR PACs average HR 81, bradycardia <1% of readable data, no pauses of 3 seconds or longer..  . Cataract   . Chronic respiratory failure ([place]    a. On home O2 since 2012.  . CKD (chronic kidney disease), stage III   . COPD (chronic obstructive pulmonary disease) ([place]   . Dementia   . Hypertension   . Osteoporosis   . Other psoriasis and similar disorders    psoriasis vs eczema  . Paroxysmal atrial fibrillation ([place]    a. Dx 07/2013. No attempts per Epic to restore NSR but in NSR 2016.  .Marland KitchenPneumonia 03/2016  . Skin cancer       PAST SURGICAL HISTORY: Past Surgical History:  Procedure Laterality Date  . BREAST SURGERY Right 1964   tumor removed  . CATARACT EXTRACTION Bilateral   . ERCP N/A 02/29/2016   Procedure: ENDOSCOPIC RETROGRADE CHOLANGIOPANCREATOGRAPHY (ERCP);  Surgeon: JIrene Shipper MD;  [facility];  Service: Endoscopy;  Laterality: N/A;  . HIP SURGERY Right    fracture  . VIDEO BRONCHOSCOPY Bilateral 12/04/2016   Procedure: VIDEO BRONCHOSCOPY WITHOUT FLUORO;  Surgeon: BCollene Gobble MD;  [facility];  Service: Cardiopulmonary;  Laterality: Bilateral;    FAMILY HISTORY:  Family History  Problem Relation Age of Onset  . Asthma Mother   .  Kidney disease Mother   . Lung cancer Father   . Asthma Maternal Grandmother   . Lung cancer Paternal Uncle        father's twin brother  . Diabetes Brother     SOCIAL HISTORY:  Social History   Social History  . Marital status: Widowed    Spouse name: N/A  . Number of children: 0  . Years of education: N/A   Occupational History  . data entry specialist     retired   Social History Main Topics  . Smoking status: Former Smoker    Packs/day: 1.00    Years: 55.00    Types: Cigarettes    Quit date: 09/25/2010  . Smokeless tobacco: Never Used  . Alcohol use No  . Drug use: No  .  Sexual activity: Not on file   Other Topics Concern  . Not on file   Social History Narrative  . No narrative on file  The patient lived in an assisted living facility prior to this hospitalization. She reports that she does not have any children. Her next of kin is her sister in law and a niece and nephew.  ALLERGIES: Codeine and Robitussin (alcohol free) [guaifenesin]  MEDICATIONS:  Current Facility-Administered Medications  Medication Dose Route Frequency Provider Last Rate Last Dose  . acetaminophen (TYLENOL) tablet 650 mg  650 mg Oral Q6H PRN Rise Patience, MD       Or  . acetaminophen (TYLENOL) suppository 650 mg  650 mg Rectal Q6H PRN Rise Patience, MD      . benzonatate (TESSALON) capsule 100 mg  100 mg Oral TID PRN Arnell Asal, NP      . Chlorhexidine Gluconate Cloth 2 % PADS 6 each  6 each Topical Q0600 [name], MD   6 each at 12/06/16 [phone]  . fluticasone (FLONASE) 50 MCG/ACT nasal spray 1 spray  1 spray Each Nare Daily Rise Patience, MD   1 spray at 12/06/16 0847  . levalbuterol (XOPENEX) nebulizer solution 0.63 mg  0.63 mg Nebulization TID Cherene Altes, MD   0.63 mg at 12/06/16 2130  . mupirocin ointment (BACTROBAN) 2 % 1 application  1 application Nasal BID [name], MD   1 application at 91/63/84 2123  . ondansetron (ZOFRAN) tablet 4 mg  4 mg Oral Q6H PRN Rise Patience, MD       Or  . ondansetron [facility]) injection 4 mg  4 mg Intravenous Q6H PRN Rise Patience, MD      . pantoprazole (PROTONIX) EC tablet 40 mg  40 mg Oral Q1200 Cherene Altes, MD   40 mg at 12/06/16 0847  . tiotropium (SPIRIVA) inhalation capsule 18 mcg  18 mcg Inhalation Daily Rise Patience, MD   18 mcg at 12/06/16 1001    REVIEW OF SYSTEMS:  On review of systems, the patient reports that she feels as though she's ready to go home, and is doing well overall. She denies any chest pain, shortness of breath, cough, fevers, chills, night  sweats. She does not recall any weight changes prior to coming to the hospital. She has only had two episodes of hemoptysis and reports that this was about 1 cup of blood that she coughed up the day she presented to the ER as well as one occurrence of this during her hospitalization last week. She  denies any bowel or bladder disturbances, and denies abdominal pain, nausea or vomiting. She denies any new musculoskeletal  or joint aches or pains. A complete review of systems is obtained and is otherwise negative.    PHYSICAL EXAM:  Wt Readings from Last 3 Encounters:  12/07/16 140 lb 3.2 oz (63.6 kg)  03/13/16 162 lb 0.6 oz (73.5 kg)  02/24/16 163 lb (73.9 kg)   Temp Readings from Last 3 Encounters:  12/07/16 98 F (36.7 C) (Oral)  03/13/16 98.4 F (36.9 C) (Oral)  03/02/16 98.4 F (36.9 C) (Oral)   BP Readings from Last 3 Encounters:  12/07/16 124/63  03/13/16 (!) 149/66  03/02/16 120/60   Pulse Readings from Last 3 Encounters:  12/07/16 69  03/13/16 63  03/02/16 67   Pain Assessment Pain Score: 1 /10  In general this is a chronic appearing caucasian female in no acute distress. She is alert and oriented x4 and appropriate throughout the examination. HEENT reveals that the patient is normocephalic, atraumatic. EOMs are intact. PERRLA. Skin is intact without any evidence of gross lesions. Cardiovascular exam reveals a regular rate and rhythm, no clicks rubs or murmurs are auscultated. Chest is clear to auscultation bilaterally. Lymphatic assessment is performed and does not reveal any adenopathy in the cervical, supraclavicular, axillary, or inguinal chains. Abdomen has active bowel sounds in all quadrants and is intact. The abdomen is soft, non tender, non distended. Lower extremities are negative for pretibial pitting edema, deep calf tenderness, cyanosis or clubbing.   KPS = 60  100 - Normal; no complaints; no evidence of disease. 90   - Able to carry on normal activity; minor  signs or symptoms of disease. 80   - Normal activity with effort; some signs or symptoms of disease. 30   - Cares for self; unable to carry on normal activity or to do active work. 60   - Requires occasional assistance, but is able to care for most of his personal needs. 50   - Requires considerable assistance and frequent medical care. 78   - Disabled; requires special care and assistance. 20   - Severely disabled; hospital admission is indicated although death not imminent. 25   - Very sick; hospital admission necessary; active supportive treatment necessary. 10   - Moribund; fatal processes progressing rapidly. 0     - Dead  Karnofsky DA, [place], Craver LS and [facility] [phone]) The use of the nitrogen mustards in the palliative treatment of carcinoma: with particular reference to bronchogenic carcinoma Cancer 1 [phone]  LABORATORY DATA:  Lab Results  Component Value Date   WBC 7.4 12/03/2016   HGB 7.8 (L) 12/03/2016   HCT 27.4 (L) 12/03/2016   MCV 83.0 12/03/2016   PLT 344 12/03/2016   Lab Results  Component Value Date   NA 139 12/03/2016   K 4.5 12/03/2016   CL 102 12/03/2016   CO2 29 12/03/2016   Lab Results  Component Value Date   ALT 10 (L) 11/29/2016   AST 17 11/29/2016   ALKPHOS 60 11/29/2016   BILITOT 0.3 11/29/2016     RADIOGRAPHY: Dg Chest 2 View  Result Date: 11/29/2016 CLINICAL DATA:  Hemoptysis.  History of pneumonia. EXAM: CHEST  2 VIEW COMPARISON:  03/10/2016 FINDINGS: The cardiomediastinal silhouette is obscured by a large left pleural effusion. There is severe leftward mediastinal shift. Calcific atherosclerotic disease of the aorta noted. The right lung is clear.  The left lung is not visualized. Compression deformity of T10 and L1 vertebral bodies, stable from prior CT dated November 2017. Soft tissues are grossly normal. IMPRESSION: Severe  leftward mediastinal shift with deviation of the carina to the midline of the left hemithorax. Large left  pleural effusion. Left lung is not visualized. Possible left lung collapse. Clear right lung. Further evaluation with chest CT with contrast is recommended to establish the reason for severe leftward mediastinal shift. These results were called by telephone at the time of interpretation on 11/29/2016 at 4:38 pm to Dr. Nanda Quinton , who verbally acknowledged these results. Electronically Signed   By: Fidela Salisbury M.D.   On: 11/29/2016 16:39   Ct Head Wo Contrast  Result Date: 12/03/2016 CLINICAL DATA:  83 year old female with possible obstructing left lung mass on recent chest CT. EXAM: CT HEAD WITHOUT CONTRAST TECHNIQUE: Contiguous axial images were obtained from the base of the skull through the vertex without intravenous contrast. COMPARISON:  Head CT without contrast 02/19/2015 FINDINGS: Brain: Cerebral volume remains normal for age. No midline shift, mass effect, or evidence of intracranial mass lesion. No ventriculomegaly. No acute intracranial hemorrhage identified. Patchy and subcortical bilateral cerebral white matter hypodensity is stable since 2016. Involvement of the deep white matter capsules and mild for age bilateral basal ganglia heterogeneity. No cortically based acute infarct identified. No cortical encephalomalacia identified. Vascular: Calcified atherosclerosis at the skull base. Skull: Skull bone mineralization is stable and within normal limits. No acute or suspicious osseous lesion identified. Sinuses/Orbits: Regressed ethmoid mucosal thickening since 2016. Visualized paranasal sinuses and mastoids are well pneumatized. Other: Visible orbit and scalp soft tissues are stable and negative. IMPRESSION: 1.  No acute intracranial abnormality. Note that early metastatic disease to the brain cannot be excluded in the absence of intravenous contrast. 2. Chronic probable small vessel disease related cerebral white matter changes are stable since 2016. Electronically Signed   By: Genevie Ann M.D.    On: 12/03/2016 17:42   Ct Chest W Contrast  Addendum Date: 11/30/2016   ADDENDUM REPORT: 11/30/2016 08:23 ADDENDUM: A voice recognition error is present within the impression of the initial report. The impression should state: IMRESSION: Subtotal atelectasis of LEFT lung with extensive fluid/mucus throughout LEFT mainstem bronchus, LEFT lower lobe bronchus and extending into subsegmental bronchi in both the upper and lower lobes. A discrete obstructing mass is not visualized but is not completely excluded; consider followup BRONCHOSCOPY. Associated small LEFT pleural effusion. Underlying COPD changes RIGHT lung. Extensive atherosclerotic disease including coronary artery calcifications. Small hiatal hernia. Aortic Atherosclerosis (ICD10-I70.0) and Emphysema (ICD10-J43.9). Electronically Signed   By: Lavonia Dana M.D.   On: 11/30/2016 08:23   Result Date: 11/30/2016 CLINICAL DATA:  Abnormal chest radiograph with LEFT lung collapse and mediastinal shift to the LEFT EXAM: CT CHEST WITH CONTRAST TECHNIQUE: Multidetector CT imaging of the chest was performed during intravenous contrast administration. Sagittal and coronal MPR images reconstructed from axial data set. CONTRAST:  70m ISOVUE-300 IOPAMIDOL (ISOVUE-300) INJECTION 61% IV. COMPARISON:  Chest radiograph 11/29/2016 FINDINGS: Cardiovascular: Atherosclerotic calcifications aorta, proximal great vessels, and coronary arteries. Thoracic aorta normal caliber. Thoracic vascular structures grossly patent on nondedicated exam. Minimal pericardial effusion. Heart size normal. Mediastinum/Nodes: Mediastinal shift to the LEFT. Moderate-sized hiatal hernia. Question wall thickening of distal esophagus and gastroesophageal junction, versus artifact from incomplete distention. Scattered normal size mediastinal lymph nodes. No thoracic adenopathy. Base of cervical region unremarkable. Lungs/Pleura: Small LEFT pleural effusion. Subtotal atelectasis of LEFT lung with fluid  filling the LEFT mainstem and LEFT lower lobe bronchi as well as more peripheral portions of LEFT upper and LEFT lower lobe bronchi. Minimal residual aeration of LEFT  upper lobe. No discrete mass or obstructing lesion is delineated. Underlying emphysematous changes of the RIGHT lung with central peribronchial thickening but no definite infiltrate. No RIGHT pleural effusion or mass. Upper Abdomen: Pneumobilia consistent with history of prior ERCP. Remaining visualized upper abdomen unremarkable. Musculoskeletal: Diffuse osseous demineralization. Marked compression deformities of T10 and L1 vertebral bodies. Old healed BILATERAL rib fractures. IMPRESSION: Subtotal atelectasis of LEFT lung with extensive fluid/mucous throughout LEFT mainstem bronchus, LEFT lower lobe bronchus and extending into subsegmental bronchi in both the upper and lower lobes. A discrete obstructing mass is not visualized but is not completely excluded ; consider follow-up proctoscopy. Associated small LEFT pleural effusion. Underlying COPD changes RIGHT lung. Extensive atherosclerotic disease including coronary artery calcifications. Small hiatal hernia. Aortic Atherosclerosis (ICD10-I70.0) and Emphysema (ICD10-J43.9). Electronically Signed: By: Lavonia Dana M.D. On: 11/29/2016 18:51   Ct Abdomen Pelvis W Contrast  Result Date: 12/03/2016 CLINICAL DATA:  Large fungating mass and left mainstem bronchus completely occluded. Cancer workup, evaluate for metastatic disease. Lung cancer screen, high risk, >= 20 pack-year hx of smoking, and one additional risk factor other than second-hand smoke, initial workup. EXAM: CT ABDOMEN AND PELVIS WITH CONTRAST TECHNIQUE: Multidetector CT imaging of the abdomen and pelvis was performed using the standard protocol following bolus administration of intravenous contrast. CONTRAST:  123m ISOVUE-300 IOPAMIDOL (ISOVUE-300) INJECTION 61% COMPARISON:  Chest CT 11/29/2016.  Abdominal CT 02/25/2016 FINDINGS: Lower  chest: Left lung volume loss with atelectasis in left pleural effusion, assessed on recent chest CT. No right pleural effusion or basilar consolidation. Moderate hiatal hernia. Hepatobiliary: No focal hepatic lesion. Pneumobilia with air in the gallbladder, intra and extrahepatic biliary tree, likely sequela of prior ERCP. No pericholecystic inflammation. Pancreas: Mild parenchymal atrophy. No ductal dilatation or inflammation. No evidence of focal lesion. Spleen: Normal in size without focal abnormality. Adrenals/Urinary Tract: Mild left adrenal thickening without discrete nodule. The right adrenal gland is normal. No hydronephrosis or perinephric edema. Homogeneous renal enhancement with symmetric excretion on delayed phase imaging. No focal renal lesion. Urinary bladder is physiologically distended. No bladder wall thickening. Stomach/Bowel: In moderate hiatal hernia. The stomach is nondistended. There is no bowel inflammation, obstruction or wall thickening. No evidence of focal bowel lesion. Normal appendix. Vascular/Lymphatic: Aortic atherosclerosis without aneurysm. No retroperitoneal, upper abdominal, mesenteric, or pelvic adenopathy. Reproductive: Unchanged right ovarian cyst measuring 6.8 cm. Uterus is normal. Left ovary is quiescent. Other: No ascites. No free air. No evidence of omental, peritoneal or soft tissue deposit. Musculoskeletal: Severe per chronic compression fracture of T10 is partially included. Unchanged L1 and L5 compression fractures. Inferior endplate compression versus prominent Schmorl's node of L4, unchanged. No blastic or destructive lytic lesions. Prior right hip pinning. Subchondral sclerosis in the right femoral head suggests avascular necrosis, chronic. Remote right lower rib fracture. IMPRESSION: 1. No evidence of metastatic disease in the abdomen or pelvis. 2. Right ovarian cyst measuring 6.8 cm, unchanged from November 2017 CT. Recommend pelvic ultrasound characterization, on a  nonemergent basis. This recommendation follows ACR consensus guidelines: White Paper of the ACR Incidental Findings Committee II on Adnexal Findings. J Am Coll Radiol [phone] 3. Moderate hiatal hernia.  Pneumobilia, sequela of prior ERCP. 4.  Aortic Atherosclerosis (ICD10-I70.0). 5. Unchanged compression fractures in the lower thoracic and lumbar spine. Electronically Signed   By: MJeb LeveringM.D.   On: 12/03/2016 18:48   Dg Chest Port 1 View  Result Date: 12/02/2016 CLINICAL DATA:  Follow-up complete atelectasis of the left lung and left pleural effusion.  EXAM: PORTABLE CHEST 1 VIEW COMPARISON:  12/01/2016, 11/30/2016 and earlier, including CT chest 11/29/2016. FINDINGS: Complete opacification of the left hemithorax, shown on the CT to represent a combination of complete left lung atelectasis and left pleural effusion, unchanged. Compensatory hyperinflation of the right lung which remains clear. No right pleural effusion. Cardiac silhouette normal in size. Extensive thoracic aortic atherosclerosis. IMPRESSION: 1. Stable complete opacification of left hemithorax shown on the recent CT to represent a combination of left lung atelectasis and left pleural effusion. 2. No acute disease involving the right lung. 3.  Aortic Atherosclerosis (ICD10-170.0) Electronically Signed   By: Evangeline Dakin M.D.   On: 12/02/2016 07:27   Dg Chest Port 1 View  Result Date: 12/01/2016 CLINICAL DATA:  Atelectasis. EXAM: PORTABLE CHEST 1 VIEW COMPARISON:  Chest x-ray 8/31 and 8/30.  CT of the chest 11/29/2016 FINDINGS: Persistent complete collapse of the left lung remains. The patient is rotated to the left. Compensatory hyperinflation of the right lung is noted. Atherosclerotic changes are present at the aortic arch. IMPRESSION: 1. Ongoing collapse of the left lung with leftward mediastinal shift. Electronically Signed   By: San Morelle M.D.   On: 12/01/2016 07:49   Dg Chest Port 1 View  Result Date:  11/30/2016 CLINICAL DATA:  Hemoptysis EXAM: PORTABLE CHEST 1 VIEW COMPARISON:  CT chest dated 11/29/2016 FINDINGS: Complete left lung collapse, better visualized on CT. Associated leftward cardiomediastinal shift. Right lung is clear. No pneumothorax is seen. IMPRESSION: Complete left lung collapse, better visualized on CT. Associated leftward cardiomediastinal shift. Electronically Signed   By: Julian Hy M.D.   On: 11/30/2016 07:58      IMPRESSION/PLAN: 1. 83 y.o. female with what appears to have at least cT3N0 NSCLC, suspect squamous cell carcinoma. We've met with the patient to discuss the findings radiographically and the utility of palliative radiotherapy to try and help to open up the left lung. We discussed the role for meeting with medical oncology as well as she's not a surgical candidate per internal medicine. We discussed the risks, benefits, short, and long term effects of radiotherapy and Dr. Tammi Klippel recommends a course of 10 fractions. We will proceed today with simulation and begin treatment Friday 12/07/16. I've discussed with Dr. Dyann Kief that we can coordinate with her SNF at discharge to continue her treatments as well when she's medically stable for discharge. Written consent is obtained and placed in the chart, a copy was provided to the patient.     Carola Rhine, PAC

## 2016-12-07 NOTE — Discharge Summary (Signed)
Physician Discharge Summary  Paula Contreras KKX:[id number] DOB: October 06, 1933 DOA: 11/29/2016  PCP: Josetta Huddle, MD  Admit date: 11/29/2016 Discharge date: 12/07/2016  Time spent: 35 minutes  Recommendations for Outpatient Follow-up:  1. Repeat CBC to follow hemoglobin trend 2. Repeat basic metabolic panel to follow electrolytes and renal function   Discharge Diagnoses:  Active Problems:   COPD (chronic obstructive pulmonary disease) (HCC)   Aortic stenosis   PAF (paroxysmal atrial fibrillation) (HCC)   CKD (chronic kidney disease), stage III   Acute on chronic respiratory failure with hypoxia (HCC)   Diastolic dysfunction   Acute respiratory failure (HCC)   Atelectasis   Hemoptysis   Mass of left lung   Palliative care by specialist   Squamous cell lung cancer, left [facility])   Discharge Condition: Stable and improved. No further episode of hemoptysis, no nausea, no vomiting. Patient has been discharged back to assisted living facility with hospice follow-up and arranged outpatient radiation therapy for no cancer.  Diet recommendation: heart healthy   Filed Weights   12/05/16 0520 12/06/16 0500 12/07/16 0450  Weight: 65.2 kg (143 lb 11.2 oz) 63.6 kg (140 lb 4.8 oz) 63.6 kg (140 lb 3.2 oz)    History of present illness:  As per H&P dictated by Dr. Hal Hope on 11/29/16 83 y.o. female with history of COPD and paroxysmal atrial fibrillation presents to the ER with complaints of hemoptysis. Patient states that she has been coughing up blood since morning. Denies any chest pain shortness of breath fever chills.   ED Course: In the ER patient is found to be hypoxic and CT scan of the chest and shows subtotal of x-rays of the left lung with fluid and mucus in the left mainstem bronchus. Pulmonologist on-call has been consulted and patient was started on empiric antibiotics and admitted for further management and may need bronchoscopy.  Hospital Course:  Acute on chronic resp failure  with hypoxia/hemoptysis    -patient uses 2L [place] at home and presented with worsening breathing and increase in oxygen supplementation; now resolved and back to baseline. -no further hemoptysis appreciated -s/p bronchoscopy and found with large lung Mass obstructing left mainstem -biopsy results back and demonstrating invasive squamous lung cancer; radiation oncology and palliative care consulted. -plan is for patient to go to ALF with hospice follow up and palliative radiation therapy.   COPD with chronic bronchitis -stable currently -will continue inhaler/nebs therapy -mild end exp wheezing appreciated.  Invasive squamous cell Lung carcinoma -s/p bronchoscopy and biopsy demonstrating invasive squamous cell cancer affecting left mainstem and provoking essentially complete left lung collapse.  -radiation oncology consulted for palliative radiation as recommended by pulmonologist  -patient is frail and not a great candidate for chemotherapy or more invasive treatment   -radiation therapy will initiate on 12/10/16  Chronic PAF -CHADsVASC score 2-3 -holding xarelto indefinitely now due to hemoptysis and increase chances of bleeding from friable lung mass. -patient's rate has remained controlled -will continue monitoring on telemetry   Positive MRSA screening -patient was kept on contact precaution -chlorhexidine and mupirocin given    Physical deconditioning  -PT/OT recommending SNF; but patient refusing -will discharge back to ALF ([facility]) with hospice of [facility] follow up    Moderate protein calorie malnutrition  -continue feeding supplement    Procedures:  Bronchoscopy 9/4 (biopsy result from this procedure demonstrated invasive squamous cell carcinoma obstructing left mainstem; big and very friable mass).  Consultations:  PCCM  Radiation oncology  Palliative care  Discharge Exam: Vitals:  12/07/16 0923 12/07/16 1343  BP:  106/62  Pulse:  69  Resp:  16   Temp:  98.7 F (37.1 C)  SpO2: 98% 98%    General: afebrile, no CP, no nausea, no vomiting. No acute distress and Stable breathing. No further hemoptysis reported or appreciated. Patient is very hard of hearing.   Cardiovascular: no rubs, no gallops, no murmurs, rate controlled.  Respiratory: slight exp wheezing, no crackles, decrease d BS on the left lung field, normal resp effort, no using accessory muscles.   Abdomen: soft, NT, ND, positive bowel sounds  Musculoskeletal: no edema, no cyanosis     Discharge Instructions   Discharge Instructions    Diet - low sodium heart healthy    Complete by:  As directed    Discharge instructions    Complete by:  As directed    Take medications as prescribed  Maintain adequate hydration Follow up with radiation oncologist (Dr. Lisbeth Renshaw) on 9/10 for initiation of radiation therapy.     Current Discharge Medication List    START taking these medications   Details  benzonatate (TESSALON) 100 MG capsule Take 1 capsule (100 mg total) by mouth 3 (three) times daily as needed for cough. Qty: 20 capsule, Refills: 0      CONTINUE these medications which have NOT CHANGED   Details  albuterol (PROAIR HFA) 108 (90 BASE) MCG/ACT inhaler Inhale 2 puffs into the lungs every 4 (four) hours as needed for wheezing or shortness of breath. Qty: 1 Inhaler, Refills: 3    alendronate (FOSAMAX) 70 MG tablet Take 70 mg by mouth every Monday. Take with a full glass of water on an empty stomach.    calcium carbonate (OS-CAL) 600 MG TABS tablet Take 2 tablets by mouth daily with breakfast.     Cholecalciferol (VITAMIN D) 2000 UNITS CAPS Take 2,000 Units by mouth daily with breakfast.     esomeprazole (NEXIUM) 20 MG capsule Take 20 mg by mouth daily at 12 noon.    feeding supplement (BOOST / RESOURCE BREEZE) LIQD Take 1 Container by mouth every evening.     fluticasone (FLONASE) 50 MCG/ACT nasal spray Place 1 spray into both nostrils daily. Refills: 2     levalbuterol (XOPENEX) 0.63 MG/3ML nebulizer solution Take 3 mLs (0.63 mg total) by nebulization every 6 (six) hours as needed for wheezing or shortness of breath.    Multiple Vitamins-Iron (MULTIVITAMIN/IRON PO) Take 1 tablet by mouth daily.    OXYGEN Inhale 2-4 L into the lungs continuous. To keep O2 stats >90%    senna-docusate (SENOKOT-S) 8.6-50 MG tablet Take 1 tablet by mouth at bedtime as needed for mild constipation.    Tiotropium Bromide Monohydrate (SPIRIVA RESPIMAT) 2.5 MCG/ACT AERS Inhale 2 puffs into the lungs daily.    acetaminophen (TYLENOL) 325 MG tablet Take 650 mg by mouth every 6 (six) hours as needed (for pain).       STOP taking these medications     rivaroxaban (XARELTO) 20 MG TABS tablet        Allergies  Allergen Reactions  . Codeine Nausea And Vomiting  . Robitussin (Alcohol Free) [Guaifenesin] Other (See Comments)    Per MAR   Follow-up Information    Josetta Huddle, MD. Schedule an appointment as soon as possible for a visit in 2 week(s).   Specialty:  Internal Medicine Contact information: [address] [place] [address] [place] [address] [phone]           The results  of significant diagnostics from this hospitalization (including imaging, microbiology, ancillary and laboratory) are listed below for reference.    Significant Diagnostic Studies: Dg Chest 2 View  Result Date: 11/29/2016 CLINICAL DATA:  Hemoptysis.  History of pneumonia. EXAM: CHEST  2 VIEW COMPARISON:  03/10/2016 FINDINGS: The cardiomediastinal silhouette is obscured by a large left pleural effusion. There is severe leftward mediastinal shift. Calcific atherosclerotic disease of the aorta noted. The right lung is clear.  The left lung is not visualized. Compression deformity of T10 and L1 vertebral bodies, stable from prior CT dated November 2017. Soft tissues are grossly normal. IMPRESSION: Severe leftward mediastinal shift with deviation of the carina to the midline of  the left hemithorax. Large left pleural effusion. Left lung is not visualized. Possible left lung collapse. Clear right lung. Further evaluation with chest CT with contrast is recommended to establish the reason for severe leftward mediastinal shift. These results were called by telephone at the time of interpretation on 11/29/2016 at 4:38 pm to Dr. Nanda Quinton , who verbally acknowledged these results. Electronically Signed   By: Fidela Salisbury M.D.   On: 11/29/2016 16:39   Ct Head Wo Contrast  Result Date: 12/03/2016 CLINICAL DATA:  84 year old female with possible obstructing left lung mass on recent chest CT. EXAM: CT HEAD WITHOUT CONTRAST TECHNIQUE: Contiguous axial images were obtained from the base of the skull through the vertex without intravenous contrast. COMPARISON:  Head CT without contrast 02/19/2015 FINDINGS: Brain: Cerebral volume remains normal for age. No midline shift, mass effect, or evidence of intracranial mass lesion. No ventriculomegaly. No acute intracranial hemorrhage identified. Patchy and subcortical bilateral cerebral white matter hypodensity is stable since 2016. Involvement of the deep white matter capsules and mild for age bilateral basal ganglia heterogeneity. No cortically based acute infarct identified. No cortical encephalomalacia identified. Vascular: Calcified atherosclerosis at the skull base. Skull: Skull bone mineralization is stable and within normal limits. No acute or suspicious osseous lesion identified. Sinuses/Orbits: Regressed ethmoid mucosal thickening since 2016. Visualized paranasal sinuses and mastoids are well pneumatized. Other: Visible orbit and scalp soft tissues are stable and negative. IMPRESSION: 1.  No acute intracranial abnormality. Note that early metastatic disease to the brain cannot be excluded in the absence of intravenous contrast. 2. Chronic probable small vessel disease related cerebral white matter changes are stable since 2016.  Electronically Signed   By: Genevie Ann M.D.   On: 12/03/2016 17:42   Ct Chest W Contrast  Addendum Date: 11/30/2016   ADDENDUM REPORT: 11/30/2016 08:23 ADDENDUM: A voice recognition error is present within the impression of the initial report. The impression should state: IMRESSION: Subtotal atelectasis of LEFT lung with extensive fluid/mucus throughout LEFT mainstem bronchus, LEFT lower lobe bronchus and extending into subsegmental bronchi in both the upper and lower lobes. A discrete obstructing mass is not visualized but is not completely excluded; consider followup BRONCHOSCOPY. Associated small LEFT pleural effusion. Underlying COPD changes RIGHT lung. Extensive atherosclerotic disease including coronary artery calcifications. Small hiatal hernia. Aortic Atherosclerosis (ICD10-I70.0) and Emphysema (ICD10-J43.9). Electronically Signed   By: Lavonia Dana M.D.   On: 11/30/2016 08:23   Result Date: 11/30/2016 CLINICAL DATA:  Abnormal chest radiograph with LEFT lung collapse and mediastinal shift to the LEFT EXAM: CT CHEST WITH CONTRAST TECHNIQUE: Multidetector CT imaging of the chest was performed during intravenous contrast administration. Sagittal and coronal MPR images reconstructed from axial data set. CONTRAST:  63mL ISOVUE-300 IOPAMIDOL (ISOVUE-300) INJECTION 61% IV. COMPARISON:  Chest radiograph 11/29/2016 FINDINGS:  Cardiovascular: Atherosclerotic calcifications aorta, proximal great vessels, and coronary arteries. Thoracic aorta normal caliber. Thoracic vascular structures grossly patent on nondedicated exam. Minimal pericardial effusion. Heart size normal. Mediastinum/Nodes: Mediastinal shift to the LEFT. Moderate-sized hiatal hernia. Question wall thickening of distal esophagus and gastroesophageal junction, versus artifact from incomplete distention. Scattered normal size mediastinal lymph nodes. No thoracic adenopathy. Base of cervical region unremarkable. Lungs/Pleura: Small LEFT pleural effusion.  Subtotal atelectasis of LEFT lung with fluid filling the LEFT mainstem and LEFT lower lobe bronchi as well as more peripheral portions of LEFT upper and LEFT lower lobe bronchi. Minimal residual aeration of LEFT upper lobe. No discrete mass or obstructing lesion is delineated. Underlying emphysematous changes of the RIGHT lung with central peribronchial thickening but no definite infiltrate. No RIGHT pleural effusion or mass. Upper Abdomen: Pneumobilia consistent with history of prior ERCP. Remaining visualized upper abdomen unremarkable. Musculoskeletal: Diffuse osseous demineralization. Marked compression deformities of T10 and L1 vertebral bodies. Old healed BILATERAL rib fractures. IMPRESSION: Subtotal atelectasis of LEFT lung with extensive fluid/mucous throughout LEFT mainstem bronchus, LEFT lower lobe bronchus and extending into subsegmental bronchi in both the upper and lower lobes. A discrete obstructing mass is not visualized but is not completely excluded ; consider follow-up proctoscopy. Associated small LEFT pleural effusion. Underlying COPD changes RIGHT lung. Extensive atherosclerotic disease including coronary artery calcifications. Small hiatal hernia. Aortic Atherosclerosis (ICD10-I70.0) and Emphysema (ICD10-J43.9). Electronically Signed: By: Lavonia Dana M.D. On: 11/29/2016 18:51   Ct Abdomen Pelvis W Contrast  Result Date: 12/03/2016 CLINICAL DATA:  Large fungating mass and left mainstem bronchus completely occluded. Cancer workup, evaluate for metastatic disease. Lung cancer screen, high risk, >= 20 pack-year hx of smoking, and one additional risk factor other than second-hand smoke, initial workup. EXAM: CT ABDOMEN AND PELVIS WITH CONTRAST TECHNIQUE: Multidetector CT imaging of the abdomen and pelvis was performed using the standard protocol following bolus administration of intravenous contrast. CONTRAST:  115mL ISOVUE-300 IOPAMIDOL (ISOVUE-300) INJECTION 61% COMPARISON:  Chest CT  11/29/2016.  Abdominal CT 02/25/2016 FINDINGS: Lower chest: Left lung volume loss with atelectasis in left pleural effusion, assessed on recent chest CT. No right pleural effusion or basilar consolidation. Moderate hiatal hernia. Hepatobiliary: No focal hepatic lesion. Pneumobilia with air in the gallbladder, intra and extrahepatic biliary tree, likely sequela of prior ERCP. No pericholecystic inflammation. Pancreas: Mild parenchymal atrophy. No ductal dilatation or inflammation. No evidence of focal lesion. Spleen: Normal in size without focal abnormality. Adrenals/Urinary Tract: Mild left adrenal thickening without discrete nodule. The right adrenal gland is normal. No hydronephrosis or perinephric edema. Homogeneous renal enhancement with symmetric excretion on delayed phase imaging. No focal renal lesion. Urinary bladder is physiologically distended. No bladder wall thickening. Stomach/Bowel: In moderate hiatal hernia. The stomach is nondistended. There is no bowel inflammation, obstruction or wall thickening. No evidence of focal bowel lesion. Normal appendix. Vascular/Lymphatic: Aortic atherosclerosis without aneurysm. No retroperitoneal, upper abdominal, mesenteric, or pelvic adenopathy. Reproductive: Unchanged right ovarian cyst measuring 6.8 cm. Uterus is normal. Left ovary is quiescent. Other: No ascites. No free air. No evidence of omental, peritoneal or soft tissue deposit. Musculoskeletal: Severe per chronic compression fracture of T10 is partially included. Unchanged L1 and L5 compression fractures. Inferior endplate compression versus prominent Schmorl's node of L4, unchanged. No blastic or destructive lytic lesions. Prior right hip pinning. Subchondral sclerosis in the right femoral head suggests avascular necrosis, chronic. Remote right lower rib fracture. IMPRESSION: 1. No evidence of metastatic disease in the abdomen or pelvis. 2. Right ovarian  cyst measuring 6.8 cm, unchanged from November 2017  CT. Recommend pelvic ultrasound characterization, on a nonemergent basis. This recommendation follows ACR consensus guidelines: White Paper of the ACR Incidental Findings Committee II on Adnexal Findings. J Am Coll Radiol [phone]. 3. Moderate hiatal hernia.  Pneumobilia, sequela of prior ERCP. 4.  Aortic Atherosclerosis (ICD10-I70.0). 5. Unchanged compression fractures in the lower thoracic and lumbar spine. Electronically Signed   By: Jeb Levering M.D.   On: 12/03/2016 18:48   Dg Chest Port 1 View  Result Date: 12/02/2016 CLINICAL DATA:  Follow-up complete atelectasis of the left lung and left pleural effusion. EXAM: PORTABLE CHEST 1 VIEW COMPARISON:  12/01/2016, 11/30/2016 and earlier, including CT chest 11/29/2016. FINDINGS: Complete opacification of the left hemithorax, shown on the CT to represent a combination of complete left lung atelectasis and left pleural effusion, unchanged. Compensatory hyperinflation of the right lung which remains clear. No right pleural effusion. Cardiac silhouette normal in size. Extensive thoracic aortic atherosclerosis. IMPRESSION: 1. Stable complete opacification of left hemithorax shown on the recent CT to represent a combination of left lung atelectasis and left pleural effusion. 2. No acute disease involving the right lung. 3.  Aortic Atherosclerosis (ICD10-170.0) Electronically Signed   By: Evangeline Dakin M.D.   On: 12/02/2016 07:27   Dg Chest Port 1 View  Result Date: 12/01/2016 CLINICAL DATA:  Atelectasis. EXAM: PORTABLE CHEST 1 VIEW COMPARISON:  Chest x-ray 8/31 and 8/30.  CT of the chest 11/29/2016 FINDINGS: Persistent complete collapse of the left lung remains. The patient is rotated to the left. Compensatory hyperinflation of the right lung is noted. Atherosclerotic changes are present at the aortic arch. IMPRESSION: 1. Ongoing collapse of the left lung with leftward mediastinal shift. Electronically Signed   By: San Morelle M.D.   On:  12/01/2016 07:49   Dg Chest Port 1 View  Result Date: 11/30/2016 CLINICAL DATA:  Hemoptysis EXAM: PORTABLE CHEST 1 VIEW COMPARISON:  CT chest dated 11/29/2016 FINDINGS: Complete left lung collapse, better visualized on CT. Associated leftward cardiomediastinal shift. Right lung is clear. No pneumothorax is seen. IMPRESSION: Complete left lung collapse, better visualized on CT. Associated leftward cardiomediastinal shift. Electronically Signed   By: Julian Hy M.D.   On: 11/30/2016 07:58    Microbiology: Recent Results (from the past 240 hour(s))  MRSA PCR Screening     Status: Abnormal   Collection Time: 11/30/16  4:10 AM  Result Value Ref Range Status   MRSA by PCR POSITIVE (A) NEGATIVE Final    Comment:        The GeneXpert MRSA Assay (FDA approved for NASAL specimens only), is one component of a comprehensive MRSA colonization surveillance program. It is not intended to diagnose MRSA infection nor to guide or monitor treatment for MRSA infections. RESULT CALLED TO, READ BACK BY AND VERIFIED WITH: C.WOODARD, RN 11/30/16 0636 L.CHAMPION      Labs: Basic Metabolic Panel:  Recent Labs Lab 12/01/16 0557 12/03/16 0913  NA 138 139  K 4.2 4.5  CL 100* 102  CO2 32 29  GLUCOSE 92 90  BUN 15 10  CREATININE 1.10* 1.04*  CALCIUM 9.1 8.9  MG  --  1.9   CBC:  Recent Labs Lab 12/01/16 0557 12/02/16 0239 12/03/16 0913  WBC 7.7 7.2 7.4  NEUTROABS  --   --  5.4  HGB 7.9* 7.6* 7.8*  HCT 27.2* 25.7* 27.4*  MCV 81.4 80.8 83.0  PLT 332 339 344   BNP (last 3 results)  Recent Labs  02/16/16 1850 03/07/16 2349  BNP 109.8* 200.3*   CBG:  Recent Labs Lab 12/06/16 1131 12/06/16 1847 12/06/16 2035 12/07/16 0740 12/07/16 1111  GLUCAP 95 116* 102* 92 94    Signed:  [name] MD.  [facility] 12/07/2016, 2:42 PM

## 2016-12-07 NOTE — Progress Notes (Signed)
Patient will discharge back to [facility]. Anticipated discharge date: 12/07/16 Family notified: [name], sister-in-law and POA Transportation by: PTAR  Nurse to call report to [phone].   CSW signing off.  Estanislado Emms, [place]  Clinical Social Worker

## 2016-12-07 NOTE — Progress Notes (Signed)
  Radiation Oncology         [phone] ________________________________  Name: Paula Contreras MRN: [id number]  Date: 12/06/2016  DOB: September 08, 1933  Inpatient  SIMULATION AND TREATMENT PLANNING NOTE    ICD-10-CM   1. Malignant neoplasm of hilus of left lung (HCC) C34.02     DIAGNOSIS:  83 yo woman with presumed primary cancer of the left lung obstructing the left mainstem bronchus  NARRATIVE:  The patient was brought to the [facility].  Identity was confirmed.  All relevant records and images related to the planned course of therapy were reviewed.  The patient freely provided informed written consent to proceed with treatment after reviewing the details related to the planned course of therapy. The consent form was witnessed and verified by the simulation staff.  Then, the patient was set-up in a stable reproducible  supine position for radiation therapy.  CT images were obtained.  Surface markings were placed.  The CT images were loaded into the planning software.  Then the target and avoidance structures were contoured.  Treatment planning then occurred.  The radiation prescription was entered and confirmed.  Then, I designed and supervised the construction of a total of 4 medically necessary complex treatment devices with on vac-loc positioner and 3 MLCs to spare the lungs and heart.  I have requested : 3D Simulation  I have requested a DVH of the following structures: heart, right lung, spinal cord and target.    PLAN:  The patient will receive 20 Gy in 5 fractions.  ________________________________  Sheral Apley Tammi Klippel, M.D.

## 2016-12-07 NOTE — Progress Notes (Signed)
Physical Therapy Treatment Patient Details Name: Paula Contreras MRN: [id number] DOB: 03/03/1934 Today's Date: 12/07/2016    History of Present Illness Patient is a 83 y/o female who presents with hemoptysis and Acute hypoxic respiratory failure. Bronchoscopy revealed Completely obstructed left mainstem bronchus. PMH includes dementia, A-fib, chronic diastolic CHF, HTN, COPD on 02 at baseline, CKD stage III.    PT Comments    Pt alert and oriented on arrival. Pt not overly thrilled with therapy before going for radiation but agreeable to limited mobility with return to bed. Pt encouraged to continue mobility daily with nursing and to be OOB daily as well as continue bil LE movement. Will continue to follow.     Follow Up Recommendations  SNF;Supervision for mobility/OOB;Supervision/Assistance - 24 hour     Equipment Recommendations       Recommendations for Other Services       Precautions / Restrictions Precautions Precautions: Fall    Mobility  Bed Mobility Overal bed mobility: Needs Assistance Bed Mobility: Supine to Sit;Sit to Supine     Supine to sit: Min guard Sit to supine: Min guard   General bed mobility comments: pt with increased time and use of rail with ability to rise to sitting with min assist to fully scoot to EOB. Mod assist to scoot to [facility]  Transfers Overall transfer level: Needs assistance   Transfers: Sit to/from Stand Sit to Stand: Min assist         General transfer comment: cues for hand placement with min assist to rise  Ambulation/Gait Ambulation/Gait assistance: Min assist Ambulation Distance (Feet): 12 Feet Assistive device: 2 person hand held assist Gait Pattern/deviations: Step-to pattern   Gait velocity interpretation: Below normal speed for age/gender General Gait Details: pt side stepped at EOB to and from [facility] 3 x with bil UE support on bil PT elbows, cues to look up. Encouragement to maximize function, limited by  fatigue   Stairs            Wheelchair Mobility    Modified Rankin (Stroke Patients Only)       Balance Overall balance assessment: Needs assistance   Sitting balance-Leahy Scale: Good       Standing balance-Leahy Scale: Poor                              Cognition Arousal/Alertness: Awake/alert Behavior During Therapy: WFL for tasks assessed/performed Overall Cognitive Status: History of cognitive impairments - at baseline                                 General Comments: sister-in-law present to confirm pt at baseline.       Exercises General Exercises - Lower Extremity Long Arc Quad: AROM;Both;Seated;5 reps    General Comments        Pertinent Vitals/Pain Pain Assessment: No/denies pain    Home Living                      Prior Function            PT Goals (current goals can now be found in the care plan section) Progress towards PT goals: Progressing toward goals    Frequency           PT Plan Current plan remains appropriate    Co-evaluation  AM-PAC PT "6 Clicks" Daily Activity  Outcome Measure  Difficulty turning over in bed (including adjusting bedclothes, sheets and blankets)?: None Difficulty moving from lying on back to sitting on the side of the bed? : A Little Difficulty sitting down on and standing up from a chair with arms (e.g., wheelchair, bedside commode, etc,.)?: Unable Help needed moving to and from a bed to chair (including a wheelchair)?: A Lot Help needed walking in hospital room?: A Lot Help needed climbing 3-5 steps with a railing? : Total 6 Click Score: 13    End of Session Equipment Utilized During Treatment: Gait belt;Oxygen Activity Tolerance: Patient tolerated treatment well;Patient limited by fatigue Patient left: in bed;with call bell/phone within reach;with bed alarm set;with family/visitor present Nurse Communication: Mobility status PT Visit  Diagnosis: Unsteadiness on feet (R26.81);Muscle weakness (generalized) (M62.81)     Time: 2330-0762 PT Time Calculation (min) (ACUTE ONLY): 18 min  Charges:  $Therapeutic Activity: 8-22 mins                    G Codes:       Elwyn Reach, PT [phone]    [place] 12/07/2016, 1:42 PM

## 2016-12-07 NOTE — Progress Notes (Signed)
Called report to the admitting nurse at [facility] facility, have answered all the questions.  Ferdinand Lango, RN

## 2016-12-07 NOTE — Telephone Encounter (Signed)
Called Ceasar, RN on 3W and advised him that patients radiation appointment for today has been canceled per Dr. Tammi Klippel.  Advised that she will start on Monday.  Also called [facility] and canceled transportation for today.

## 2016-12-07 NOTE — Consult Note (Addendum)
Consultation Note Date: 12/07/2016   Patient Name: Paula Contreras  DOB: January 05, 1934  MRN: [id number]  Age / Sex: 83 y.o., female  PCP: Josetta Huddle, MD Referring Physician: [name], MD  Reason for Consultation: Establishing goals of care, Hospice Evaluation and Psychosocial/spiritual support  HPI/Patient Profile: 83 y.o. female  with past medical history of 02 dependency 5 years, anemia, aortic stenosis, chronic kidney disease stage III, COPD, dementia, hypertension, osteoporosis, paroxysmal atrial fibrillation, history of hip fracture admitted on 11/29/2016 with hemoptysis and shortness of breath. She was found to have a new obstructing mass to her main bronchus stem that is now felt to be invasive squamous cell carcinoma. Consult ordered for goals of care.   Clinical Assessment and Goals of Care: Met with patient to begin discussions regarding goals of care in the setting of new finding of lung mass, invasive squamous cell carcinoma. Patient does appreciate that she has a mass, that it is probably cancerous. She is agreeable to going for palliative radiation. I did reiterate that that meant that her cancer was not curable; "it's my time it's my time". Patient reports a steady functional decline over the past several years which is listed her to sell her home in [place] and [facility] [place]. She describes a typical day there as being able to walk only very short distances, she is incontinent of bladder and bowel, and spends the majority of time in a chair in the facility, and her room.  She also shares that her father died from lung cancer as well as an uncle. She does seem to appreciate the terminal nature of a cancer diagnosis. As noted she is agreeable for palliative radiation treatment with a goal to return to her current living  situation at [facility] assisted [facility] facility  Patient's sister-in-law, [name], as her emergency contact, HCPOA. Met with pt and [name]  .Patient is making some of her own decisions at this point but does show limited insight in terms of comprehending the bigger picture of what to expect going forward. She does state that these are not topics that she is comfortable talking about but does seem to appreciate on some level that this is a terminal diagnoses. She was able to reiterate to me that her father only lives 9 months after his diagnosis of lung cancer    SUMMARY OF RECOMMENDATIONS   Support DNR/DNI Patient wishes to try palliative radiation treatments regardless of risks Met with social worker, she will reach out to [facility] to see if they can transport her over the next 2 weeks to palliative radiation. Her first treatment is today I did speak with [facility], which is one of the hospice agencies that serve her area and they do take people with palliative radiation under the hospice Medicare benefit but would not pay for her transportation to XRT At this point though I would plan for patient to return to [facility], go through her 2  weeks of palliative radiation if she can tolerate this and follow in the community by palliative medicine. Palliative Medicine to assist with this referral Pt's SIL would like to go back to facility with hospice support  Code Status/Advance Care Planning:  DNR    Symptom Management:   Dyspnea: Cont with palliative XRT, targeted pulmonary treatment. If opioids are indicated, would recommend low-dose morphine concentrate at 2.5-5 mg every 3 hours as needed for shortness of breath  Palliative Prophylaxis:   Bowel Regimen, Delirium Protocol, Eye Care, Frequent Pain Assessment, Oral Care and Turn Reposition  Additional Recommendations (Limitations, Scope, Preferences):  No Surgical Procedures and No  Tracheostomy  Psycho-social/Spiritual:   Desire for further Chaplaincy support:no  Additional Recommendations: Referral to Community Resources   Prognosis:   < 6 months in the setting of poor functional baseline status, COPD on home 02, new invasive squamous cell mass at left main bronchus. Pt is at high risk for acute cardiopulmonary failure as well as inability to tolerate XRT, new friable mass  Discharge Planning: Back to ALF with hospice support     Primary Diagnoses: Present on Admission: . Acute on chronic respiratory failure with hypoxia ([place]) . CKD (chronic kidney disease), stage III . COPD (chronic obstructive pulmonary disease) ([place]) . Diastolic dysfunction . PAF (paroxysmal atrial fibrillation) ([place]) . Acute respiratory failure ([place])   I have reviewed the medical record, interviewed the patient and family, and examined the patient. The following aspects are pertinent.  Past Medical History:  Diagnosis Date  . Anemia 02/20/2015  . Aortic stenosis    a. 2D Echo 07/2013: EF 60-65%, no RWMA, grade 1 DD, normal LV filling pressure, moderate AS, mild TR, PASP 50mHg.  . Bradycardia    a. possible bradycardia with HR 44 by physical exam notes at nephrologist office in 03/2014. F/u event monitor showed NSR PACs average HR 81, bradycardia <1% of readable data, no pauses of 3 seconds or longer..  . Cataract   . Chronic respiratory failure ([place]    a. On home O2 since 2012.  . CKD (chronic kidney disease), stage III   . COPD (chronic obstructive pulmonary disease) ([place]   . Dementia   . Hypertension   . Osteoporosis   . Other psoriasis and similar disorders    psoriasis vs eczema  . Paroxysmal atrial fibrillation ([place]    a. Dx 07/2013. No attempts per Epic to restore NSR but in NSR 2016.  .Marland KitchenPneumonia 03/2016  . Skin cancer    Social History   Social History  . Marital status: Widowed    Spouse name: N/A  . Number of children: 0  . Years of education: N/A    Occupational History  . data entry specialist     retired   Social History Main Topics  . Smoking status: Former Smoker    Packs/day: 1.00    Years: 55.00    Types: Cigarettes    Quit date: 09/25/2010  . Smokeless tobacco: Never Used  . Alcohol use No  . Drug use: No  . Sexual activity: Not Asked   Other Topics Concern  . None   Social History Narrative  . None   Family History  Problem Relation Age of Onset  . Asthma Mother   . Kidney disease Mother   . Lung cancer Father   . Asthma Maternal Grandmother   . Lung cancer Paternal Uncle        father's twin brother  . Diabetes Brother  Scheduled Meds: . Chlorhexidine Gluconate Cloth  6 each Topical Q0600  . fluticasone  1 spray Each Nare Daily  . levalbuterol  0.63 mg Nebulization TID  . mupirocin ointment  1 application Nasal BID  . pantoprazole  40 mg Oral Q1200  . tiotropium  18 mcg Inhalation Daily   Continuous Infusions: PRN Meds:.acetaminophen **OR** acetaminophen, benzonatate, ondansetron **OR** ondansetron (ZOFRAN) IV Medications Prior to Admission:  Prior to Admission medications   Medication Sig Start Date End Date Taking? Authorizing Provider  albuterol (PROAIR HFA) 108 (90 BASE) MCG/ACT inhaler Inhale 2 puffs into the lungs every 4 (four) hours as needed for wheezing or shortness of breath. 12/24/14  Yes Collene Gobble, MD  alendronate (FOSAMAX) 70 MG tablet Take 70 mg by mouth every Monday. Take with a full glass of water on an empty stomach.   Yes [provider]  calcium carbonate (OS-CAL) 600 MG TABS tablet Take 2 tablets by mouth daily with breakfast.    Yes [provider]  Cholecalciferol (VITAMIN D) 2000 UNITS CAPS Take 2,000 Units by mouth daily with breakfast.    Yes [provider]  esomeprazole (NEXIUM) 20 MG capsule Take 20 mg by mouth daily at 12 noon.   Yes [provider]  feeding supplement (BOOST / RESOURCE BREEZE) LIQD Take 1 Container by mouth  every evening.    Yes [provider]  fluticasone (FLONASE) 50 MCG/ACT nasal spray Place 1 spray into both nostrils daily. 03/03/16  Yes Eugenie Filler, MD  levalbuterol Penne Lash) 0.63 MG/3ML nebulizer solution Take 3 mLs (0.63 mg total) by nebulization every 6 (six) hours as needed for wheezing or shortness of breath. 03/02/16  Yes Eugenie Filler, MD  Multiple Vitamins-Iron (MULTIVITAMIN/IRON PO) Take 1 tablet by mouth daily.   Yes [provider]  OXYGEN Inhale 2-4 L into the lungs continuous. To keep O2 stats >90%   Yes [provider]  rivaroxaban (XARELTO) 20 MG TABS tablet Take 1 tablet (20 mg total) by mouth daily. Resume in 1 week, 03/09/2016. 03/09/16  Yes Eugenie Filler, MD  senna-docusate (SENOKOT-S) 8.6-50 MG tablet Take 1 tablet by mouth at bedtime as needed for mild constipation. 02/23/15  Yes Theodis Blaze, MD  Tiotropium Bromide Monohydrate (SPIRIVA RESPIMAT) 2.5 MCG/ACT AERS Inhale 2 puffs into the lungs daily.   Yes [provider]  acetaminophen (TYLENOL) 325 MG tablet Take 650 mg by mouth every 6 (six) hours as needed (for pain).     [provider]   Allergies  Allergen Reactions  . Codeine Nausea And Vomiting  . Robitussin (Alcohol Free) [Guaifenesin] Other (See Comments)    Per MAR   Review of Systems  Unable to perform ROS: Other    Physical Exam  Constitutional: She is oriented to person, place, and time. She appears well-developed and well-nourished.  HENT:  Head: Normocephalic and atraumatic.  Neck: Normal range of motion.  Pulmonary/Chest: Effort normal.  Abdominal: Soft.  Neurological: She is alert and oriented to person, place, and time.  Very HOH  Skin: Skin is warm and dry. There is pallor.  Psychiatric:  Affect constricted; limited insight; in terms of discussing how she feels; very concrete thinker  Nursing note and vitals reviewed.   Vital Signs: BP 124/63 (BP Location: Right Arm)   Pulse 69    Temp 98 F (36.7 C) (Oral)   Resp 17   Ht 5' 6"  (1.676 m)   Wt 63.6 kg (140 lb 3.2  oz)   SpO2 98%   BMI 22.63 kg/m  Pain Assessment: No/denies pain POSS *See Group Information*: 1-Acceptable,Awake and alert Pain Score: 0-No pain   SpO2: SpO2: 98 % O2 Device:SpO2: 98 % O2 Flow Rate: .O2 Flow Rate (L/min): 2 L/min  IO: Intake/output summary:  Intake/Output Summary (Last 24 hours) at 12/07/16 1050 Last data filed at 12/07/16 0900  Gross per 24 hour  Intake              120 ml  Output              950 ml  Net             -830 ml    LBM: Last BM Date: 12/04/16 Baseline Weight: Weight: 59 kg (130 lb) (Pt reported) Most recent weight: Weight: 63.6 kg (140 lb 3.2 oz)     Palliative Assessment/Data:   Flowsheet Rows     Most Recent Value  Intake Tab  Referral Department  Hospitalist  Unit at Time of Referral  Med/Surg Unit  Palliative Care Primary Diagnosis  Cancer  Date Notified  12/06/16  Palliative Care Type  New Palliative care  Reason for referral  Clarify Goals of Care, Psychosocial or Spiritual support  Date of Admission  12/30/16  Date first seen by Palliative Care  12/07/16  # of days Palliative referral response time  1 Day(s)  # of days IP prior to Palliative referral  -24  Clinical Assessment  Palliative Performance Scale Score  30%  Pain Max last 24 hours  Not able to report  Pain Min Last 24 hours  Not able to report  Dyspnea Max Last 24 Hours  Not able to report  Dyspnea Min Last 24 hours  Not able to report  Nausea Max Last 24 Hours  Not able to report  Nausea Min Last 24 Hours  Not able to report  Anxiety Max Last 24 Hours  Not able to report  Anxiety Min Last 24 Hours  Not able to report  Other Max Last 24 Hours  Not able to report  Psychosocial & Spiritual Assessment  Palliative Care Outcomes  Patient/Family meeting held?  Yes  Who was at the meeting?  pt  Palliative Care Outcomes  Linked to palliative care logitudinal support  Patient/Family  wishes: Interventions discontinued/not started   Mechanical Ventilation, Trach      Time In: 1020 Time Out: 1105 Time Total: 75 min Greater than 50%  of this time was spent counseling and coordinating care related to the above assessment and plan. Staffed with Dr. Dyann Kief  Signed by: Dory Horn, NP   Please contact Palliative Medicine Team phone at [phone] for questions and concerns.  For individual provider: See Shea Evans

## 2016-12-10 ENCOUNTER — Ambulatory Visit
Admission: RE | Admit: 2016-12-10 | Discharge: 2016-12-10 | Disposition: A | Discharge status: Home / Self Care | Payer: Medicare Other | Source: Ambulatory Visit | Attending: Radiation Oncology | Admitting: Radiation Oncology

## 2016-12-10 ENCOUNTER — Ambulatory Visit: Admission: RE | Admit: 2016-12-10 | Payer: Medicare Other | Source: Ambulatory Visit

## 2016-12-10 DIAGNOSIS — Z7983 Long term (current) use of bisphosphonates: Secondary | ICD-10-CM | POA: Diagnosis not present

## 2016-12-10 DIAGNOSIS — J984 Other disorders of lung: Secondary | ICD-10-CM | POA: Diagnosis not present

## 2016-12-10 DIAGNOSIS — Z9981 Dependence on supplemental oxygen: Secondary | ICD-10-CM | POA: Diagnosis not present

## 2016-12-10 DIAGNOSIS — Z79899 Other long term (current) drug therapy: Secondary | ICD-10-CM | POA: Diagnosis not present

## 2016-12-10 DIAGNOSIS — Z7951 Long term (current) use of inhaled steroids: Secondary | ICD-10-CM | POA: Diagnosis not present

## 2016-12-10 DIAGNOSIS — Z888 Allergy status to other drugs, medicaments and biological substances status: Secondary | ICD-10-CM | POA: Diagnosis not present

## 2016-12-10 DIAGNOSIS — Z885 Allergy status to narcotic agent status: Secondary | ICD-10-CM | POA: Diagnosis not present

## 2016-12-10 DIAGNOSIS — C3402 Malignant neoplasm of left main bronchus: Secondary | ICD-10-CM | POA: Diagnosis not present

## 2016-12-10 DIAGNOSIS — Z510 Encounter for antineoplastic radiation therapy: Secondary | ICD-10-CM | POA: Diagnosis present

## 2016-12-11 ENCOUNTER — Ambulatory Visit
Admission: RE | Admit: 2016-12-11 | Discharge: 2016-12-11 | Disposition: A | Discharge status: Home / Self Care | Payer: Medicare Other | Source: Ambulatory Visit | Attending: Radiation Oncology | Admitting: Radiation Oncology

## 2016-12-11 ENCOUNTER — Telehealth: Payer: Self-pay | Admitting: Radiation Oncology

## 2016-12-11 DIAGNOSIS — Z510 Encounter for antineoplastic radiation therapy: Secondary | ICD-10-CM | POA: Diagnosis not present

## 2016-12-11 NOTE — Telephone Encounter (Signed)
Patient had first dose of radiation therapy yesterday. Received call from Paula Contreras at [facility] today that the patient coughed up thick blood three times s/p radiation. Per Paula Caldron, Paula Contreras phoned Paula Contreras back at [phone] and explained that we suspect the hemoptysis will improve with further radiation therapy but, to continue to monitor the patient closely. It was encouraged that if the hemoptysis become worse/copious the patient should be evaluated at the emergency room. Understanding verbalized. Paula Contreras reports the hemoptysis has stopped since they gave her a breathing treatment.

## 2016-12-12 ENCOUNTER — Ambulatory Visit
Admission: RE | Admit: 2016-12-12 | Discharge: 2016-12-12 | Disposition: A | Discharge status: Home / Self Care | Payer: Medicare Other | Source: Ambulatory Visit | Attending: Radiation Oncology | Admitting: Radiation Oncology

## 2016-12-12 DIAGNOSIS — Z510 Encounter for antineoplastic radiation therapy: Secondary | ICD-10-CM | POA: Diagnosis not present

## 2016-12-13 ENCOUNTER — Ambulatory Visit: Payer: Medicare Other

## 2016-12-13 ENCOUNTER — Ambulatory Visit
Admission: RE | Admit: 2016-12-13 | Discharge: 2016-12-13 | Disposition: A | Discharge status: Home / Self Care | Payer: Medicare Other | Source: Ambulatory Visit | Attending: Radiation Oncology | Admitting: Radiation Oncology

## 2016-12-13 ENCOUNTER — Other Ambulatory Visit: Payer: Self-pay | Admitting: Radiation Oncology

## 2016-12-13 DIAGNOSIS — Z510 Encounter for antineoplastic radiation therapy: Secondary | ICD-10-CM | POA: Diagnosis not present

## 2016-12-13 DIAGNOSIS — C3492 Malignant neoplasm of unspecified part of left bronchus or lung: Secondary | ICD-10-CM

## 2016-12-14 ENCOUNTER — Ambulatory Visit
Admission: RE | Admit: 2016-12-14 | Discharge: 2016-12-14 | Disposition: A | Discharge status: Home / Self Care | Payer: Medicare Other | Source: Ambulatory Visit | Attending: Radiation Oncology | Admitting: Radiation Oncology

## 2016-12-14 ENCOUNTER — Encounter: Payer: Self-pay | Admitting: Radiation Oncology

## 2016-12-14 ENCOUNTER — Ambulatory Visit: Payer: Medicare Other

## 2016-12-14 ENCOUNTER — Telehealth: Payer: Self-pay | Admitting: Radiation Oncology

## 2016-12-14 DIAGNOSIS — Z510 Encounter for antineoplastic radiation therapy: Secondary | ICD-10-CM | POA: Diagnosis not present

## 2016-12-14 NOTE — Telephone Encounter (Signed)
Provided Ashlyn Bruning, PA-C with PHYSICIAN TELEPHONE ORDER sheet to review that had been sent with the patient today from [facility]. Ashlyn completed the order sheet. This RN faxed the order sheet and most recent notes to [phone]. Fax confirmation of delivery obtained.

## 2016-12-17 ENCOUNTER — Ambulatory Visit: Payer: Medicare Other

## 2016-12-18 ENCOUNTER — Ambulatory Visit: Payer: Medicare Other

## 2016-12-19 ENCOUNTER — Telehealth: Payer: Self-pay | Admitting: Radiation Oncology

## 2016-12-19 ENCOUNTER — Ambulatory Visit: Payer: Medicare Other

## 2016-12-19 NOTE — Progress Notes (Signed)
°  Radiation Oncology         [phone] ________________________________  Name: Paula Contreras MRN: [id number]  Date: 12/14/2016  DOB: [birth year]  End of Treatment Note  Diagnosis:   83 yo woman with presumed primary cancer of the left lung obstructing the left mainstem bronchus     Indication for treatment:  Palliative       Radiation treatment dates:   12/10/2016 to 12/14/2016  Site/dose:   The Left mainstem bronchus was treated to 20 Gy in 5 fractions of 4 Gy.  Beams/energy:   Static // 10X, 6X   Narrative: The patient tolerated radiation treatment relatively well.   She denied any pain, fatigue, shortness of breath or swallowing issues. She reported a decent appetite and was able to maintain her weight.  Plan: The patient has completed radiation treatment. The patient will return to radiation oncology clinic for routine followup in one month. I advised her to call or return sooner if she has any questions or concerns related to her recovery or treatment. ________________________________  Sheral Apley. Tammi Klippel, M.D.   This document serves as a record of services personally performed by Tyler Pita, MD. It was created on his behalf by Arlyce Harman, a trained medical scribe. The creation of this record is based on the scribe's personal observations and the provider's statements to them. This document has been checked and approved by the attending provider.

## 2016-12-19 NOTE — Telephone Encounter (Signed)
[name] called wishing to speak with RN about Ms. Filler's care. I have left a msg for London Pepper, RN to return her call.

## 2016-12-20 ENCOUNTER — Ambulatory Visit: Payer: Medicare Other

## 2016-12-24 ENCOUNTER — Telehealth: Payer: Self-pay | Admitting: Radiation Oncology

## 2016-12-24 ENCOUNTER — Other Ambulatory Visit: Payer: Self-pay | Admitting: Radiation Oncology

## 2016-12-24 DIAGNOSIS — C3492 Malignant neoplasm of unspecified part of left bronchus or lung: Secondary | ICD-10-CM

## 2016-12-24 NOTE — Telephone Encounter (Signed)
Several attempts have been made to return [employer] telephone message. Attempted for a fifth time today. No answer. Left another message requesting a return call. Provided my direct line of [phone]

## 2016-12-24 NOTE — Telephone Encounter (Signed)
Finally connected with patient's sister in law. Explained the need for radiation, the process and the expected result. Reviewed upcoming appointment dates and time. Understanding of all verbalized and appreciation for he call.

## 2017-01-15 ENCOUNTER — Ambulatory Visit: Payer: Medicare Other

## 2017-01-15 ENCOUNTER — Ambulatory Visit (HOSPITAL_COMMUNITY)
Admission: RE | Admit: 2017-01-15 | Discharge: 2017-01-15 | Disposition: A | Discharge status: Home / Self Care | Payer: Medicare Other | Source: Ambulatory Visit | Attending: Radiation Oncology | Admitting: Radiation Oncology

## 2017-01-15 ENCOUNTER — Ambulatory Visit
Admission: RE | Admit: 2017-01-15 | Discharge: 2017-01-15 | Disposition: A | Discharge status: Home / Self Care | Payer: Medicare Other | Source: Ambulatory Visit | Attending: Urology | Admitting: Urology

## 2017-01-15 ENCOUNTER — Encounter (HOSPITAL_COMMUNITY): Payer: Self-pay

## 2017-01-15 DIAGNOSIS — C3492 Malignant neoplasm of unspecified part of left bronchus or lung: Secondary | ICD-10-CM | POA: Insufficient documentation

## 2017-01-15 DIAGNOSIS — I700 Atherosclerosis of aorta: Secondary | ICD-10-CM | POA: Insufficient documentation

## 2017-01-15 DIAGNOSIS — M8580 Other specified disorders of bone density and structure, unspecified site: Secondary | ICD-10-CM | POA: Diagnosis not present

## 2017-01-15 DIAGNOSIS — I2510 Atherosclerotic heart disease of native coronary artery without angina pectoris: Secondary | ICD-10-CM | POA: Insufficient documentation

## 2017-01-15 DIAGNOSIS — K449 Diaphragmatic hernia without obstruction or gangrene: Secondary | ICD-10-CM | POA: Diagnosis not present

## 2017-01-15 DIAGNOSIS — X58XXXA Exposure to other specified factors, initial encounter: Secondary | ICD-10-CM | POA: Insufficient documentation

## 2017-01-15 DIAGNOSIS — J9811 Atelectasis: Secondary | ICD-10-CM | POA: Diagnosis not present

## 2017-01-15 DIAGNOSIS — J90 Pleural effusion, not elsewhere classified: Secondary | ICD-10-CM | POA: Diagnosis not present

## 2017-01-15 DIAGNOSIS — M4854XA Collapsed vertebra, not elsewhere classified, thoracic region, initial encounter for fracture: Secondary | ICD-10-CM | POA: Insufficient documentation

## 2017-01-15 DIAGNOSIS — J439 Emphysema, unspecified: Secondary | ICD-10-CM | POA: Diagnosis not present

## 2017-01-15 LAB — BUN AND CREATININE (CC13)
BUN: 13.7 mg/dL (ref 7.0–26.0)
CREATININE: 1 mg/dL (ref 0.6–1.1)
EGFR: 56 mL/min/{1.73_m2} — AB (ref >60)

## 2017-01-15 MED ORDER — IOPAMIDOL (ISOVUE-300) INJECTION 61%
75.0000 mL | Freq: Once | INTRAVENOUS | Status: AC | PRN
  Administered 2017-01-15: 75 mL via INTRAVENOUS

## 2017-01-17 ENCOUNTER — Encounter: Payer: Self-pay | Admitting: *Deleted

## 2017-01-17 DIAGNOSIS — C3492 Malignant neoplasm of unspecified part of left bronchus or lung: Secondary | ICD-10-CM

## 2017-01-17 NOTE — Progress Notes (Signed)
Oncology Nurse Navigator Documentation  Oncology Nurse Navigator Flowsheets 01/17/2017  Navigator Location [facility]  Referral date to RadOnc/MedOnc 01/17/2017  Navigator Encounter Type Telephone;Other/per Dr. Tammi Klippel and Bryson Ha PA, patient needs PET scan and have an appt to see Dr. Julien Nordmann. I called central scheduling and received an appt for patient.  I called [facility] at [phone]. I spoke with Ms. Augsburger's nurse. I updated her on PET scan and an appt with Dr. Julien Nordmann on 01/31/17.  I also gave her pre-procedure instructions and she verbalized understanding  Telephone Outgoing Call  Treatment Phase Pre-Tx/Tx Discussion  Barriers/Navigation Needs Coordination of Care;Education  Education Other  Interventions Coordination of Care;Education  Acuity Level 2  Time Spent with Patient 45

## 2017-01-22 ENCOUNTER — Ambulatory Visit: Payer: Self-pay | Admitting: Urology

## 2017-01-28 ENCOUNTER — Encounter (HOSPITAL_COMMUNITY)
Admission: RE | Admit: 2017-01-28 | Discharge: 2017-01-28 | Disposition: A | Discharge status: Home / Self Care | Payer: Medicare Other | Source: Ambulatory Visit | Attending: Radiation Oncology | Admitting: Radiation Oncology

## 2017-01-28 DIAGNOSIS — C3492 Malignant neoplasm of unspecified part of left bronchus or lung: Secondary | ICD-10-CM | POA: Diagnosis not present

## 2017-01-28 LAB — GLUCOSE, CAPILLARY: GLUCOSE-CAPILLARY: 104 mg/dL — AB (ref 65–99)

## 2017-01-28 MED ORDER — FLUDEOXYGLUCOSE F - 18 (FDG) INJECTION
6.9600 | Freq: Once | INTRAVENOUS | Status: AC | PRN
  Administered 2017-01-28: 6.96 via INTRAVENOUS

## 2017-01-30 ENCOUNTER — Encounter: Payer: Self-pay | Admitting: Urology

## 2017-01-30 ENCOUNTER — Ambulatory Visit
Admission: RE | Admit: 2017-01-30 | Discharge: 2017-01-30 | Disposition: A | Discharge status: Home / Self Care | Payer: Medicare Other | Source: Ambulatory Visit | Attending: Urology | Admitting: Urology

## 2017-01-30 VITALS — BP 142/63 | HR 72 | Temp 98.3°F | Resp 16 | Ht 66.0 in | Wt 136.0 lb

## 2017-01-30 DIAGNOSIS — C3492 Malignant neoplasm of unspecified part of left bronchus or lung: Secondary | ICD-10-CM

## 2017-01-30 DIAGNOSIS — Z510 Encounter for antineoplastic radiation therapy: Secondary | ICD-10-CM | POA: Diagnosis not present

## 2017-01-30 NOTE — Progress Notes (Signed)
Radiation Oncology         [phone] ________________________________  Name: Paula Contreras MRN: [id number]  Date: 01/30/2017  DOB: 1934-02-10  Post Treatment Note  CC: System, Pcp Not In  Paula Dubois, MD  Diagnosis:   83 y.o. female with at least cT3N0 NSCLC, squamous cell carcinoma.  Interval Since Last Radiation:  5 weeks, palliative radiotherapy  12/10/2016 to 12/14/2016:   The Left mainstem bronchus was treated to 20 Gy in 5 fractions of 4 Gy.  Narrative:  The patient returns today for routine follow-up.  In summary, she was initially seen at the request of Dr. Dyann Kief during recent hospital admission for a probable Stage III NSCLC of the left lung. The patient was in her usual state of health until about two weeks prior to admission when she started having more witnessed shortness of breath by staff at the assisted living facility ([facility]) where she resides, as well as an episode of hemoptysis. The patient has been O2 dependant for about 5 years at 2L . She presented to the ED on 11/29/16 and her O2 demands had to be escalated to about 3L. She was treated for acute respiratory failure, seen by pulmonary following imaging suggesting collapse of both left lobes of the lung from obstruction of the mainstem bronchus. She underwent bronchoscopy on 12/04/16 and final pathology confirmed squamous cell carcinoma of the left lung. We were asked to evaluate her for palliative radiotherapy to help relieve the obstruction and control hemoptysis.   She elected to proceed with palliative radiotherapy which was delivered in 5 fractions from 12/10/2016 to 12/14/2016. She tolerated radiation treatment relatively well.  She denied any pain, fatigue, increased shortness of breath or swallowing issues. She reported a decent appetite and was able to maintain her weight throughout treatment.  Since completion of treatment, she has not had any further hemoptysis.  She has not met with medical oncology as of yet  but is scheduled to see Dr. Julien Nordmann on Monday 02/04/17.                             On review of systems, the patient states that she feels well in general. She denies any chest pain, increased shortness of breath, cough, fever, chills or night sweats. She denies abdominal pain, nausea, vomiting, diarrhea or constipation. She continues with generalized fatigue and shortness of breath on exertion. She has continued with a healthy appetite and has been able to maintain her weight.  ALLERGIES:  is allergic to codeine and robitussin (alcohol free) [guaifenesin].  Meds: Current Outpatient Prescriptions  Medication Sig Dispense Refill  . albuterol (PROAIR HFA) 108 (90 BASE) MCG/ACT inhaler Inhale 2 puffs into the lungs every 4 (four) hours as needed for wheezing or shortness of breath. 1 Inhaler 3  . alendronate (FOSAMAX) 70 MG tablet Take 70 mg by mouth every Monday. Take with a full glass of water on an empty stomach.    . benzonatate (TESSALON) 100 MG capsule Take 1 capsule (100 mg total) by mouth 3 (three) times daily as needed for cough. 20 capsule 0  . calcium carbonate (OS-CAL) 600 MG TABS tablet Take 2 tablets by mouth daily with breakfast.     . Cholecalciferol (VITAMIN D) 2000 UNITS CAPS Take 2,000 Units by mouth daily with breakfast.     . esomeprazole (NEXIUM) 20 MG capsule Take 20 mg by mouth daily at 12 noon.    . feeding supplement (  BOOST / RESOURCE BREEZE) LIQD Take 1 Container by mouth every evening.     . fluticasone (FLONASE) 50 MCG/ACT nasal spray Place 1 spray into both nostrils daily.  2  . levalbuterol (XOPENEX) 0.63 MG/3ML nebulizer solution Take 3 mLs (0.63 mg total) by nebulization every 6 (six) hours as needed for wheezing or shortness of breath.    . Multiple Vitamins-Iron (MULTIVITAMIN/IRON PO) Take 1 tablet by mouth daily.    . OXYGEN Inhale 2-4 L into the lungs continuous. To keep O2 stats >90%    . Tiotropium Bromide Monohydrate (SPIRIVA RESPIMAT) 2.5 MCG/ACT AERS Inhale  2 puffs into the lungs daily.    Marland Kitchen acetaminophen (TYLENOL) 325 MG tablet Take 650 mg by mouth every 6 (six) hours as needed (for pain).     Marland Kitchen senna-docusate (SENOKOT-S) 8.6-50 MG tablet Take 1 tablet by mouth at bedtime as needed for mild constipation. (Patient not taking: Reported on 01/30/2017)     No current facility-administered medications for this encounter.     Physical Findings:  height is 5' 6"  (1.676 m) and weight is 136 lb (61.7 kg). Her oral temperature is 98.3 F (36.8 C). Her blood pressure is 142/63 (abnormal) and her pulse is 72. Her respiration is 16 and oxygen saturation is 96%.  Pain Assessment Pain Score: 0-No pain/10 In general this is a well appearing Caucasian female in no acute distress. She's alert and oriented x4 and appropriate throughout the examination. Cardiopulmonary assessment is negative for acute distress and she exhibits normal effort.   Lab Findings: Lab Results  Component Value Date   WBC 7.4 12/03/2016   HGB 7.8 (L) 12/03/2016   HCT 27.4 (L) 12/03/2016   MCV 83.0 12/03/2016   PLT 344 12/03/2016     Radiographic Findings: Ct Chest W Contrast  Result Date: 01/15/2017 CLINICAL DATA:  Followup lung cancer. EXAM: CT CHEST WITH CONTRAST TECHNIQUE: Multidetector CT imaging of the chest was performed during intravenous contrast administration. CONTRAST:  50m ISOVUE-300 IOPAMIDOL (ISOVUE-300) INJECTION 61% COMPARISON:  11/29/2016 FINDINGS: Cardiovascular: Normal heart size. No pericardial effusion. Aortic atherosclerosis. Calcification in the LAD, RCA and left circumflex coronary artery is noted. Mediastinum/Nodes: Moderate size hiatal hernia. Trachea appears patent. No mediastinal or right hilar adenopathy identified. Lungs/Pleura: Moderate to advanced changes of emphysema. The right lung is clear. Decrease in volume of the left pleural effusion. Interval re-expansion and aeration of the entire left lower lobe. Continued complete atelectasis of the left  upper lobe is identified. Margins of the left perihilar lung mass are difficult to identified. Taken from the coronal series the left hilar mass is felt to measure 4.5 x 3.5 cm, image 73 of series 6. This is compared with 4.9 x 5.7 cm previously. Upper Abdomen: Pneumobilia identified.  No acute abnormality noted. Musculoskeletal: The bones appear diffusely osteopenic. Similar appearance of T10 compression fracture, image 74 of series 7. No aggressive lytic or sclerotic bone lesions identified. IMPRESSION: 1. Interval response to therapy. 2. The left perihilar lung mass has decreased in size in the interval. There has been interval re- expansion and aeration to the entire left lower lobe. The left upper lobe remains completely collapsed. 3. Left pleural effusion is decreased in volume from previous exam. 4. Hiatal hernia 5. Aortic Atherosclerosis (ICD10-I70.0) and Emphysema (ICD10-J43.9). Three vessel coronary artery calcifications noted. 6. Osteopenia and stable T10 compression fracture. Electronically Signed   By: TKerby MoorsM.D.   On: 01/15/2017 18:06   Nm Pet Image Initial (pi) Skull Base To  Thigh  Result Date: 01/28/2017 CLINICAL DATA:  Initial treatment strategy for left lung cancer. EXAM: NUCLEAR MEDICINE PET SKULL BASE TO THIGH TECHNIQUE: 7.0 mCi F-18 FDG was injected intravenously. Full-ring PET imaging was performed from the skull base to thigh after the radiotracer. CT data was obtained and used for attenuation correction and anatomic localization. FASTING BLOOD GLUCOSE:  Value: 104 mg/dl COMPARISON:  Multiple exams, including CT exams from 01/15/2017 and 12/03/2016 FINDINGS: NECK No hypermetabolic lymph nodes in the neck. Bilateral mild atherosclerotic calcification of the common carotid arteries. Intracranial chronic ischemic microvascular white matter disease. CHEST Left upper lobe suprahilar mass confluent with suspected suprahilar adenopathy in contributing to complete obstruction of the left  upper lobe and left lower lobe tracheobronchial tree. The hypermetabolic activity associated with the mass new measures approximately 4.3 by 2.3 by 3.8 cm and has a maximum standard uptake value of 20.4. There is associated complete atelectasis of the left upper lobe, with expansion and air trapping in the left lower lobe. I am skeptical that there is any significant gas exchange in the left lung given the occlusion of both the upper lobe and lower lobe bronchi. A small left pleural effusion is faintly hypermetabolic, maximum SUV approximately 2.8. Moderate-sized hiatal hernia. Coronary, aortic arch, and branch vessel atherosclerotic vascular disease. Small upper pericardial effusion. Calcified aortic valve. Notable centrilobular emphysema.  Scarring in the right middle lobe. ABDOMEN/PELVIS Mild left adrenal thickening is essentially stable from 02/25/2016, it demonstrates mild asymmetric metabolic activity with maximum SUV 4.4. Scattered multifocal accentuated activity in the bowel is likely physiologic given the lack of a CT correlate. However, there is some right eccentric focal activity at the anus, maximum SUV 23.5, potentially with mild soft tissue prominence in this vicinity, which could represent a small anal mass. Digital rectal exam correlation recommended with particular assessment of the right anterior anal canal. Pneumobilia. Aortoiliac atherosclerotic vascular disease. 6.8 by 5.8 cm right ovarian cystic lesion without hypermetabolic activity within this lesion or along its margins. This lesion has simple fluid density characteristics. SKELETON Multiple healed bilateral rib fractures. Left sixth rib sclerosis and cortical thickening without a significant degree of metabolic activity, most likely due to an old fracture. Bony demineralization. Two screws are present in the right hip. Possible mild avascular necrosis of the right femoral head. Benign appearing compression fractures at T10, L1, L4, and L5.  Thoracic kyphosis. IMPRESSION: 1. Complete obstruction of the left upper lobe bronchus and left lower lobe bronchus due to a 4.3 by 2.3 by 3.8 cm central obstructing mass with maximum SUV 20.4. This may represent a confluence of the original nodule and some underlying left hilar adenopathy. There is resulting atelectasis of the left upper lobe and likely air trapping in the left lower lobe. 2. Faintly hypermetabolic small left pleural effusion is concerning for possible malignant effusion. Benign exudative effusion less likely. 3. Mild left adrenal thickening is stable from 02/25/2016, but with mildly asymmetric accentuated metabolic activity with maximum SUV 4.4, merit surveillance. 4. There is a focus of hypermetabolic activity in the right anterior anal canal, maximum SUV 23.5, could represent a small polyp or malignancy, correlate with digital rectal exam. 5. 6.8 by 5.8 cm right ovarian cystic lesion has simple density characteristics and no associated abnormal metabolic activity. 6. Multifocal accentuated activity in the bowel without CT correlate, favoring physiologic activity. 7. Other imaging findings of potential clinical significance: Chronic ischemic microvascular white matter disease of the brain. Moderate-sized hiatal hernia. Aortic Atherosclerosis (ICD10-I70.0). Coronary atherosclerosis.  Calcified aortic valve. Emphysema (ICD10-J43.9). Pneumobilia. Multiple bilateral healed rib fractures and compression fractures at T10, L1, L4, and L5. 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 Electronically Signed   By: Van Clines M.D.   On: 01/28/2017 12:02    Impression/Plan: 1. 83 y.o. female with at least cT3N0 NSCLC, squamous cell carcinoma. She appears to have tolerated her recent palliative radiotherapy well and has not had any residual ill effects.  However, on recent PET scan, there appears to be significant residual tumor causing complete obstruction of the left upper  lobe bronchus and left lower lobe bronchus.  She will meet with Dr. Julien Nordmann on Monday 02/04/17 to discuss further recommendations for treatment.  We are happy to continue to participate in her care if clinically indicated and will await recommendations from Dr. Julien Nordmann.  She knows to call with any questions or concerns related to her previous radiotherapy.    Nicholos Johns, PA-C

## 2017-01-31 NOTE — Addendum Note (Signed)
Encounter addended by: Malena Edman, RN on: 01/31/2017  8:42 AM<BR>    Actions taken: Charge Capture section accepted

## 2017-02-04 ENCOUNTER — Encounter: Payer: Self-pay | Admitting: Internal Medicine

## 2017-02-04 ENCOUNTER — Other Ambulatory Visit (HOSPITAL_BASED_OUTPATIENT_CLINIC_OR_DEPARTMENT_OTHER): Payer: Medicare Other

## 2017-02-04 ENCOUNTER — Ambulatory Visit (HOSPITAL_BASED_OUTPATIENT_CLINIC_OR_DEPARTMENT_OTHER): Payer: Medicare Other | Admitting: Internal Medicine

## 2017-02-04 ENCOUNTER — Telehealth: Payer: Self-pay | Admitting: Internal Medicine

## 2017-02-04 VITALS — BP 147/76 | HR 77 | Temp 98.7°F | Resp 16 | Ht 66.0 in | Wt 134.9 lb

## 2017-02-04 DIAGNOSIS — C3401 Malignant neoplasm of right main bronchus: Secondary | ICD-10-CM

## 2017-02-04 DIAGNOSIS — J90 Pleural effusion, not elsewhere classified: Secondary | ICD-10-CM | POA: Diagnosis not present

## 2017-02-04 DIAGNOSIS — C3492 Malignant neoplasm of unspecified part of left bronchus or lung: Secondary | ICD-10-CM

## 2017-02-04 LAB — CBC WITH DIFFERENTIAL/PLATELET
BASO%: 1.1 % (ref 0.0–2.0)
Basophils Absolute: 0.1 10*3/uL (ref 0.0–0.1)
EOS ABS: 0.4 10*3/uL (ref 0.0–0.5)
EOS%: 8 % — AB (ref 0.0–7.0)
HCT: 33 % — ABNORMAL LOW (ref 34.8–46.6)
HEMOGLOBIN: 10.3 g/dL — AB (ref 11.6–15.9)
LYMPH%: 8 % — AB (ref 14.0–49.7)
MCH: 25.3 pg (ref 25.1–34.0)
MCHC: 31.1 g/dL — ABNORMAL LOW (ref 31.5–36.0)
MCV: 81.4 fL (ref 79.5–101.0)
MONO#: 0.5 10*3/uL (ref 0.1–0.9)
MONO%: 9.9 % (ref 0.0–14.0)
NEUT%: 73 % (ref 38.4–76.8)
NEUTROS ABS: 3.9 10*3/uL (ref 1.5–6.5)
Platelets: 283 10*3/uL (ref 145–400)
RBC: 4.05 10*6/uL (ref 3.70–5.45)
RDW: 18 % — AB (ref 11.2–14.5)
WBC: 5.4 10*3/uL (ref 3.9–10.3)
lymph#: 0.4 10*3/uL — ABNORMAL LOW (ref 0.9–3.3)

## 2017-02-04 LAB — COMPREHENSIVE METABOLIC PANEL
ALBUMIN: 3.1 g/dL — AB (ref 3.5–5.0)
ALK PHOS: 69 U/L (ref 40–150)
ALT: 7 U/L (ref 0–55)
ANION GAP: 8 meq/L (ref 3–11)
AST: 16 U/L (ref 5–34)
BILIRUBIN TOTAL: 0.24 mg/dL (ref 0.20–1.20)
BUN: 15.5 mg/dL (ref 7.0–26.0)
CALCIUM: 10.8 mg/dL — AB (ref 8.4–10.4)
CHLORIDE: 93 meq/L — AB (ref 98–109)
CO2: 40 mEq/L — ABNORMAL HIGH (ref 22–29)
CREATININE: 0.9 mg/dL (ref 0.6–1.1)
EGFR: 60 mL/min/{1.73_m2} — ABNORMAL LOW (ref >60)
Glucose: 88 mg/dl (ref 70–140)
Potassium: 4.4 mEq/L (ref 3.5–5.1)
Sodium: 142 mEq/L (ref 136–145)
TOTAL PROTEIN: 7.9 g/dL (ref 6.4–8.3)

## 2017-02-04 MED ORDER — PROCHLORPERAZINE MALEATE 10 MG PO TABS: 10.0000 mg | ORAL_TABLET | Freq: Four times a day (QID) | ORAL | 0 refills | Status: AC | PRN

## 2017-02-04 NOTE — Progress Notes (Signed)
START ON PATHWAY REGIMEN - Non-Small Cell Lung     A cycle is every 21 days:     Paclitaxel      Carboplatin   **Always confirm dose/schedule in your pharmacy ordering system**    Patient Characteristics: Stage IV Metastatic, Squamous, PS = 0, 1, First Line, PD-L1 Expression Positive 1-49% (TPS) / Negative / Not Tested / Not a Candidate for Immunotherapy/Awaiting Test Results AJCC T Category: T4 Current Disease Status: Distant Metastases AJCC N Category: N1 AJCC M Category: M1a AJCC 8 Stage Grouping: IVA Histology: Squamous Cell Line of therapy: First Line PD-L1 Expression Status: Quantity Not Sufficient Performance Status: PS = 0, 1 Would you be surprised if this patient died  in the next year<= I would NOT be surprised if this patient died in the next year Intent of Therapy: Non-Curative / Palliative Intent, Discussed with Patient 

## 2017-02-04 NOTE — Telephone Encounter (Signed)
Scheduled appt per 1/15 los - Gave patient AVS and calender per los.  

## 2017-02-04 NOTE — Progress Notes (Signed)
[facility] [phone]   [phone]  CONSULT NOTE  REFERRING PHYSICIAN: Dr. Tyler Pita  REASON FOR CONSULTATION:  83 years old white female recently diagnosed with lung cancer.  HPI Paula Contreras is a 83 y.o. female with past medical history significant for anemia, aortic stenosis, bradycardia, COPD, chronic kidney disease, hypertension, osteoporosis, atrial fibrillation, cataract surgery as well as long history for smoking but quit several years ago.  The patient has chronic shortness of breath and has been on home oxygen for more than 5 years.  In August 2018 she has been complaining of increasing shortness of breath as well as cough with hemoptysis.  She had chest x-ray performed on November 29, 2016 and that showed severe leftward mediastinal shift with deviation of the carina to the midline of the left hemothorax.  There was also a large left pleural effusion CT scan of the chest was performed with.  On November 29, 2016 and that showed subtotal atelectasis of the left lung with extensive fluid/mucus throughout the left mainstem bronchus, left lower lobe and extending into the subsegmental bronchi in both the left upper and lower lobes.  The patient underwent bronchoscopy under the care of Dr. Lamonte Sakai on 12/04/2016 and the final pathology ([id number]) was consistent with a squamous cell carcinoma. CT scan of the head as well as CT scan of the abdomen on 12/03/2016 showed no evidence of metastatic disease to the brain or abdomen. The patient by Dr. Tammi Klippel and she underwent palliative radiotherapy to the left mainstem bronchus for a total dose of 20 GY in 5 fractions completed on 12/14/2016. Repeat CT scan of the chest on 12/16/2016 showed interval response to therapy.  The left perihilar lung mass increased in size interval.  Interval reexpansion and irrigation to the entire left lower lobe.  The left upper lobe remains completely collapsed.  The left pleural  effusion is decreased in volume from the previous exam. A PET scan on 01/28/2017 showed complete obstruction of the left upper lobe bronchus and left lower lobe bronchus due to 4.3 x 2.3 x 3.8 cm central obstructing mass with maximum SUV of 20.4.  This may represent a confluence of the original nodule and some underlying left hilar adenopathy.  There is resulting atelectasis of the left upper lobe and likely air trapping in the left lower lobe.  There was likely hypermetabolic a small left pleural effusion concerning for possible malignant effusion.  The scan also showed a focus of hypermetabolic activity in the right anterior anal canal with maximum SUV of 23.5 suspicious for a small polyp or malignancy.  There was also 6.8 x 5.8 cm right ovarian cystic lesion that has simple density characteristic and no association abnormal metabolic activity. Dr. Tammi Klippel kindly referred the patient to me today for evaluation and recommendation regarding treatment of her condition. The patient has hearing deficit and she was accompanied by the driver from the facility. She continues to complain of shortness of breath at baseline increased with exertion and she is currently on home oxygen.  She has cough of white sputum but no significant chest pain or hemoptysis.  She lost a few pounds in the last few weeks.  She denied having any nausea, vomiting, diarrhea or constipation.  She denied having any fever or chills. Family history significant for father died from lung cancer and mother had kidney disease. The patient is a widow and has no children.  She lives in a skilled nursing facility.  She  has a history for smoking for many years and quit several years ago.  She has no history of alcohol or drug abuse.    HPI  Past Medical History:  Diagnosis Date  . Anemia 02/20/2015  . Aortic stenosis    a. 2D Echo 07/2013: EF 60-65%, no RWMA, grade 1 DD, normal LV filling pressure, moderate AS, mild TR, PASP 7mmHg.  .  Bradycardia    a. possible bradycardia with HR 44 by physical exam notes at nephrologist office in 03/2014. F/u event monitor showed NSR PACs average HR 81, bradycardia <1% of readable data, no pauses of 3 seconds or longer..  . Cataract   . Chronic respiratory failure ([place])    a. On home O2 since 2012.  . CKD (chronic kidney disease), stage III ([place])   . COPD (chronic obstructive pulmonary disease) ([place])   . Dementia   . Hypertension   . Osteoporosis   . Other psoriasis and similar disorders    psoriasis vs eczema  . Paroxysmal atrial fibrillation ([place])    a. Dx 07/2013. No attempts per Epic to restore NSR but in NSR 2016.  Marland Kitchen Pneumonia 03/2016  . Skin cancer     Past Surgical History:  Procedure Laterality Date  . BREAST SURGERY Right 1964   tumor removed  . CATARACT EXTRACTION Bilateral   . HIP SURGERY Right    fracture    Family History  Problem Relation Age of Onset  . Asthma Mother   . Kidney disease Mother   . Lung cancer Father   . Asthma Maternal Grandmother   . Lung cancer Paternal Uncle        father's twin brother  . Diabetes Brother     Social History Social History   Tobacco Use  . Smoking status: Former Smoker    Packs/day: 1.00    Years: 55.00    Pack years: 55.00    Types: Cigarettes    Last attempt to quit: 09/25/2010    Years since quitting: 6.3  . Smokeless tobacco: Never Used  Substance Use Topics  . Alcohol use: No  . Drug use: No    Allergies  Allergen Reactions  . Codeine Nausea And Vomiting  . Robitussin (Alcohol Free) [Guaifenesin] Other (See Comments)    Per MAR    Current Outpatient Medications  Medication Sig Dispense Refill  . acetaminophen (TYLENOL) 325 MG tablet Take 650 mg by mouth every 6 (six) hours as needed (for pain).     Marland Kitchen albuterol (PROAIR HFA) 108 (90 BASE) MCG/ACT inhaler Inhale 2 puffs into the lungs every 4 (four) hours as needed for wheezing or shortness of breath. 1 Inhaler 3  . alendronate (FOSAMAX) 70 MG  tablet Take 70 mg by mouth every Monday. Take with a full glass of water on an empty stomach.    . benzonatate (TESSALON) 100 MG capsule Take 1 capsule (100 mg total) by mouth 3 (three) times daily as needed for cough. 20 capsule 0  . calcium carbonate (OS-CAL) 600 MG TABS tablet Take 2 tablets by mouth daily with breakfast.     . Cholecalciferol (VITAMIN D) 2000 UNITS CAPS Take 2,000 Units by mouth daily with breakfast.     . esomeprazole (NEXIUM) 20 MG capsule Take 20 mg by mouth daily at 12 noon.    . feeding supplement (BOOST / RESOURCE BREEZE) LIQD Take 1 Container by mouth every evening.     . fluticasone (FLONASE) 50 MCG/ACT nasal spray Place 1 spray  into both nostrils daily.  2  . levalbuterol (XOPENEX) 0.63 MG/3ML nebulizer solution Take 3 mLs (0.63 mg total) by nebulization every 6 (six) hours as needed for wheezing or shortness of breath.    . Multiple Vitamins-Iron (MULTIVITAMIN/IRON PO) Take 1 tablet by mouth daily.    . OXYGEN Inhale 2-4 L into the lungs continuous. To keep O2 stats >90%    . senna-docusate (SENOKOT-S) 8.6-50 MG tablet Take 1 tablet by mouth at bedtime as needed for mild constipation. (Patient not taking: Reported on 01/30/2017)    . Tiotropium Bromide Monohydrate (SPIRIVA RESPIMAT) 2.5 MCG/ACT AERS Inhale 2 puffs into the lungs daily.     No current facility-administered medications for this visit.     Review of Systems  Constitutional: positive for fatigue and weight loss Eyes: negative Ears, nose, mouth, throat, and face: negative Respiratory: positive for cough, dyspnea on exertion and sputum Cardiovascular: negative Gastrointestinal: negative Genitourinary:negative Integument/breast: negative Hematologic/lymphatic: negative Musculoskeletal:positive for muscle weakness Neurological: negative Behavioral/Psych: negative Endocrine: negative Allergic/Immunologic: negative  Physical Exam  RCV:ELFYB, healthy, no distress, well nourished and well  developed SKIN: skin color, texture, turgor are normal, no rashes or significant lesions HEAD: Normocephalic, No masses, lesions, tenderness or abnormalities EYES: normal, PERRLA, Conjunctiva are pink and non-injected EARS: External ears normal, Canals clear OROPHARYNX:no exudate, no erythema and lips, buccal mucosa, and tongue normal  NECK: supple, no adenopathy, no JVD LYMPH:  no palpable lymphadenopathy, no hepatosplenomegaly BREAST:not examined LUNGS: decreased breath sounds, expiratory wheezes bilaterally HEART: regular rate & rhythm and no murmurs ABDOMEN:abdomen soft, non-tender, normal bowel sounds and no masses or organomegaly BACK: Back symmetric, no curvature., No CVA tenderness EXTREMITIES:no joint deformities, effusion, or inflammation, no edema  NEURO: alert & oriented x 3 with fluent speech, no focal motor/sensory deficits  PERFORMANCE STATUS: ECOG 1-2  LABORATORY DATA: Lab Results  Component Value Date   WBC 5.4 02/04/2017   HGB 10.3 (L) 02/04/2017   HCT 33.0 (L) 02/04/2017   MCV 81.4 02/04/2017   PLT 283 02/04/2017      Chemistry      Component Value Date/Time   NA 142 02/04/2017 1100   K 4.4 02/04/2017 1100   CL 102 12/03/2016 0913   CO2 40 (H) 02/04/2017 1100   BUN 15.5 02/04/2017 1100   CREATININE 0.9 02/04/2017 1100      Component Value Date/Time   CALCIUM 10.8 (H) 02/04/2017 1100   ALKPHOS 69 02/04/2017 1100   AST 16 02/04/2017 1100   ALT 7 02/04/2017 1100   BILITOT 0.24 02/04/2017 1100       RADIOGRAPHIC STUDIES: Ct Chest W Contrast  Result Date: 01/15/2017 CLINICAL DATA:  Followup lung cancer. EXAM: CT CHEST WITH CONTRAST TECHNIQUE: Multidetector CT imaging of the chest was performed during intravenous contrast administration. CONTRAST:  12mL ISOVUE-300 IOPAMIDOL (ISOVUE-300) INJECTION 61% COMPARISON:  11/29/2016 FINDINGS: Cardiovascular: Normal heart size. No pericardial effusion. Aortic atherosclerosis. Calcification in the LAD, RCA and  left circumflex coronary artery is noted. Mediastinum/Nodes: Moderate size hiatal hernia. Trachea appears patent. No mediastinal or right hilar adenopathy identified. Lungs/Pleura: Moderate to advanced changes of emphysema. The right lung is clear. Decrease in volume of the left pleural effusion. Interval re-expansion and aeration of the entire left lower lobe. Continued complete atelectasis of the left upper lobe is identified. Margins of the left perihilar lung mass are difficult to identified. Taken from the coronal series the left hilar mass is felt to measure 4.5 x 3.5 cm, image 73 of series 6.  This is compared with 4.9 x 5.7 cm previously. Upper Abdomen: Pneumobilia identified.  No acute abnormality noted. Musculoskeletal: The bones appear diffusely osteopenic. Similar appearance of T10 compression fracture, image 74 of series 7. No aggressive lytic or sclerotic bone lesions identified. IMPRESSION: 1. Interval response to therapy. 2. The left perihilar lung mass has decreased in size in the interval. There has been interval re- expansion and aeration to the entire left lower lobe. The left upper lobe remains completely collapsed. 3. Left pleural effusion is decreased in volume from previous exam. 4. Hiatal hernia 5. Aortic Atherosclerosis (ICD10-I70.0) and Emphysema (ICD10-J43.9). Three vessel coronary artery calcifications noted. 6. Osteopenia and stable T10 compression fracture. Electronically Signed   By: Kerby Moors M.D.   On: 01/15/2017 18:06   Nm Pet Image Initial (pi) Skull Base To Thigh  Result Date: 01/28/2017 CLINICAL DATA:  Initial treatment strategy for left lung cancer. EXAM: NUCLEAR MEDICINE PET SKULL BASE TO THIGH TECHNIQUE: 7.0 mCi F-18 FDG was injected intravenously. Full-ring PET imaging was performed from the skull base to thigh after the radiotracer. CT data was obtained and used for attenuation correction and anatomic localization. FASTING BLOOD GLUCOSE:  Value: 104 mg/dl  COMPARISON:  Multiple exams, including CT exams from 01/15/2017 and 12/03/2016 FINDINGS: NECK No hypermetabolic lymph nodes in the neck. Bilateral mild atherosclerotic calcification of the common carotid arteries. Intracranial chronic ischemic microvascular white matter disease. CHEST Left upper lobe suprahilar mass confluent with suspected suprahilar adenopathy in contributing to complete obstruction of the left upper lobe and left lower lobe tracheobronchial tree. The hypermetabolic activity associated with the mass new measures approximately 4.3 by 2.3 by 3.8 cm and has a maximum standard uptake value of 20.4. There is associated complete atelectasis of the left upper lobe, with expansion and air trapping in the left lower lobe. I am skeptical that there is any significant gas exchange in the left lung given the occlusion of both the upper lobe and lower lobe bronchi. A small left pleural effusion is faintly hypermetabolic, maximum SUV approximately 2.8. Moderate-sized hiatal hernia. Coronary, aortic arch, and branch vessel atherosclerotic vascular disease. Small upper pericardial effusion. Calcified aortic valve. Notable centrilobular emphysema.  Scarring in the right middle lobe. ABDOMEN/PELVIS Mild left adrenal thickening is essentially stable from 02/25/2016, it demonstrates mild asymmetric metabolic activity with maximum SUV 4.4. Scattered multifocal accentuated activity in the bowel is likely physiologic given the lack of a CT correlate. However, there is some right eccentric focal activity at the anus, maximum SUV 23.5, potentially with mild soft tissue prominence in this vicinity, which could represent a small anal mass. Digital rectal exam correlation recommended with particular assessment of the right anterior anal canal. Pneumobilia. Aortoiliac atherosclerotic vascular disease. 6.8 by 5.8 cm right ovarian cystic lesion without hypermetabolic activity within this lesion or along its margins. This lesion  has simple fluid density characteristics. SKELETON Multiple healed bilateral rib fractures. Left sixth rib sclerosis and cortical thickening without a significant degree of metabolic activity, most likely due to an old fracture. Bony demineralization. Two screws are present in the right hip. Possible mild avascular necrosis of the right femoral head. Benign appearing compression fractures at T10, L1, L4, and L5. Thoracic kyphosis. IMPRESSION: 1. Complete obstruction of the left upper lobe bronchus and left lower lobe bronchus due to a 4.3 by 2.3 by 3.8 cm central obstructing mass with maximum SUV 20.4. This may represent a confluence of the original nodule and some underlying left hilar adenopathy. There is resulting  atelectasis of the left upper lobe and likely air trapping in the left lower lobe. 2. Faintly hypermetabolic small left pleural effusion is concerning for possible malignant effusion. Benign exudative effusion less likely. 3. Mild left adrenal thickening is stable from 02/25/2016, but with mildly asymmetric accentuated metabolic activity with maximum SUV 4.4, merit surveillance. 4. There is a focus of hypermetabolic activity in the right anterior anal canal, maximum SUV 23.5, could represent a small polyp or malignancy, correlate with digital rectal exam. 5. 6.8 by 5.8 cm right ovarian cystic lesion has simple density characteristics and no associated abnormal metabolic activity. 6. Multifocal accentuated activity in the bowel without CT correlate, favoring physiologic activity. 7. Other imaging findings of potential clinical significance: Chronic ischemic microvascular white matter disease of the brain. Moderate-sized hiatal hernia. Aortic Atherosclerosis (ICD10-I70.0). Coronary atherosclerosis. Calcified aortic valve. Emphysema (ICD10-J43.9). Pneumobilia. Multiple bilateral healed rib fractures and compression fractures at T10, L1, L4, and L5. Suspected old mild avascular necrosis of the right femoral  head. Electronically Signed   By: Van Clines M.D.   On: 01/28/2017 12:02    ASSESSMENT: This is a very pleasant 83 years old white female with highly suspicious stage IV open (T3, N1, M1a) metastatic non-small cell lung cancer, squamous cell carcinoma presented with large central obstructive left hilar mass with hilar level at The patient left pleural effusion diagnosed in September 2018. The patient is a status post a short course of palliative radiotherapy to the central obstructing mass.   PLAN: I had a discussion with the patient and her caregiver today about her current disease stage, prognosis and treatment options. I explained to the patient that she has incurable condition and all the treatment will be of palliative nature. I gave the patient the option of palliative care and hospice referral versus consent of palliative chemotherapy carboplatin AC of 5 and paclitaxel 175 mg/M2 every 3 weeks with Neulasta support. The patient is interested in proceeding with systemic chemotherapy. I discussed with her the adverse effect of this treatment including but not limited to alopecia, myelosuppression, nausea and vomiting, peripheral neuropathy, liver or renal dysfunction. We will arrange for the patient to have a chemotherapy education class before the first dose of her treatment. I would call her pharmacy with prescription for Compazine 10 mg p.o. every 6 hours as needed for nausea. The patient is expected to start the first cycle of this treatment next week. She will come back for weeks for evaluation and management any Advil of her treatment. Patient was advised to call immediately if she has any current symptoms in the interval. The patient voices understanding of current disease status and treatment options and is in agreement with the current care plan.  All questions were answered. The patient knows to call the clinic with any problems, questions or concerns. We can certainly see  the patient much sooner if necessary.  Thank you so much for allowing me to participate in the care of Paula Contreras. I will continue to follow up the patient with you and assist in her care.  I spent 55 minutes counseling the patient face to face. The total time spent in the appointment was 80 minutes.  Disclaimer: This note was dictated with voice recognition software. Similar sounding words can inadvertently be transcribed and may not be corrected upon review.   Eilleen Kempf February 04, 2017, 12:25 PM

## 2017-02-06 ENCOUNTER — Encounter: Payer: Self-pay | Admitting: *Deleted

## 2017-02-06 ENCOUNTER — Telehealth: Payer: Self-pay | Admitting: Medical Oncology

## 2017-02-06 ENCOUNTER — Other Ambulatory Visit: Payer: Medicare Other

## 2017-02-06 NOTE — Telephone Encounter (Signed)
Pt requests weekly labs be drawn at [facility]. I called [facility] , Joy. She  is trying to schedule a phlebotomist to come in weekly and draw labs. She will know more next week and call back to see if we can arrange weekly labs to be done at [facility] starting 11/21

## 2017-02-07 ENCOUNTER — Encounter: Payer: Self-pay | Admitting: *Deleted

## 2017-02-13 ENCOUNTER — Encounter: Payer: Self-pay | Admitting: Oncology

## 2017-02-13 ENCOUNTER — Ambulatory Visit (HOSPITAL_BASED_OUTPATIENT_CLINIC_OR_DEPARTMENT_OTHER): Payer: Medicare Other | Admitting: Medical

## 2017-02-13 ENCOUNTER — Ambulatory Visit (HOSPITAL_BASED_OUTPATIENT_CLINIC_OR_DEPARTMENT_OTHER): Payer: Medicare Other

## 2017-02-13 ENCOUNTER — Ambulatory Visit (HOSPITAL_BASED_OUTPATIENT_CLINIC_OR_DEPARTMENT_OTHER): Payer: Medicare Other | Admitting: Oncology

## 2017-02-13 ENCOUNTER — Other Ambulatory Visit: Payer: Medicare Other

## 2017-02-13 VITALS — BP 137/75 | HR 76 | Temp 98.5°F | Resp 23

## 2017-02-13 DIAGNOSIS — Z5111 Encounter for antineoplastic chemotherapy: Secondary | ICD-10-CM

## 2017-02-13 DIAGNOSIS — C3402 Malignant neoplasm of left main bronchus: Secondary | ICD-10-CM

## 2017-02-13 DIAGNOSIS — C3492 Malignant neoplasm of unspecified part of left bronchus or lung: Secondary | ICD-10-CM

## 2017-02-13 DIAGNOSIS — C3412 Malignant neoplasm of upper lobe, left bronchus or lung: Secondary | ICD-10-CM

## 2017-02-13 DIAGNOSIS — Z5189 Encounter for other specified aftercare: Secondary | ICD-10-CM | POA: Diagnosis not present

## 2017-02-13 DIAGNOSIS — T8090XA Unspecified complication following infusion and therapeutic injection, initial encounter: Secondary | ICD-10-CM

## 2017-02-13 MED ORDER — METHYLPREDNISOLONE SODIUM SUCC 125 MG IJ SOLR
125.0000 mg | Freq: Once | INTRAMUSCULAR | Status: AC | PRN
  Administered 2017-02-13: 125 mg via INTRAVENOUS

## 2017-02-13 MED ORDER — FAMOTIDINE IN NACL 20-0.9 MG/50ML-% IV SOLN
20.0000 mg | Freq: Once | INTRAVENOUS | Status: AC
  Administered 2017-02-13: 20 mg via INTRAVENOUS

## 2017-02-13 MED ORDER — SODIUM CHLORIDE 0.9 % IV SOLN
175.0000 mg/m2 | Freq: Once | INTRAVENOUS | Status: AC
  Administered 2017-02-13: 294 mg via INTRAVENOUS
  Filled 2017-02-13: qty 49

## 2017-02-13 MED ORDER — FAMOTIDINE IN NACL 20-0.9 MG/50ML-% IV SOLN
20.0000 mg | Freq: Once | INTRAVENOUS | Status: AC | PRN
  Administered 2017-02-13: 20 mg via INTRAVENOUS

## 2017-02-13 MED ORDER — FAMOTIDINE IN NACL 20-0.9 MG/50ML-% IV SOLN
INTRAVENOUS | Status: AC
  Filled 2017-02-13: qty 50

## 2017-02-13 MED ORDER — DIPHENHYDRAMINE HCL 50 MG/ML IJ SOLN
50.0000 mg | Freq: Once | INTRAMUSCULAR | Status: AC
  Administered 2017-02-13: 50 mg via INTRAVENOUS

## 2017-02-13 MED ORDER — SODIUM CHLORIDE 0.9 % IV SOLN
Freq: Once | INTRAVENOUS | Status: AC
  Administered 2017-02-13: 13:00:00 via INTRAVENOUS

## 2017-02-13 MED ORDER — DEXAMETHASONE SODIUM PHOSPHATE 10 MG/ML IJ SOLN
INTRAMUSCULAR | Status: AC
  Filled 2017-02-13: qty 1

## 2017-02-13 MED ORDER — PALONOSETRON HCL INJECTION 0.25 MG/5ML
INTRAVENOUS | Status: AC
  Filled 2017-02-13: qty 5

## 2017-02-13 MED ORDER — DIPHENHYDRAMINE HCL 50 MG/ML IJ SOLN
INTRAMUSCULAR | Status: AC
  Filled 2017-02-13: qty 1

## 2017-02-13 MED ORDER — CARBOPLATIN CHEMO INJECTION 450 MG/45ML
338.0000 mg | Freq: Once | INTRAVENOUS | Status: AC
  Administered 2017-02-13: 340 mg via INTRAVENOUS
  Filled 2017-02-13: qty 34

## 2017-02-13 MED ORDER — ALBUTEROL SULFATE (2.5 MG/3ML) 0.083% IN NEBU
2.5000 mg | INHALATION_SOLUTION | Freq: Once | RESPIRATORY_TRACT | Status: AC | PRN
  Administered 2017-02-13: 2.5 mg via RESPIRATORY_TRACT
  Filled 2017-02-13: qty 3

## 2017-02-13 MED ORDER — PALONOSETRON HCL INJECTION 0.25 MG/5ML
0.2500 mg | Freq: Once | INTRAVENOUS | Status: AC
  Administered 2017-02-13: 0.25 mg via INTRAVENOUS

## 2017-02-13 MED ORDER — SODIUM CHLORIDE 0.9 % IV SOLN
20.0000 mg | Freq: Once | INTRAVENOUS | Status: AC
  Administered 2017-02-13: 20 mg via INTRAVENOUS
  Filled 2017-02-13: qty 2

## 2017-02-13 MED ORDER — PEGFILGRASTIM 6 MG/0.6ML ~~LOC~~ PSKT
6.0000 mg | PREFILLED_SYRINGE | Freq: Once | SUBCUTANEOUS | Status: AC
  Administered 2017-02-13: 6 mg via SUBCUTANEOUS
  Filled 2017-02-13: qty 0.6

## 2017-02-13 NOTE — Progress Notes (Signed)
NO labs today (since patient was late to building due facility transport issues) per MD Mohammed. Will use baseline labs from 9 days ago.

## 2017-02-13 NOTE — Progress Notes (Signed)
1423 pt VS being taken, Pt began coughing, states " I cant Breath" PAO2 68% w/ Pt on 3LNC O2, BP 101/53. 1426 Solumedrol 125mg  IVP 1428 Pepcid IV open to gravity, [place], [place], pt  1433 VO from , [place] for Breathing treatment, pt verbalized she is " better" when asked how her breathing is. 1500 MD at chairside evaluated pt, VO to restart. If pt has reaction to discontinue treatment. 1505 Treatment restarted.

## 2017-02-13 NOTE — Progress Notes (Signed)
[place] OFFICE PROGRESS NOTE  System, Pcp Not In No address on file  DIAGNOSIS: Highly suspicious stage IV open (T3, N1, M1a) metastatic non-small cell lung cancer, squamous cell carcinoma presented with large central obstructive left hilar mass with hilar level at The patient left pleural effusion diagnosed in September 2018. The patient is a status post a short course of palliative radiotherapy to the central obstructing mass.  PRIOR THERAPY: Palliative radiotherapy to the left mainstem bronchus for a total dose of 20 GY in 5 fractions completed on 12/14/2016.  CURRENT THERAPY: Palliative systemic chemotherapy with carboplatin for an AUC of 5 and paclitaxel 175 mg/m every 3 weeks with Neulasta support.  First dose 02/13/2017.  INTERVAL HISTORY: Paula Contreras 83 y.o. female returns for routine follow-up visit by herself.  The patient was seen in the infusion room.  Patient has no specific complaints.  Denies fevers and chills.  Denies chest pain, cough, hemoptysis.  She has her baseline shortness of breath.  Denies nausea, vomiting, constipation, diarrhea.  The patient is here for evaluation prior to cycle 1 of her chemotherapy.  MEDICAL HISTORY: Past Medical History:  Diagnosis Date  . Anemia 02/20/2015  . Aortic stenosis    a. 2D Echo 07/2013: EF 60-65%, no RWMA, grade 1 DD, normal LV filling pressure, moderate AS, mild TR, PASP 33mHg.  . Bradycardia    a. possible bradycardia with HR 44 by physical exam notes at nephrologist office in 03/2014. F/u event monitor showed NSR PACs average HR 81, bradycardia <1% of readable data, no pauses of 3 seconds or longer..  . Cataract   . Chronic respiratory failure ([place]    a. On home O2 since 2012.  . CKD (chronic kidney disease), stage III ([place]   . COPD [place] obstructive pulmonary disease) ([place]   . Dementia   . Hypertension   . Osteoporosis   . Other psoriasis and similar disorders    psoriasis vs eczema  .  Paroxysmal atrial fibrillation ([place]    a. Dx 07/2013. No attempts per Epic to restore NSR but in NSR 2016.  .Marland KitchenPneumonia 03/2016  . Skin cancer     ALLERGIES:  is allergic to codeine and robitussin (alcohol free) [guaifenesin].  MEDICATIONS:  Current Outpatient Medications  Medication Sig Dispense Refill  . acetaminophen (TYLENOL) 325 MG tablet Take 650 mg by mouth every 6 (six) hours as needed (for pain).     .Marland Kitchenalbuterol (PROAIR HFA) 108 (90 BASE) MCG/ACT inhaler Inhale 2 puffs into the lungs every 4 (four) hours as needed for wheezing or shortness of breath. 1 Inhaler 3  . alendronate (FOSAMAX) 70 MG tablet Take 70 mg by mouth every Monday. Take with a full glass of water on an empty stomach.    . benzonatate (TESSALON) 100 MG capsule Take 1 capsule (100 mg total) by mouth 3 (three) times daily as needed for cough. 20 capsule 0  . calcium carbonate (OS-CAL) 600 MG TABS tablet Take 2 tablets by mouth daily with breakfast.     . carvedilol (COREG) 3.125 MG tablet     . Cholecalciferol (VITAMIN D) 2000 UNITS CAPS Take 2,000 Units by mouth daily with breakfast.     . esomeprazole (NEXIUM) 20 MG capsule Take 20 mg by mouth daily at 12 noon.    . feeding supplement (BOOST / RESOURCE BREEZE) LIQD Take 1 Container by mouth every evening.     . fluticasone (FLONASE) 50 MCG/ACT nasal spray Place 1 spray into  both nostrils daily.  2  . hydrocortisone 2.5 % cream     . ipratropium-albuterol (DUONEB) 0.5-2.5 (3) MG/3ML SOLN     . levalbuterol (XOPENEX) 0.63 MG/3ML nebulizer solution Take 3 mLs (0.63 mg total) by nebulization every 6 (six) hours as needed for wheezing or shortness of breath.    . Multiple Vitamins-Iron (MULTIVITAMIN/IRON PO) Take 1 tablet by mouth daily.    . OXYGEN Inhale 2-4 L into the lungs continuous. To keep O2 stats >90%    . prochlorperazine (COMPAZINE) 10 MG tablet Take 1 tablet (10 mg total) every 6 (six) hours as needed by mouth for nausea or vomiting. 30 tablet 0  .  senna-docusate (SENOKOT-S) 8.6-50 MG tablet Take 1 tablet by mouth at bedtime as needed for mild constipation.    . Tiotropium Bromide Monohydrate (SPIRIVA RESPIMAT) 2.5 MCG/ACT AERS Inhale 2 puffs into the lungs daily.    [name] 20 MG TABS tablet      No current facility-administered medications for this visit.    Facility-Administered Medications Ordered in Other Visits  Medication Dose Route Frequency Provider Last Rate Last Dose  . CARBOplatin (PARAPLATIN) 340 mg in sodium chloride 0.9 % 250 mL chemo infusion  340 mg Intravenous Once Curt Bears, MD      . pegfilgrastim (NEULASTA ONPRO KIT) injection 6 mg  6 mg Subcutaneous Once Curt Bears, MD        SURGICAL HISTORY:  Past Surgical History:  Procedure Laterality Date  . BREAST SURGERY Right 1964   tumor removed  . CATARACT EXTRACTION Bilateral   . HIP SURGERY Right    fracture    REVIEW OF SYSTEMS:   Review of Systems  Constitutional: Negative for appetite change, chills, fatigue, fever and unexpected weight change.  HENT:   Negative for mouth sores, nosebleeds, sore throat and trouble swallowing.   Eyes: Negative for eye problems and icterus.  Respiratory: Negative for cough, hemoptysis, and wheezing.  She has her baseline shortness of breath.  Cardiovascular: Negative for chest pain and leg swelling.  Gastrointestinal: Negative for abdominal pain, constipation, diarrhea, nausea and vomiting.  Genitourinary: Negative for bladder incontinence, difficulty urinating, dysuria, frequency and hematuria.   Musculoskeletal: Negative for back pain, gait problem, neck pain and neck stiffness.  Skin: Negative for itching and rash.  Neurological: Negative for dizziness, extremity weakness, gait problem, headaches, light-headedness and seizures.  Hematological: Negative for adenopathy. Does not bruise/bleed easily.  Psychiatric/Behavioral: Negative for confusion, depression and sleep disturbance. The patient is not  nervous/anxious.     PHYSICAL EXAMINATION:  Temp 98.7, pulse 73, respirations 23, blood pressure 134/76, O2 sat 96% on 2 L of O2.  ECOG PERFORMANCE STATUS: 1 - Symptomatic but completely ambulatory  Physical Exam  Constitutional: Oriented to person, place, and time and well-developed, well-nourished, and in no distress. No distress.  HENT:  Head: Normocephalic and atraumatic.  Mouth/Throat: Oropharynx is clear and moist. No oropharyngeal exudate.  Eyes: Conjunctivae are normal. Right eye exhibits no discharge. Left eye exhibits no discharge. No scleral icterus.  Neck: Normal range of motion. Neck supple.  Cardiovascular: Normal rate, regular rhythm, normal heart sounds and intact distal pulses.   Pulmonary/Chest: Effort normal. No respiratory distress. No rales. Faint expiratory wheezes noted in the posterior lung fields. Abdominal: Soft. Bowel sounds are normal. Exhibits no distension and no mass. There is no tenderness.  Musculoskeletal: Normal range of motion. Exhibits no edema.  Lymphadenopathy:    No cervical adenopathy.  Neurological: Alert and oriented to  person and place. Exhibits normal muscle tone. Coordination normal. The patient was not sure what year it was.  The patient knew why she was here was able to verbalize the previously discussed side effects of chemotherapy. Skin: Skin is warm and dry. No rash noted. Not diaphoretic. No erythema. No pallor.  Psychiatric: Mood and judgment normal.  Vitals reviewed.  LABORATORY DATA: Lab Results  Component Value Date   WBC 5.4 02/04/2017   HGB 10.3 (L) 02/04/2017   HCT 33.0 (L) 02/04/2017   MCV 81.4 02/04/2017   PLT 283 02/04/2017      Chemistry      Component Value Date/Time   NA 142 02/04/2017 1100   K 4.4 02/04/2017 1100   CL 102 12/03/2016 0913   CO2 40 (H) 02/04/2017 1100   BUN 15.5 02/04/2017 1100   CREATININE 0.9 02/04/2017 1100      Component Value Date/Time   CALCIUM 10.8 (H) 02/04/2017 1100   ALKPHOS 69  02/04/2017 1100   AST 16 02/04/2017 1100   ALT 7 02/04/2017 1100   BILITOT 0.24 02/04/2017 1100       RADIOGRAPHIC STUDIES:  Ct Chest W Contrast  Result Date: 01/15/2017 CLINICAL DATA:  Followup lung cancer. EXAM: CT CHEST WITH CONTRAST TECHNIQUE: Multidetector CT imaging of the chest was performed during intravenous contrast administration. CONTRAST:  39m ISOVUE-300 IOPAMIDOL (ISOVUE-300) INJECTION 61% COMPARISON:  11/29/2016 FINDINGS: Cardiovascular: Normal heart size. No pericardial effusion. Aortic atherosclerosis. Calcification in the LAD, RCA and left circumflex coronary artery is noted. Mediastinum/Nodes: Moderate size hiatal hernia. Trachea appears patent. No mediastinal or right hilar adenopathy identified. Lungs/Pleura: Moderate to advanced changes of emphysema. The right lung is clear. Decrease in volume of the left pleural effusion. Interval re-expansion and aeration of the entire left lower lobe. Continued complete atelectasis of the left upper lobe is identified. Margins of the left perihilar lung mass are difficult to identified. Taken from the coronal series the left hilar mass is felt to measure 4.5 x 3.5 cm, image 73 of series 6. This is compared with 4.9 x 5.7 cm previously. Upper Abdomen: Pneumobilia identified.  No acute abnormality noted. Musculoskeletal: The bones appear diffusely osteopenic. Similar appearance of T10 compression fracture, image 74 of series 7. No aggressive lytic or sclerotic bone lesions identified. IMPRESSION: 1. Interval response to therapy. 2. The left perihilar lung mass has decreased in size in the interval. There has been interval re- expansion and aeration to the entire left lower lobe. The left upper lobe remains completely collapsed. 3. Left pleural effusion is decreased in volume from previous exam. 4. Hiatal hernia 5. Aortic Atherosclerosis (ICD10-I70.0) and Emphysema (ICD10-J43.9). Three vessel coronary artery calcifications noted. 6. Osteopenia and  stable T10 compression fracture. Electronically Signed   By: TKerby MoorsM.D.   On: 01/15/2017 18:06   Nm Pet Image Initial (pi) Skull Base To Thigh  Result Date: 01/28/2017 CLINICAL DATA:  Initial treatment strategy for left lung cancer. EXAM: NUCLEAR MEDICINE PET SKULL BASE TO THIGH TECHNIQUE: 7.0 mCi F-18 FDG was injected intravenously. Full-ring PET imaging was performed from the skull base to thigh after the radiotracer. CT data was obtained and used for attenuation correction and anatomic localization. FASTING BLOOD GLUCOSE:  Value: 104 mg/dl COMPARISON:  Multiple exams, including CT exams from 01/15/2017 and 12/03/2016 FINDINGS: NECK No hypermetabolic lymph nodes in the neck. Bilateral mild atherosclerotic calcification of the common carotid arteries. Intracranial chronic ischemic microvascular white matter disease. CHEST Left upper lobe suprahilar mass confluent with  suspected suprahilar adenopathy in contributing to complete obstruction of the left upper lobe and left lower lobe tracheobronchial tree. The hypermetabolic activity associated with the mass new measures approximately 4.3 by 2.3 by 3.8 cm and has a maximum standard uptake value of 20.4. There is associated complete atelectasis of the left upper lobe, with expansion and air trapping in the left lower lobe. I am skeptical that there is any significant gas exchange in the left lung given the occlusion of both the upper lobe and lower lobe bronchi. A small left pleural effusion is faintly hypermetabolic, maximum SUV approximately 2.8. Moderate-sized hiatal hernia. Coronary, aortic arch, and branch vessel atherosclerotic vascular disease. Small upper pericardial effusion. Calcified aortic valve. Notable centrilobular emphysema.  Scarring in the right middle lobe. ABDOMEN/PELVIS Mild left adrenal thickening is essentially stable from 02/25/2016, it demonstrates mild asymmetric metabolic activity with maximum SUV 4.4. Scattered multifocal  accentuated activity in the bowel is likely physiologic given the lack of a CT correlate. However, there is some right eccentric focal activity at the anus, maximum SUV 23.5, potentially with mild soft tissue prominence in this vicinity, which could represent a small anal mass. Digital rectal exam correlation recommended with particular assessment of the right anterior anal canal. Pneumobilia. Aortoiliac atherosclerotic vascular disease. 6.8 by 5.8 cm right ovarian cystic lesion without hypermetabolic activity within this lesion or along its margins. This lesion has simple fluid density characteristics. SKELETON Multiple healed bilateral rib fractures. Left sixth rib sclerosis and cortical thickening without a significant degree of metabolic activity, most likely due to an old fracture. Bony demineralization. Two screws are present in the right hip. Possible mild avascular necrosis of the right femoral head. Benign appearing compression fractures at T10, L1, L4, and L5. Thoracic kyphosis. IMPRESSION: 1. Complete obstruction of the left upper lobe bronchus and left lower lobe bronchus due to a 4.3 by 2.3 by 3.8 cm central obstructing mass with maximum SUV 20.4. This may represent a confluence of the original nodule and some underlying left hilar adenopathy. There is resulting atelectasis of the left upper lobe and likely air trapping in the left lower lobe. 2. Faintly hypermetabolic small left pleural effusion is concerning for possible malignant effusion. Benign exudative effusion less likely. 3. Mild left adrenal thickening is stable from 02/25/2016, but with mildly asymmetric accentuated metabolic activity with maximum SUV 4.4, merit surveillance. 4. There is a focus of hypermetabolic activity in the right anterior anal canal, maximum SUV 23.5, could represent a small polyp or malignancy, correlate with digital rectal exam. 5. 6.8 by 5.8 cm right ovarian cystic lesion has simple density characteristics and no  associated abnormal metabolic activity. 6. Multifocal accentuated activity in the bowel without CT correlate, favoring physiologic activity. 7. Other imaging findings of potential clinical significance: Chronic ischemic microvascular white matter disease of the brain. Moderate-sized hiatal hernia. Aortic Atherosclerosis (ICD10-I70.0). Coronary atherosclerosis. Calcified aortic valve. Emphysema (ICD10-J43.9). Pneumobilia. Multiple bilateral healed rib fractures and compression fractures at T10, L1, L4, and L5. 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 Electronically Signed   By: Van Clines M.D.   On: 01/28/2017 12:02     ASSESSMENT/PLAN:  Squamous cell lung cancer, left [facility]) This is a very pleasant 83 year old white female with highly suspicious stage IV open (T3, N1, M1a) metastatic non-small cell lung cancer, squamous cell carcinoma presented with large central obstructive left hilar mass with hilar level at The patient left pleural effusion diagnosed in September 2018. The patient is a  status post a short course of palliative radiotherapy to the central obstructing mass.  The patient is here to begin palliative chemotherapy with carboplatin for an AUC of 5 and paclitaxel 175 mg/M2 every 3 weeks with Neulasta support. The patient is interested in proceeding with systemic chemotherapy. I have again reviewed with her the adverse effect of this treatment including but not limited to alopecia, myelosuppression, nausea and vomiting, peripheral neuropathy, liver or renal dysfunction.  The patient will proceed with cycle 1 today as scheduled. She will have weekly labs at [facility] as previously arranged.  She will have a follow-up visit in 3 weeks for evaluation prior to cycle 2 of her treatment.  Patient was advised to call immediately if she has any current symptoms in the interval. The patient voices understanding of current disease status and treatment  options and is in agreement with the current care plan.  All questions were answered. The patient knows to call the clinic with any problems, questions or concerns. We can certainly see the patient much sooner if necessary.  No orders of the defined types were placed in this encounter.  Mikey Bussing, DNP, AGPCNP-BC, AOCNP 02/13/17

## 2017-02-13 NOTE — Assessment & Plan Note (Signed)
This is a very pleasant 83 year old white female with highly suspicious stage IV open (T3, N1, M1a) metastatic non-small cell lung cancer, squamous cell carcinoma presented with large central obstructive left hilar mass with hilar level at The patient left pleural effusion diagnosed in September 2018. The patient is a status post a short course of palliative radiotherapy to the central obstructing mass.  The patient is here to begin palliative chemotherapy with carboplatin for an AUC of 5 and paclitaxel 175 mg/M2 every 3 weeks with Neulasta support. The patient is interested in proceeding with systemic chemotherapy. I have again reviewed with her the adverse effect of this treatment including but not limited to alopecia, myelosuppression, nausea and vomiting, peripheral neuropathy, liver or renal dysfunction.  The patient will proceed with cycle 1 today as scheduled. She will have weekly labs at [facility] as previously arranged.  She will have a follow-up visit in 3 weeks for evaluation prior to cycle 2 of her treatment.  Patient was advised to call immediately if she has any current symptoms in the interval. The patient voices understanding of current disease status and treatment options and is in agreement with the current care plan.  All questions were answered. The patient knows to call the clinic with any problems, questions or concerns. We can certainly see the patient much sooner if necessary.

## 2017-02-13 NOTE — Patient Instructions (Signed)
   [facility] Discharge Instructions for Patients Receiving Chemotherapy  Today you received the following chemotherapy agents Taxol and Carboplatin   To help prevent nausea and vomiting after your treatment, we encourage you to take your nausea medication as directed.    If you develop nausea and vomiting that is not controlled by your nausea medication, call the clinic.   BELOW ARE SYMPTOMS THAT SHOULD BE REPORTED IMMEDIATELY:  *FEVER GREATER THAN 100.5 F  *CHILLS WITH OR WITHOUT FEVER  NAUSEA AND VOMITING THAT IS NOT CONTROLLED WITH YOUR NAUSEA MEDICATION  *UNUSUAL SHORTNESS OF BREATH  *UNUSUAL BRUISING OR BLEEDING  TENDERNESS IN MOUTH AND THROAT WITH OR WITHOUT PRESENCE OF ULCERS  *URINARY PROBLEMS  *BOWEL PROBLEMS  UNUSUAL RASH Items with * indicate a potential emergency and should be followed up as soon as possible.  Feel free to call the clinic should you have any questions or concerns. The clinic phone number is [phone].  Please show the CHEMO ALERT CARD at check-in to the Emergency Department and triage nurse.   

## 2017-02-14 NOTE — Progress Notes (Signed)
[facility] Progress Note   Arna Luis [id number] Nov 10, 1933 83 y.o.  Paula Contreras is managed by Dr. Dalbert Batman  Actively treated with chemotherapy: yes  Current Therapy: Carboplatin and paclitaxel  Last Treated: 02/13/2017  Assessment: Plan:    Infusion reaction, initial encounter  Paula Contreras was seen in the infusion room for a suspected chemotherapy reaction. She was receiving paclitaxel at the time of her reaction. She had received a total of 15 mL prior to onset of symptoms. Her symptoms included: Oxygen desaturation, hypotension, and wheezing. She was premedicated with decadron 20 mg and Aloxi 0.25 mg prior to starting chemotherapy. Paclitaxel was paused and Paula Contreras was given Solu-Medrol 125 mg IV, Pepcid 20 mg IV, and an albuterol nebulizer treatment after onset of her symptoms. Paula Contreras did  respond to intervention.  The patient's oxygen saturation rose to 97% on 3 L via nasal cannula, her blood pressure stabilized at 146/43 and her wheezing resolved. This case was discussed with Dr. Earlie Server.  Please see After Visit Summary for patient specific instructions.  Future Appointments  Date Time Provider [place]  02/20/2017 11:15 AM [facility] 3 [facility] None  02/27/2017 11:00 AM [facility] ONLY [facility] None  03/06/2017  9:00 AM [facility] 6 [facility] None  03/06/2017  9:30 AM Curcio, Roselie Awkward, NP [facility] None  03/06/2017 10:30 AM [facility] H29 [facility] None  03/13/2017 11:00 AM [facility] 2 [facility] None  03/20/2017 11:00 AM [facility] 4 [facility] None  03/28/2017  9:15 AM [facility] 1 [facility] None  03/28/2017  9:45 AM Curt Bears, MD [facility] None  03/28/2017 11:00 AM [facility] G24 [facility] None  04/03/2017 11:00 AM [facility] LAB 5 [facility] None  04/10/2017 11:00 AM [facility] 4 [facility] None  04/17/2017  9:15 AM [facility] 2  [facility] None  04/17/2017  9:45 AM Curt Bears, MD [facility] None  04/17/2017 11:00 AM [facility] G24 [facility] None    No orders of the defined types were placed in this encounter.      Subjective:   Patient ID:  Paula Contreras is a 83 y.o. (DOB 08/17/1933) female.  Chief Complaint: No chief complaint on file.   HPI  Paula Contreras was seen in the infusion room for a suspected chemotherapy reaction. She was receiving paclitaxel at the time of her reaction. She had received a total of 15 mL prior to onset of symptoms. Her symptoms included: Oxygen desaturation, hypotension, and wheezing. She was premedicated with decadron 20 mg and Aloxi 0.25 mg prior to starting chemotherapy. Paclitaxel was paused and Paula Contreras was given Solu-Medrol 125 mg IV, Pepcid 20 mg IV, and an albuterol nebulizer treatment after onset of her symptoms. Paula Contreras did  respond to intervention.  The patient's oxygen saturation rose to 97% on 3 L via nasal cannula, her blood pressure stabilized at 146/43 and her wheezing resolved.  Medications: I have reviewed the patient's current medications.  Allergies:  Allergies  Allergen Reactions  . Codeine Nausea And Vomiting  . Robitussin (Alcohol Free) [Guaifenesin] Other (See Comments)    Per MAR    Past Medical History:  Diagnosis Date  . Anemia 02/20/2015  . Aortic stenosis    a. 2D Echo 07/2013: EF 60-65%, no RWMA, grade 1 DD, normal LV filling pressure, moderate AS, mild TR, PASP 47mmHg.  . Bradycardia    a. possible bradycardia with HR 44 by physical exam notes at nephrologist office in 03/2014. F/u event monitor showed NSR PACs average  HR 81, bradycardia <1% of readable data, no pauses of 3 seconds or longer..  . Cataract   . Chronic respiratory failure ([place])    a. On home O2 since 2012.  . CKD (chronic kidney disease), stage III ([place])   . COPD (chronic obstructive pulmonary disease) ([place])   . Dementia   . Hypertension   .  Osteoporosis   . Other psoriasis and similar disorders    psoriasis vs eczema  . Paroxysmal atrial fibrillation ([place])    a. Dx 07/2013. No attempts per Epic to restore NSR but in NSR 2016.  Marland Kitchen Pneumonia 03/2016  . Skin cancer     Past Surgical History:  Procedure Laterality Date  . BREAST SURGERY Right 1964   tumor removed  . CATARACT EXTRACTION Bilateral   . ERCP N/A 02/29/2016   Procedure: ENDOSCOPIC RETROGRADE CHOLANGIOPANCREATOGRAPHY (ERCP);  Surgeon: Irene Shipper, MD;  Location: [facility];  Service: Endoscopy;  Laterality: N/A;  . HIP SURGERY Right    fracture  . VIDEO BRONCHOSCOPY Bilateral 12/04/2016   Procedure: VIDEO BRONCHOSCOPY WITHOUT FLUORO;  Surgeon: Collene Gobble, MD;  Location[facility] END[facility];  Service: Cardiopulmonary;  Laterality: Bilateral;    Family History  Problem Relation Age of Onset  . Asthma Mother   . Kidney disease Mother   . Lung cancer Father   . Asthma Maternal Grandmother   . Lung cancer Paternal Uncle        father's twin brother  . Diabetes Brother     Social History   Socioeconomic History  . Marital status: Widowed    Spouse name: Not on file  . Number of children: 0  . Years of education: Not on file  . Highest education level: Not on file  Social Needs  . Financial resource strain: Not on file  . Food insecurity - worry: Not on file  . Food insecurity - inability: Not on file  . Transportation needs - medical: Not on file  . Transportation needs - non-medical: Not on file  Occupational History  . Occupation: data entry specialist    Comment: retired  Tobacco Use  . Smoking status: Former Smoker    Packs/day: 1.00    Years: 55.00    Pack years: 55.00    Types: Cigarettes    Last attempt to quit: 09/25/2010    Years since quitting: 6.3  . Smokeless tobacco: Never Used  Substance and Sexual Activity  . Alcohol use: No  . Drug use: No  . Sexual activity: Not on file  Other Topics Concern  . Not on file  Social History  Narrative  . Not on file    Past Medical History, Surgical history, Social history, and Family history were reviewed and updated as appropriate.   Please see review of systems for further details on the patient's review from today.   Review of Systems:  Review of Systems  Constitutional: Negative for chills, diaphoresis and fever.  HENT: Negative for trouble swallowing.   Respiratory: Positive for shortness of breath and wheezing. Negative for cough, choking and chest tightness.   Cardiovascular: Negative for chest pain and palpitations.  Gastrointestinal: Negative for nausea and vomiting.    Objective:   Physical Exam:  There were no vitals taken for this visit.   Physical Exam  Constitutional: No distress.  Patient is a elderly female who is receiving oxygen via nasal cannula and who appears to be in no apparent distress.  HENT:  Head: Normocephalic and atraumatic.  Cardiovascular: Normal rate and regular rhythm. Exam reveals no gallop and no friction rub.  No murmur heard. Pulmonary/Chest: Effort normal. No respiratory distress. She has wheezes. She has no rales.  Skin: Skin is warm and dry. No rash noted. She is not diaphoretic. No erythema.   Pre-albuterol exam: See above physical exam  Post-albuterol exam: Patient's lungs cleared after she was given an albuterol nebulizer.  Lab Review:     Component Value Date/Time   NA 142 02/04/2017 1100   K 4.4 02/04/2017 1100   CL 102 12/03/2016 0913   CO2 40 (H) 02/04/2017 1100   GLUCOSE 88 02/04/2017 1100   BUN 15.5 02/04/2017 1100   CREATININE 0.9 02/04/2017 1100   CALCIUM 10.8 (H) 02/04/2017 1100   PROT 7.9 02/04/2017 1100   ALBUMIN 3.1 (L) 02/04/2017 1100   AST 16 02/04/2017 1100   ALT 7 02/04/2017 1100   ALKPHOS 69 02/04/2017 1100   BILITOT 0.24 02/04/2017 1100   GFRNONAA 49 (L) 12/03/2016 0913   GFRAA 57 (L) 12/03/2016 0913       Component Value Date/Time   WBC 5.4 02/04/2017 1100   WBC 7.4 12/03/2016 0913     RBC 4.05 02/04/2017 1100   RBC 3.30 (L) 12/03/2016 0913   HGB 10.3 (L) 02/04/2017 1100   HCT 33.0 (L) 02/04/2017 1100   PLT 283 02/04/2017 1100   MCV 81.4 02/04/2017 1100   MCH 25.3 02/04/2017 1100   MCH 23.6 (L) 12/03/2016 0913   MCHC 31.1 (L) 02/04/2017 1100   MCHC 28.5 (L) 12/03/2016 0913   RDW 18.0 (H) 02/04/2017 1100   LYMPHSABS 0.4 (L) 02/04/2017 1100   MONOABS 0.5 02/04/2017 1100   EOSABS 0.4 02/04/2017 1100   BASOSABS 0.1 02/04/2017 1100   -------------------------------  Imaging from last 24 hours (if applicable):  Radiology interpretation: Ct Chest W Contrast  Result Date: 01/15/2017 CLINICAL DATA:  Followup lung cancer. EXAM: CT CHEST WITH CONTRAST TECHNIQUE: Multidetector CT imaging of the chest was performed during intravenous contrast administration. CONTRAST:  34mL ISOVUE-300 IOPAMIDOL (ISOVUE-300) INJECTION 61% COMPARISON:  11/29/2016 FINDINGS: Cardiovascular: Normal heart size. No pericardial effusion. Aortic atherosclerosis. Calcification in the LAD, RCA and left circumflex coronary artery is noted. Mediastinum/Nodes: Moderate size hiatal hernia. Trachea appears patent. No mediastinal or right hilar adenopathy identified. Lungs/Pleura: Moderate to advanced changes of emphysema. The right lung is clear. Decrease in volume of the left pleural effusion. Interval re-expansion and aeration of the entire left lower lobe. Continued complete atelectasis of the left upper lobe is identified. Margins of the left perihilar lung mass are difficult to identified. Taken from the coronal series the left hilar mass is felt to measure 4.5 x 3.5 cm, image 73 of series 6. This is compared with 4.9 x 5.7 cm previously. Upper Abdomen: Pneumobilia identified.  No acute abnormality noted. Musculoskeletal: The bones appear diffusely osteopenic. Similar appearance of T10 compression fracture, image 74 of series 7. No aggressive lytic or sclerotic bone lesions identified. IMPRESSION: 1. Interval  response to therapy. 2. The left perihilar lung mass has decreased in size in the interval. There has been interval re- expansion and aeration to the entire left lower lobe. The left upper lobe remains completely collapsed. 3. Left pleural effusion is decreased in volume from previous exam. 4. Hiatal hernia 5. Aortic Atherosclerosis (ICD10-I70.0) and Emphysema (ICD10-J43.9). Three vessel coronary artery calcifications noted. 6. Osteopenia and stable T10 compression fracture. Electronically Signed   By: Kerby Moors M.D.   On: 01/15/2017 18:06  Nm Pet Image Initial (pi) Skull Base To Thigh  Result Date: 01/28/2017 CLINICAL DATA:  Initial treatment strategy for left lung cancer. EXAM: NUCLEAR MEDICINE PET SKULL BASE TO THIGH TECHNIQUE: 7.0 mCi F-18 FDG was injected intravenously. Full-ring PET imaging was performed from the skull base to thigh after the radiotracer. CT data was obtained and used for attenuation correction and anatomic localization. FASTING BLOOD GLUCOSE:  Value: 104 mg/dl COMPARISON:  Multiple exams, including CT exams from 01/15/2017 and 12/03/2016 FINDINGS: NECK No hypermetabolic lymph nodes in the neck. Bilateral mild atherosclerotic calcification of the common carotid arteries. Intracranial chronic ischemic microvascular white matter disease. CHEST Left upper lobe suprahilar mass confluent with suspected suprahilar adenopathy in contributing to complete obstruction of the left upper lobe and left lower lobe tracheobronchial tree. The hypermetabolic activity associated with the mass new measures approximately 4.3 by 2.3 by 3.8 cm and has a maximum standard uptake value of 20.4. There is associated complete atelectasis of the left upper lobe, with expansion and air trapping in the left lower lobe. I am skeptical that there is any significant gas exchange in the left lung given the occlusion of both the upper lobe and lower lobe bronchi. A small left pleural effusion is faintly  hypermetabolic, maximum SUV approximately 2.8. Moderate-sized hiatal hernia. Coronary, aortic arch, and branch vessel atherosclerotic vascular disease. Small upper pericardial effusion. Calcified aortic valve. Notable centrilobular emphysema.  Scarring in the right middle lobe. ABDOMEN/PELVIS Mild left adrenal thickening is essentially stable from 02/25/2016, it demonstrates mild asymmetric metabolic activity with maximum SUV 4.4. Scattered multifocal accentuated activity in the bowel is likely physiologic given the lack of a CT correlate. However, there is some right eccentric focal activity at the anus, maximum SUV 23.5, potentially with mild soft tissue prominence in this vicinity, which could represent a small anal mass. Digital rectal exam correlation recommended with particular assessment of the right anterior anal canal. Pneumobilia. Aortoiliac atherosclerotic vascular disease. 6.8 by 5.8 cm right ovarian cystic lesion without hypermetabolic activity within this lesion or along its margins. This lesion has simple fluid density characteristics. SKELETON Multiple healed bilateral rib fractures. Left sixth rib sclerosis and cortical thickening without a significant degree of metabolic activity, most likely due to an old fracture. Bony demineralization. Two screws are present in the right hip. Possible mild avascular necrosis of the right femoral head. Benign appearing compression fractures at T10, L1, L4, and L5. Thoracic kyphosis. IMPRESSION: 1. Complete obstruction of the left upper lobe bronchus and left lower lobe bronchus due to a 4.3 by 2.3 by 3.8 cm central obstructing mass with maximum SUV 20.4. This may represent a confluence of the original nodule and some underlying left hilar adenopathy. There is resulting atelectasis of the left upper lobe and likely air trapping in the left lower lobe. 2. Faintly hypermetabolic small left pleural effusion is concerning for possible malignant effusion. Benign  exudative effusion less likely. 3. Mild left adrenal thickening is stable from 02/25/2016, but with mildly asymmetric accentuated metabolic activity with maximum SUV 4.4, merit surveillance. 4. There is a focus of hypermetabolic activity in the right anterior anal canal, maximum SUV 23.5, could represent a small polyp or malignancy, correlate with digital rectal exam. 5. 6.8 by 5.8 cm right ovarian cystic lesion has simple density characteristics and no associated abnormal metabolic activity. 6. Multifocal accentuated activity in the bowel without CT correlate, favoring physiologic activity. 7. Other imaging findings of potential clinical significance: Chronic ischemic microvascular white matter disease of the brain.  Moderate-sized hiatal hernia. Aortic Atherosclerosis (ICD10-I70.0). Coronary atherosclerosis. Calcified aortic valve. Emphysema (ICD10-J43.9). Pneumobilia. Multiple bilateral healed rib fractures and compression fractures at T10, L1, L4, and L5. 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 Electronically Signed   By: Van Clines M.D.   On: 01/28/2017 12:02        This case was discussed with Dr. Julien Nordmann. He expressed agreement with my management of this patient.

## 2017-02-15 ENCOUNTER — Telehealth: Payer: Self-pay

## 2017-02-15 NOTE — Telephone Encounter (Signed)
Paula Contreras at [facility] called that onpro had fallen off. Per infusion note it would have injected at 2047 on 11/15.  onpro device found in the bed this morning. She normally gets in the bed about 6 but she stays awake until about 10 pm. They do not know when it fell off.   S/w Mikey Bussing NP called [facility] back. Watch temp. For anything => 100.5 give [facility] a call. Make sure labs are done on Tuesday so we can get them by Wednesday.   Called Paula Contreras again. She had talked with staff and found out the onpro came off during bath this morning so pt did get the medication. Also they are getting a new lab company that will draw blood early Wednesday morning and have results same day.

## 2017-02-20 ENCOUNTER — Other Ambulatory Visit: Payer: Medicare Other

## 2017-02-20 ENCOUNTER — Telehealth: Payer: Self-pay | Admitting: Medical Oncology

## 2017-02-20 NOTE — Telephone Encounter (Signed)
Blood had to be redrawn. Lab results will be faxed friday

## 2017-02-22 ENCOUNTER — Telehealth: Payer: Self-pay | Admitting: *Deleted

## 2017-02-22 NOTE — Telephone Encounter (Signed)
Lab results from [facility] facility via fax, reviewed with MD, Debbra Riding, RN at [place] with instructions for neutropenic precautions.

## 2017-02-27 ENCOUNTER — Other Ambulatory Visit: Payer: Medicare Other

## 2017-03-06 ENCOUNTER — Other Ambulatory Visit (HOSPITAL_BASED_OUTPATIENT_CLINIC_OR_DEPARTMENT_OTHER): Payer: Medicare Other

## 2017-03-06 ENCOUNTER — Ambulatory Visit (HOSPITAL_BASED_OUTPATIENT_CLINIC_OR_DEPARTMENT_OTHER): Payer: Medicare Other | Admitting: Oncology

## 2017-03-06 ENCOUNTER — Ambulatory Visit (HOSPITAL_BASED_OUTPATIENT_CLINIC_OR_DEPARTMENT_OTHER): Payer: Medicare Other

## 2017-03-06 ENCOUNTER — Encounter: Payer: Self-pay | Admitting: Oncology

## 2017-03-06 VITALS — BP 132/72 | HR 68 | Temp 98.0°F | Resp 16

## 2017-03-06 VITALS — BP 118/55 | HR 93 | Temp 98.2°F | Resp 18 | Ht 66.0 in | Wt 128.3 lb

## 2017-03-06 DIAGNOSIS — C3402 Malignant neoplasm of left main bronchus: Secondary | ICD-10-CM | POA: Diagnosis not present

## 2017-03-06 DIAGNOSIS — Z5111 Encounter for antineoplastic chemotherapy: Secondary | ICD-10-CM

## 2017-03-06 DIAGNOSIS — C3412 Malignant neoplasm of upper lobe, left bronchus or lung: Secondary | ICD-10-CM

## 2017-03-06 DIAGNOSIS — Z5189 Encounter for other specified aftercare: Secondary | ICD-10-CM | POA: Diagnosis not present

## 2017-03-06 DIAGNOSIS — C3492 Malignant neoplasm of unspecified part of left bronchus or lung: Secondary | ICD-10-CM

## 2017-03-06 DIAGNOSIS — J910 Malignant pleural effusion: Secondary | ICD-10-CM | POA: Diagnosis not present

## 2017-03-06 LAB — COMPREHENSIVE METABOLIC PANEL
ALT: 10 U/L (ref 0–55)
AST: 16 U/L (ref 5–34)
Albumin: 3.1 g/dL — ABNORMAL LOW (ref 3.5–5.0)
Alkaline Phosphatase: 66 U/L (ref 40–150)
Anion Gap: 8 mEq/L (ref 3–11)
BUN: 11.7 mg/dL (ref 7.0–26.0)
CALCIUM: 10.1 mg/dL (ref 8.4–10.4)
CHLORIDE: 97 meq/L — AB (ref 98–109)
CO2: 35 mEq/L — ABNORMAL HIGH (ref 22–29)
CREATININE: 1 mg/dL (ref 0.6–1.1)
EGFR: 53 mL/min/{1.73_m2} — ABNORMAL LOW (ref >60)
Glucose: 95 mg/dl (ref 70–140)
Potassium: 4.6 mEq/L (ref 3.5–5.1)
Sodium: 140 mEq/L (ref 136–145)
TOTAL PROTEIN: 6.9 g/dL (ref 6.4–8.3)

## 2017-03-06 LAB — CBC WITH DIFFERENTIAL/PLATELET
BASO%: 1.1 % (ref 0.0–2.0)
BASOS ABS: 0.1 10*3/uL (ref 0.0–0.1)
EOS ABS: 0 10*3/uL (ref 0.0–0.5)
EOS%: 0.7 % (ref 0.0–7.0)
HEMATOCRIT: 30.4 % — AB (ref 34.8–46.6)
HEMOGLOBIN: 9.6 g/dL — AB (ref 11.6–15.9)
LYMPH%: 9.8 % — AB (ref 14.0–49.7)
MCH: 25.9 pg (ref 25.1–34.0)
MCHC: 31.6 g/dL (ref 31.5–36.0)
MCV: 81.8 fL (ref 79.5–101.0)
MONO#: 0.6 10*3/uL (ref 0.1–0.9)
MONO%: 11.1 % (ref 0.0–14.0)
NEUT#: 4.4 10*3/uL (ref 1.5–6.5)
NEUT%: 77.3 % — ABNORMAL HIGH (ref 38.4–76.8)
Platelets: 340 10*3/uL (ref 145–400)
RBC: 3.71 10*6/uL (ref 3.70–5.45)
RDW: 16.1 % — ABNORMAL HIGH (ref 11.2–14.5)
WBC: 5.7 10*3/uL (ref 3.9–10.3)
lymph#: 0.6 10*3/uL — ABNORMAL LOW (ref 0.9–3.3)

## 2017-03-06 MED ORDER — SODIUM CHLORIDE 0.9 % IV SOLN
20.0000 mg | Freq: Once | INTRAVENOUS | Status: AC
  Administered 2017-03-06: 20 mg via INTRAVENOUS
  Filled 2017-03-06: qty 2

## 2017-03-06 MED ORDER — PALONOSETRON HCL INJECTION 0.25 MG/5ML
INTRAVENOUS | Status: AC
  Filled 2017-03-06: qty 5

## 2017-03-06 MED ORDER — SODIUM CHLORIDE 0.9 % IV SOLN
175.0000 mg/m2 | Freq: Once | INTRAVENOUS | Status: AC
  Administered 2017-03-06: 294 mg via INTRAVENOUS
  Filled 2017-03-06: qty 49

## 2017-03-06 MED ORDER — FAMOTIDINE IN NACL 20-0.9 MG/50ML-% IV SOLN: 20.0000 mg | Freq: Once | INTRAVENOUS | Status: DC

## 2017-03-06 MED ORDER — DIPHENHYDRAMINE HCL 50 MG/ML IJ SOLN
INTRAMUSCULAR | Status: AC
  Filled 2017-03-06: qty 1

## 2017-03-06 MED ORDER — SODIUM CHLORIDE 0.9 % IV SOLN
Freq: Once | INTRAVENOUS | Status: AC
  Administered 2017-03-06: 10:00:00 via INTRAVENOUS

## 2017-03-06 MED ORDER — SODIUM CHLORIDE 0.9 % IV SOLN
340.0000 mg | Freq: Once | INTRAVENOUS | Status: AC
  Administered 2017-03-06: 340 mg via INTRAVENOUS
  Filled 2017-03-06: qty 34

## 2017-03-06 MED ORDER — DIPHENHYDRAMINE HCL 50 MG/ML IJ SOLN
50.0000 mg | Freq: Once | INTRAMUSCULAR | Status: AC
  Administered 2017-03-06: 50 mg via INTRAVENOUS

## 2017-03-06 MED ORDER — FAMOTIDINE IN NACL 20-0.9 MG/50ML-% IV SOLN
INTRAVENOUS | Status: AC
  Filled 2017-03-06: qty 50

## 2017-03-06 MED ORDER — PALONOSETRON HCL INJECTION 0.25 MG/5ML
0.2500 mg | Freq: Once | INTRAVENOUS | Status: AC
  Administered 2017-03-06: 0.25 mg via INTRAVENOUS

## 2017-03-06 MED ORDER — PEGFILGRASTIM 6 MG/0.6ML ~~LOC~~ PSKT
6.0000 mg | PREFILLED_SYRINGE | Freq: Once | SUBCUTANEOUS | Status: AC
  Administered 2017-03-06: 6 mg via SUBCUTANEOUS
  Filled 2017-03-06: qty 0.6

## 2017-03-06 MED ORDER — FAMOTIDINE IN NACL 20-0.9 MG/50ML-% IV SOLN
40.0000 mg | Freq: Once | INTRAVENOUS | Status: AC
Start: 2017-03-06 — End: 2017-03-06
  Administered 2017-03-06: 40 mg via INTRAVENOUS

## 2017-03-06 NOTE — Progress Notes (Signed)
[facility] OFFICE PROGRESS NOTE  System, Pcp Not In No address on file  DIAGNOSIS: Highly suspicious stage IV open (T3, N1, M1a) metastatic non-small cell lung cancer,squamous cell carcinoma presented with large central obstructive left hilar mass with hilar level at The patient left pleural effusion diagnosed in September 2018. The patient is a status post a short course of palliative radiotherapy to the central obstructing mass.  PRIOR THERAPY: Palliative radiotherapy to the left mainstem bronchus for a total dose of 20 GY in 5 fractions completed on 12/14/2016.  CURRENT THERAPY: Palliative systemic chemotherapy with carboplatin for an AUC of 5 and paclitaxel 175 mg/m every 3 weeks with Neulasta support.  First dose 02/13/2017.  INTERVAL HISTORY: Paula Contreras 83 y.o. female returns for routine follow-up visit accompanied by the driver from her assisted living facility.  The patient is feeling well today has no specific complaints except for her baseline shortness of breath and cough.  She denies fevers and chills.  Denies chest pain and hemoptysis.  Denies nausea, vomiting, constipation, diarrhea.  Patient is here for evaluation prior to cycle 2 of her chemotherapy.  MEDICAL HISTORY: Past Medical History:  Diagnosis Date  . Anemia 02/20/2015  . Aortic stenosis    a. 2D Echo 07/2013: EF 60-65%, no RWMA, grade 1 DD, normal LV filling pressure, moderate AS, mild TR, PASP 2mHg.  . Bradycardia    a. possible bradycardia with HR 44 by physical exam notes at nephrologist office in 03/2014. F/u event monitor showed NSR PACs average HR 81, bradycardia <1% of readable data, no pauses of 3 seconds or longer..  . Cataract   . Chronic respiratory failure ([place]    a. On home O2 since 2012.  . CKD (chronic kidney disease), stage III ([place]   . COPD (chronic obstructive pulmonary disease) ([place]   . Dementia   . Hypertension   . Osteoporosis   . Other psoriasis and similar  disorders    psoriasis vs eczema  . Paroxysmal atrial fibrillation ([place]    a. Dx 07/2013. No attempts per Epic to restore NSR but in NSR 2016.  .Marland KitchenPneumonia 03/2016  . Skin cancer     ALLERGIES:  is allergic to codeine and robitussin (alcohol free) [guaifenesin].  MEDICATIONS:  Current Outpatient Medications  Medication Sig Dispense Refill  . acetaminophen (TYLENOL) 325 MG tablet Take 650 mg by mouth every 6 (six) hours as needed (for pain).     .Marland Kitchenalbuterol (PROAIR HFA) 108 (90 BASE) MCG/ACT inhaler Inhale 2 puffs into the lungs every 4 (four) hours as needed for wheezing or shortness of breath. 1 Inhaler 3  . alendronate (FOSAMAX) 70 MG tablet Take 70 mg by mouth every Monday. Take with a full glass of water on an empty stomach.    . benzonatate (TESSALON) 100 MG capsule Take 1 capsule (100 mg total) by mouth 3 (three) times daily as needed for cough. 20 capsule 0  . calcium carbonate (OS-CAL) 600 MG TABS tablet Take 2 tablets by mouth daily with breakfast.     . carvedilol (COREG) 3.125 MG tablet     . Cholecalciferol (VITAMIN D) 2000 UNITS CAPS Take 2,000 Units by mouth daily with breakfast.     . esomeprazole (NEXIUM) 20 MG capsule Take 20 mg by mouth daily at 12 noon.    . feeding supplement (BOOST / RESOURCE BREEZE) LIQD Take 1 Container by mouth every evening.     . fluticasone (FLONASE) 50 MCG/ACT nasal spray Place  1 spray into both nostrils daily.  2  . hydrocortisone 2.5 % cream     . ipratropium-albuterol (DUONEB) 0.5-2.5 (3) MG/3ML SOLN     . levalbuterol (XOPENEX) 0.63 MG/3ML nebulizer solution Take 3 mLs (0.63 mg total) by nebulization every 6 (six) hours as needed for wheezing or shortness of breath.    . Multiple Vitamins-Iron (MULTIVITAMIN/IRON PO) Take 1 tablet by mouth daily.    . OXYGEN Inhale 2-4 L into the lungs continuous. To keep O2 stats >90%    . prochlorperazine (COMPAZINE) 10 MG tablet Take 1 tablet (10 mg total) every 6 (six) hours as needed by mouth for nausea  or vomiting. 30 tablet 0  . senna-docusate (SENOKOT-S) 8.6-50 MG tablet Take 1 tablet by mouth at bedtime as needed for mild constipation.    . Tiotropium Bromide Monohydrate (SPIRIVA RESPIMAT) 2.5 MCG/ACT AERS Inhale 2 puffs into the lungs daily.    [name] 20 MG TABS tablet      No current facility-administered medications for this visit.    Facility-Administered Medications Ordered in Other Visits  Medication Dose Route Frequency Provider Last Rate Last Dose  . CARBOplatin (PARAPLATIN) 340 mg in sodium chloride 0.9 % 250 mL chemo infusion  340 mg Intravenous Once Curt Bears, MD 568 mL/hr at 03/06/17 1606 340 mg at 03/06/17 1606  . pegfilgrastim (NEULASTA ONPRO KIT) injection 6 mg  6 mg Subcutaneous Once Curt Bears, MD        SURGICAL HISTORY:  Past Surgical History:  Procedure Laterality Date  . BREAST SURGERY Right 1964   tumor removed  . CATARACT EXTRACTION Bilateral   . ERCP N/A 02/29/2016   Procedure: ENDOSCOPIC RETROGRADE CHOLANGIOPANCREATOGRAPHY (ERCP);  Surgeon: Irene Shipper, MD;  Location: [facility];  Service: Endoscopy;  Laterality: N/A;  . HIP SURGERY Right    fracture  . VIDEO BRONCHOSCOPY Bilateral 12/04/2016   Procedure: VIDEO BRONCHOSCOPY WITHOUT FLUORO;  Surgeon: Collene Gobble, MD;  Location: [facility] ENDOSCOPY;  Service: Cardiopulmonary;  Laterality: Bilateral;    REVIEW OF SYSTEMS:   Review of Systems  Constitutional: Negative for appetite change, chills, fatigue, fever and unexpected weight change.  HENT:   Negative for mouth sores, nosebleeds, sore throat and trouble swallowing.   Eyes: Negative for eye problems and icterus.  Respiratory: Negative for hemoptysis and wheezing.  Positive for cough and baseline shortness of breath. Cardiovascular: Negative for chest pain and leg swelling.  Gastrointestinal: Negative for abdominal pain, constipation, diarrhea, nausea and vomiting.  Genitourinary: Negative for bladder incontinence, difficulty urinating,  dysuria, frequency and hematuria.   Musculoskeletal: Negative for back pain, gait problem, neck pain and neck stiffness.  Skin: Negative for itching and rash.  Neurological: Negative for dizziness, extremity weakness, gait problem, headaches, light-headedness and seizures.  Hematological: Negative for adenopathy. Does not bruise/bleed easily.  Psychiatric/Behavioral: Negative for confusion, depression and sleep disturbance. The patient is not nervous/anxious.     PHYSICAL EXAMINATION:  Blood pressure (!) 118/55, pulse 93, temperature 98.2 F (36.8 C), temperature source Oral, resp. rate 18, height 5' 6"  (1.676 m), weight 128 lb 4.8 oz (58.2 kg), SpO2 97 %.  ECOG PERFORMANCE STATUS: 2 - Symptomatic, <50% confined to bed  Physical Exam  Constitutional: Oriented to person and place. No distress.  HENT:  Head: Normocephalic and atraumatic.  Mouth/Throat: Oropharynx is clear and moist. No oropharyngeal exudate.  Eyes: Conjunctivae are normal. Right eye exhibits no discharge. Left eye exhibits no discharge. No scleral icterus.  Neck: Normal range of motion.  Neck supple.  Cardiovascular: Normal rate, regular rhythm, normal heart sounds and intact distal pulses.   Pulmonary/Chest: Effort normal and breath sounds normal. No respiratory distress. No wheezes. No rales.  Abdominal: Soft. Bowel sounds are normal. Exhibits no distension and no mass. There is no tenderness.  Musculoskeletal: Normal range of motion. Exhibits no edema.  Lymphadenopathy:    No cervical adenopathy.  Neurological: Alert and oriented to person and place. Exhibits normal muscle tone. Coordination normal.  Skin: Skin is warm and dry. No rash noted. Not diaphoretic. No erythema. No pallor.  Psychiatric: Mood and judgment normal.  Vitals reviewed.  LABORATORY DATA: Lab Results  Component Value Date   WBC 5.7 03/06/2017   HGB 9.6 (L) 03/06/2017   HCT 30.4 (L) 03/06/2017   MCV 81.8 03/06/2017   PLT 340 03/06/2017       Chemistry      Component Value Date/Time   NA 140 03/06/2017 0902   K 4.6 03/06/2017 0902   CL 102 12/03/2016 0913   CO2 35 (H) 03/06/2017 0902   BUN 11.7 03/06/2017 0902   CREATININE 1.0 03/06/2017 0902      Component Value Date/Time   CALCIUM 10.1 03/06/2017 0902   ALKPHOS 66 03/06/2017 0902   AST 16 03/06/2017 0902   ALT 10 03/06/2017 0902   BILITOT <0.22 03/06/2017 0902       RADIOGRAPHIC STUDIES:  No results found.   ASSESSMENT/PLAN:  Squamous cell lung cancer, left [facility]) This is a very pleasant 83 year old white female with highly suspicious stage IV open (T3, N1, M1a) metastatic non-small cell lung cancer,squamous cell carcinoma presented with large central obstructive left hilar mass with hilar level at The patient left pleural effusion diagnosed in September 2018. The patient is a status post a short course of palliative radiotherapy to the central obstructing mass. The patient is receiving palliative chemotherapy with carboplatin for an AUC of 5 and paclitaxel 175 mg/M2 every 3 weeks with Neulasta support.  Status post 1 cycle which she tolerated well overall.  The patient will proceed with cycle 2 today as scheduled. She will have weekly labs at [facility] as previously arranged.  She will have a follow-up visit in 3 weeks for evaluation prior to cycle 3 of her treatment.  Patient was advised to call immediately if she has any current symptoms in the interval. The patient voices understanding of current disease status and treatment options and is in agreement with the current care plan.  All questions were answered. The patient knows to call the clinic with any problems, questions or concerns. We can certainly see the patient much sooner if necessary.  No orders of the defined types were placed in this encounter.   Mikey Bussing, DNP, AGPCNP-BC, AOCNP 03/06/17

## 2017-03-06 NOTE — Patient Instructions (Signed)
   [facility] Discharge Instructions for Patients Receiving Chemotherapy  Today you received the following chemotherapy agents Taxol and Carboplatin   To help prevent nausea and vomiting after your treatment, we encourage you to take your nausea medication as directed.    If you develop nausea and vomiting that is not controlled by your nausea medication, call the clinic.   BELOW ARE SYMPTOMS THAT SHOULD BE REPORTED IMMEDIATELY:  *FEVER GREATER THAN 100.5 F  *CHILLS WITH OR WITHOUT FEVER  NAUSEA AND VOMITING THAT IS NOT CONTROLLED WITH YOUR NAUSEA MEDICATION  *UNUSUAL SHORTNESS OF BREATH  *UNUSUAL BRUISING OR BLEEDING  TENDERNESS IN MOUTH AND THROAT WITH OR WITHOUT PRESENCE OF ULCERS  *URINARY PROBLEMS  *BOWEL PROBLEMS  UNUSUAL RASH Items with * indicate a potential emergency and should be followed up as soon as possible.  Feel free to call the clinic should you have any questions or concerns. The clinic phone number is [phone].  Please show the CHEMO ALERT CARD at check-in to the Emergency Department and triage nurse.   

## 2017-03-06 NOTE — Assessment & Plan Note (Signed)
This is a very pleasant 83 year old white female with highly suspicious stage IV open (T3, N1, M1a) metastatic non-small cell lung cancer,squamous cell carcinoma presented with large central obstructive left hilar mass with hilar level at The patient left pleural effusion diagnosed in September 2018. The patient is a status post a short course of palliative radiotherapy to the central obstructing mass. The patient is receiving palliative chemotherapy with carboplatin for an AUC of 5 and paclitaxel 175 mg/M2 every 3 weeks with Neulasta support.  Status post 1 cycle which she tolerated well overall.  The patient will proceed with cycle 2 today as scheduled. She will have weekly labs at [facility] as previously arranged.  She will have a follow-up visit in 3 weeks for evaluation prior to cycle 3 of her treatment.  Patient was advised to call immediately if she has any current symptoms in the interval. The patient voices understanding of current disease status and treatment options and is in agreement with the current care plan.  All questions were answered. The patient knows to call the clinic with any problems, questions or concerns. We can certainly see the patient much sooner if necessary.

## 2017-03-06 NOTE — Progress Notes (Signed)
ONPRO information and instructions as to when it will infuse and when to take it off are written on discharge paperwork, and on a piece of tape placed next to the ONPRO on pt abdomen.

## 2017-03-13 ENCOUNTER — Other Ambulatory Visit: Payer: Medicare Other

## 2017-03-20 ENCOUNTER — Other Ambulatory Visit: Payer: Medicare Other

## 2017-03-21 ENCOUNTER — Other Ambulatory Visit: Payer: Self-pay | Admitting: Medical Oncology

## 2017-03-21 ENCOUNTER — Telehealth: Payer: Self-pay | Admitting: Medical Oncology

## 2017-03-21 DIAGNOSIS — D649 Anemia, unspecified: Secondary | ICD-10-CM

## 2017-03-21 NOTE — Telephone Encounter (Signed)
Schedule request sent for blood transfusion tomorrow or saturday

## 2017-03-21 NOTE — Progress Notes (Signed)
Labs and blood transfusion appts requested

## 2017-03-22 ENCOUNTER — Other Ambulatory Visit (HOSPITAL_BASED_OUTPATIENT_CLINIC_OR_DEPARTMENT_OTHER): Payer: Medicare Other

## 2017-03-22 ENCOUNTER — Ambulatory Visit (HOSPITAL_BASED_OUTPATIENT_CLINIC_OR_DEPARTMENT_OTHER): Payer: Medicare Other

## 2017-03-22 ENCOUNTER — Ambulatory Visit (HOSPITAL_COMMUNITY)
Admission: RE | Admit: 2017-03-22 | Discharge: 2017-03-22 | Disposition: A | Discharge status: Home / Self Care | Payer: Medicare Other | Source: Ambulatory Visit | Attending: Internal Medicine | Admitting: Internal Medicine

## 2017-03-22 ENCOUNTER — Other Ambulatory Visit: Payer: Self-pay | Admitting: Medical Oncology

## 2017-03-22 DIAGNOSIS — C3402 Malignant neoplasm of left main bronchus: Secondary | ICD-10-CM | POA: Diagnosis not present

## 2017-03-22 DIAGNOSIS — C3492 Malignant neoplasm of unspecified part of left bronchus or lung: Secondary | ICD-10-CM

## 2017-03-22 DIAGNOSIS — D649 Anemia, unspecified: Secondary | ICD-10-CM

## 2017-03-22 LAB — COMPREHENSIVE METABOLIC PANEL
ALBUMIN: 3.4 g/dL — AB (ref 3.5–5.0)
ALK PHOS: 77 U/L (ref 40–150)
ALT: 12 U/L (ref 0–55)
AST: 15 U/L (ref 5–34)
Anion Gap: 8 mEq/L (ref 3–11)
BILIRUBIN TOTAL: 0.22 mg/dL (ref 0.20–1.20)
BUN: 26.1 mg/dL — AB (ref 7.0–26.0)
CALCIUM: 10.4 mg/dL (ref 8.4–10.4)
CO2: 35 mEq/L — ABNORMAL HIGH (ref 22–29)
Chloride: 98 mEq/L (ref 98–109)
Creatinine: 1.1 mg/dL (ref 0.6–1.1)
EGFR: 48 mL/min/{1.73_m2} — AB (ref >60)
GLUCOSE: 99 mg/dL (ref 70–140)
Potassium: 5 mEq/L (ref 3.5–5.1)
SODIUM: 140 meq/L (ref 136–145)
TOTAL PROTEIN: 7.2 g/dL (ref 6.4–8.3)

## 2017-03-22 LAB — CBC WITH DIFFERENTIAL/PLATELET
BASO%: 0.2 % (ref 0.0–2.0)
Basophils Absolute: 0 10*3/uL (ref 0.0–0.1)
EOS ABS: 0 10*3/uL (ref 0.0–0.5)
EOS%: 0.8 % (ref 0.0–7.0)
HEMATOCRIT: 26.7 % — AB (ref 34.8–46.6)
HEMOGLOBIN: 8.4 g/dL — AB (ref 11.6–15.9)
LYMPH#: 0.6 10*3/uL — AB (ref 0.9–3.3)
LYMPH%: 10.5 % — ABNORMAL LOW (ref 14.0–49.7)
MCH: 26 pg (ref 25.1–34.0)
MCHC: 31.5 g/dL (ref 31.5–36.0)
MCV: 82.7 fL (ref 79.5–101.0)
MONO#: 0.4 10*3/uL (ref 0.1–0.9)
MONO%: 6.7 % (ref 0.0–14.0)
NEUT%: 81.8 % — ABNORMAL HIGH (ref 38.4–76.8)
NEUTROS ABS: 4.6 10*3/uL (ref 1.5–6.5)
Platelets: 26 10*3/uL — ABNORMAL LOW (ref 145–400)
RBC: 3.23 10*6/uL — ABNORMAL LOW (ref 3.70–5.45)
RDW: 16.2 % — AB (ref 11.2–14.5)
WBC: 5.6 10*3/uL (ref 3.9–10.3)

## 2017-03-22 LAB — PREPARE RBC (CROSSMATCH)

## 2017-03-22 LAB — ABO/RH: ABO/RH(D): A POS

## 2017-03-22 MED ORDER — SODIUM CHLORIDE 0.9 % IV SOLN
250.0000 mL | Freq: Once | INTRAVENOUS | Status: AC
  Administered 2017-03-22: 250 mL via INTRAVENOUS

## 2017-03-22 MED ORDER — DIPHENHYDRAMINE HCL 25 MG PO CAPS
ORAL_CAPSULE | ORAL | Status: AC
  Filled 2017-03-22: qty 1

## 2017-03-22 MED ORDER — DIPHENHYDRAMINE HCL 12.5 MG/5ML PO ELIX
25.0000 mg | ORAL_SOLUTION | Freq: Once | ORAL | Status: AC
  Administered 2017-03-22: 25 mg via ORAL
  Filled 2017-03-22: qty 10

## 2017-03-22 MED ORDER — ACETAMINOPHEN 325 MG PO TABS: 650.0000 mg | ORAL_TABLET | Freq: Once | ORAL | Status: DC

## 2017-03-22 MED ORDER — ACETAMINOPHEN 325 MG PO TABS
ORAL_TABLET | ORAL | Status: AC
  Filled 2017-03-22: qty 2

## 2017-03-22 MED ORDER — ACETAMINOPHEN 160 MG/5ML PO SOLN
650.0000 mg | Freq: Once | ORAL | Status: AC
  Administered 2017-03-22: 650 mg via ORAL
  Filled 2017-03-22: qty 20.3

## 2017-03-22 MED ORDER — DIPHENHYDRAMINE HCL 25 MG PO CAPS: 25.0000 mg | ORAL_CAPSULE | Freq: Once | ORAL | Status: DC

## 2017-03-22 NOTE — Patient Instructions (Signed)
Blood Transfusion, Adult, Care After This sheet gives you information about how to care for yourself after your procedure. Your health care provider may also give you more specific instructions. If you have problems or questions, contact your health care provider. What can I expect after the procedure? After your procedure, it is common to have:  Bruising and soreness where the IV tube was inserted.  Headache.  Follow these instructions at home:  Take over-the-counter and prescription medicines only as told by your health care provider.  Return to your normal activities as told by your health care provider.  Follow instructions from your health care provider about how to take care of your IV insertion site. Make sure you: ? Wash your hands with soap and water before you change your bandage (dressing). If soap and water are not available, use hand sanitizer. ? Change your dressing as told by your health care provider.  Check your IV insertion site every day for signs of infection. Check for: ? More redness, swelling, or pain. ? More fluid or blood. ? Warmth. ? Pus or a bad smell. Contact a health care provider if:  You have more redness, swelling, or pain around the IV insertion site.  You have more fluid or blood coming from the IV insertion site.  Your IV insertion site feels warm to the touch.  You have pus or a bad smell coming from the IV insertion site.  Your urine turns pink, red, or brown.  You feel weak after doing your normal activities. Get help right away if:  You have signs of a serious allergic or immune system reaction, including: ? Itchiness. ? Hives. ? Trouble breathing. ? Anxiety. ? Chest or lower back pain. ? Fever, flushing, and chills. ? Rapid pulse. ? Rash. ? Diarrhea. ? Vomiting. ? Dark urine. ? Serious headache. ? Dizziness. ? Stiff neck. ? Yellow coloration of the face or the white parts of the eyes (jaundice). This information is not  intended to replace advice given to you by your health care provider. Make sure you discuss any questions you have with your health care provider. Document Released: 04/09/2014 Document Revised: 11/16/2015 Document Reviewed: 10/03/2015 Elsevier Interactive Patient Education  2018 Elsevier Inc.  

## 2017-03-26 LAB — BPAM RBC
BLOOD PRODUCT EXPIRATION DATE: 201901152359
Blood Product Expiration Date: 201901152359
ISSUE DATE / TIME: 201812211105
ISSUE DATE / TIME: 201812211105
Unit Type and Rh: 6200
Unit Type and Rh: 6200

## 2017-03-26 LAB — TYPE AND SCREEN
ABO/RH(D): A POS
Antibody Screen: NEGATIVE
Unit division: 0
Unit division: 0

## 2017-03-28 ENCOUNTER — Other Ambulatory Visit (HOSPITAL_BASED_OUTPATIENT_CLINIC_OR_DEPARTMENT_OTHER): Payer: Medicare Other

## 2017-03-28 ENCOUNTER — Encounter: Payer: Self-pay | Admitting: Internal Medicine

## 2017-03-28 ENCOUNTER — Ambulatory Visit (HOSPITAL_BASED_OUTPATIENT_CLINIC_OR_DEPARTMENT_OTHER): Payer: Medicare Other | Admitting: Internal Medicine

## 2017-03-28 ENCOUNTER — Ambulatory Visit: Payer: Medicare Other

## 2017-03-28 ENCOUNTER — Telehealth: Payer: Self-pay | Admitting: Internal Medicine

## 2017-03-28 DIAGNOSIS — C3492 Malignant neoplasm of unspecified part of left bronchus or lung: Secondary | ICD-10-CM

## 2017-03-28 DIAGNOSIS — C3402 Malignant neoplasm of left main bronchus: Secondary | ICD-10-CM

## 2017-03-28 DIAGNOSIS — C3490 Malignant neoplasm of unspecified part of unspecified bronchus or lung: Secondary | ICD-10-CM

## 2017-03-28 LAB — CBC WITH DIFFERENTIAL/PLATELET
BASO%: 0.3 % (ref 0.0–2.0)
BASOS ABS: 0 10*3/uL (ref 0.0–0.1)
EOS%: 0.3 % (ref 0.0–7.0)
Eosinophils Absolute: 0 10*3/uL (ref 0.0–0.5)
HCT: 31.5 % — ABNORMAL LOW (ref 34.8–46.6)
HEMOGLOBIN: 10 g/dL — AB (ref 11.6–15.9)
LYMPH%: 11.3 % — AB (ref 14.0–49.7)
MCH: 26.1 pg (ref 25.1–34.0)
MCHC: 31.9 g/dL (ref 31.5–36.0)
MCV: 81.8 fL (ref 79.5–101.0)
MONO#: 0.4 10*3/uL (ref 0.1–0.9)
MONO%: 10.4 % (ref 0.0–14.0)
NEUT#: 3.1 10*3/uL (ref 1.5–6.5)
NEUT%: 77.7 % — AB (ref 38.4–76.8)
Platelets: 89 10*3/uL — ABNORMAL LOW (ref 145–400)
RBC: 3.85 10*6/uL (ref 3.70–5.45)
RDW: 16.8 % — AB (ref 11.2–14.5)
WBC: 4 10*3/uL (ref 3.9–10.3)
lymph#: 0.4 10*3/uL — ABNORMAL LOW (ref 0.9–3.3)

## 2017-03-28 LAB — COMPREHENSIVE METABOLIC PANEL
ALT: 13 U/L (ref 0–55)
ANION GAP: 9 meq/L (ref 3–11)
AST: 18 U/L (ref 5–34)
Albumin: 3.5 g/dL (ref 3.5–5.0)
Alkaline Phosphatase: 64 U/L (ref 40–150)
BILIRUBIN TOTAL: 0.37 mg/dL (ref 0.20–1.20)
BUN: 24.6 mg/dL (ref 7.0–26.0)
CALCIUM: 9.9 mg/dL (ref 8.4–10.4)
CO2: 33 meq/L — AB (ref 22–29)
CREATININE: 1.1 mg/dL (ref 0.6–1.1)
Chloride: 97 mEq/L — ABNORMAL LOW (ref 98–109)
EGFR: 50 mL/min/{1.73_m2} — ABNORMAL LOW (ref >60)
Glucose: 84 mg/dl (ref 70–140)
Potassium: 4.4 mEq/L (ref 3.5–5.1)
Sodium: 140 mEq/L (ref 136–145)
TOTAL PROTEIN: 7.1 g/dL (ref 6.4–8.3)

## 2017-03-28 NOTE — Progress Notes (Signed)
[place] [phone]   [phone]  OFFICE PROGRESS NOTE  System, Pcp Not In No address on file  DIAGNOSIS: stage IV open (T3, N1, M1a) metastatic non-small cell lung cancer,squamous cell carcinoma presented with large central obstructive left hilar mass with hilar level at The patient left pleural effusion diagnosed in September 2018.  PRIOR THERAPY: Palliative radiotherapy to the left mainstem bronchus for a total dose of 20 GY in 5 fractions completed on 12/14/2016.  CURRENT THERAPY: Palliative systemic chemotherapy with carboplatin for an AUC of 5 and paclitaxel 175 mg/m every 3 weeks with Neulasta support. First dose 02/13/2017.  Status post 2 cycles.  INTERVAL HISTORY: Paula Contreras 83 y.o. female returns to the clinic today for returns to the clinic today for follow-up visit accompanied by her caregiver.  The patient is feeling fine today with no specific complaints.  She tolerated the last 2 cycles of her systemic chemotherapy fairly well.  She denied having any chest pain, shortness of breath except with exertion with no cough or hemoptysis.  She has no fever or chills.  She has no nausea, vomiting, diarrhea or constipation.  She is here today for evaluation before starting cycle #3.  MEDICAL HISTORY: Past Medical History:  Diagnosis Date  . Anemia 02/20/2015  . Aortic stenosis    a. 2D Echo 07/2013: EF 60-65%, no RWMA, grade 1 DD, normal LV filling pressure, moderate AS, mild TR, PASP 29mmHg.  . Bradycardia    a. possible bradycardia with HR 44 by physical exam notes at nephrologist office in 03/2014. F/u event monitor showed NSR PACs average HR 81, bradycardia <1% of readable data, no pauses of 3 seconds or longer..  . Cataract   . Chronic respiratory failure ([place])    a. On home O2 since 2012.  . CKD (chronic kidney disease), stage III ([place])   . COPD (chronic obstructive pulmonary disease) ([place])   . Dementia   . Hypertension   .  Osteoporosis   . Other psoriasis and similar disorders    psoriasis vs eczema  . Paroxysmal atrial fibrillation ([place])    a. Dx 07/2013. No attempts per Epic to restore NSR but in NSR 2016.  Marland Kitchen Pneumonia 03/2016  . Skin cancer     ALLERGIES:  is allergic to codeine and robitussin (alcohol free) [guaifenesin].  MEDICATIONS:  Current Outpatient Medications  Medication Sig Dispense Refill  . acetaminophen (TYLENOL) 325 MG tablet Take 650 mg by mouth every 6 (six) hours as needed (for pain).     Marland Kitchen albuterol (PROAIR HFA) 108 (90 BASE) MCG/ACT inhaler Inhale 2 puffs into the lungs every 4 (four) hours as needed for wheezing or shortness of breath. 1 Inhaler 3  . alendronate (FOSAMAX) 70 MG tablet Take 70 mg by mouth every Monday. Take with a full glass of water on an empty stomach.    . benzonatate (TESSALON) 100 MG capsule Take 1 capsule (100 mg total) by mouth 3 (three) times daily as needed for cough. 20 capsule 0  . calcium carbonate (OS-CAL) 600 MG TABS tablet Take 2 tablets by mouth daily with breakfast.     . carvedilol (COREG) 3.125 MG tablet     . Cholecalciferol (VITAMIN D) 2000 UNITS CAPS Take 2,000 Units by mouth daily with breakfast.     . esomeprazole (NEXIUM) 20 MG capsule Take 20 mg by mouth daily at 12 noon.    . feeding supplement (BOOST / RESOURCE BREEZE) LIQD Take  1 Container by mouth every evening.     . fluticasone (FLONASE) 50 MCG/ACT nasal spray Place 1 spray into both nostrils daily.  2  . hydrocortisone 2.5 % cream     . ipratropium-albuterol (DUONEB) 0.5-2.5 (3) MG/3ML SOLN     . levalbuterol (XOPENEX) 0.63 MG/3ML nebulizer solution Take 3 mLs (0.63 mg total) by nebulization every 6 (six) hours as needed for wheezing or shortness of breath.    . Multiple Vitamins-Iron (MULTIVITAMIN/IRON PO) Take 1 tablet by mouth daily.    . OXYGEN Inhale 2-4 L into the lungs continuous. To keep O2 stats >90%    . prochlorperazine (COMPAZINE) 10 MG tablet Take 1 tablet (10 mg total)  every 6 (six) hours as needed by mouth for nausea or vomiting. 30 tablet 0  . senna-docusate (SENOKOT-S) 8.6-50 MG tablet Take 1 tablet by mouth at bedtime as needed for mild constipation.    . Tiotropium Bromide Monohydrate (SPIRIVA RESPIMAT) 2.5 MCG/ACT AERS Inhale 2 puffs into the lungs daily.    [name] 20 MG TABS tablet      No current facility-administered medications for this visit.     SURGICAL HISTORY:  Past Surgical History:  Procedure Laterality Date  . BREAST SURGERY Right 1964   tumor removed  . CATARACT EXTRACTION Bilateral   . ERCP N/A 02/29/2016   Procedure: ENDOSCOPIC RETROGRADE CHOLANGIOPANCREATOGRAPHY (ERCP);  Surgeon: Irene Shipper, MD;  Location: [facility];  Service: Endoscopy;  Laterality: N/A;  . HIP SURGERY Right    fracture  . VIDEO BRONCHOSCOPY Bilateral 12/04/2016   Procedure: VIDEO BRONCHOSCOPY WITHOUT FLUORO;  Surgeon: Collene Gobble, MD;  Location: [facility] ENDOSCOPY;  Service: Cardiopulmonary;  Laterality: Bilateral;    REVIEW OF SYSTEMS:  A comprehensive review of systems was negative except for: Constitutional: positive for fatigue   PHYSICAL EXAMINATION: General appearance: alert, cooperative, fatigued and no distress Head: Normocephalic, without obvious abnormality, atraumatic Neck: no adenopathy, no JVD, supple, symmetrical, trachea midline and thyroid not enlarged, symmetric, no tenderness/mass/nodules Lymph nodes: Cervical, supraclavicular, and axillary nodes normal. Resp: clear to auscultation bilaterally Back: symmetric, no curvature. ROM normal. No CVA tenderness. Cardio: regular rate and rhythm, S1, S2 normal, no murmur, click, rub or gallop GI: soft, non-tender; bowel sounds normal; no masses,  no organomegaly Extremities: extremities normal, atraumatic, no cyanosis or edema  ECOG PERFORMANCE STATUS: 1 - Symptomatic but completely ambulatory  Blood pressure 123/75, pulse 78, temperature 98.7 F (37.1 C), temperature source Oral, resp. rate  20, height 5\' 6"  (1.676 m), weight 127 lb 6.4 oz (57.8 kg), SpO2 98 %.  LABORATORY DATA: Lab Results  Component Value Date   WBC 4.0 03/28/2017   HGB 10.0 (L) 03/28/2017   HCT 31.5 (L) 03/28/2017   MCV 81.8 03/28/2017   PLT 89 (L) 03/28/2017      Chemistry      Component Value Date/Time   NA 140 03/28/2017 0915   K 4.4 03/28/2017 0915   CL 102 12/03/2016 0913   CO2 33 (H) 03/28/2017 0915   BUN 24.6 03/28/2017 0915   CREATININE 1.1 03/28/2017 0915      Component Value Date/Time   CALCIUM 9.9 03/28/2017 0915   ALKPHOS 64 03/28/2017 0915   AST 18 03/28/2017 0915   ALT 13 03/28/2017 0915   BILITOT 0.37 03/28/2017 0915       RADIOGRAPHIC STUDIES: No results found.  ASSESSMENT AND PLAN: This is a very pleasant 83 years old white female with a stage IV non-small cell  lung cancer, squamous cell carcinoma.  She is currently undergoing systemic chemotherapy with carboplatin and paclitaxel status post 2 cycles. The patient continues to tolerate this treatment fairly well with no significant adverse effects. Her platelets count are low today. I recommended for her to delay the start of cycle number 3 by 1 week until improvement of her platelets count. I will see her back for follow-up visit in 4 weeks for evaluation after repeating CT scan of the chest, abdomen and pelvis for restaging of her disease. The patient was advised to call immediately if she has any concerning symptoms in the interval.  The patient voices understanding of current disease status and treatment options and is in agreement with the current care plan.  All questions were answered. The patient knows to call the clinic with any problems, questions or concerns. We can certainly see the patient much sooner if necessary.  I spent 10 minutes counseling the patient face to face. The total time spent in the appointment was 15 minutes.  Disclaimer: This note was dictated with voice recognition software. Similar  sounding words can inadvertently be transcribed and may not be corrected upon review.

## 2017-03-28 NOTE — Telephone Encounter (Signed)
Unable to schedule appt for treatment due to capped days - patient aware - will be given a call when appts scheduled.

## 2017-03-29 ENCOUNTER — Telehealth: Payer: Self-pay | Admitting: Internal Medicine

## 2017-03-29 NOTE — Telephone Encounter (Signed)
Spoke with Rn for Patient at [facility] - they are aware of patient next appt. And verbal understanding that whoever is with her 12/31 needs to stop by scheduling for an updated schedule.

## 2017-04-01 ENCOUNTER — Ambulatory Visit (HOSPITAL_BASED_OUTPATIENT_CLINIC_OR_DEPARTMENT_OTHER): Payer: Medicare Other

## 2017-04-01 ENCOUNTER — Other Ambulatory Visit (HOSPITAL_BASED_OUTPATIENT_CLINIC_OR_DEPARTMENT_OTHER): Payer: Medicare Other

## 2017-04-01 VITALS — BP 104/64 | HR 78 | Temp 98.6°F | Resp 18

## 2017-04-01 DIAGNOSIS — C3412 Malignant neoplasm of upper lobe, left bronchus or lung: Secondary | ICD-10-CM

## 2017-04-01 DIAGNOSIS — C3492 Malignant neoplasm of unspecified part of left bronchus or lung: Secondary | ICD-10-CM

## 2017-04-01 DIAGNOSIS — Z5111 Encounter for antineoplastic chemotherapy: Secondary | ICD-10-CM

## 2017-04-01 DIAGNOSIS — C3402 Malignant neoplasm of left main bronchus: Secondary | ICD-10-CM

## 2017-04-01 DIAGNOSIS — Z5189 Encounter for other specified aftercare: Secondary | ICD-10-CM

## 2017-04-01 LAB — COMPREHENSIVE METABOLIC PANEL
ALBUMIN: 3.4 g/dL — AB (ref 3.5–5.0)
ALK PHOS: 66 U/L (ref 40–150)
ALT: 14 U/L (ref 0–55)
ANION GAP: 7 meq/L (ref 3–11)
AST: 18 U/L (ref 5–34)
BUN: 16.3 mg/dL (ref 7.0–26.0)
CALCIUM: 9.5 mg/dL (ref 8.4–10.4)
CHLORIDE: 101 meq/L (ref 98–109)
CO2: 33 mEq/L — ABNORMAL HIGH (ref 22–29)
CREATININE: 1 mg/dL (ref 0.6–1.1)
EGFR: 56 mL/min/{1.73_m2} — ABNORMAL LOW (ref >60)
Glucose: 85 mg/dl (ref 70–140)
POTASSIUM: 4.5 meq/L (ref 3.5–5.1)
Sodium: 140 mEq/L (ref 136–145)
Total Bilirubin: 0.22 mg/dL (ref 0.20–1.20)
Total Protein: 7 g/dL (ref 6.4–8.3)

## 2017-04-01 LAB — CBC WITH DIFFERENTIAL/PLATELET
BASO%: 0.9 % (ref 0.0–2.0)
Basophils Absolute: 0 10*3/uL (ref 0.0–0.1)
EOS ABS: 0 10*3/uL (ref 0.0–0.5)
EOS%: 1 % (ref 0.0–7.0)
HCT: 32.5 % — ABNORMAL LOW (ref 34.8–46.6)
HEMOGLOBIN: 10.4 g/dL — AB (ref 11.6–15.9)
LYMPH%: 15.2 % (ref 14.0–49.7)
MCH: 26.6 pg (ref 25.1–34.0)
MCHC: 31.9 g/dL (ref 31.5–36.0)
MCV: 83.5 fL (ref 79.5–101.0)
MONO#: 0.5 10*3/uL (ref 0.1–0.9)
MONO%: 13.3 % (ref 0.0–14.0)
NEUT%: 69.6 % (ref 38.4–76.8)
NEUTROS ABS: 2.7 10*3/uL (ref 1.5–6.5)
Platelets: 214 10*3/uL (ref 145–400)
RBC: 3.89 10*6/uL (ref 3.70–5.45)
RDW: 16.8 % — AB (ref 11.2–14.5)
WBC: 3.8 10*3/uL — AB (ref 3.9–10.3)
lymph#: 0.6 10*3/uL — ABNORMAL LOW (ref 0.9–3.3)

## 2017-04-01 MED ORDER — DIPHENHYDRAMINE HCL 50 MG/ML IJ SOLN
50.0000 mg | Freq: Once | INTRAMUSCULAR | Status: AC
  Administered 2017-04-01: 50 mg via INTRAVENOUS

## 2017-04-01 MED ORDER — SODIUM CHLORIDE 0.9 % IV SOLN
319.0000 mg | Freq: Once | INTRAVENOUS | Status: AC
  Administered 2017-04-01: 320 mg via INTRAVENOUS
  Filled 2017-04-01: qty 32

## 2017-04-01 MED ORDER — PEGFILGRASTIM 6 MG/0.6ML ~~LOC~~ PSKT
PREFILLED_SYRINGE | SUBCUTANEOUS | Status: AC
  Filled 2017-04-01: qty 0.6

## 2017-04-01 MED ORDER — SODIUM CHLORIDE 0.9 % IV SOLN
175.0000 mg/m2 | Freq: Once | INTRAVENOUS | Status: AC
  Administered 2017-04-01: 294 mg via INTRAVENOUS
  Filled 2017-04-01: qty 49

## 2017-04-01 MED ORDER — SODIUM CHLORIDE 0.9 % IV SOLN
Freq: Once | INTRAVENOUS | Status: AC
  Administered 2017-04-01: 10:00:00 via INTRAVENOUS

## 2017-04-01 MED ORDER — FAMOTIDINE IN NACL 20-0.9 MG/50ML-% IV SOLN
INTRAVENOUS | Status: AC
  Filled 2017-04-01: qty 50

## 2017-04-01 MED ORDER — DIPHENHYDRAMINE HCL 50 MG/ML IJ SOLN
INTRAMUSCULAR | Status: AC
  Filled 2017-04-01: qty 1

## 2017-04-01 MED ORDER — PALONOSETRON HCL INJECTION 0.25 MG/5ML
0.2500 mg | Freq: Once | INTRAVENOUS | Status: AC
  Administered 2017-04-01: 0.25 mg via INTRAVENOUS

## 2017-04-01 MED ORDER — FAMOTIDINE IN NACL 20-0.9 MG/50ML-% IV SOLN
40.0000 mg | Freq: Once | INTRAVENOUS | Status: AC
  Administered 2017-04-01: 40 mg via INTRAVENOUS

## 2017-04-01 MED ORDER — PALONOSETRON HCL INJECTION 0.25 MG/5ML
INTRAVENOUS | Status: AC
  Filled 2017-04-01: qty 5

## 2017-04-01 MED ORDER — PEGFILGRASTIM 6 MG/0.6ML ~~LOC~~ PSKT
6.0000 mg | PREFILLED_SYRINGE | Freq: Once | SUBCUTANEOUS | Status: AC
  Administered 2017-04-01: 6 mg via SUBCUTANEOUS

## 2017-04-01 MED ORDER — SODIUM CHLORIDE 0.9 % IV SOLN
20.0000 mg | Freq: Once | INTRAVENOUS | Status: AC
  Administered 2017-04-01: 20 mg via INTRAVENOUS
  Filled 2017-04-01: qty 2

## 2017-04-01 NOTE — Patient Instructions (Signed)
   [facility] Discharge Instructions for Patients Receiving Chemotherapy  Today you received the following chemotherapy agents Taxol and Carboplatin   To help prevent nausea and vomiting after your treatment, we encourage you to take your nausea medication as directed.    If you develop nausea and vomiting that is not controlled by your nausea medication, call the clinic.   BELOW ARE SYMPTOMS THAT SHOULD BE REPORTED IMMEDIATELY:  *FEVER GREATER THAN 100.5 F  *CHILLS WITH OR WITHOUT FEVER  NAUSEA AND VOMITING THAT IS NOT CONTROLLED WITH YOUR NAUSEA MEDICATION  *UNUSUAL SHORTNESS OF BREATH  *UNUSUAL BRUISING OR BLEEDING  TENDERNESS IN MOUTH AND THROAT WITH OR WITHOUT PRESENCE OF ULCERS  *URINARY PROBLEMS  *BOWEL PROBLEMS  UNUSUAL RASH Items with * indicate a potential emergency and should be followed up as soon as possible.  Feel free to call the clinic should you have any questions or concerns. The clinic phone number is [phone].  Please show the CHEMO ALERT CARD at check-in to the Emergency Department and triage nurse.   

## 2017-04-01 NOTE — Progress Notes (Signed)
1424: pt noted to have removed rt PIV, small amount of Taxol approx 5cc noted on blanket. Chemo spill cleaned per protocol and arm cleaned with warm water and soap no reddness or swelling noted. Diane RN aware whom will notify Dr. Julien Nordmann. New PIV started.  Discharge instructions printed and [place] [facility] printed instructions given to pt, nurse note sent with discharge instructions to monitor rt PIV site, also Prescription from [place] sent with pt and driver; Iona Beard. Pt and Driver [employer] understanding. Pt stable at discharge.

## 2017-04-03 ENCOUNTER — Other Ambulatory Visit: Payer: Medicare Other

## 2017-04-08 ENCOUNTER — Other Ambulatory Visit: Payer: Medicare Other

## 2017-04-10 ENCOUNTER — Other Ambulatory Visit: Payer: Medicare Other

## 2017-04-15 ENCOUNTER — Telehealth: Payer: Self-pay | Admitting: Medical Oncology

## 2017-04-15 ENCOUNTER — Other Ambulatory Visit: Payer: Medicare Other

## 2017-04-15 ENCOUNTER — Encounter: Payer: Self-pay | Admitting: Medical Oncology

## 2017-04-15 NOTE — Telephone Encounter (Signed)
Nursing home staff called to report platelet count of 30 k. I called back to review bleeding precautions with the nurse but had to leave a message to return my call.

## 2017-04-15 NOTE — Telephone Encounter (Signed)
I spoke to Adena at [place] and todl her to start Bleeding precautions .

## 2017-04-17 ENCOUNTER — Ambulatory Visit: Payer: Medicare Other

## 2017-04-17 ENCOUNTER — Ambulatory Visit (HOSPITAL_COMMUNITY): Payer: Medicare Other

## 2017-04-17 ENCOUNTER — Telehealth: Payer: Self-pay | Admitting: Internal Medicine

## 2017-04-17 ENCOUNTER — Ambulatory Visit: Payer: Medicare Other | Admitting: Internal Medicine

## 2017-04-17 ENCOUNTER — Other Ambulatory Visit: Payer: Medicare Other

## 2017-04-17 ENCOUNTER — Other Ambulatory Visit: Payer: Self-pay | Admitting: Medical Oncology

## 2017-04-17 DIAGNOSIS — C3492 Malignant neoplasm of unspecified part of left bronchus or lung: Secondary | ICD-10-CM

## 2017-04-17 NOTE — Telephone Encounter (Signed)
Spoke with Paula Contreras at [facility] - She is aware of new appt date and time per 1/16 sch message.

## 2017-04-22 ENCOUNTER — Other Ambulatory Visit: Payer: Medicare Other

## 2017-04-22 ENCOUNTER — Ambulatory Visit: Payer: Medicare Other

## 2017-04-22 ENCOUNTER — Ambulatory Visit: Payer: Medicare Other | Admitting: Oncology

## 2017-04-23 ENCOUNTER — Ambulatory Visit (HOSPITAL_COMMUNITY)
Admission: RE | Admit: 2017-04-23 | Discharge: 2017-04-23 | Disposition: A | Discharge status: Home / Self Care | Payer: Medicare Other | Source: Ambulatory Visit | Attending: Internal Medicine | Admitting: Internal Medicine

## 2017-04-23 DIAGNOSIS — J439 Emphysema, unspecified: Secondary | ICD-10-CM | POA: Insufficient documentation

## 2017-04-23 DIAGNOSIS — J90 Pleural effusion, not elsewhere classified: Secondary | ICD-10-CM | POA: Insufficient documentation

## 2017-04-23 DIAGNOSIS — K449 Diaphragmatic hernia without obstruction or gangrene: Secondary | ICD-10-CM | POA: Insufficient documentation

## 2017-04-23 DIAGNOSIS — I700 Atherosclerosis of aorta: Secondary | ICD-10-CM | POA: Insufficient documentation

## 2017-04-23 DIAGNOSIS — C3490 Malignant neoplasm of unspecified part of unspecified bronchus or lung: Secondary | ICD-10-CM | POA: Insufficient documentation

## 2017-04-23 MED ORDER — IOPAMIDOL (ISOVUE-300) INJECTION 61%
100.0000 mL | Freq: Once | INTRAVENOUS | Status: AC | PRN
  Administered 2017-04-23: 100 mL via INTRAVENOUS

## 2017-04-23 MED ORDER — IOPAMIDOL (ISOVUE-300) INJECTION 61%
INTRAVENOUS | Status: AC
  Filled 2017-04-23: qty 100

## 2017-04-24 ENCOUNTER — Telehealth: Payer: Self-pay | Admitting: Oncology

## 2017-04-24 ENCOUNTER — Other Ambulatory Visit: Payer: Self-pay

## 2017-04-24 ENCOUNTER — Telehealth: Payer: Self-pay | Admitting: Internal Medicine

## 2017-04-24 ENCOUNTER — Inpatient Hospital Stay: Payer: Medicare Other

## 2017-04-24 ENCOUNTER — Inpatient Hospital Stay (HOSPITAL_BASED_OUTPATIENT_CLINIC_OR_DEPARTMENT_OTHER): Payer: Medicare Other | Admitting: Oncology

## 2017-04-24 ENCOUNTER — Encounter: Payer: Self-pay | Admitting: Oncology

## 2017-04-24 ENCOUNTER — Inpatient Hospital Stay: Payer: Medicare Other | Attending: Internal Medicine

## 2017-04-24 VITALS — BP 119/70 | HR 91 | Temp 98.4°F | Resp 18 | Ht 66.0 in | Wt 129.1 lb

## 2017-04-24 DIAGNOSIS — Z5111 Encounter for antineoplastic chemotherapy: Secondary | ICD-10-CM

## 2017-04-24 DIAGNOSIS — D649 Anemia, unspecified: Secondary | ICD-10-CM

## 2017-04-24 DIAGNOSIS — C3402 Malignant neoplasm of left main bronchus: Secondary | ICD-10-CM | POA: Diagnosis not present

## 2017-04-24 DIAGNOSIS — C3412 Malignant neoplasm of upper lobe, left bronchus or lung: Secondary | ICD-10-CM

## 2017-04-24 DIAGNOSIS — J90 Pleural effusion, not elsewhere classified: Secondary | ICD-10-CM | POA: Insufficient documentation

## 2017-04-24 DIAGNOSIS — Z5189 Encounter for other specified aftercare: Secondary | ICD-10-CM | POA: Diagnosis not present

## 2017-04-24 DIAGNOSIS — C3492 Malignant neoplasm of unspecified part of left bronchus or lung: Secondary | ICD-10-CM

## 2017-04-24 LAB — COMPREHENSIVE METABOLIC PANEL
ALK PHOS: 64 U/L (ref 40–150)
ALT: 11 U/L (ref 0–55)
AST: 16 U/L (ref 5–34)
Albumin: 3.3 g/dL — ABNORMAL LOW (ref 3.5–5.0)
Anion gap: 7 (ref 3–11)
BILIRUBIN TOTAL: 0.2 mg/dL (ref 0.2–1.2)
BUN: 18 mg/dL (ref 7–26)
CALCIUM: 10.1 mg/dL (ref 8.4–10.4)
CHLORIDE: 95 mmol/L — AB (ref 98–109)
CO2: 34 mmol/L — ABNORMAL HIGH (ref 22–29)
CREATININE: 1.1 mg/dL (ref 0.60–1.10)
GFR calc Af Amer: 53 mL/min — ABNORMAL LOW (ref >60)
GFR, EST NON AFRICAN AMERICAN: 46 mL/min — AB (ref >60)
Glucose, Bld: 90 mg/dL (ref 70–140)
Potassium: 4.6 mmol/L (ref 3.3–4.7)
Sodium: 136 mmol/L (ref 136–145)
TOTAL PROTEIN: 6.8 g/dL (ref 6.4–8.3)

## 2017-04-24 LAB — CBC WITH DIFFERENTIAL/PLATELET
BASOS ABS: 0 10*3/uL (ref 0.0–0.1)
Basophils Relative: 0 %
Eosinophils Absolute: 0 10*3/uL (ref 0.0–0.5)
Eosinophils Relative: 1 %
HEMATOCRIT: 26.3 % — AB (ref 34.8–46.6)
HEMOGLOBIN: 8 g/dL — AB (ref 11.6–15.9)
LYMPHS ABS: 0.8 10*3/uL — AB (ref 0.9–3.3)
LYMPHS PCT: 12 %
MCH: 27.8 pg (ref 25.1–34.0)
MCHC: 30.4 g/dL — ABNORMAL LOW (ref 31.5–36.0)
MCV: 91.3 fL (ref 79.5–101.0)
Monocytes Absolute: 0.7 10*3/uL (ref 0.1–0.9)
Monocytes Relative: 10 %
NEUTROS ABS: 5.1 10*3/uL (ref 1.5–6.5)
Neutrophils Relative %: 77 %
Platelets: 136 10*3/uL — ABNORMAL LOW (ref 145–400)
RBC: 2.88 MIL/uL — ABNORMAL LOW (ref 3.70–5.45)
RDW: 22 % — ABNORMAL HIGH (ref 11.2–16.1)
WBC: 6.6 10*3/uL (ref 3.9–10.3)

## 2017-04-24 LAB — ABO/RH: ABO/RH(D): A POS

## 2017-04-24 MED ORDER — PEGFILGRASTIM 6 MG/0.6ML ~~LOC~~ PSKT
PREFILLED_SYRINGE | SUBCUTANEOUS | Status: AC
  Filled 2017-04-24: qty 0.6

## 2017-04-24 MED ORDER — SODIUM CHLORIDE 0.9 % IV SOLN
20.0000 mg | Freq: Once | INTRAVENOUS | Status: AC
  Administered 2017-04-24: 20 mg via INTRAVENOUS
  Filled 2017-04-24: qty 2

## 2017-04-24 MED ORDER — PALONOSETRON HCL INJECTION 0.25 MG/5ML
INTRAVENOUS | Status: AC
  Filled 2017-04-24: qty 5

## 2017-04-24 MED ORDER — DIPHENHYDRAMINE HCL 50 MG/ML IJ SOLN
INTRAMUSCULAR | Status: AC
  Filled 2017-04-24: qty 1

## 2017-04-24 MED ORDER — SODIUM CHLORIDE 0.9 % IV SOLN
175.0000 mg/m2 | Freq: Once | INTRAVENOUS | Status: AC
  Administered 2017-04-24: 294 mg via INTRAVENOUS
  Filled 2017-04-24: qty 49

## 2017-04-24 MED ORDER — PEGFILGRASTIM 6 MG/0.6ML ~~LOC~~ PSKT
6.0000 mg | PREFILLED_SYRINGE | Freq: Once | SUBCUTANEOUS | Status: AC
  Administered 2017-04-24: 6 mg via SUBCUTANEOUS

## 2017-04-24 MED ORDER — FAMOTIDINE IN NACL 20-0.9 MG/50ML-% IV SOLN
INTRAVENOUS | Status: AC
  Filled 2017-04-24: qty 50

## 2017-04-24 MED ORDER — PALONOSETRON HCL INJECTION 0.25 MG/5ML
0.2500 mg | Freq: Once | INTRAVENOUS | Status: AC
  Administered 2017-04-24: 0.25 mg via INTRAVENOUS

## 2017-04-24 MED ORDER — DIPHENHYDRAMINE HCL 50 MG/ML IJ SOLN
50.0000 mg | Freq: Once | INTRAMUSCULAR | Status: AC
  Administered 2017-04-24: 50 mg via INTRAVENOUS

## 2017-04-24 MED ORDER — SODIUM CHLORIDE 0.9 % IV SOLN
319.0000 mg | Freq: Once | INTRAVENOUS | Status: AC
  Administered 2017-04-24: 320 mg via INTRAVENOUS
  Filled 2017-04-24: qty 32

## 2017-04-24 MED ORDER — FAMOTIDINE IN NACL 20-0.9 MG/50ML-% IV SOLN
40.0000 mg | Freq: Once | INTRAVENOUS | Status: AC
  Administered 2017-04-24: 40 mg via INTRAVENOUS

## 2017-04-24 MED ORDER — SODIUM CHLORIDE 0.9 % IV SOLN
Freq: Once | INTRAVENOUS | Status: AC
  Administered 2017-04-24: 12:00:00 via INTRAVENOUS

## 2017-04-24 NOTE — Telephone Encounter (Signed)
Gave patient updated avs and calendars in infusion - called patients facility and informed them of her appointments.

## 2017-04-24 NOTE — Patient Instructions (Signed)
   [facility] Discharge Instructions for Patients Receiving Chemotherapy  Today you received the following chemotherapy agents Taxol and Carboplatin   To help prevent nausea and vomiting after your treatment, we encourage you to take your nausea medication as directed.    If you develop nausea and vomiting that is not controlled by your nausea medication, call the clinic.   BELOW ARE SYMPTOMS THAT SHOULD BE REPORTED IMMEDIATELY:  *FEVER GREATER THAN 100.5 F  *CHILLS WITH OR WITHOUT FEVER  NAUSEA AND VOMITING THAT IS NOT CONTROLLED WITH YOUR NAUSEA MEDICATION  *UNUSUAL SHORTNESS OF BREATH  *UNUSUAL BRUISING OR BLEEDING  TENDERNESS IN MOUTH AND THROAT WITH OR WITHOUT PRESENCE OF ULCERS  *URINARY PROBLEMS  *BOWEL PROBLEMS  UNUSUAL RASH Items with * indicate a potential emergency and should be followed up as soon as possible.  Feel free to call the clinic should you have any questions or concerns. The clinic phone number is [phone].  Please show the CHEMO ALERT CARD at check-in to the Emergency Department and triage nurse.   

## 2017-04-24 NOTE — Progress Notes (Signed)
[facility] OFFICE PROGRESS NOTE  System, Pcp Not In No address on file  DIAGNOSIS: stage IV open (T3, N1, M1a) metastatic non-small cell lung cancer,squamous cell carcinoma presented with large central obstructive left hilar mass with hilar level at The patient left pleural effusion diagnosed in September 2018.  PRIOR THERAPY: Palliative radiotherapy to the left mainstem bronchus for a total dose of 20 GY in 5 fractions completed on 12/14/2016.  CURRENT THERAPY: Palliative systemic chemotherapy with carboplatin for an AUC of 5 and paclitaxel 175 mg/m every 3 weeks with Neulasta support. First dose 02/13/2017.  Status post 3 cycles.  INTERVAL HISTORY: Paula Contreras 84 y.o. female returns for routine follow-up visit by herself.  The patient has no specific complaints.  She tolerated her last cycle of chemotherapy fairly well.  The patient denies fevers and chills.  Denies chest pain, shortness of breath at rest, cough, hemoptysis.  She has her baseline shortness of breath with exertion and wears home oxygen.  Denies nausea, vomiting, constipation, diarrhea.  The patient is here for evaluation prior to starting cycle 4 of her treatment and to review her recent restaging CT scan results.  MEDICAL HISTORY: Past Medical History:  Diagnosis Date  . Anemia 02/20/2015  . Aortic stenosis    a. 2D Echo 07/2013: EF 60-65%, no RWMA, grade 1 DD, normal LV filling pressure, moderate AS, mild TR, PASP 64mHg.  . Bradycardia    a. possible bradycardia with HR 44 by physical exam notes at nephrologist office in 03/2014. F/u event monitor showed NSR PACs average HR 81, bradycardia <1% of readable data, no pauses of 3 seconds or longer..  . Cataract   . Chronic respiratory failure ([facility]    a. On home O2 since 2012.  . CKD (chronic kidney disease), stage III ([place]   . COPD (chronic obstructive pulmonary disease) ([place]   . Dementia   . Hypertension   . Osteoporosis   . Other psoriasis and  similar disorders    psoriasis vs eczema  . Paroxysmal atrial fibrillation ([facility]    a. Dx 07/2013. No attempts per Epic to restore NSR but in NSR 2016.  .Marland KitchenPneumonia 03/2016  . Skin cancer     ALLERGIES:  is allergic to codeine and robitussin (alcohol free) [guaifenesin].  MEDICATIONS:  Current Outpatient Medications  Medication Sig Dispense Refill  . acetaminophen (TYLENOL) 325 MG tablet Take 650 mg by mouth every 6 (six) hours as needed (for pain).     .Marland Kitchenalbuterol (PROAIR HFA) 108 (90 BASE) MCG/ACT inhaler Inhale 2 puffs into the lungs every 4 (four) hours as needed for wheezing or shortness of breath. 1 Inhaler 3  . alendronate (FOSAMAX) 70 MG tablet Take 70 mg by mouth every Monday. Take with a full glass of water on an empty stomach.    . benzonatate (TESSALON) 100 MG capsule Take 1 capsule (100 mg total) by mouth 3 (three) times daily as needed for cough. 20 capsule 0  . calcium carbonate (OS-CAL) 600 MG TABS tablet Take 2 tablets by mouth daily with breakfast.     . carvedilol (COREG) 3.125 MG tablet     . Cholecalciferol (VITAMIN D) 2000 UNITS CAPS Take 2,000 Units by mouth daily with breakfast.     . esomeprazole (NEXIUM) 20 MG capsule Take 20 mg by mouth daily at 12 noon.    . feeding supplement (BOOST / RESOURCE BREEZE) LIQD Take 1 Container by mouth every evening.     . fluticasone (  FLONASE) 50 MCG/ACT nasal spray Place 1 spray into both nostrils daily.  2  . hydrocortisone 2.5 % cream     . ipratropium-albuterol (DUONEB) 0.5-2.5 (3) MG/3ML SOLN     . levalbuterol (XOPENEX) 0.63 MG/3ML nebulizer solution Take 3 mLs (0.63 mg total) by nebulization every 6 (six) hours as needed for wheezing or shortness of breath.    . Multiple Vitamins-Iron (MULTIVITAMIN/IRON PO) Take 1 tablet by mouth daily.    . OXYGEN Inhale 2-4 L into the lungs continuous. To keep O2 stats >90%    . prochlorperazine (COMPAZINE) 10 MG tablet Take 1 tablet (10 mg total) every 6 (six) hours as needed by mouth  for nausea or vomiting. 30 tablet 0  . senna-docusate (SENOKOT-S) 8.6-50 MG tablet Take 1 tablet by mouth at bedtime as needed for mild constipation.    . Tiotropium Bromide Monohydrate (SPIRIVA RESPIMAT) 2.5 MCG/ACT AERS Inhale 2 puffs into the lungs daily.    [name] 20 MG TABS tablet      No current facility-administered medications for this visit.    Facility-Administered Medications Ordered in Other Visits  Medication Dose Route Frequency Provider Last Rate Last Dose  . CARBOplatin (PARAPLATIN) 320 mg in sodium chloride 0.9 % 250 mL chemo infusion  320 mg Intravenous Once Curt Bears, MD      . PACLitaxel (TAXOL) 294 mg in sodium chloride 0.9 % 250 mL chemo infusion (> 67m/m2)  175 mg/m2 (Treatment Plan Recorded) Intravenous Once MCurt Bears MD 100 mL/hr at 04/24/17 1355 294 mg at 04/24/17 1355  . pegfilgrastim (NEULASTA ONPRO KIT) injection 6 mg  6 mg Subcutaneous Once MCurt Bears MD        SURGICAL HISTORY:  Past Surgical History:  Procedure Laterality Date  . BREAST SURGERY Right 1964   tumor removed  . CATARACT EXTRACTION Bilateral   . ERCP N/A 02/29/2016   Procedure: ENDOSCOPIC RETROGRADE CHOLANGIOPANCREATOGRAPHY (ERCP);  Surgeon: JIrene Shipper MD;  [facility];  Service: Endoscopy;  Laterality: N/A;  . HIP SURGERY Right    fracture  . VIDEO BRONCHOSCOPY Bilateral 12/04/2016   Procedure: VIDEO BRONCHOSCOPY WITHOUT FLUORO;  Surgeon: BCollene Gobble MD;  [facility];  Service: Cardiopulmonary;  Laterality: Bilateral;    REVIEW OF SYSTEMS:   Review of Systems  Constitutional: Negative for appetite change, chills, fatigue, fever and unexpected weight change.  HENT:   Negative for mouth sores, nosebleeds, sore throat and trouble swallowing.   Eyes: Negative for eye problems and icterus.  Respiratory: Negative for cough, hemoptysis, shortness of breath and wheezing.   Cardiovascular: Negative for chest pain and leg swelling.   Gastrointestinal: Negative for abdominal pain, constipation, diarrhea, nausea and vomiting.  Genitourinary: Negative for bladder incontinence, difficulty urinating, dysuria, frequency and hematuria.   Musculoskeletal: Negative for back pain, gait problem, neck pain and neck stiffness.  Skin: Negative for itching and rash.  Neurological: Negative for dizziness, extremity weakness, gait problem, headaches, light-headedness and seizures.  Hematological: Negative for adenopathy. Does not bruise/bleed easily.  Psychiatric/Behavioral: Negative for confusion, depression and sleep disturbance. The patient is not nervous/anxious.     PHYSICAL EXAMINATION:  Blood pressure 119/70, pulse 91, temperature 98.4 F (36.9 C), temperature source Oral, resp. rate 18, height 5' 6"  (1.676 m), weight 129 lb 1.6 oz (58.6 kg), SpO2 99 %.  ECOG PERFORMANCE STATUS: 1 - Symptomatic but completely ambulatory  Physical Exam  Constitutional: Oriented to person and place. No distress.  HENT:  Head: Normocephalic and atraumatic.  Mouth/Throat: Oropharynx is clear and moist. No oropharyngeal exudate.  Eyes: Conjunctivae are normal. Right eye exhibits no discharge. Left eye exhibits no discharge. No scleral icterus.  Neck: Normal range of motion. Neck supple.  Cardiovascular: Normal rate, regular rhythm, normal heart sounds and intact distal pulses.   Pulmonary/Chest: Effort normal and breath sounds normal. No respiratory distress. No wheezes. No rales.  Abdominal: Soft. Bowel sounds are normal. Exhibits no distension and no mass. There is no tenderness.  Musculoskeletal: Normal range of motion. Exhibits no edema.  Lymphadenopathy:    No cervical adenopathy.  Neurological: Alert and oriented to person, place, and time. Exhibits normal muscle tone. Coordination normal.  Skin: Skin is warm and dry. No rash noted. Not diaphoretic. No erythema. No pallor.  Psychiatric: Mood normal.  Vitals reviewed.  LABORATORY  DATA: Lab Results  Component Value Date   WBC 6.6 04/24/2017   HGB 8.0 (L) 04/24/2017   HCT 26.3 (L) 04/24/2017   MCV 91.3 04/24/2017   PLT 136 (L) 04/24/2017      Chemistry      Component Value Date/Time   NA 136 04/24/2017 0952   NA 140 04/01/2017 0738   K 4.6 04/24/2017 0952   K 4.5 04/01/2017 0738   CL 95 (L) 04/24/2017 0952   CO2 34 (H) 04/24/2017 0952   CO2 33 (H) 04/01/2017 0738   BUN 18 04/24/2017 0952   BUN 16.3 04/01/2017 0738   CREATININE 1.10 04/24/2017 0952   CREATININE 1.0 04/01/2017 0738      Component Value Date/Time   CALCIUM 10.1 04/24/2017 0952   CALCIUM 9.5 04/01/2017 0738   ALKPHOS 64 04/24/2017 0952   ALKPHOS 66 04/01/2017 0738   AST 16 04/24/2017 0952   AST 18 04/01/2017 0738   ALT 11 04/24/2017 0952   ALT 14 04/01/2017 0738   BILITOT 0.2 04/24/2017 0952   BILITOT 0.22 04/01/2017 0738       RADIOGRAPHIC STUDIES:  Ct Chest W Contrast  Result Date: 04/23/2017 CLINICAL DATA:  Non-small-cell lung cancer. EXAM: CT CHEST, ABDOMEN, AND PELVIS WITH CONTRAST TECHNIQUE: Multidetector CT imaging of the chest, abdomen and pelvis was performed following the standard protocol during bolus administration of intravenous contrast. CONTRAST:  175m ISOVUE-300 IOPAMIDOL (ISOVUE-300) INJECTION 61% COMPARISON:  PET-CT 01/28/2017. Chest CT 01/15/2017. Abdomen and pelvis CT 12/03/2016. FINDINGS: CT CHEST FINDINGS Cardiovascular: The heart size is normal. No pericardial effusion. Coronary artery calcification is evident. Atherosclerotic calcification is noted in the wall of the thoracic aorta. Mediastinum/Nodes: No mediastinal lymphadenopathy. Abnormal soft tissue in the left upper hilum is similar to prior PET-CT. Mass lesion identified on previous PET-CT not readily evident on today's CT scan given the adjacent left upper lobe collapse. Esophagus is diffusely fluid filled proximal to a moderate hiatal hernia which may be related to reflux or dysmotility. There is no  axillary lymphadenopathy. Lungs/Pleura: Similar appearance marked volume loss left hemithorax right lung hyperexpanded. Emphysema noted bilaterally. Small left pleural effusion evident. Musculoskeletal: Bones are diffusely demineralized. T10 compression fracture again noted. CT ABDOMEN PELVIS FINDINGS Hepatobiliary: Small area of low attenuation in the anterior liver, adjacent to the falciform ligament, is in a characteristic location for focal fatty deposition. Pneumobilia is compatible with prior sphincterotomy. Gallbladder surgically absent. Pancreas: No focal mass lesion. No dilatation of the main duct. No intraparenchymal cyst. No peripancreatic edema. Spleen: No splenomegaly. No focal mass lesion. Adrenals/Urinary Tract: Stable 7 mm left adrenal nodule. Right adrenal gland unremarkable. No suspicious enhancing lesion in either kidney. No  hydronephrosis. No evidence for hydroureter. The urinary bladder appears normal for the degree of distention. Stomach/Bowel: Moderate hiatal hernia. Stomach otherwise unremarkable. Duodenum is normally positioned as is the ligament of Treitz. No small bowel wall thickening. No small bowel dilatation. The terminal ileum is normal. The appendix is not visualized, but there is no edema or inflammation in the region of the cecum. No gross colonic mass. No colonic wall thickening. No substantial diverticular change. Vascular/Lymphatic: There is abdominal aortic atherosclerosis without aneurysm. There is no gastrohepatic or hepatoduodenal ligament lymphadenopathy. No intraperitoneal or retroperitoneal lymphadenopathy. No pelvic sidewall lymphadenopathy. Reproductive: The uterus has normal CT imaging appearance. Interval progression of right adnexal cystic mass now measuring 7.7 cm compared to 6.8 cm on the CT from 12/03/2016. This lesion showed no hypermetabolism on the previous PET-CT. Other: No intraperitoneal free fluid. Musculoskeletal: Diffuse bony demineralization. Status post  screw placement in the right femoral neck. Bone windows reveal no worrisome lytic or sclerotic osseous lesions. Compression deformity at L1, L4 and L5 is similar to prior. IMPRESSION: 1. Similar appearance to left upper lobe collapse with abnormal soft tissue in the left hilum when comparing to previous PET-CT. No evidence for mediastinal lymphadenopathy on the current study. 2. Persistent small left pleural effusion. 3. Stable 7 mm left adrenal nodule. 4. Slight interval progression of the large right adnexal cyst without hypermetabolism on the recent study. Benign neoplasm could have this appearance. 5. Compression deformity at T10, L1, L4, and L5, similar to prior. 6. Moderate hiatal hernia. 7.  Aortic Atherosclerois (ICD10-170.0) 8.  Emphysema. (SFK81-E75.9) Electronically Signed   By: Misty Stanley M.D.   On: 04/23/2017 13:47   Ct Abdomen Pelvis W Contrast  Result Date: 04/23/2017 CLINICAL DATA:  Non-small-cell lung cancer. EXAM: CT CHEST, ABDOMEN, AND PELVIS WITH CONTRAST TECHNIQUE: Multidetector CT imaging of the chest, abdomen and pelvis was performed following the standard protocol during bolus administration of intravenous contrast. CONTRAST:  133m ISOVUE-300 IOPAMIDOL (ISOVUE-300) INJECTION 61% COMPARISON:  PET-CT 01/28/2017. Chest CT 01/15/2017. Abdomen and pelvis CT 12/03/2016. FINDINGS: CT CHEST FINDINGS Cardiovascular: The heart size is normal. No pericardial effusion. Coronary artery calcification is evident. Atherosclerotic calcification is noted in the wall of the thoracic aorta. Mediastinum/Nodes: No mediastinal lymphadenopathy. Abnormal soft tissue in the left upper hilum is similar to prior PET-CT. Mass lesion identified on previous PET-CT not readily evident on today's CT scan given the adjacent left upper lobe collapse. Esophagus is diffusely fluid filled proximal to a moderate hiatal hernia which may be related to reflux or dysmotility. There is no axillary lymphadenopathy. Lungs/Pleura:  Similar appearance marked volume loss left hemithorax right lung hyperexpanded. Emphysema noted bilaterally. Small left pleural effusion evident. Musculoskeletal: Bones are diffusely demineralized. T10 compression fracture again noted. CT ABDOMEN PELVIS FINDINGS Hepatobiliary: Small area of low attenuation in the anterior liver, adjacent to the falciform ligament, is in a characteristic location for focal fatty deposition. Pneumobilia is compatible with prior sphincterotomy. Gallbladder surgically absent. Pancreas: No focal mass lesion. No dilatation of the main duct. No intraparenchymal cyst. No peripancreatic edema. Spleen: No splenomegaly. No focal mass lesion. Adrenals/Urinary Tract: Stable 7 mm left adrenal nodule. Right adrenal gland unremarkable. No suspicious enhancing lesion in either kidney. No hydronephrosis. No evidence for hydroureter. The urinary bladder appears normal for the degree of distention. Stomach/Bowel: Moderate hiatal hernia. Stomach otherwise unremarkable. Duodenum is normally positioned as is the ligament of Treitz. No small bowel wall thickening. No small bowel dilatation. The terminal ileum is normal. The appendix is  not visualized, but there is no edema or inflammation in the region of the cecum. No gross colonic mass. No colonic wall thickening. No substantial diverticular change. Vascular/Lymphatic: There is abdominal aortic atherosclerosis without aneurysm. There is no gastrohepatic or hepatoduodenal ligament lymphadenopathy. No intraperitoneal or retroperitoneal lymphadenopathy. No pelvic sidewall lymphadenopathy. Reproductive: The uterus has normal CT imaging appearance. Interval progression of right adnexal cystic mass now measuring 7.7 cm compared to 6.8 cm on the CT from 12/03/2016. This lesion showed no hypermetabolism on the previous PET-CT. Other: No intraperitoneal free fluid. Musculoskeletal: Diffuse bony demineralization. Status post screw placement in the right femoral  neck. Bone windows reveal no worrisome lytic or sclerotic osseous lesions. Compression deformity at L1, L4 and L5 is similar to prior. IMPRESSION: 1. Similar appearance to left upper lobe collapse with abnormal soft tissue in the left hilum when comparing to previous PET-CT. No evidence for mediastinal lymphadenopathy on the current study. 2. Persistent small left pleural effusion. 3. Stable 7 mm left adrenal nodule. 4. Slight interval progression of the large right adnexal cyst without hypermetabolism on the recent study. Benign neoplasm could have this appearance. 5. Compression deformity at T10, L1, L4, and L5, similar to prior. 6. Moderate hiatal hernia. 7.  Aortic Atherosclerois (ICD10-170.0) 8.  Emphysema. (IRS85-I62.9) Electronically Signed   By: Misty Stanley M.D.   On: 04/23/2017 13:47     ASSESSMENT/PLAN:  Malignant neoplasm of left lung [facility]) This is a very pleasant 84 year old white female with a stage IV non-small cell lung cancer, squamous cell carcinoma.  She is currently undergoing systemic chemotherapy with carboplatin and paclitaxel status post 3 cycles. The patient continues to tolerate this treatment fairly well with no significant adverse effects.  The patient was seen with Dr. Julien Nordmann.  CT scan results were reviewed with the patient which showed no evidence of progressive disease.  Recommend that she proceed with cycle 4 of her chemotherapy as scheduled today.  The patient has a hemoglobin 8.0 and we will set her up for 2 units of packed red blood cells later this week.  I was notified by nursing that the patient has poor IV access.  An order has been placed for Port-A-Cath placement in interventional radiology prior to her next cycle of chemotherapy.  The patient will continue to weekly labs at [facility] assisted living.  She will have a return visit in 3 weeks for evaluation prior to cycle 5 of her chemotherapy.  The patient was advised to call immediately if she has any  concerning symptoms in the interval.  The patient voices understanding of current disease status and treatment options and is in agreement with the current care plan.  All questions were answered. The patient knows to call the clinic with any problems, questions or concerns. We can certainly see the patient much sooner if necessary.  Orders Placed This Encounter  Procedures  . IR Fluoro Guide CV Line Right    Pt resides at [facility] assisted living. Please contact facility to notify of day and time. [phone]    Standing Status:   Future    Standing Expiration Date:   06/23/2018    Order Specific Question:   Reason for exam:    Answer:   [facility] placement for chemotherapy. Pt has stage IV lung cancer.    Order Specific Question:   Preferred Imaging Location?    Answer:   [facility]  . Practitioner attestation of consent    I, the ordering practitioner, attest  that I have discussed with the patient the benefits, risks, side effects, alternatives, likelihood of achieving goals and potential problems during recovery for the procedure listed.    Standing Status:   Future    Standing Expiration Date:   04/24/2018    Order Specific Question:   Procedure    Answer:   Blood Product(s)  . Complete patient signature process for consent form    Standing Status:   Future    Standing Expiration Date:   04/24/2018  . Care order/instruction    Transfuse Parameters    Standing Status:   Future    Standing Expiration Date:   04/24/2018  . Practitioner attestation of consent    I, the ordering practitioner, attest that I have discussed with the patient the benefits, risks, side effects, alternatives, likelihood of achieving goals and potential problems during recovery for the procedure listed.    Standing Status:   Future    Standing Expiration Date:   04/24/2018    Order Specific Question:   Procedure    Answer:   Blood Product(s)  . Complete patient signature process for consent form    Standing  Status:   Future    Standing Expiration Date:   04/24/2018  . Care order/instruction    Transfuse Parameters    Standing Status:   Future    Standing Expiration Date:   04/24/2018  . Type and screen    Standing Status:   Future    Number of Occurrences:   1    Standing Expiration Date:   04/24/2018    Mikey Bussing, DNP, AGPCNP-BC, AOCNP 04/24/17  ADDENDUM: Hematology/Oncology Attending: I had a face-to-face encounter with the patient today.  I recommended her care plan.  This is a very pleasant 84 years old white female with a stage IV non-small cell lung cancer, squamous cell carcinoma.  She is currently undergoing systemic chemotherapy with carboplatin and paclitaxel status post 3 cycles.  She has been tolerating this treatment well with no significant complaints. She had repeat CT scan of the chest, abdomen and pelvis performed recently.  I personally and independently reviewed the scans and discussed the results with the patient today. Her scan showed no evidence for disease progression.  I recommended for the patient to continue her current systemic chemotherapy with carboplatin and paclitaxel and she will proceed with cycle #4 today. I will see her back for follow-up visit in 3 weeks for evaluation before the next cycle of her treatment. The patient was advised to call immediately if she has any concerning symptoms in the interval.  Disclaimer: This note was dictated with voice recognition software. Similar sounding words can inadvertently be transcribed and may be missed upon review. Eilleen Kempf, MD 04/24/17

## 2017-04-24 NOTE — Telephone Encounter (Signed)
No additional appts to schedule - appts already scheduled per 1/23 los.

## 2017-04-24 NOTE — Assessment & Plan Note (Signed)
This is a very pleasant 84 year old white female with a stage IV non-small cell lung cancer, squamous cell carcinoma.  She is currently undergoing systemic chemotherapy with carboplatin and paclitaxel status post 3 cycles. The patient continues to tolerate this treatment fairly well with no significant adverse effects.  The patient was seen with Dr. Julien Nordmann.  CT scan results were reviewed with the patient which showed no evidence of progressive disease.  Recommend that she proceed with cycle 4 of her chemotherapy as scheduled today.  The patient has a hemoglobin 8.0 and we will set her up for 2 units of packed red blood cells later this week.  I was notified by nursing that the patient has poor IV access.  An order has been placed for Port-A-Cath placement in interventional radiology prior to her next cycle of chemotherapy.  The patient will continue to weekly labs at [facility] assisted [facility].  She will have a return visit in 3 weeks for evaluation prior to cycle 5 of her chemotherapy.  The patient was advised to call immediately if she has any concerning symptoms in the interval.  The patient voices understanding of current disease status and treatment options and is in agreement with the current care plan.  All questions were answered. The patient knows to call the clinic with any problems, questions or concerns. We can certainly see the patient much sooner if necessary.

## 2017-04-24 NOTE — Progress Notes (Unsigned)
Neulasta d/c instructions written on card insert, provided in onpro kit, and provided to patient with discharge paperwork. Verbal d/c instructions for onpro also communicated to [place] transportation driver. Driver and patient verbalized understanding. Patient also instucted to not remove blue blood bank bracelet. Patient verbalized understanding

## 2017-04-26 ENCOUNTER — Inpatient Hospital Stay: Payer: Medicare Other

## 2017-04-26 DIAGNOSIS — Z5111 Encounter for antineoplastic chemotherapy: Secondary | ICD-10-CM | POA: Diagnosis not present

## 2017-04-26 DIAGNOSIS — C3492 Malignant neoplasm of unspecified part of left bronchus or lung: Secondary | ICD-10-CM

## 2017-04-26 DIAGNOSIS — D649 Anemia, unspecified: Secondary | ICD-10-CM

## 2017-04-26 LAB — CBC WITH DIFFERENTIAL/PLATELET
BASOS ABS: 0.1 10*3/uL (ref 0.0–0.1)
BASOS PCT: 0 %
EOS ABS: 0 10*3/uL (ref 0.0–0.5)
EOS PCT: 0 %
HCT: 25.2 % — ABNORMAL LOW (ref 34.8–46.6)
Hemoglobin: 8.2 g/dL — ABNORMAL LOW (ref 11.6–15.9)
Lymphocytes Relative: 2 %
Lymphs Abs: 0.6 10*3/uL — ABNORMAL LOW (ref 0.9–3.3)
MCH: 28.4 pg (ref 25.1–34.0)
MCHC: 32.4 g/dL (ref 31.5–36.0)
MCV: 87.6 fL (ref 79.5–101.0)
MONO ABS: 0.5 10*3/uL (ref 0.1–0.9)
Monocytes Relative: 2 %
Neutro Abs: 29.7 10*3/uL — ABNORMAL HIGH (ref 1.5–6.5)
Neutrophils Relative %: 96 %
PLATELETS: 185 10*3/uL (ref 145–400)
RBC: 2.88 MIL/uL — ABNORMAL LOW (ref 3.70–5.45)
RDW: 26.2 % — AB (ref 11.2–16.1)
WBC: 31 10*3/uL — AB (ref 3.9–10.3)

## 2017-04-26 LAB — COMPREHENSIVE METABOLIC PANEL
ALBUMIN: 3.5 g/dL (ref 3.5–5.0)
ALT: 9 U/L (ref 0–55)
AST: 21 U/L (ref 5–34)
Alkaline Phosphatase: 72 U/L (ref 40–150)
Anion gap: 9 (ref 3–11)
BUN: 27 mg/dL — AB (ref 7–26)
CHLORIDE: 96 mmol/L — AB (ref 98–109)
CO2: 33 mmol/L — AB (ref 22–29)
Calcium: 9.5 mg/dL (ref 8.4–10.4)
Creatinine, Ser: 1.21 mg/dL — ABNORMAL HIGH (ref 0.60–1.10)
GFR calc Af Amer: 47 mL/min — ABNORMAL LOW (ref >60)
GFR, EST NON AFRICAN AMERICAN: 41 mL/min — AB (ref >60)
Glucose, Bld: 94 mg/dL (ref 70–140)
POTASSIUM: 4.1 mmol/L (ref 3.3–4.7)
SODIUM: 138 mmol/L (ref 136–145)
Total Bilirubin: 0.3 mg/dL (ref 0.2–1.2)
Total Protein: 7 g/dL (ref 6.4–8.3)

## 2017-04-26 LAB — PREPARE RBC (CROSSMATCH)

## 2017-04-26 MED ORDER — SODIUM CHLORIDE 0.9% FLUSH
3.0000 mL | INTRAVENOUS | Status: DC | PRN
  Filled 2017-04-26: qty 10

## 2017-04-26 MED ORDER — DIPHENHYDRAMINE HCL 25 MG PO CAPS
ORAL_CAPSULE | ORAL | Status: AC
  Filled 2017-04-26: qty 1

## 2017-04-26 MED ORDER — ACETAMINOPHEN 325 MG PO TABS
ORAL_TABLET | ORAL | Status: AC
  Filled 2017-04-26: qty 2

## 2017-04-26 MED ORDER — SODIUM CHLORIDE 0.9% FLUSH
10.0000 mL | INTRAVENOUS | Status: DC | PRN
  Filled 2017-04-26: qty 10

## 2017-04-26 MED ORDER — ACETAMINOPHEN 325 MG PO TABS
650.0000 mg | ORAL_TABLET | Freq: Once | ORAL | Status: AC
  Administered 2017-04-26: 650 mg via ORAL

## 2017-04-26 MED ORDER — DIPHENHYDRAMINE HCL 25 MG PO CAPS
25.0000 mg | ORAL_CAPSULE | Freq: Once | ORAL | Status: AC
  Administered 2017-04-26: 25 mg via ORAL

## 2017-04-26 MED ORDER — SODIUM CHLORIDE 0.9 % IV SOLN
250.0000 mL | Freq: Once | INTRAVENOUS | Status: AC
  Administered 2017-04-26: 250 mL via INTRAVENOUS

## 2017-04-26 NOTE — Patient Instructions (Signed)

## 2017-04-28 LAB — TYPE AND SCREEN
ABO/RH(D): A POS
Antibody Screen: NEGATIVE
UNIT DIVISION: 0
Unit division: 0

## 2017-04-28 LAB — BPAM RBC
Blood Product Expiration Date: 201902082359
Blood Product Expiration Date: 201902092359
ISSUE DATE / TIME: 201901251136
ISSUE DATE / TIME: 201901251136
UNIT TYPE AND RH: 6200
Unit Type and Rh: 6200

## 2017-04-29 ENCOUNTER — Other Ambulatory Visit: Payer: Medicare Other

## 2017-05-03 ENCOUNTER — Other Ambulatory Visit: Payer: Self-pay | Admitting: Radiology

## 2017-05-06 ENCOUNTER — Ambulatory Visit (HOSPITAL_COMMUNITY)
Admission: RE | Admit: 2017-05-06 | Discharge: 2017-05-06 | Disposition: A | Discharge status: Home / Self Care | Payer: Medicare Other | Source: Ambulatory Visit | Attending: Oncology | Admitting: Oncology

## 2017-05-06 ENCOUNTER — Encounter (HOSPITAL_COMMUNITY): Payer: Self-pay

## 2017-05-06 ENCOUNTER — Other Ambulatory Visit: Payer: Medicare Other

## 2017-05-06 DIAGNOSIS — C3492 Malignant neoplasm of unspecified part of left bronchus or lung: Secondary | ICD-10-CM | POA: Insufficient documentation

## 2017-05-06 DIAGNOSIS — N183 Chronic kidney disease, stage 3 (moderate): Secondary | ICD-10-CM | POA: Diagnosis not present

## 2017-05-06 DIAGNOSIS — Z9981 Dependence on supplemental oxygen: Secondary | ICD-10-CM | POA: Diagnosis not present

## 2017-05-06 DIAGNOSIS — Z7901 Long term (current) use of anticoagulants: Secondary | ICD-10-CM | POA: Insufficient documentation

## 2017-05-06 DIAGNOSIS — Z7951 Long term (current) use of inhaled steroids: Secondary | ICD-10-CM | POA: Insufficient documentation

## 2017-05-06 DIAGNOSIS — J9610 Chronic respiratory failure, unspecified whether with hypoxia or hypercapnia: Secondary | ICD-10-CM | POA: Insufficient documentation

## 2017-05-06 DIAGNOSIS — M810 Age-related osteoporosis without current pathological fracture: Secondary | ICD-10-CM | POA: Insufficient documentation

## 2017-05-06 DIAGNOSIS — Z87891 Personal history of nicotine dependence: Secondary | ICD-10-CM | POA: Diagnosis not present

## 2017-05-06 DIAGNOSIS — Z5309 Procedure and treatment not carried out because of other contraindication: Secondary | ICD-10-CM | POA: Insufficient documentation

## 2017-05-06 DIAGNOSIS — J449 Chronic obstructive pulmonary disease, unspecified: Secondary | ICD-10-CM | POA: Insufficient documentation

## 2017-05-06 LAB — CBC
HCT: 32.4 % — ABNORMAL LOW (ref 36.0–46.0)
HEMOGLOBIN: 10.6 g/dL — AB (ref 12.0–15.0)
MCH: 30.4 pg (ref 26.0–34.0)
MCHC: 32.7 g/dL (ref 30.0–36.0)
MCV: 92.8 fL (ref 78.0–100.0)
PLATELETS: 23 10*3/uL — AB (ref 150–400)
RBC: 3.49 MIL/uL — AB (ref 3.87–5.11)
RDW: 19.2 % — ABNORMAL HIGH (ref 11.5–15.5)
WBC: 6 10*3/uL (ref 4.0–10.5)

## 2017-05-06 LAB — BASIC METABOLIC PANEL
ANION GAP: 6 (ref 5–15)
BUN: 21 mg/dL — ABNORMAL HIGH (ref 6–20)
CALCIUM: 9.6 mg/dL (ref 8.9–10.3)
CHLORIDE: 97 mmol/L — AB (ref 101–111)
CO2: 34 mmol/L — ABNORMAL HIGH (ref 22–32)
CREATININE: 0.98 mg/dL (ref 0.44–1.00)
GFR calc non Af Amer: 53 mL/min — ABNORMAL LOW (ref >60)
Glucose, Bld: 96 mg/dL (ref 65–99)
Potassium: 4.9 mmol/L (ref 3.5–5.1)
SODIUM: 137 mmol/L (ref 135–145)

## 2017-05-06 LAB — PROTIME-INR
INR: 0.94
PROTHROMBIN TIME: 12.5 s (ref 11.4–15.2)

## 2017-05-06 MED ORDER — SODIUM CHLORIDE 0.9 % IV SOLN
INTRAVENOUS | Status: DC
  Administered 2017-05-06: 13:00:00 via INTRAVENOUS

## 2017-05-06 MED ORDER — VANCOMYCIN HCL IN DEXTROSE 1-5 GM/200ML-% IV SOLN: 1000.0000 mg | INTRAVENOUS | Status: DC

## 2017-05-06 NOTE — Progress Notes (Signed)
Report called to [facility].  Spoke with Janan Halter, CNA notifying patient will not have procedure today due to low platelet count.  Request transportation back to [facility].  Bridgette verbalized understanding.

## 2017-05-06 NOTE — Progress Notes (Signed)
CRITICAL VALUE ALERT  Critical Value:  Platelet Count 23  Date & Time Notied:  05/06/17 at 1421  Provider Notified: Rowe Robert, PA   Orders Received/Actions taken: PA made aware. Made this nurse aware to hold patient at this time until given further orders.

## 2017-05-06 NOTE — Consult Note (Signed)
Chief Complaint: Patient was seen in consultation today for Port-A-Cath placement  Referring Physician(s): [place]  Supervising Physician: Arne Cleveland  Patient Status: [name]  History of Present Illness: Paula Contreras is a 84 y.o. female with history of stage IV non-small cancer/squamous cell cancer of left lung and poor venous access who presents today for Port-A-Cath placement for chemotherapy.  Past Medical History:  Diagnosis Date  . Anemia 02/20/2015  . Aortic stenosis    a. 2D Echo 07/2013: EF 60-65%, no RWMA, grade 1 DD, normal LV filling pressure, moderate AS, mild TR, PASP 73mmHg.  . Bradycardia    a. possible bradycardia with HR 44 by physical exam notes at nephrologist office in 03/2014. F/u event monitor showed NSR PACs average HR 81, bradycardia <1% of readable data, no pauses of 3 seconds or longer..  . Cataract   . Chronic respiratory failure ([place])    a. On home O2 since 2012.  . CKD (chronic kidney disease), stage III ([place])   . COPD (chronic obstructive pulmonary disease) ([place])   . Dementia   . Hypertension   . Osteoporosis   . Other psoriasis and similar disorders    psoriasis vs eczema  . Paroxysmal atrial fibrillation ([place])    a. Dx 07/2013. No attempts per Epic to restore NSR but in NSR 2016.  Marland Kitchen Pneumonia 03/2016  . Skin cancer     Past Surgical History:  Procedure Laterality Date  . BREAST SURGERY Right 1964   tumor removed  . CATARACT EXTRACTION Bilateral   . ERCP N/A 02/29/2016   Procedure: ENDOSCOPIC RETROGRADE CHOLANGIOPANCREATOGRAPHY (ERCP);  Surgeon: Irene Shipper, MD;  Location: [facility];  Service: Endoscopy;  Laterality: N/A;  . HIP SURGERY Right    fracture  . VIDEO BRONCHOSCOPY Bilateral 12/04/2016   Procedure: VIDEO BRONCHOSCOPY WITHOUT FLUORO;  Surgeon: Collene Gobble, MD;  Location: [facility] ENDOSCOPY;  Service: Cardiopulmonary;  Laterality: Bilateral;    Allergies: Codeine and Robitussin (alcohol free)  [guaifenesin]  Medications: Prior to Admission medications   Medication Sig Start Date End Date Taking? Authorizing Provider  acetaminophen (TYLENOL) 325 MG tablet Take 650 mg by mouth every 6 (six) hours as needed (for pain).     [provider]  albuterol (PROAIR HFA) 108 (90 BASE) MCG/ACT inhaler Inhale 2 puffs into the lungs every 4 (four) hours as needed for wheezing or shortness of breath. 12/24/14   Collene Gobble, MD  alendronate (FOSAMAX) 70 MG tablet Take 70 mg by mouth every Monday. Take with a full glass of water on an empty stomach.    [provider]  benzonatate (TESSALON) 100 MG capsule Take 1 capsule (100 mg total) by mouth 3 (three) times daily as needed for cough. 12/07/16   [name], MD  calcium carbonate (OS-CAL) 600 MG TABS tablet Take 2 tablets by mouth daily with breakfast.     [provider]  carvedilol (COREG) 3.125 MG tablet  01/28/17   [provider]  Cholecalciferol (VITAMIN D) 2000 UNITS CAPS Take 2,000 Units by mouth daily with breakfast.     [provider]  esomeprazole (NEXIUM) 20 MG capsule Take 20 mg by mouth daily at 12 noon.    [provider]  feeding supplement (BOOST / RESOURCE BREEZE) LIQD Take 1 Container by mouth every evening.     [provider]  fluticasone (FLONASE) 50 MCG/ACT nasal spray Place 1 spray into both nostrils daily. 03/03/16   Eugenie Filler, MD  hydrocortisone 2.5 % cream  11/20/16   [provider]  ipratropium-albuterol (DUONEB) 0.5-2.5 (3) MG/3ML SOLN  01/11/17   [provider]  levalbuterol (XOPENEX) 0.63 MG/3ML nebulizer solution Take 3 mLs (0.63 mg total) by nebulization every 6 (six) hours as needed for wheezing or shortness of breath. 03/02/16   Eugenie Filler, MD  Multiple Vitamins-Iron (MULTIVITAMIN/IRON PO) Take 1 tablet by mouth daily.    [provider]  OXYGEN Inhale 2-4 L into the lungs continuous. To keep O2 stats >90%     [provider]  prochlorperazine (COMPAZINE) 10 MG tablet Take 1 tablet (10 mg total) every 6 (six) hours as needed by mouth for nausea or vomiting. 02/04/17   Curt Bears, MD  senna-docusate (SENOKOT-S) 8.6-50 MG tablet Take 1 tablet by mouth at bedtime as needed for mild constipation. 02/23/15   Theodis Blaze, MD  Tiotropium Bromide Monohydrate (SPIRIVA RESPIMAT) 2.5 MCG/ACT AERS Inhale 2 puffs into the lungs daily.    [provider]  XARELTO 20 MG TABS tablet  11/19/16   [provider]     Family History  Problem Relation Age of Onset  . Asthma Mother   . Kidney disease Mother   . Lung cancer Father   . Asthma Maternal Grandmother   . Lung cancer Paternal Uncle        father's twin brother  . Diabetes Brother     Social History   Socioeconomic History  . Marital status: Widowed    Spouse name: Not on file  . Number of children: 0  . Years of education: Not on file  . Highest education level: Not on file  Social Needs  . Financial resource strain: Not on file  . Food insecurity - worry: Not on file  . Food insecurity - inability: Not on file  . Transportation needs - medical: Not on file  . Transportation needs - non-medical: Not on file  Occupational History  . Occupation: data entry specialist    Comment: retired  Tobacco Use  . Smoking status: Former Smoker    Packs/day: 1.00    Years: 55.00    Pack years: 55.00    Types: Cigarettes    Last attempt to quit: 09/25/2010    Years since quitting: 6.6  . Smokeless tobacco: Never Used  Substance and Sexual Activity  . Alcohol use: No  . Drug use: No  . Sexual activity: Not on file  Other Topics Concern  . Not on file  Social History Narrative  . Not on file      Review of Systems she currently denies fever, headache, chest pain, cough, abdominal/back pain, nausea, vomiting or bleeding.  She does have chronic dyspnea.  Vital Signs: Vitals:   05/06/17 1307  BP: 122/75  Pulse:  67  Resp: 16  Temp: 98.3 F (36.8 C)  SpO2: 100%     Physical Exam awake, alert to person, birth date; chest with few scattered wheezes; heart with normal rate, positive murmur, currently regular rhythm.  Abdomen soft, positive bowel sounds, nontender.  Lower extremity edema  Imaging: Ct Chest W Contrast  Result Date: 04/23/2017 CLINICAL DATA:  Non-small-cell lung cancer. EXAM: CT CHEST, ABDOMEN, AND PELVIS WITH CONTRAST TECHNIQUE: Multidetector CT imaging of the chest, abdomen and pelvis was performed following the standard protocol during bolus administration of intravenous contrast. CONTRAST:  162mL ISOVUE-300 IOPAMIDOL (ISOVUE-300) INJECTION 61% COMPARISON:  PET-CT 01/28/2017. Chest CT 01/15/2017. Abdomen and pelvis CT 12/03/2016. FINDINGS: CT CHEST  FINDINGS Cardiovascular: The heart size is normal. No pericardial effusion. Coronary artery calcification is evident. Atherosclerotic calcification is noted in the wall of the thoracic aorta. Mediastinum/Nodes: No mediastinal lymphadenopathy. Abnormal soft tissue in the left upper hilum is similar to prior PET-CT. Mass lesion identified on previous PET-CT not readily evident on today's CT scan given the adjacent left upper lobe collapse. Esophagus is diffusely fluid filled proximal to a moderate hiatal hernia which may be related to reflux or dysmotility. There is no axillary lymphadenopathy. Lungs/Pleura: Similar appearance marked volume loss left hemithorax right lung hyperexpanded. Emphysema noted bilaterally. Small left pleural effusion evident. Musculoskeletal: Bones are diffusely demineralized. T10 compression fracture again noted. CT ABDOMEN PELVIS FINDINGS Hepatobiliary: Small area of low attenuation in the anterior liver, adjacent to the falciform ligament, is in a characteristic location for focal fatty deposition. Pneumobilia is compatible with prior sphincterotomy. Gallbladder surgically absent. Pancreas: No focal mass lesion. No dilatation  of the main duct. No intraparenchymal cyst. No peripancreatic edema. Spleen: No splenomegaly. No focal mass lesion. Adrenals/Urinary Tract: Stable 7 mm left adrenal nodule. Right adrenal gland unremarkable. No suspicious enhancing lesion in either kidney. No hydronephrosis. No evidence for hydroureter. The urinary bladder appears normal for the degree of distention. Stomach/Bowel: Moderate hiatal hernia. Stomach otherwise unremarkable. Duodenum is normally positioned as is the ligament of Treitz. No small bowel wall thickening. No small bowel dilatation. The terminal ileum is normal. The appendix is not visualized, but there is no edema or inflammation in the region of the cecum. No gross colonic mass. No colonic wall thickening. No substantial diverticular change. Vascular/Lymphatic: There is abdominal aortic atherosclerosis without aneurysm. There is no gastrohepatic or hepatoduodenal ligament lymphadenopathy. No intraperitoneal or retroperitoneal lymphadenopathy. No pelvic sidewall lymphadenopathy. Reproductive: The uterus has normal CT imaging appearance. Interval progression of right adnexal cystic mass now measuring 7.7 cm compared to 6.8 cm on the CT from 12/03/2016. This lesion showed no hypermetabolism on the previous PET-CT. Other: No intraperitoneal free fluid. Musculoskeletal: Diffuse bony demineralization. Status post screw placement in the right femoral neck. Bone windows reveal no worrisome lytic or sclerotic osseous lesions. Compression deformity at L1, L4 and L5 is similar to prior. IMPRESSION: 1. Similar appearance to left upper lobe collapse with abnormal soft tissue in the left hilum when comparing to previous PET-CT. No evidence for mediastinal lymphadenopathy on the current study. 2. Persistent small left pleural effusion. 3. Stable 7 mm left adrenal nodule. 4. Slight interval progression of the large right adnexal cyst without hypermetabolism on the recent study. Benign neoplasm could have  this appearance. 5. Compression deformity at T10, L1, L4, and L5, similar to prior. 6. Moderate hiatal hernia. 7.  Aortic Atherosclerois (ICD10-170.0) 8.  Emphysema. ([id number]-V89.9) Electronically Signed   By: Misty Stanley M.D.   On: 04/23/2017 13:47   Ct Abdomen Pelvis W Contrast  Result Date: 04/23/2017 CLINICAL DATA:  Non-small-cell lung cancer. EXAM: CT CHEST, ABDOMEN, AND PELVIS WITH CONTRAST TECHNIQUE: Multidetector CT imaging of the chest, abdomen and pelvis was performed following the standard protocol during bolus administration of intravenous contrast. CONTRAST:  131mL ISOVUE-300 IOPAMIDOL (ISOVUE-300) INJECTION 61% COMPARISON:  PET-CT 01/28/2017. Chest CT 01/15/2017. Abdomen and pelvis CT 12/03/2016. FINDINGS: CT CHEST FINDINGS Cardiovascular: The heart size is normal. No pericardial effusion. Coronary artery calcification is evident. Atherosclerotic calcification is noted in the wall of the thoracic aorta. Mediastinum/Nodes: No mediastinal lymphadenopathy. Abnormal soft tissue in the left upper hilum is similar to prior PET-CT. Mass lesion identified on previous PET-CT  not readily evident on today's CT scan given the adjacent left upper lobe collapse. Esophagus is diffusely fluid filled proximal to a moderate hiatal hernia which may be related to reflux or dysmotility. There is no axillary lymphadenopathy. Lungs/Pleura: Similar appearance marked volume loss left hemithorax right lung hyperexpanded. Emphysema noted bilaterally. Small left pleural effusion evident. Musculoskeletal: Bones are diffusely demineralized. T10 compression fracture again noted. CT ABDOMEN PELVIS FINDINGS Hepatobiliary: Small area of low attenuation in the anterior liver, adjacent to the falciform ligament, is in a characteristic location for focal fatty deposition. Pneumobilia is compatible with prior sphincterotomy. Gallbladder surgically absent. Pancreas: No focal mass lesion. No dilatation of the main duct. No  intraparenchymal cyst. No peripancreatic edema. Spleen: No splenomegaly. No focal mass lesion. Adrenals/Urinary Tract: Stable 7 mm left adrenal nodule. Right adrenal gland unremarkable. No suspicious enhancing lesion in either kidney. No hydronephrosis. No evidence for hydroureter. The urinary bladder appears normal for the degree of distention. Stomach/Bowel: Moderate hiatal hernia. Stomach otherwise unremarkable. Duodenum is normally positioned as is the ligament of Treitz. No small bowel wall thickening. No small bowel dilatation. The terminal ileum is normal. The appendix is not visualized, but there is no edema or inflammation in the region of the cecum. No gross colonic mass. No colonic wall thickening. No substantial diverticular change. Vascular/Lymphatic: There is abdominal aortic atherosclerosis without aneurysm. There is no gastrohepatic or hepatoduodenal ligament lymphadenopathy. No intraperitoneal or retroperitoneal lymphadenopathy. No pelvic sidewall lymphadenopathy. Reproductive: The uterus has normal CT imaging appearance. Interval progression of right adnexal cystic mass now measuring 7.7 cm compared to 6.8 cm on the CT from 12/03/2016. This lesion showed no hypermetabolism on the previous PET-CT. Other: No intraperitoneal free fluid. Musculoskeletal: Diffuse bony demineralization. Status post screw placement in the right femoral neck. Bone windows reveal no worrisome lytic or sclerotic osseous lesions. Compression deformity at L1, L4 and L5 is similar to prior. IMPRESSION: 1. Similar appearance to left upper lobe collapse with abnormal soft tissue in the left hilum when comparing to previous PET-CT. No evidence for mediastinal lymphadenopathy on the current study. 2. Persistent small left pleural effusion. 3. Stable 7 mm left adrenal nodule. 4. Slight interval progression of the large right adnexal cyst without hypermetabolism on the recent study. Benign neoplasm could have this appearance. 5.  Compression deformity at T10, L1, L4, and L5, similar to prior. 6. Moderate hiatal hernia. 7.  Aortic Atherosclerois (ICD10-170.0) 8.  Emphysema. ([id number]) Electronically Signed   By: Misty Stanley M.D.   On: 04/23/2017 13:47    Labs:  CBC: Recent Labs    03/28/17 0915 04/01/17 0738 04/24/17 0952 04/26/17 1040  WBC 4.0 3.8* 6.6 31.0*  HGB 10.0* 10.4* 8.0* 8.2*  HCT 31.5* 32.5* 26.3* 25.2*  PLT 89* 214 136* 185    COAGS: Recent Labs    11/29/16 1636 12/02/16 0826 12/03/16 0913  INR 1.93 1.01 1.03    BMP: Recent Labs    12/01/16 0557 12/03/16 0913  03/28/17 0915 04/01/17 0738 04/24/17 0952 04/26/17 1040  NA 138 139   < > 140 140 136 138  K 4.2 4.5   < > 4.4 4.5 4.6 4.1  CL 100* 102  --   --   --  95* 96*  CO2 32 29   < > 33* 33* 34* 33*  GLUCOSE 92 90   < > 84 85 90 94  BUN 15 10   < > 24.6 16.3 18 27*  CALCIUM 9.1 8.9   < >  9.9 9.5 10.1 9.5  CREATININE 1.10* 1.04*   < > 1.1 1.0 1.10 1.21*  GFRNONAA 46* 49*  --   --   --  46* 41*  GFRAA 53* 57*  --   --   --  53* 47*   < > = values in this interval not displayed.    LIVER FUNCTION TESTS: Recent Labs    03/28/17 0915 04/01/17 0738 04/24/17 0952 04/26/17 1040  BILITOT 0.37 0.22 0.2 0.3  AST 18 18 16 21   ALT 13 14 11 9   ALKPHOS 64 66 64 72  PROT 7.1 7.0 6.8 7.0  ALBUMIN 3.5 3.4* 3.3* 3.5    TUMOR MARKERS: No results for input(s): AFPTM, CEA, CA199, CHROMGRNA in the last 8760 hours.  Assessment and Plan: 84 y.o. female with history of stage IV non-small cancer/squamous cell cancer of left lung and poor venous access who presents today for Port-A-Cath placement for chemotherapy.Risks and benefits discussed with the patient/sister-in- law including, but not limited to bleeding, infection, pneumothorax, or fibrin sheath development and need for additional procedures.All of the patient's questions were answered, patient is agreeable to proceed.Consent signed and in chart.Labs pend.     Thank you for  this interesting consult.  I greatly enjoyed meeting Paula Contreras and look forward to participating in their care.  A copy of this report was sent to the requesting provider on this date.  Electronically Signed: D. Rowe Robert, PA-C 05/06/2017, 12:45 PM  I spent a total of 20 minutes   in face to face in clinical consultation, greater than 50% of which was counseling/coordinating care for Port-A-Cath placement

## 2017-05-06 NOTE — Progress Notes (Signed)
Patient's Port-A-Cath placement today has been postponed secondary to platelet count of 23k; Dr. Julien Nordmann notified and stated that he would allow time for platelet count to recover before rescheduling Port-A-Cath placement.  No additional orders received.  Pt notified.

## 2017-05-08 ENCOUNTER — Telehealth: Payer: Self-pay | Admitting: Medical Oncology

## 2017-05-08 ENCOUNTER — Other Ambulatory Visit: Payer: Self-pay | Admitting: Medical Oncology

## 2017-05-08 NOTE — Telephone Encounter (Signed)
Returned called to [name] and told her [facility] on hold until evaluated by [employer] on MOnday. Joy will need orders for port maintenance .

## 2017-05-13 ENCOUNTER — Inpatient Hospital Stay: Payer: Medicare Other | Attending: Internal Medicine | Admitting: Oncology

## 2017-05-13 ENCOUNTER — Inpatient Hospital Stay: Payer: Medicare Other

## 2017-05-13 ENCOUNTER — Telehealth: Payer: Self-pay

## 2017-05-13 ENCOUNTER — Encounter: Payer: Self-pay | Admitting: Oncology

## 2017-05-13 VITALS — BP 103/70 | HR 112 | Temp 97.7°F | Resp 16 | Ht 66.0 in | Wt 123.6 lb

## 2017-05-13 DIAGNOSIS — C3402 Malignant neoplasm of left main bronchus: Secondary | ICD-10-CM | POA: Diagnosis not present

## 2017-05-13 DIAGNOSIS — Z5189 Encounter for other specified aftercare: Secondary | ICD-10-CM | POA: Insufficient documentation

## 2017-05-13 DIAGNOSIS — N183 Chronic kidney disease, stage 3 (moderate): Secondary | ICD-10-CM | POA: Insufficient documentation

## 2017-05-13 DIAGNOSIS — C3492 Malignant neoplasm of unspecified part of left bronchus or lung: Secondary | ICD-10-CM

## 2017-05-13 DIAGNOSIS — D6959 Other secondary thrombocytopenia: Secondary | ICD-10-CM | POA: Diagnosis not present

## 2017-05-13 DIAGNOSIS — Z5111 Encounter for antineoplastic chemotherapy: Secondary | ICD-10-CM | POA: Insufficient documentation

## 2017-05-13 DIAGNOSIS — D6481 Anemia due to antineoplastic chemotherapy: Secondary | ICD-10-CM | POA: Diagnosis not present

## 2017-05-13 DIAGNOSIS — D696 Thrombocytopenia, unspecified: Secondary | ICD-10-CM | POA: Insufficient documentation

## 2017-05-13 LAB — COMPREHENSIVE METABOLIC PANEL
ALK PHOS: 72 U/L (ref 40–150)
ALT: 14 U/L (ref 0–55)
ANION GAP: 9 (ref 3–11)
AST: 17 U/L (ref 5–34)
Albumin: 3.6 g/dL (ref 3.5–5.0)
BILIRUBIN TOTAL: 0.4 mg/dL (ref 0.2–1.2)
BUN: 24 mg/dL (ref 7–26)
CALCIUM: 10.1 mg/dL (ref 8.4–10.4)
CO2: 32 mmol/L — ABNORMAL HIGH (ref 22–29)
CREATININE: 1.12 mg/dL — AB (ref 0.60–1.10)
Chloride: 96 mmol/L — ABNORMAL LOW (ref 98–109)
GFR calc non Af Amer: 45 mL/min — ABNORMAL LOW (ref >60)
GFR, EST AFRICAN AMERICAN: 52 mL/min — AB (ref >60)
Glucose, Bld: 93 mg/dL (ref 70–140)
Potassium: 4.7 mmol/L (ref 3.5–5.1)
Sodium: 137 mmol/L (ref 136–145)
TOTAL PROTEIN: 7.3 g/dL (ref 6.4–8.3)

## 2017-05-13 LAB — CBC WITH DIFFERENTIAL/PLATELET
BASOS ABS: 0 10*3/uL (ref 0.0–0.1)
BASOS PCT: 0 %
EOS ABS: 0 10*3/uL (ref 0.0–0.5)
Eosinophils Relative: 0 %
HCT: 32.6 % — ABNORMAL LOW (ref 34.8–46.6)
HEMOGLOBIN: 10.5 g/dL — AB (ref 11.6–15.9)
Lymphocytes Relative: 14 %
Lymphs Abs: 0.6 10*3/uL — ABNORMAL LOW (ref 0.9–3.3)
MCH: 30.4 pg (ref 25.1–34.0)
MCHC: 32.2 g/dL (ref 31.5–36.0)
MCV: 94.5 fL (ref 79.5–101.0)
MONO ABS: 0.5 10*3/uL (ref 0.1–0.9)
Monocytes Relative: 11 %
NEUTROS PCT: 75 %
Neutro Abs: 3.4 10*3/uL (ref 1.5–6.5)
PLATELETS: 21 10*3/uL — AB (ref 145–400)
RBC: 3.45 MIL/uL — ABNORMAL LOW (ref 3.70–5.45)
RDW: 19.3 % — ABNORMAL HIGH (ref 11.2–14.5)
WBC: 4.5 10*3/uL (ref 3.9–10.3)
nRBC: 0 /100 WBC

## 2017-05-13 NOTE — Telephone Encounter (Signed)
Printed avs and calender for upcoming appointment. Per 2/11 los will adjust 3/4 appointments and add 1 week of lab, f/u, and inf.

## 2017-05-13 NOTE — Progress Notes (Signed)
[place] OFFICE PROGRESS NOTE  System, Pcp Not In No address on file  DIAGNOSIS: stage IV open (T3, N1, M1a) metastatic non-small cell lung cancer,squamous cell carcinoma presented with large central obstructive left hilar mass with hilar level at The patient left pleural effusion diagnosed in September 2018.  PRIOR THERAPY: Palliative radiotherapy to the left mainstem bronchus for a total dose of 20 GY in 5 fractions completed on 12/14/2016.  CURRENT THERAPY: Palliative systemic chemotherapy with carboplatin for an AUC of 5 and paclitaxel 175 mg/m every 3 weeks with Neulasta support. First dose 02/13/2017.Status post 4 cycles.  INTERVAL HISTORY: Paula Contreras 84 y.o. female returns for routine follow-up visit by herself.  The patient has no specific complaints today.  She tolerated her last cycle of chemotherapy fairly well with the exception of thrombocytopenia.  She was supposed to have a Port-A-Cath placed but this could not be done due to thrombocytopenia.  She has no fevers or chills.  Denies chest pain, shortness of breath at rest, cough, hemoptysis.  She has her baseline shortness of breath with exertion and wears home oxygen.  Denies nausea, vomiting, constipation, diarrhea.  Denies bleeding.  The patient is here for evaluation prior to cycle #5 of her treatment.  MEDICAL HISTORY: Past Medical History:  Diagnosis Date  . Anemia 02/20/2015  . Aortic stenosis    a. 2D Echo 07/2013: EF 60-65%, no RWMA, grade 1 DD, normal LV filling pressure, moderate AS, mild TR, PASP 108mmHg.  . Bradycardia    a. possible bradycardia with HR 44 by physical exam notes at nephrologist office in 03/2014. F/u event monitor showed NSR PACs average HR 81, bradycardia <1% of readable data, no pauses of 3 seconds or longer..  . Cataract   . Chronic respiratory failure ([place])    a. On home O2 since 2012.  . CKD (chronic kidney disease), stage III ([place])   . COPD (chronic obstructive  pulmonary disease) ([place])   . Dementia   . Hypertension   . Osteoporosis   . Other psoriasis and similar disorders    psoriasis vs eczema  . Paroxysmal atrial fibrillation ([place])    a. Dx 07/2013. No attempts per Epic to restore NSR but in NSR 2016.  Marland Kitchen Pneumonia 03/2016  . Skin cancer     ALLERGIES:  is allergic to codeine and robitussin (alcohol free) [guaifenesin].  MEDICATIONS:  Current Outpatient Medications  Medication Sig Dispense Refill  . acetaminophen (TYLENOL) 325 MG tablet Take 650 mg by mouth every 6 (six) hours as needed (for pain).     Marland Kitchen albuterol (PROAIR HFA) 108 (90 BASE) MCG/ACT inhaler Inhale 2 puffs into the lungs every 4 (four) hours as needed for wheezing or shortness of breath. 1 Inhaler 3  . benzonatate (TESSALON) 100 MG capsule Take 1 capsule (100 mg total) by mouth 3 (three) times daily as needed for cough. 20 capsule 0  . carvedilol (COREG) 3.125 MG tablet     . Cholecalciferol (VITAMIN D) 2000 UNITS CAPS Take 2,000 Units by mouth daily with breakfast.     . esomeprazole (NEXIUM) 20 MG capsule Take 20 mg by mouth daily at 12 noon.    . feeding supplement (BOOST / RESOURCE BREEZE) LIQD Take 1 Container by mouth every evening.     Marland Kitchen ipratropium-albuterol (DUONEB) 0.5-2.5 (3) MG/3ML SOLN     . levalbuterol (XOPENEX) 0.63 MG/3ML nebulizer solution Take 3 mLs (0.63 mg total) by nebulization every 6 (six) hours as needed for wheezing  or shortness of breath.    . Multiple Vitamins-Iron (MULTIVITAMIN/IRON PO) Take 1 tablet by mouth daily.    . ondansetron (ZOFRAN-ODT) 4 MG disintegrating tablet Take 1 tablet by mouth every 8 (eight) hours as needed.    . OXYGEN Inhale 2-4 L into the lungs continuous. To keep O2 stats >90%    . senna-docusate (SENOKOT-S) 8.6-50 MG tablet Take 1 tablet by mouth at bedtime as needed for mild constipation.    Marland Kitchen alendronate (FOSAMAX) 70 MG tablet Take 70 mg by mouth every Monday. Take with a full glass of water on an empty stomach.    .  calcium carbonate (OS-CAL) 600 MG TABS tablet Take 2 tablets by mouth daily with breakfast.     . fluticasone (FLONASE) 50 MCG/ACT nasal spray Place 1 spray into both nostrils daily. (Patient not taking: Reported on 05/13/2017)  2  . hydrocortisone 2.5 % cream     . prochlorperazine (COMPAZINE) 10 MG tablet Take 1 tablet (10 mg total) every 6 (six) hours as needed by mouth for nausea or vomiting. (Patient not taking: Reported on 05/13/2017) 30 tablet 0  . Tiotropium Bromide Monohydrate (SPIRIVA RESPIMAT) 2.5 MCG/ACT AERS Inhale 2 puffs into the lungs daily.    [name] 20 MG TABS tablet      No current facility-administered medications for this visit.     SURGICAL HISTORY:  Past Surgical History:  Procedure Laterality Date  . BREAST SURGERY Right 1964   tumor removed  . CATARACT EXTRACTION Bilateral   . ERCP N/A 02/29/2016   Procedure: ENDOSCOPIC RETROGRADE CHOLANGIOPANCREATOGRAPHY (ERCP);  Surgeon: Irene Shipper, MD;  Location: [facility];  Service: Endoscopy;  Laterality: N/A;  . HIP SURGERY Right    fracture  . VIDEO BRONCHOSCOPY Bilateral 12/04/2016   Procedure: VIDEO BRONCHOSCOPY WITHOUT FLUORO;  Surgeon: Collene Gobble, MD;  Location: [facility] ENDOSCOPY;  Service: Cardiopulmonary;  Laterality: Bilateral;    REVIEW OF SYSTEMS:   Review of Systems  Constitutional: Negative for appetite change, chills, fatigue, fever.  She has lost weight since her last visit.  HENT:   Negative for mouth sores, nosebleeds, sore throat and trouble swallowing.   Eyes: Negative for eye problems and icterus.  Respiratory: Negative for cough, hemoptysis, shortness of breath at rest and wheezing.  Positive for shortness of breath with exertion.  Wears home oxygen.  Cardiovascular: Negative for chest pain and leg swelling.  Gastrointestinal: Negative for abdominal pain, constipation, diarrhea, nausea and vomiting.  Genitourinary: Negative for bladder incontinence, difficulty urinating, dysuria, frequency and  hematuria.   Musculoskeletal: Negative for back pain, gait problem, neck pain and neck stiffness.  Skin: Negative for itching and rash.  Neurological: Negative for dizziness, extremity weakness, headaches, light-headedness and seizures.  Hematological: Negative for adenopathy. Does not bruise/bleed easily.  Psychiatric/Behavioral: Negative for confusion, depression and sleep disturbance. The patient is not nervous/anxious.     PHYSICAL EXAMINATION:  Blood pressure 103/70, pulse (!) 112, temperature 97.7 F (36.5 C), temperature source Oral, resp. rate 16, height 5\' 6"  (1.676 m), weight 123 lb 9.6 oz (56.1 kg), SpO2 98 %.  ECOG PERFORMANCE STATUS: 1 - Symptomatic but completely ambulatory  Physical Exam  Constitutional: Oriented to person, place, and time and well-developed, well-nourished, and in no distress. No distress.  HENT:  Head: Normocephalic and atraumatic.  Mouth/Throat: Oropharynx is clear and moist. No oropharyngeal exudate.  Eyes: Conjunctivae are normal. Right eye exhibits no discharge. Left eye exhibits no discharge. No scleral icterus.  Neck: Normal range  of motion. Neck supple.  Cardiovascular: Normal rate, regular rhythm, normal heart sounds and intact distal pulses.   Pulmonary/Chest: Effort normal and breath sounds normal. No respiratory distress. No wheezes. No rales.  Abdominal: Soft. Bowel sounds are normal. Exhibits no distension and no mass. There is no tenderness.  Musculoskeletal: Normal range of motion. Exhibits no edema.  Lymphadenopathy:    No cervical adenopathy.  Neurological: Alert and oriented to person, place, and time. Exhibits normal muscle tone. Coordination normal.  Skin: Skin is warm and dry. No rash noted. Not diaphoretic. No erythema. No pallor.  Psychiatric: Mood normal.  Vitals reviewed.  LABORATORY DATA: Lab Results  Component Value Date   WBC 4.5 05/13/2017   HGB 10.5 (L) 05/13/2017   HCT 32.6 (L) 05/13/2017   MCV 94.5 05/13/2017    PLT 21 (L) 05/13/2017      Chemistry      Component Value Date/Time   NA 137 05/13/2017 0945   NA 140 04/01/2017 0738   K 4.7 05/13/2017 0945   K 4.5 04/01/2017 0738   CL 96 (L) 05/13/2017 0945   CO2 32 (H) 05/13/2017 0945   CO2 33 (H) 04/01/2017 0738   BUN 24 05/13/2017 0945   BUN 16.3 04/01/2017 0738   CREATININE 1.12 (H) 05/13/2017 0945   CREATININE 1.0 04/01/2017 0738      Component Value Date/Time   CALCIUM 10.1 05/13/2017 0945   CALCIUM 9.5 04/01/2017 0738   ALKPHOS 72 05/13/2017 0945   ALKPHOS 66 04/01/2017 0738   AST 17 05/13/2017 0945   AST 18 04/01/2017 0738   ALT 14 05/13/2017 0945   ALT 14 04/01/2017 0738   BILITOT 0.4 05/13/2017 0945   BILITOT 0.22 04/01/2017 0738       RADIOGRAPHIC STUDIES:  Ct Chest W Contrast  Result Date: 04/23/2017 CLINICAL DATA:  Non-small-cell lung cancer. EXAM: CT CHEST, ABDOMEN, AND PELVIS WITH CONTRAST TECHNIQUE: Multidetector CT imaging of the chest, abdomen and pelvis was performed following the standard protocol during bolus administration of intravenous contrast. CONTRAST:  124mL ISOVUE-300 IOPAMIDOL (ISOVUE-300) INJECTION 61% COMPARISON:  PET-CT 01/28/2017. Chest CT 01/15/2017. Abdomen and pelvis CT 12/03/2016. FINDINGS: CT CHEST FINDINGS Cardiovascular: The heart size is normal. No pericardial effusion. Coronary artery calcification is evident. Atherosclerotic calcification is noted in the wall of the thoracic aorta. Mediastinum/Nodes: No mediastinal lymphadenopathy. Abnormal soft tissue in the left upper hilum is similar to prior PET-CT. Mass lesion identified on previous PET-CT not readily evident on today's CT scan given the adjacent left upper lobe collapse. Esophagus is diffusely fluid filled proximal to a moderate hiatal hernia which may be related to reflux or dysmotility. There is no axillary lymphadenopathy. Lungs/Pleura: Similar appearance marked volume loss left hemithorax right lung hyperexpanded. Emphysema noted  bilaterally. Small left pleural effusion evident. Musculoskeletal: Bones are diffusely demineralized. T10 compression fracture again noted. CT ABDOMEN PELVIS FINDINGS Hepatobiliary: Small area of low attenuation in the anterior liver, adjacent to the falciform ligament, is in a characteristic location for focal fatty deposition. Pneumobilia is compatible with prior sphincterotomy. Gallbladder surgically absent. Pancreas: No focal mass lesion. No dilatation of the main duct. No intraparenchymal cyst. No peripancreatic edema. Spleen: No splenomegaly. No focal mass lesion. Adrenals/Urinary Tract: Stable 7 mm left adrenal nodule. Right adrenal gland unremarkable. No suspicious enhancing lesion in either kidney. No hydronephrosis. No evidence for hydroureter. The urinary bladder appears normal for the degree of distention. Stomach/Bowel: Moderate hiatal hernia. Stomach otherwise unremarkable. Duodenum is normally positioned as is the ligament  of Treitz. No small bowel wall thickening. No small bowel dilatation. The terminal ileum is normal. The appendix is not visualized, but there is no edema or inflammation in the region of the cecum. No gross colonic mass. No colonic wall thickening. No substantial diverticular change. Vascular/Lymphatic: There is abdominal aortic atherosclerosis without aneurysm. There is no gastrohepatic or hepatoduodenal ligament lymphadenopathy. No intraperitoneal or retroperitoneal lymphadenopathy. No pelvic sidewall lymphadenopathy. Reproductive: The uterus has normal CT imaging appearance. Interval progression of right adnexal cystic mass now measuring 7.7 cm compared to 6.8 cm on the CT from 12/03/2016. This lesion showed no hypermetabolism on the previous PET-CT. Other: No intraperitoneal free fluid. Musculoskeletal: Diffuse bony demineralization. Status post screw placement in the right femoral neck. Bone windows reveal no worrisome lytic or sclerotic osseous lesions. Compression deformity  at L1, L4 and L5 is similar to prior. IMPRESSION: 1. Similar appearance to left upper lobe collapse with abnormal soft tissue in the left hilum when comparing to previous PET-CT. No evidence for mediastinal lymphadenopathy on the current study. 2. Persistent small left pleural effusion. 3. Stable 7 mm left adrenal nodule. 4. Slight interval progression of the large right adnexal cyst without hypermetabolism on the recent study. Benign neoplasm could have this appearance. 5. Compression deformity at T10, L1, L4, and L5, similar to prior. 6. Moderate hiatal hernia. 7.  Aortic Atherosclerois (ICD10-170.0) 8.  Emphysema. ([id number]) Electronically Signed   By: Misty Stanley M.D.   On: 04/23/2017 13:47   Ct Abdomen Pelvis W Contrast  Result Date: 04/23/2017 CLINICAL DATA:  Non-small-cell lung cancer. EXAM: CT CHEST, ABDOMEN, AND PELVIS WITH CONTRAST TECHNIQUE: Multidetector CT imaging of the chest, abdomen and pelvis was performed following the standard protocol during bolus administration of intravenous contrast. CONTRAST:  125mL ISOVUE-300 IOPAMIDOL (ISOVUE-300) INJECTION 61% COMPARISON:  PET-CT 01/28/2017. Chest CT 01/15/2017. Abdomen and pelvis CT 12/03/2016. FINDINGS: CT CHEST FINDINGS Cardiovascular: The heart size is normal. No pericardial effusion. Coronary artery calcification is evident. Atherosclerotic calcification is noted in the wall of the thoracic aorta. Mediastinum/Nodes: No mediastinal lymphadenopathy. Abnormal soft tissue in the left upper hilum is similar to prior PET-CT. Mass lesion identified on previous PET-CT not readily evident on today's CT scan given the adjacent left upper lobe collapse. Esophagus is diffusely fluid filled proximal to a moderate hiatal hernia which may be related to reflux or dysmotility. There is no axillary lymphadenopathy. Lungs/Pleura: Similar appearance marked volume loss left hemithorax right lung hyperexpanded. Emphysema noted bilaterally. Small left pleural  effusion evident. Musculoskeletal: Bones are diffusely demineralized. T10 compression fracture again noted. CT ABDOMEN PELVIS FINDINGS Hepatobiliary: Small area of low attenuation in the anterior liver, adjacent to the falciform ligament, is in a characteristic location for focal fatty deposition. Pneumobilia is compatible with prior sphincterotomy. Gallbladder surgically absent. Pancreas: No focal mass lesion. No dilatation of the main duct. No intraparenchymal cyst. No peripancreatic edema. Spleen: No splenomegaly. No focal mass lesion. Adrenals/Urinary Tract: Stable 7 mm left adrenal nodule. Right adrenal gland unremarkable. No suspicious enhancing lesion in either kidney. No hydronephrosis. No evidence for hydroureter. The urinary bladder appears normal for the degree of distention. Stomach/Bowel: Moderate hiatal hernia. Stomach otherwise unremarkable. Duodenum is normally positioned as is the ligament of Treitz. No small bowel wall thickening. No small bowel dilatation. The terminal ileum is normal. The appendix is not visualized, but there is no edema or inflammation in the region of the cecum. No gross colonic mass. No colonic wall thickening. No substantial diverticular change. Vascular/Lymphatic: There is  abdominal aortic atherosclerosis without aneurysm. There is no gastrohepatic or hepatoduodenal ligament lymphadenopathy. No intraperitoneal or retroperitoneal lymphadenopathy. No pelvic sidewall lymphadenopathy. Reproductive: The uterus has normal CT imaging appearance. Interval progression of right adnexal cystic mass now measuring 7.7 cm compared to 6.8 cm on the CT from 12/03/2016. This lesion showed no hypermetabolism on the previous PET-CT. Other: No intraperitoneal free fluid. Musculoskeletal: Diffuse bony demineralization. Status post screw placement in the right femoral neck. Bone windows reveal no worrisome lytic or sclerotic osseous lesions. Compression deformity at L1, L4 and L5 is similar to  prior. IMPRESSION: 1. Similar appearance to left upper lobe collapse with abnormal soft tissue in the left hilum when comparing to previous PET-CT. No evidence for mediastinal lymphadenopathy on the current study. 2. Persistent small left pleural effusion. 3. Stable 7 mm left adrenal nodule. 4. Slight interval progression of the large right adnexal cyst without hypermetabolism on the recent study. Benign neoplasm could have this appearance. 5. Compression deformity at T10, L1, L4, and L5, similar to prior. 6. Moderate hiatal hernia. 7.  Aortic Atherosclerois (ICD10-170.0) 8.  Emphysema. ([id number]) Electronically Signed   By: Misty Stanley M.D.   On: 04/23/2017 13:47     ASSESSMENT/PLAN:  Squamous cell lung cancer, left [facility]) This is a very pleasant 84 year old white female with a stage IV non-small cell lung cancer, squamous cell carcinoma. She is currently undergoing systemic chemotherapy with carboplatin and paclitaxel status post 4 cycles. The patient continues to tolerate this treatment fairly well with no significant adverse effects except for anemia and thrombocytopenia. CBC reviewed today.  Platelet count is only 21,000 and we cannot proceed with treatment as planned today.  We will delay her treatment by 1 week.  Port-A-Cath placement will remain on hold due to thrombocytopenia.  We will try to get this rescheduled for when her platelets recover.  The patient will return again next week for repeat labs and evaluation.  If her counts recover, will proceed with cycle 5 of her treatment at that time.  Instructions were written for [facility] to monitor for bleeding and to contact us if there is any evidence of bleeding.  The patient was advised to call immediately if she has any concerning symptoms in theinterval. The patient voices understanding of current disease status and treatment options and is in agreement with the current care plan.  All questions were answered. The  patient knows to call the clinic with any problems, questions or concerns. We can certainly see the patient much sooner if necessary.  No orders of the defined types were placed in this encounter.   Mikey Bussing, DNP, AGPCNP-BC, AOCNP 05/13/17

## 2017-05-13 NOTE — Telephone Encounter (Signed)
Printed avs and calender for upcoming appointment. Per 2/11 los 

## 2017-05-13 NOTE — Assessment & Plan Note (Addendum)
This is a very pleasant 84 year old white female with a stage IV non-small cell lung cancer, squamous cell carcinoma. She is currently undergoing systemic chemotherapy with carboplatin and paclitaxel status post 4 cycles. The patient continues to tolerate this treatment fairly well with no significant adverse effects except for anemia and thrombocytopenia. CBC reviewed today.  Platelet count is only 21,000 and we cannot proceed with treatment as planned today.  We will delay her treatment by 1 week.  Port-A-Cath placement will remain on hold due to thrombocytopenia.  We will try to get this rescheduled for when her platelets recover.  The patient will return again next week for repeat labs and evaluation.  If her counts recover, will proceed with cycle 5 of her treatment at that time.  Instructions were written for [facility] to monitor for bleeding and to contact us if there is any evidence of bleeding.  The patient was advised to call immediately if she has any concerning symptoms in theinterval. The patient voices understanding of current disease status and treatment options and is in agreement with the current care plan.  All questions were answered. The patient knows to call the clinic with any problems, questions or concerns. We can certainly see the patient much sooner if necessary.

## 2017-05-20 ENCOUNTER — Other Ambulatory Visit: Payer: Medicare Other

## 2017-05-21 ENCOUNTER — Inpatient Hospital Stay: Payer: Medicare Other

## 2017-05-21 ENCOUNTER — Encounter: Payer: Self-pay | Admitting: Oncology

## 2017-05-21 ENCOUNTER — Inpatient Hospital Stay (HOSPITAL_BASED_OUTPATIENT_CLINIC_OR_DEPARTMENT_OTHER): Payer: Medicare Other | Admitting: Oncology

## 2017-05-21 VITALS — BP 111/66 | HR 77 | Temp 98.0°F | Resp 16 | Ht 66.0 in | Wt 124.7 lb

## 2017-05-21 DIAGNOSIS — D6481 Anemia due to antineoplastic chemotherapy: Secondary | ICD-10-CM | POA: Diagnosis not present

## 2017-05-21 DIAGNOSIS — N183 Chronic kidney disease, stage 3 (moderate): Secondary | ICD-10-CM

## 2017-05-21 DIAGNOSIS — C3402 Malignant neoplasm of left main bronchus: Secondary | ICD-10-CM | POA: Diagnosis not present

## 2017-05-21 DIAGNOSIS — C3492 Malignant neoplasm of unspecified part of left bronchus or lung: Secondary | ICD-10-CM

## 2017-05-21 DIAGNOSIS — D6959 Other secondary thrombocytopenia: Secondary | ICD-10-CM

## 2017-05-21 DIAGNOSIS — Z5111 Encounter for antineoplastic chemotherapy: Secondary | ICD-10-CM

## 2017-05-21 DIAGNOSIS — C3412 Malignant neoplasm of upper lobe, left bronchus or lung: Secondary | ICD-10-CM

## 2017-05-21 LAB — COMPREHENSIVE METABOLIC PANEL
ALT: 7 U/L (ref 0–55)
ANION GAP: 10 (ref 3–11)
AST: 14 U/L (ref 5–34)
Albumin: 3.4 g/dL — ABNORMAL LOW (ref 3.5–5.0)
Alkaline Phosphatase: 65 U/L (ref 40–150)
BUN: 22 mg/dL (ref 7–26)
CHLORIDE: 97 mmol/L — AB (ref 98–109)
CO2: 31 mmol/L — AB (ref 22–29)
CREATININE: 1.11 mg/dL — AB (ref 0.60–1.10)
Calcium: 10 mg/dL (ref 8.4–10.4)
GFR calc non Af Amer: 45 mL/min — ABNORMAL LOW (ref >60)
GFR, EST AFRICAN AMERICAN: 53 mL/min — AB (ref >60)
Glucose, Bld: 91 mg/dL (ref 70–140)
POTASSIUM: 4.1 mmol/L (ref 3.5–5.1)
SODIUM: 138 mmol/L (ref 136–145)
Total Bilirubin: 0.3 mg/dL (ref 0.2–1.2)
Total Protein: 7.1 g/dL (ref 6.4–8.3)

## 2017-05-21 LAB — CBC WITH DIFFERENTIAL/PLATELET
Basophils Absolute: 0 10*3/uL (ref 0.0–0.1)
Basophils Relative: 0 %
Eosinophils Absolute: 0 10*3/uL (ref 0.0–0.5)
Eosinophils Relative: 1 %
HEMATOCRIT: 29.9 % — AB (ref 34.8–46.6)
HEMOGLOBIN: 9.5 g/dL — AB (ref 11.6–15.9)
LYMPHS ABS: 0.8 10*3/uL — AB (ref 0.9–3.3)
LYMPHS PCT: 14 %
MCH: 31.1 pg (ref 25.1–34.0)
MCHC: 31.8 g/dL (ref 31.5–36.0)
MCV: 98 fL (ref 79.5–101.0)
MONOS PCT: 11 %
Monocytes Absolute: 0.6 10*3/uL (ref 0.1–0.9)
NEUTROS ABS: 4.1 10*3/uL (ref 1.5–6.5)
NEUTROS PCT: 74 %
Platelets: 152 10*3/uL (ref 145–400)
RBC: 3.05 MIL/uL — ABNORMAL LOW (ref 3.70–5.45)
RDW: 20.8 % — ABNORMAL HIGH (ref 11.2–14.5)
WBC: 5.5 10*3/uL (ref 3.9–10.3)

## 2017-05-21 MED ORDER — SODIUM CHLORIDE 0.9 % IV SOLN
Freq: Once | INTRAVENOUS | Status: AC
  Administered 2017-05-21: 11:00:00 via INTRAVENOUS

## 2017-05-21 MED ORDER — PEGFILGRASTIM 6 MG/0.6ML ~~LOC~~ PSKT
6.0000 mg | PREFILLED_SYRINGE | Freq: Once | SUBCUTANEOUS | Status: AC
  Administered 2017-05-21: 6 mg via SUBCUTANEOUS

## 2017-05-21 MED ORDER — PALONOSETRON HCL INJECTION 0.25 MG/5ML
INTRAVENOUS | Status: AC
  Filled 2017-05-21: qty 5

## 2017-05-21 MED ORDER — SODIUM CHLORIDE 0.9 % IV SOLN
175.0000 mg/m2 | Freq: Once | INTRAVENOUS | Status: AC
  Administered 2017-05-21: 294 mg via INTRAVENOUS
  Filled 2017-05-21: qty 49

## 2017-05-21 MED ORDER — SODIUM CHLORIDE 0.9 % IV SOLN
20.0000 mg | Freq: Once | INTRAVENOUS | Status: AC
  Administered 2017-05-21: 20 mg via INTRAVENOUS
  Filled 2017-05-21: qty 2

## 2017-05-21 MED ORDER — PALONOSETRON HCL INJECTION 0.25 MG/5ML
0.2500 mg | Freq: Once | INTRAVENOUS | Status: AC
  Administered 2017-05-21: 0.25 mg via INTRAVENOUS

## 2017-05-21 MED ORDER — DIPHENHYDRAMINE HCL 50 MG/ML IJ SOLN
INTRAMUSCULAR | Status: AC
  Filled 2017-05-21: qty 1

## 2017-05-21 MED ORDER — PEGFILGRASTIM 6 MG/0.6ML ~~LOC~~ PSKT
PREFILLED_SYRINGE | SUBCUTANEOUS | Status: AC
  Filled 2017-05-21: qty 0.6

## 2017-05-21 MED ORDER — DIPHENHYDRAMINE HCL 50 MG/ML IJ SOLN
50.0000 mg | Freq: Once | INTRAMUSCULAR | Status: AC
  Administered 2017-05-21: 50 mg via INTRAVENOUS

## 2017-05-21 MED ORDER — FAMOTIDINE IN NACL 20-0.9 MG/50ML-% IV SOLN
40.0000 mg | Freq: Once | INTRAVENOUS | Status: DC
  Filled 2017-05-21: qty 100

## 2017-05-21 MED ORDER — SODIUM CHLORIDE 0.9 % IV SOLN
317.0000 mg | Freq: Once | INTRAVENOUS | Status: AC
  Administered 2017-05-21: 320 mg via INTRAVENOUS
  Filled 2017-05-21: qty 32

## 2017-05-21 NOTE — Assessment & Plan Note (Signed)
This is a very pleasant 84 year old white female with a stage IV non-small cell lung cancer, squamous cell carcinoma. She is currently undergoing systemic chemotherapy with carboplatin and paclitaxel status post4cycles. The patient continues to tolerate this treatment fairly well with no significant adverse effects except for anemia and thrombocytopenia. CBC was reviewed and counts are adequate for treatment today.  She will proceed with cycle 5 of her chemotherapy today as scheduled. She will continue to have weekly labs at collapse which will be faxed to our attention. I have reordered a Port-A-Cath placement for the patient due to poor IV access.  Hopefully this can be done later this week before her counts drop again.  The patient will follow up with [place] in 3 weeks for evaluation prior to cycle #6 of her treatment.  The patient was advised to call immediately if she has any concerning symptoms in theinterval. The patient voices understanding of current disease status and treatment options and is in agreement with the current care plan.  All questions were answered. The patient knows to call the clinic with any problems, questions or concerns. We can certainly see the patient much sooner if necessary.

## 2017-05-21 NOTE — Progress Notes (Signed)
[facility] OFFICE PROGRESS NOTE  Leonard Downing, MD [place]  [id number]  DIAGNOSIS: Stage IV open (T3, N1, M1a) metastatic non-small cell lung cancer,squamous cell carcinoma presented with large central obstructive left hilar mass with hilar level at The patient left pleural effusion diagnosed in September 2018.  PRIOR THERAPY: Palliative radiotherapy to the left mainstem bronchus for a total dose of 20 GY in 5 fractions completed on 12/14/2016.  CURRENT THERAPY: Palliative systemic chemotherapy with carboplatin for an AUC of 5 and paclitaxel 175 mg/m every 3 weeks with Neulasta support. First dose 02/13/2017.Status post4cycles.  INTERVAL HISTORY: Paula Contreras 84 y.o. female returns for routine follow-up visit by herself.  The patient has no specific complaints today.  Her chemotherapy was held last week due to thrombocytopenia the patient denies fevers and chills.  Denies chest pain, shortness breath at rest, cough, hemoptysis.  She has her baseline shortness of breath with exertion and wears home oxygen.  She denies nausea, vomiting, constipation, diarrhea.  The patient is here for evaluation prior to starting cycle #5 of her treatment.  MEDICAL HISTORY: Past Medical History:  Diagnosis Date  . Anemia 02/20/2015  . Aortic stenosis    a. 2D Echo 07/2013: EF 60-65%, no RWMA, grade 1 DD, normal LV filling pressure, moderate AS, mild TR, PASP 75mmHg.  . Bradycardia    a. possible bradycardia with HR 44 by physical exam notes at nephrologist office in 03/2014. F/u event monitor showed NSR PACs average HR 81, bradycardia <1% of readable data, no pauses of 3 seconds or longer..  . Cataract   . Chronic respiratory failure ([place])    a. On home O2 since 2012.  . CKD (chronic kidney disease), stage III ([place])   . COPD (chronic obstructive pulmonary disease) ([place])   . Dementia   . Hypertension   . Osteoporosis   . Other psoriasis and similar  disorders    psoriasis vs eczema  . Paroxysmal atrial fibrillation ([place])    a. Dx 07/2013. No attempts per Epic to restore NSR but in NSR 2016.  Marland Kitchen Pneumonia 03/2016  . Skin cancer     ALLERGIES:  is allergic to codeine and robitussin (alcohol free) [guaifenesin].  MEDICATIONS:  Current Outpatient Medications  Medication Sig Dispense Refill  . acetaminophen (TYLENOL) 325 MG tablet Take 650 mg by mouth every 6 (six) hours as needed (for pain).     Marland Kitchen albuterol (PROAIR HFA) 108 (90 BASE) MCG/ACT inhaler Inhale 2 puffs into the lungs every 4 (four) hours as needed for wheezing or shortness of breath. 1 Inhaler 3  . alendronate (FOSAMAX) 70 MG tablet Take 70 mg by mouth every Monday. Take with a full glass of water on an empty stomach.    . benzonatate (TESSALON) 100 MG capsule Take 1 capsule (100 mg total) by mouth 3 (three) times daily as needed for cough. 20 capsule 0  . calcium carbonate (OS-CAL) 600 MG TABS tablet Take 2 tablets by mouth daily with breakfast.     . carvedilol (COREG) 3.125 MG tablet     . Cholecalciferol (VITAMIN D) 2000 UNITS CAPS Take 2,000 Units by mouth daily with breakfast.     . esomeprazole (NEXIUM) 20 MG capsule Take 20 mg by mouth daily at 12 noon.    . feeding supplement (BOOST / RESOURCE BREEZE) LIQD Take 1 Container by mouth every evening.     . hydrocortisone 2.5 % cream     . ipratropium-albuterol (DUONEB)  0.5-2.5 (3) MG/3ML SOLN     . levalbuterol (XOPENEX) 0.63 MG/3ML nebulizer solution Take 3 mLs (0.63 mg total) by nebulization every 6 (six) hours as needed for wheezing or shortness of breath.    . Multiple Vitamins-Iron (MULTIVITAMIN/IRON PO) Take 1 tablet by mouth daily.    . ondansetron (ZOFRAN-ODT) 4 MG disintegrating tablet Take 1 tablet by mouth every 8 (eight) hours as needed.    . OXYGEN Inhale 2-4 L into the lungs continuous. To keep O2 stats >90%    . senna-docusate (SENOKOT-S) 8.6-50 MG tablet Take 1 tablet by mouth at bedtime as needed for mild  constipation.    . Tiotropium Bromide Monohydrate (SPIRIVA RESPIMAT) 2.5 MCG/ACT AERS Inhale 2 puffs into the lungs daily.    . fluticasone (FLONASE) 50 MCG/ACT nasal spray Place 1 spray into both nostrils daily. (Patient not taking: Reported on 05/13/2017)  2  . prochlorperazine (COMPAZINE) 10 MG tablet Take 1 tablet (10 mg total) every 6 (six) hours as needed by mouth for nausea or vomiting. (Patient not taking: Reported on 05/13/2017) 30 tablet 0  . XARELTO 20 MG TABS tablet      No current facility-administered medications for this visit.     SURGICAL HISTORY:  Past Surgical History:  Procedure Laterality Date  . BREAST SURGERY Right 1964   tumor removed  . CATARACT EXTRACTION Bilateral   . ERCP N/A 02/29/2016   Procedure: ENDOSCOPIC RETROGRADE CHOLANGIOPANCREATOGRAPHY (ERCP);  Surgeon: Irene Shipper, MD;  Location: [facility];  Service: Endoscopy;  Laterality: N/A;  . HIP SURGERY Right    fracture  . VIDEO BRONCHOSCOPY Bilateral 12/04/2016   Procedure: VIDEO BRONCHOSCOPY WITHOUT FLUORO;  Surgeon: Collene Gobble, MD;  Location: [facility] ENDOSCOPY;  Service: Cardiopulmonary;  Laterality: Bilateral;    REVIEW OF SYSTEMS:   Review of Systems  Constitutional: Negative for appetite change, chills, fatigue, fever and unexpected weight change.  HENT:   Negative for mouth sores, nosebleeds, sore throat and trouble swallowing.   Eyes: Negative for eye problems and icterus.  Respiratory: Negative for cough, hemoptysis, shortness of breath at rest and wheezing.  Positive for shortness of breath with exertion.  Cardiovascular: Negative for chest pain and leg swelling.  Gastrointestinal: Negative for abdominal pain, constipation, diarrhea, nausea and vomiting.  Genitourinary: Negative for bladder incontinence, difficulty urinating, dysuria, frequency and hematuria.   Musculoskeletal: Negative for back pain, gait problem, neck pain and neck stiffness.  Skin: Negative for itching and rash.   Neurological: Negative for dizziness, extremity weakness, headaches, light-headedness and seizures.  Hematological: Negative for adenopathy. Does not bruise/bleed easily.  Psychiatric/Behavioral: Negative for confusion, depression and sleep disturbance. The patient is not nervous/anxious.     PHYSICAL EXAMINATION:  Blood pressure 111/66, pulse 77, temperature 98 F (36.7 C), temperature source Oral, resp. rate 16, height 5\' 6"  (1.676 m), weight 124 lb 11.2 oz (56.6 kg), SpO2 92 %.  ECOG PERFORMANCE STATUS: 1 - Symptomatic but completely ambulatory  Physical Exam  Constitutional: Oriented to person, place, and time and well-developed, well-nourished, and in no distress. No distress.  HENT:  Head: Normocephalic and atraumatic.  Mouth/Throat: Oropharynx is clear and moist. No oropharyngeal exudate.  Eyes: Conjunctivae are normal. Right eye exhibits no discharge. Left eye exhibits no discharge. No scleral icterus.  Neck: Normal range of motion. Neck supple.  Cardiovascular: Normal rate, regular rhythm, normal heart sounds and intact distal pulses.   Pulmonary/Chest: Effort normal and breath sounds normal. No respiratory distress. No wheezes. No rales.  Abdominal:  Soft. Bowel sounds are normal. Exhibits no distension and no mass. There is no tenderness.  Musculoskeletal: Normal range of motion. Exhibits no edema.  Lymphadenopathy:    No cervical adenopathy.  Neurological: Alert and oriented to person, place, and time. Exhibits normal muscle tone. Coordination normal.  Skin: Skin is warm and dry. No rash noted. Not diaphoretic. No erythema. No pallor.  Psychiatric: Mood, memory and judgment normal.  Vitals reviewed.  LABORATORY DATA: Lab Results  Component Value Date   WBC 5.5 05/21/2017   HGB 9.5 (L) 05/21/2017   HCT 29.9 (L) 05/21/2017   MCV 98.0 05/21/2017   PLT 152 05/21/2017      Chemistry      Component Value Date/Time   NA 138 05/21/2017 0848   NA 140 04/01/2017 0738    K 4.1 05/21/2017 0848   K 4.5 04/01/2017 0738   CL 97 (L) 05/21/2017 0848   CO2 31 (H) 05/21/2017 0848   CO2 33 (H) 04/01/2017 0738   BUN 22 05/21/2017 0848   BUN 16.3 04/01/2017 0738   CREATININE 1.11 (H) 05/21/2017 0848   CREATININE 1.0 04/01/2017 0738      Component Value Date/Time   CALCIUM 10.0 05/21/2017 0848   CALCIUM 9.5 04/01/2017 0738   ALKPHOS 65 05/21/2017 0848   ALKPHOS 66 04/01/2017 0738   AST 14 05/21/2017 0848   AST 18 04/01/2017 0738   ALT 7 05/21/2017 0848   ALT 14 04/01/2017 0738   BILITOT 0.3 05/21/2017 0848   BILITOT 0.22 04/01/2017 0738       RADIOGRAPHIC STUDIES:  Ct Chest W Contrast  Result Date: 04/23/2017 CLINICAL DATA:  Non-small-cell lung cancer. EXAM: CT CHEST, ABDOMEN, AND PELVIS WITH CONTRAST TECHNIQUE: Multidetector CT imaging of the chest, abdomen and pelvis was performed following the standard protocol during bolus administration of intravenous contrast. CONTRAST:  180mL ISOVUE-300 IOPAMIDOL (ISOVUE-300) INJECTION 61% COMPARISON:  PET-CT 01/28/2017. Chest CT 01/15/2017. Abdomen and pelvis CT 12/03/2016. FINDINGS: CT CHEST FINDINGS Cardiovascular: The heart size is normal. No pericardial effusion. Coronary artery calcification is evident. Atherosclerotic calcification is noted in the wall of the thoracic aorta. Mediastinum/Nodes: No mediastinal lymphadenopathy. Abnormal soft tissue in the left upper hilum is similar to prior PET-CT. Mass lesion identified on previous PET-CT not readily evident on today's CT scan given the adjacent left upper lobe collapse. Esophagus is diffusely fluid filled proximal to a moderate hiatal hernia which may be related to reflux or dysmotility. There is no axillary lymphadenopathy. Lungs/Pleura: Similar appearance marked volume loss left hemithorax right lung hyperexpanded. Emphysema noted bilaterally. Small left pleural effusion evident. Musculoskeletal: Bones are diffusely demineralized. T10 compression fracture again  noted. CT ABDOMEN PELVIS FINDINGS Hepatobiliary: Small area of low attenuation in the anterior liver, adjacent to the falciform ligament, is in a characteristic location for focal fatty deposition. Pneumobilia is compatible with prior sphincterotomy. Gallbladder surgically absent. Pancreas: No focal mass lesion. No dilatation of the main duct. No intraparenchymal cyst. No peripancreatic edema. Spleen: No splenomegaly. No focal mass lesion. Adrenals/Urinary Tract: Stable 7 mm left adrenal nodule. Right adrenal gland unremarkable. No suspicious enhancing lesion in either kidney. No hydronephrosis. No evidence for hydroureter. The urinary bladder appears normal for the degree of distention. Stomach/Bowel: Moderate hiatal hernia. Stomach otherwise unremarkable. Duodenum is normally positioned as is the ligament of Treitz. No small bowel wall thickening. No small bowel dilatation. The terminal ileum is normal. The appendix is not visualized, but there is no edema or inflammation in the region of the  cecum. No gross colonic mass. No colonic wall thickening. No substantial diverticular change. Vascular/Lymphatic: There is abdominal aortic atherosclerosis without aneurysm. There is no gastrohepatic or hepatoduodenal ligament lymphadenopathy. No intraperitoneal or retroperitoneal lymphadenopathy. No pelvic sidewall lymphadenopathy. Reproductive: The uterus has normal CT imaging appearance. Interval progression of right adnexal cystic mass now measuring 7.7 cm compared to 6.8 cm on the CT from 12/03/2016. This lesion showed no hypermetabolism on the previous PET-CT. Other: No intraperitoneal free fluid. Musculoskeletal: Diffuse bony demineralization. Status post screw placement in the right femoral neck. Bone windows reveal no worrisome lytic or sclerotic osseous lesions. Compression deformity at L1, L4 and L5 is similar to prior. IMPRESSION: 1. Similar appearance to left upper lobe collapse with abnormal soft tissue in the  left hilum when comparing to previous PET-CT. No evidence for mediastinal lymphadenopathy on the current study. 2. Persistent small left pleural effusion. 3. Stable 7 mm left adrenal nodule. 4. Slight interval progression of the large right adnexal cyst without hypermetabolism on the recent study. Benign neoplasm could have this appearance. 5. Compression deformity at T10, L1, L4, and L5, similar to prior. 6. Moderate hiatal hernia. 7.  Aortic Atherosclerois (ICD10-170.0) 8.  Emphysema. ([id number]) Electronically Signed   By: Misty Stanley M.D.   On: 04/23/2017 13:47   Ct Abdomen Pelvis W Contrast  Result Date: 04/23/2017 CLINICAL DATA:  Non-small-cell lung cancer. EXAM: CT CHEST, ABDOMEN, AND PELVIS WITH CONTRAST TECHNIQUE: Multidetector CT imaging of the chest, abdomen and pelvis was performed following the standard protocol during bolus administration of intravenous contrast. CONTRAST:  167mL ISOVUE-300 IOPAMIDOL (ISOVUE-300) INJECTION 61% COMPARISON:  PET-CT 01/28/2017. Chest CT 01/15/2017. Abdomen and pelvis CT 12/03/2016. FINDINGS: CT CHEST FINDINGS Cardiovascular: The heart size is normal. No pericardial effusion. Coronary artery calcification is evident. Atherosclerotic calcification is noted in the wall of the thoracic aorta. Mediastinum/Nodes: No mediastinal lymphadenopathy. Abnormal soft tissue in the left upper hilum is similar to prior PET-CT. Mass lesion identified on previous PET-CT not readily evident on today's CT scan given the adjacent left upper lobe collapse. Esophagus is diffusely fluid filled proximal to a moderate hiatal hernia which may be related to reflux or dysmotility. There is no axillary lymphadenopathy. Lungs/Pleura: Similar appearance marked volume loss left hemithorax right lung hyperexpanded. Emphysema noted bilaterally. Small left pleural effusion evident. Musculoskeletal: Bones are diffusely demineralized. T10 compression fracture again noted. CT ABDOMEN PELVIS FINDINGS  Hepatobiliary: Small area of low attenuation in the anterior liver, adjacent to the falciform ligament, is in a characteristic location for focal fatty deposition. Pneumobilia is compatible with prior sphincterotomy. Gallbladder surgically absent. Pancreas: No focal mass lesion. No dilatation of the main duct. No intraparenchymal cyst. No peripancreatic edema. Spleen: No splenomegaly. No focal mass lesion. Adrenals/Urinary Tract: Stable 7 mm left adrenal nodule. Right adrenal gland unremarkable. No suspicious enhancing lesion in either kidney. No hydronephrosis. No evidence for hydroureter. The urinary bladder appears normal for the degree of distention. Stomach/Bowel: Moderate hiatal hernia. Stomach otherwise unremarkable. Duodenum is normally positioned as is the ligament of Treitz. No small bowel wall thickening. No small bowel dilatation. The terminal ileum is normal. The appendix is not visualized, but there is no edema or inflammation in the region of the cecum. No gross colonic mass. No colonic wall thickening. No substantial diverticular change. Vascular/Lymphatic: There is abdominal aortic atherosclerosis without aneurysm. There is no gastrohepatic or hepatoduodenal ligament lymphadenopathy. No intraperitoneal or retroperitoneal lymphadenopathy. No pelvic sidewall lymphadenopathy. Reproductive: The uterus has normal CT imaging appearance. Interval progression of  right adnexal cystic mass now measuring 7.7 cm compared to 6.8 cm on the CT from 12/03/2016. This lesion showed no hypermetabolism on the previous PET-CT. Other: No intraperitoneal free fluid. Musculoskeletal: Diffuse bony demineralization. Status post screw placement in the right femoral neck. Bone windows reveal no worrisome lytic or sclerotic osseous lesions. Compression deformity at L1, L4 and L5 is similar to prior. IMPRESSION: 1. Similar appearance to left upper lobe collapse with abnormal soft tissue in the left hilum when comparing to  previous PET-CT. No evidence for mediastinal lymphadenopathy on the current study. 2. Persistent small left pleural effusion. 3. Stable 7 mm left adrenal nodule. 4. Slight interval progression of the large right adnexal cyst without hypermetabolism on the recent study. Benign neoplasm could have this appearance. 5. Compression deformity at T10, L1, L4, and L5, similar to prior. 6. Moderate hiatal hernia. 7.  Aortic Atherosclerois (ICD10-170.0) 8.  Emphysema. ([id number]) Electronically Signed   By: Misty Stanley M.D.   On: 04/23/2017 13:47     ASSESSMENT/PLAN:  Squamous cell lung cancer, left [facility]) This is a very pleasant 84 year old white female with a stage IV non-small cell lung cancer, squamous cell carcinoma. She is currently undergoing systemic chemotherapy with carboplatin and paclitaxel status post4cycles. The patient continues to tolerate this treatment fairly well with no significant adverse effects except for anemia and thrombocytopenia. CBC was reviewed and counts are adequate for treatment today.  She will proceed with cycle 5 of her chemotherapy today as scheduled. She will continue to have weekly labs at collapse which will be faxed to our attention. I have reordered a Port-A-Cath placement for the patient due to poor IV access.  Hopefully this can be done later this week before her counts drop again.  The patient will follow up with [place] in 3 weeks for evaluation prior to cycle #6 of her treatment.  The patient was advised to call immediately if she has any concerning symptoms in theinterval. The patient voices understanding of current disease status and treatment options and is in agreement with the current care plan.  All questions were answered. The patient knows to call the clinic with any problems, questions or concerns. We can certainly see the patient much sooner if necessary.  Orders Placed This Encounter  Procedures  . IR Fluoro Guide CV Line Right    Pt lives at  [facility]. Call facility to arrange date/time. [phone]    Standing Status:   Future    Standing Expiration Date:   07/20/2018    Order Specific Question:   Reason for exam:    Answer:   PAC placement for Chemotherapy.    Order Specific Question:   Preferred Imaging Location?    Answer:   [place], [place], AGPCNP-BC, [place] 05/21/17

## 2017-05-22 ENCOUNTER — Telehealth: Payer: Self-pay | Admitting: Oncology

## 2017-05-22 NOTE — Telephone Encounter (Signed)
Appts already scheduled per 2/19 los - no additional appts to schedule per tx plan

## 2017-05-27 ENCOUNTER — Other Ambulatory Visit: Payer: Medicare Other

## 2017-05-28 ENCOUNTER — Telehealth: Payer: Self-pay | Admitting: Medical Oncology

## 2017-05-28 ENCOUNTER — Other Ambulatory Visit: Payer: Self-pay | Admitting: Medical Oncology

## 2017-05-28 DIAGNOSIS — D649 Anemia, unspecified: Secondary | ICD-10-CM

## 2017-05-28 NOTE — Telephone Encounter (Signed)
Per Julien Nordmann pt needs blood transfusion this week. Schedule request sent and orders.

## 2017-05-29 ENCOUNTER — Other Ambulatory Visit: Payer: Self-pay

## 2017-05-29 ENCOUNTER — Inpatient Hospital Stay: Payer: Medicare Other

## 2017-05-29 ENCOUNTER — Other Ambulatory Visit: Payer: Self-pay | Admitting: Medical Oncology

## 2017-05-29 DIAGNOSIS — D649 Anemia, unspecified: Secondary | ICD-10-CM

## 2017-05-29 DIAGNOSIS — Z5111 Encounter for antineoplastic chemotherapy: Secondary | ICD-10-CM | POA: Diagnosis not present

## 2017-05-29 DIAGNOSIS — C3492 Malignant neoplasm of unspecified part of left bronchus or lung: Secondary | ICD-10-CM

## 2017-05-29 LAB — COMPREHENSIVE METABOLIC PANEL
ALK PHOS: 72 U/L (ref 40–150)
ALT: 11 U/L (ref 0–55)
AST: 14 U/L (ref 5–34)
Albumin: 3.4 g/dL — ABNORMAL LOW (ref 3.5–5.0)
Anion gap: 10 (ref 3–11)
BUN: 23 mg/dL (ref 7–26)
CALCIUM: 10 mg/dL (ref 8.4–10.4)
CHLORIDE: 96 mmol/L — AB (ref 98–109)
CO2: 27 mmol/L (ref 22–29)
Creatinine, Ser: 0.95 mg/dL (ref 0.60–1.10)
GFR calc Af Amer: >60 mL/min (ref >60)
GFR, EST NON AFRICAN AMERICAN: 55 mL/min — AB (ref >60)
Glucose, Bld: 97 mg/dL (ref 70–140)
Potassium: 5.3 mmol/L — ABNORMAL HIGH (ref 3.5–5.1)
SODIUM: 133 mmol/L — AB (ref 136–145)
Total Bilirubin: 0.5 mg/dL (ref 0.2–1.2)
Total Protein: 7 g/dL (ref 6.4–8.3)

## 2017-05-29 LAB — CBC WITH DIFFERENTIAL/PLATELET
Basophils Absolute: 0 10*3/uL (ref 0.0–0.1)
Basophils Relative: 0 %
EOS PCT: 1 %
Eosinophils Absolute: 0 10*3/uL (ref 0.0–0.5)
HCT: 25.1 % — ABNORMAL LOW (ref 34.8–46.6)
Hemoglobin: 8.3 g/dL — ABNORMAL LOW (ref 11.6–15.9)
LYMPHS ABS: 0.4 10*3/uL — AB (ref 0.9–3.3)
Lymphocytes Relative: 19 %
MCH: 32.2 pg (ref 25.1–34.0)
MCHC: 33.1 g/dL (ref 31.5–36.0)
MCV: 97.3 fL (ref 79.5–101.0)
MONOS PCT: 27 %
Monocytes Absolute: 0.5 10*3/uL (ref 0.1–0.9)
Neutro Abs: 1.1 10*3/uL — ABNORMAL LOW (ref 1.5–6.5)
Neutrophils Relative %: 53 %
PLATELETS: 38 10*3/uL — AB (ref 145–400)
RBC: 2.58 MIL/uL — ABNORMAL LOW (ref 3.70–5.45)
RDW: 19.4 % — AB (ref 11.2–14.5)
WBC: 2 10*3/uL — ABNORMAL LOW (ref 3.9–10.3)

## 2017-05-29 LAB — PREPARE RBC (CROSSMATCH)

## 2017-05-29 MED ORDER — ACETAMINOPHEN 325 MG PO TABS
650.0000 mg | ORAL_TABLET | Freq: Once | ORAL | Status: AC
  Administered 2017-05-29: 650 mg via ORAL

## 2017-05-29 MED ORDER — DIPHENHYDRAMINE HCL 25 MG PO CAPS
25.0000 mg | ORAL_CAPSULE | Freq: Once | ORAL | Status: AC
  Administered 2017-05-29: 25 mg via ORAL

## 2017-05-29 MED ORDER — SODIUM CHLORIDE 0.9 % IV SOLN
250.0000 mL | Freq: Once | INTRAVENOUS | Status: AC
  Administered 2017-05-29: 250 mL via INTRAVENOUS

## 2017-05-29 MED ORDER — DIPHENHYDRAMINE HCL 25 MG PO CAPS
ORAL_CAPSULE | ORAL | Status: AC
  Filled 2017-05-29: qty 1

## 2017-05-29 MED ORDER — ACETAMINOPHEN 325 MG PO TABS: 650.0000 mg | ORAL_TABLET | Freq: Once | ORAL | Status: AC

## 2017-05-29 MED ORDER — ACETAMINOPHEN 325 MG PO TABS
ORAL_TABLET | ORAL | Status: AC
  Filled 2017-05-29: qty 2

## 2017-05-29 NOTE — Patient Instructions (Signed)

## 2017-05-29 NOTE — Progress Notes (Signed)
This RN offered to take pt to the bathroom before, during and after transfusion. Pt refused all 3 times.

## 2017-05-30 ENCOUNTER — Other Ambulatory Visit: Payer: Self-pay | Admitting: Radiology

## 2017-05-30 LAB — BPAM RBC
BLOOD PRODUCT EXPIRATION DATE: 201903122359
ISSUE DATE / TIME: 201902271440
UNIT TYPE AND RH: 6200

## 2017-05-30 LAB — TYPE AND SCREEN
ABO/RH(D): A POS
ANTIBODY SCREEN: NEGATIVE
Unit division: 0

## 2017-05-31 ENCOUNTER — Ambulatory Visit (HOSPITAL_COMMUNITY)
Admission: RE | Admit: 2017-05-31 | Discharge: 2017-05-31 | Disposition: A | Discharge status: Home / Self Care | Payer: Medicare Other | Source: Ambulatory Visit | Attending: Internal Medicine | Admitting: Internal Medicine

## 2017-05-31 ENCOUNTER — Ambulatory Visit (HOSPITAL_COMMUNITY)
Admission: RE | Admit: 2017-05-31 | Discharge: 2017-05-31 | Disposition: A | Discharge status: Home / Self Care | Payer: Medicare Other | Source: Ambulatory Visit | Attending: Oncology | Admitting: Oncology

## 2017-05-31 ENCOUNTER — Encounter (HOSPITAL_COMMUNITY): Payer: Self-pay

## 2017-05-31 DIAGNOSIS — N183 Chronic kidney disease, stage 3 (moderate): Secondary | ICD-10-CM | POA: Diagnosis not present

## 2017-05-31 DIAGNOSIS — I129 Hypertensive chronic kidney disease with stage 1 through stage 4 chronic kidney disease, or unspecified chronic kidney disease: Secondary | ICD-10-CM | POA: Diagnosis not present

## 2017-05-31 DIAGNOSIS — F0390 Unspecified dementia without behavioral disturbance: Secondary | ICD-10-CM | POA: Insufficient documentation

## 2017-05-31 DIAGNOSIS — Z9981 Dependence on supplemental oxygen: Secondary | ICD-10-CM | POA: Insufficient documentation

## 2017-05-31 DIAGNOSIS — Z885 Allergy status to narcotic agent status: Secondary | ICD-10-CM | POA: Insufficient documentation

## 2017-05-31 DIAGNOSIS — Z7951 Long term (current) use of inhaled steroids: Secondary | ICD-10-CM | POA: Insufficient documentation

## 2017-05-31 DIAGNOSIS — Z7901 Long term (current) use of anticoagulants: Secondary | ICD-10-CM | POA: Insufficient documentation

## 2017-05-31 DIAGNOSIS — I480 Paroxysmal atrial fibrillation: Secondary | ICD-10-CM | POA: Insufficient documentation

## 2017-05-31 DIAGNOSIS — J9610 Chronic respiratory failure, unspecified whether with hypoxia or hypercapnia: Secondary | ICD-10-CM | POA: Diagnosis not present

## 2017-05-31 DIAGNOSIS — Z5111 Encounter for antineoplastic chemotherapy: Secondary | ICD-10-CM

## 2017-05-31 DIAGNOSIS — Z5309 Procedure and treatment not carried out because of other contraindication: Secondary | ICD-10-CM | POA: Diagnosis not present

## 2017-05-31 DIAGNOSIS — Z87891 Personal history of nicotine dependence: Secondary | ICD-10-CM | POA: Insufficient documentation

## 2017-05-31 DIAGNOSIS — C3490 Malignant neoplasm of unspecified part of unspecified bronchus or lung: Secondary | ICD-10-CM | POA: Insufficient documentation

## 2017-05-31 DIAGNOSIS — I350 Nonrheumatic aortic (valve) stenosis: Secondary | ICD-10-CM | POA: Diagnosis not present

## 2017-05-31 DIAGNOSIS — J449 Chronic obstructive pulmonary disease, unspecified: Secondary | ICD-10-CM | POA: Diagnosis not present

## 2017-05-31 LAB — CBC WITH DIFFERENTIAL/PLATELET
BASOS PCT: 1 %
Band Neutrophils: 0 %
Basophils Absolute: 0.1 10*3/uL (ref 0.0–0.1)
Blasts: 0 %
Eosinophils Absolute: 0.2 10*3/uL (ref 0.0–0.7)
Eosinophils Relative: 2 %
HEMATOCRIT: 29.5 % — AB (ref 36.0–46.0)
HEMOGLOBIN: 9.7 g/dL — AB (ref 12.0–15.0)
Lymphocytes Relative: 8 %
Lymphs Abs: 0.7 10*3/uL (ref 0.7–4.0)
MCH: 31.9 pg (ref 26.0–34.0)
MCHC: 32.9 g/dL (ref 30.0–36.0)
MCV: 97 fL (ref 78.0–100.0)
MONO ABS: 0.8 10*3/uL (ref 0.1–1.0)
MYELOCYTES: 0 %
Metamyelocytes Relative: 1 %
Monocytes Relative: 9 %
NEUTROS PCT: 79 %
NRBC: 0 /100{WBCs}
Neutro Abs: 7.2 10*3/uL (ref 1.7–7.7)
OTHER: 0 %
PROMYELOCYTES ABS: 0 %
Platelets: 31 10*3/uL — ABNORMAL LOW (ref 150–400)
RBC: 3.04 MIL/uL — AB (ref 3.87–5.11)
RDW: 18.8 % — ABNORMAL HIGH (ref 11.5–15.5)
WBC: 9 10*3/uL (ref 4.0–10.5)

## 2017-05-31 LAB — BASIC METABOLIC PANEL
ANION GAP: 8 (ref 5–15)
BUN: 18 mg/dL (ref 6–20)
CALCIUM: 9.6 mg/dL (ref 8.9–10.3)
CHLORIDE: 99 mmol/L — AB (ref 101–111)
CO2: 30 mmol/L (ref 22–32)
CREATININE: 0.84 mg/dL (ref 0.44–1.00)
GFR calc non Af Amer: >60 mL/min (ref >60)
Glucose, Bld: 96 mg/dL (ref 65–99)
Potassium: 4.2 mmol/L (ref 3.5–5.1)
SODIUM: 137 mmol/L (ref 135–145)

## 2017-05-31 LAB — PROTIME-INR
INR: 0.96
Prothrombin Time: 12.7 seconds (ref 11.4–15.2)

## 2017-05-31 MED ORDER — CEFAZOLIN SODIUM-DEXTROSE 2-4 GM/100ML-% IV SOLN: 2.0000 g | INTRAVENOUS | Status: DC

## 2017-05-31 MED ORDER — SODIUM CHLORIDE 0.9 % IV SOLN
INTRAVENOUS | Status: DC
  Administered 2017-05-31: 14:00:00 via INTRAVENOUS

## 2017-05-31 NOTE — Treatment Plan (Signed)
  Platelets 31,000 today. Unable to do procedure.   Discussed with Mikey Bussing, NP and she is aware.  She feels patient may only need one more treatment for a while and may not need Port A Cath placed at this time.  If and when she is rescheduled for [employer], she will need to arrive at least 4 hours early for labs, Type and Screen, and prepare for platelet transfusion, or she can come to the cancer center first for labs and possible platelet transfusion.   Margia Wiesen S Naelani Lafrance PA-C 05/31/2017 3:09 PM

## 2017-05-31 NOTE — Discharge Instructions (Signed)
Implanted Port Insertion, Care After °This sheet gives you information about how to care for yourself after your procedure. Your health care provider may also give you more specific instructions. If you have problems or questions, contact your health care provider. °What can I expect after the procedure? °After your procedure, it is common to have: °· Discomfort at the port insertion site. °· Bruising on the skin over the port. This should improve over 3-4 days. ° °Follow these instructions at home: °Port care °· After your port is placed, you will get a manufacturer's information card. The card has information about your port. Keep this card with you at all times. °· Take care of the port as told by your health care provider. Ask your health care provider if you or a family member can get training for taking care of the port at home. A home health care nurse may also take care of the port. °· Make sure to remember what type of port you have. °Incision care °· Follow instructions from your health care provider about how to take care of your port insertion site. Make sure you: °? Wash your hands with soap and water before you change your bandage (dressing). If soap and water are not available, use hand sanitizer. °? Change your dressing as told by your health care provider. °? Leave stitches (sutures), skin glue, or adhesive strips in place. These skin closures may need to stay in place for 2 weeks or longer. If adhesive strip edges start to loosen and curl up, you may trim the loose edges. Do not remove adhesive strips completely unless your health care provider tells you to do that. °· Check your port insertion site every day for signs of infection. Check for: °? More redness, swelling, or pain. °? More fluid or blood. °? Warmth. °? Pus or a bad smell. °General instructions °· Do not take baths, swim, or use a hot tub until your health care provider approves. °· Do not lift anything that is heavier than 10 lb (4.5  kg) for a week, or as told by your health care provider. °· Ask your health care provider when it is okay to: °? Return to work or school. °? Resume usual physical activities or sports. °· Do not drive for 24 hours if you were given a medicine to help you relax (sedative). °· Take over-the-counter and prescription medicines only as told by your health care provider. °· Wear a medical alert bracelet in case of an emergency. This will tell any health care providers that you have a port. °· Keep all follow-up visits as told by your health care provider. This is important. °Contact a health care provider if: °· You cannot flush your port with saline as directed, or you cannot draw blood from the port. °· You have a fever or chills. °· You have more redness, swelling, or pain around your port insertion site. °· You have more fluid or blood coming from your port insertion site. °· Your port insertion site feels warm to the touch. °· You have pus or a bad smell coming from the port insertion site. °Get help right away if: °· You have chest pain or shortness of breath. °· You have bleeding from your port that you cannot control. °Summary °· Take care of the port as told by your health care provider. °· Change your dressing as told by your health care provider. °· Keep all follow-up visits as told by your health care provider. °  This information is not intended to replace advice given to you by your health care provider. Make sure you discuss any questions you have with your health care provider. °Document Released: 01/07/2013 Document Revised: 02/08/2016 Document Reviewed: 02/08/2016 °Elsevier Interactive Patient Education © 2017 [employer] °Moderate Conscious Sedation, Adult, Care After °These instructions provide you with information about caring for yourself after your procedure. Your health care provider may also give you more specific instructions. Your treatment has been planned according to current medical  practices, but problems sometimes occur. Call your health care provider if you have any problems or questions after your procedure. °What can I expect after the procedure? °After your procedure, it is common: °· To feel sleepy for several hours. °· To feel clumsy and have poor balance for several hours. °· To have poor judgment for several hours. °· To vomit if you eat too soon. ° °Follow these instructions at home: °For at least 24 hours after the procedure: ° °· Do not: °? Participate in activities where you could fall or become injured. °? Drive. °? Use heavy machinery. °? Drink alcohol. °? Take sleeping pills or medicines that cause drowsiness. °? Make important decisions or sign legal documents. °? Take care of children on your own. °· Rest. °Eating and drinking °· Follow the diet recommended by your health care provider. °· If you vomit: °? Drink water, juice, or soup when you can drink without vomiting. °? Make sure you have little or no nausea before eating solid foods. °General instructions °· Have a responsible adult stay with you until you are awake and alert. °· Take over-the-counter and prescription medicines only as told by your health care provider. °· If you smoke, do not smoke without supervision. °· Keep all follow-up visits as told by your health care provider. This is important. °Contact a health care provider if: °· You keep feeling nauseous or you keep vomiting. °· You feel light-headed. °· You develop a rash. °· You have a fever. °Get help right away if: °· You have trouble breathing. °This information is not intended to replace advice given to you by your health care provider. Make sure you discuss any questions you have with your health care provider. °Document Released: 01/07/2013 Document Revised: 08/22/2015 Document Reviewed: 07/09/2015 °Elsevier Interactive Patient Education © 2018 Elsevier Inc. ° °

## 2017-05-31 NOTE — H&P (Addendum)
Chief Complaint: Squamous cell lung cancer  Referring Physician(s): Herbster R  Supervising Physician: Aletta Edouard  Patient Status: [facility] - Out-pt  History of Present Illness: Paula Contreras is a 84 y.o. female with stage IV lung cancer who is here today for placement of a Port A Cath.  Other medical history includes anemia, CKD stage 3, COPD, and Afib.  She is on chronic home O2 via nasal cannula, 2 liters.  She was postponed previously for low platelets.  Past Medical History:  Diagnosis Date  . Anemia 02/20/2015  . Aortic stenosis    a. 2D Echo 07/2013: EF 60-65%, no RWMA, grade 1 DD, normal LV filling pressure, moderate AS, mild TR, PASP 41mmHg.  . Bradycardia    a. possible bradycardia with HR 44 by physical exam notes at nephrologist office in 03/2014. F/u event monitor showed NSR PACs average HR 81, bradycardia <1% of readable data, no pauses of 3 seconds or longer..  . Cataract   . Chronic respiratory failure ([place])    a. On home O2 since 2012.  . CKD (chronic kidney disease), stage III ([place])   . COPD (chronic obstructive pulmonary disease) ([place])   . Dementia   . Hypertension   . Osteoporosis   . Other psoriasis and similar disorders    psoriasis vs eczema  . Paroxysmal atrial fibrillation ([place])    a. Dx 07/2013. No attempts per Epic to restore NSR but in NSR 2016.  Marland Kitchen Pneumonia 03/2016  . Skin cancer     Past Surgical History:  Procedure Laterality Date  . BREAST SURGERY Right 1964   tumor removed  . CATARACT EXTRACTION Bilateral   . ERCP N/A 02/29/2016   Procedure: ENDOSCOPIC RETROGRADE CHOLANGIOPANCREATOGRAPHY (ERCP);  Surgeon: Irene Shipper, MD;  Location: [facility];  Service: Endoscopy;  Laterality: N/A;  . HIP SURGERY Right    fracture  . VIDEO BRONCHOSCOPY Bilateral 12/04/2016   Procedure: VIDEO BRONCHOSCOPY WITHOUT FLUORO;  Surgeon: Collene Gobble, MD;  Location: [facility] ENDOSCOPY;  Service: Cardiopulmonary;  Laterality: Bilateral;     Allergies: Codeine and Robitussin (alcohol free) [guaifenesin]  Medications: Prior to Admission medications   Medication Sig Start Date End Date Taking? Authorizing Provider  acetaminophen (TYLENOL) 325 MG tablet Take 650 mg by mouth every 6 (six) hours as needed (for pain).     [provider]  albuterol (PROAIR HFA) 108 (90 BASE) MCG/ACT inhaler Inhale 2 puffs into the lungs every 4 (four) hours as needed for wheezing or shortness of breath. 12/24/14   Collene Gobble, MD  alendronate (FOSAMAX) 70 MG tablet Take 70 mg by mouth every Monday. Take with a full glass of water on an empty stomach.    [provider]  benzonatate (TESSALON) 100 MG capsule Take 1 capsule (100 mg total) by mouth 3 (three) times daily as needed for cough. 12/07/16   [name], MD  calcium carbonate (OS-CAL) 600 MG TABS tablet Take 2 tablets by mouth daily with breakfast.     [provider]  carvedilol (COREG) 3.125 MG tablet  01/28/17   [provider]  Cholecalciferol (VITAMIN D) 2000 UNITS CAPS Take 2,000 Units by mouth daily with breakfast.     [provider]  esomeprazole (NEXIUM) 20 MG capsule Take 20 mg by mouth daily at 12 noon.    [provider]  feeding supplement (BOOST / RESOURCE BREEZE) LIQD Take 1 Container by mouth every evening.     [provider]  fluticasone (  FLONASE) 50 MCG/ACT nasal spray Place 1 spray into both nostrils daily. Patient not taking: Reported on 05/13/2017 03/03/16   Eugenie Filler, MD  hydrocortisone 2.5 % cream  11/20/16   [provider]  ipratropium-albuterol (DUONEB) 0.5-2.5 (3) MG/3ML SOLN  01/11/17   [provider]  levalbuterol Penne Lash) 0.63 MG/3ML nebulizer solution Take 3 mLs (0.63 mg total) by nebulization every 6 (six) hours as needed for wheezing or shortness of breath. 03/02/16   Eugenie Filler, MD  Multiple Vitamins-Iron (MULTIVITAMIN/IRON PO) Take 1 tablet by mouth daily.     [provider]  ondansetron (ZOFRAN-ODT) 4 MG disintegrating tablet Take 1 tablet by mouth every 8 (eight) hours as needed. 02/20/17   [provider]  OXYGEN Inhale 2-4 L into the lungs continuous. To keep O2 stats >90%    [provider]  prochlorperazine (COMPAZINE) 10 MG tablet Take 1 tablet (10 mg total) every 6 (six) hours as needed by mouth for nausea or vomiting. Patient not taking: Reported on 05/13/2017 02/04/17   Curt Bears, MD  senna-docusate (SENOKOT-S) 8.6-50 MG tablet Take 1 tablet by mouth at bedtime as needed for mild constipation. 02/23/15   Theodis Blaze, MD  Tiotropium Bromide Monohydrate (SPIRIVA RESPIMAT) 2.5 MCG/ACT AERS Inhale 2 puffs into the lungs daily.    [provider]  XARELTO 20 MG TABS tablet  11/19/16   [provider]     Family History  Problem Relation Age of Onset  . Asthma Mother   . Kidney disease Mother   . Lung cancer Father   . Asthma Maternal Grandmother   . Lung cancer Paternal Uncle        father's twin brother  . Diabetes Brother     Social History   Socioeconomic History  . Marital status: Widowed    Spouse name: None  . Number of children: 0  . Years of education: None  . Highest education level: None  Social Needs  . Financial resource strain: None  . Food insecurity - worry: None  . Food insecurity - inability: None  . Transportation needs - medical: None  . Transportation needs - non-medical: None  Occupational History  . Occupation: data entry specialist    Comment: retired  Tobacco Use  . Smoking status: Former Smoker    Packs/day: 1.00    Years: 55.00    Pack years: 55.00    Types: Cigarettes    Last attempt to quit: 09/25/2010    Years since quitting: 6.6  . Smokeless tobacco: Never Used  Substance and Sexual Activity  . Alcohol use: No  . Drug use: No  . Sexual activity: None  Other Topics Concern  . None  Social History Narrative  . None    Review of  Systems: A 12 point ROS discussed  Review of Systems  Constitutional: Negative.   HENT: Positive for hearing loss.   Respiratory: Positive for wheezing.   Cardiovascular: Negative.   Gastrointestinal: Negative.   Genitourinary: Negative.   Musculoskeletal: Negative.   Skin: Negative.   Neurological: Negative.   Hematological: Negative.   Psychiatric/Behavioral: Negative.     Vital Signs: BP 111/69   Pulse 86   Temp 97.6 F (36.4 C) (Oral)   Resp 16   SpO2 100%   Physical Exam  Constitutional: She is oriented to person, place, and time.  Elderly, thin, NAD  HENT:  Head: Normocephalic and atraumatic.  Eyes: EOM are normal.  Neck: Normal range of  motion.  Cardiovascular: Normal rate, regular rhythm and normal heart sounds.  Pulmonary/Chest: Effort normal. She has wheezes.  Abdominal: She exhibits no distension.  Musculoskeletal: Normal range of motion.  Neurological: She is alert and oriented to person, place, and time.  Skin: Skin is warm and dry.  Psychiatric: She has a normal mood and affect. Her behavior is normal. Judgment and thought content normal.  Vitals reviewed.   Imaging: No results found.  Labs:  CBC: Recent Labs    05/13/17 0945 05/21/17 0848 05/29/17 1300 05/31/17 1230  WBC 4.5 5.5 2.0* 9.0  HGB 10.5* 9.5* 8.3* 9.7*  HCT 32.6* 29.9* 25.1* 29.5*  PLT 21* 152 38* PENDING    COAGS: Recent Labs    12/02/16 0826 12/03/16 0913 05/06/17 1300 05/31/17 1230  INR 1.01 1.03 0.94 0.96    BMP: Recent Labs    05/13/17 0945 05/21/17 0848 05/29/17 1300 05/31/17 1230  NA 137 138 133* 137  K 4.7 4.1 5.3* 4.2  CL 96* 97* 96* 99*  CO2 32* 31* 27 30  GLUCOSE 93 91 97 96  BUN 24 22 23 18   CALCIUM 10.1 10.0 10.0 9.6  CREATININE 1.12* 1.11* 0.95 0.84  GFRNONAA 45* 45* 55* >60  GFRAA 52* 53* >60 >60    LIVER FUNCTION TESTS: Recent Labs    04/26/17 1040 05/13/17 0945 05/21/17 0848 05/29/17 1300  BILITOT 0.3 0.4 0.3 0.5  AST 21 17 14 14     ALT 9 14 7 11   ALKPHOS 72 72 65 72  PROT 7.0 7.3 7.1 7.0  ALBUMIN 3.5 3.6 3.4* 3.4*    TUMOR MARKERS: No results for input(s): AFPTM, CEA, CA199, CHROMGRNA in the last 8760 hours.  Assessment and Plan:  Stage IV lung cancer  Will proceed with placement of a Port A Cath today by Dr. Kathlene Cote as long as platelet count is above 50,000.  Risks and benefits of image guided port-a-catheter placement was discussed with the patient including, but not limited to bleeding, infection, pneumothorax, or fibrin sheath development and need for additional procedures.  All of the patient's questions were answered, patient is agreeable to proceed. Consent signed and in chart.  Thank you for this interesting consult.  I greatly enjoyed meeting Alline Pio and look forward to participating in their care.  A copy of this report was sent to the requesting provider on this date.  **Addendum**  Platelets 31,000 today. Unable to do procedure.  Discussed with Mikey Bussing, NP and she is aware. She feels patient may only need one more treatment for a while and may not need Port A Cath placed at this time. If and when she is rescheduled for [employer], she will need to arrive at least 4 hours early for labs, Type and Screen, and prepare for platelet transfusion, or she can come to the cancer center first for labs and possible platelet transfusion prior to procedure.  Electronically Signed: Murrell Redden, PA-C 05/31/2017, 2:37 PM   I spent a total of  25 minutes in face to face in clinical consultation, greater than 50% of which was counseling/coordinating care for [facility] A Cath placement.

## 2017-06-03 ENCOUNTER — Ambulatory Visit: Payer: Medicare Other | Admitting: Internal Medicine

## 2017-06-03 ENCOUNTER — Ambulatory Visit: Payer: Medicare Other

## 2017-06-03 ENCOUNTER — Inpatient Hospital Stay: Payer: Medicare Other | Attending: Internal Medicine

## 2017-06-03 DIAGNOSIS — C3492 Malignant neoplasm of unspecified part of left bronchus or lung: Secondary | ICD-10-CM

## 2017-06-03 DIAGNOSIS — C3402 Malignant neoplasm of left main bronchus: Secondary | ICD-10-CM | POA: Insufficient documentation

## 2017-06-03 DIAGNOSIS — D61810 Antineoplastic chemotherapy induced pancytopenia: Secondary | ICD-10-CM | POA: Insufficient documentation

## 2017-06-03 LAB — CBC WITH DIFFERENTIAL/PLATELET
Basophils Absolute: 0 10*3/uL (ref 0.0–0.1)
Basophils Relative: 0 %
Eosinophils Absolute: 0.1 10*3/uL (ref 0.0–0.5)
Eosinophils Relative: 1 %
HEMATOCRIT: 29.8 % — AB (ref 34.8–46.6)
HEMOGLOBIN: 9.5 g/dL — AB (ref 11.6–15.9)
LYMPHS PCT: 7 %
Lymphs Abs: 0.5 10*3/uL — ABNORMAL LOW (ref 0.9–3.3)
MCH: 31.8 pg (ref 25.1–34.0)
MCHC: 31.9 g/dL (ref 31.5–36.0)
MCV: 99.7 fL (ref 79.5–101.0)
MONO ABS: 0.7 10*3/uL (ref 0.1–0.9)
MONOS PCT: 10 %
NEUTROS ABS: 6.1 10*3/uL (ref 1.5–6.5)
NEUTROS PCT: 82 %
Platelets: 20 10*3/uL — ABNORMAL LOW (ref 145–400)
RBC: 2.99 MIL/uL — ABNORMAL LOW (ref 3.70–5.45)
RDW: 18.5 % — AB (ref 11.2–14.5)
WBC: 7.5 10*3/uL (ref 3.9–10.3)

## 2017-06-03 LAB — COMPREHENSIVE METABOLIC PANEL
ALBUMIN: 3.4 g/dL — AB (ref 3.5–5.0)
ALT: 12 U/L (ref 0–55)
ANION GAP: 8 (ref 3–11)
AST: 14 U/L (ref 5–34)
Alkaline Phosphatase: 67 U/L (ref 40–150)
BUN: 12 mg/dL (ref 7–26)
CO2: 32 mmol/L — AB (ref 22–29)
Calcium: 10.1 mg/dL (ref 8.4–10.4)
Chloride: 100 mmol/L (ref 98–109)
Creatinine, Ser: 0.86 mg/dL (ref 0.60–1.10)
GFR calc non Af Amer: >60 mL/min (ref >60)
GLUCOSE: 86 mg/dL (ref 70–140)
POTASSIUM: 4.7 mmol/L (ref 3.5–5.1)
SODIUM: 140 mmol/L (ref 136–145)
Total Bilirubin: 0.3 mg/dL (ref 0.2–1.2)
Total Protein: 6.8 g/dL (ref 6.4–8.3)

## 2017-06-10 ENCOUNTER — Other Ambulatory Visit: Payer: Medicare Other

## 2017-06-11 ENCOUNTER — Inpatient Hospital Stay (HOSPITAL_BASED_OUTPATIENT_CLINIC_OR_DEPARTMENT_OTHER): Payer: Medicare Other | Admitting: Internal Medicine

## 2017-06-11 ENCOUNTER — Inpatient Hospital Stay: Payer: Medicare Other

## 2017-06-11 ENCOUNTER — Encounter: Payer: Self-pay | Admitting: Internal Medicine

## 2017-06-11 VITALS — BP 96/59 | HR 97 | Temp 97.5°F | Resp 24

## 2017-06-11 DIAGNOSIS — D61810 Antineoplastic chemotherapy induced pancytopenia: Secondary | ICD-10-CM | POA: Diagnosis not present

## 2017-06-11 DIAGNOSIS — Z5111 Encounter for antineoplastic chemotherapy: Secondary | ICD-10-CM

## 2017-06-11 DIAGNOSIS — C3492 Malignant neoplasm of unspecified part of left bronchus or lung: Secondary | ICD-10-CM

## 2017-06-11 DIAGNOSIS — C3402 Malignant neoplasm of left main bronchus: Secondary | ICD-10-CM | POA: Diagnosis not present

## 2017-06-11 DIAGNOSIS — C3490 Malignant neoplasm of unspecified part of unspecified bronchus or lung: Secondary | ICD-10-CM

## 2017-06-11 LAB — COMPREHENSIVE METABOLIC PANEL
ALT: 11 U/L (ref 0–55)
AST: 14 U/L (ref 5–34)
Albumin: 3.4 g/dL — ABNORMAL LOW (ref 3.5–5.0)
Alkaline Phosphatase: 60 U/L (ref 40–150)
Anion gap: 8 (ref 3–11)
BUN: 24 mg/dL (ref 7–26)
CHLORIDE: 100 mmol/L (ref 98–109)
CO2: 30 mmol/L — AB (ref 22–29)
CREATININE: 0.96 mg/dL (ref 0.60–1.10)
Calcium: 9.9 mg/dL (ref 8.4–10.4)
GFR calc Af Amer: >60 mL/min (ref >60)
GFR calc non Af Amer: 54 mL/min — ABNORMAL LOW (ref >60)
Glucose, Bld: 80 mg/dL (ref 70–140)
Potassium: 3.9 mmol/L (ref 3.5–5.1)
SODIUM: 138 mmol/L (ref 136–145)
Total Bilirubin: 0.3 mg/dL (ref 0.2–1.2)
Total Protein: 6.8 g/dL (ref 6.4–8.3)

## 2017-06-11 LAB — CBC WITH DIFFERENTIAL/PLATELET
BASOS PCT: 0 %
Basophils Absolute: 0 10*3/uL (ref 0.0–0.1)
EOS PCT: 1 %
Eosinophils Absolute: 0 10*3/uL (ref 0.0–0.5)
HCT: 28.7 % — ABNORMAL LOW (ref 34.8–46.6)
Hemoglobin: 9.5 g/dL — ABNORMAL LOW (ref 11.6–15.9)
LYMPHS PCT: 10 %
Lymphs Abs: 0.6 10*3/uL — ABNORMAL LOW (ref 0.9–3.3)
MCH: 32.1 pg (ref 25.1–34.0)
MCHC: 33.1 g/dL (ref 31.5–36.0)
MCV: 96.8 fL (ref 79.5–101.0)
Monocytes Absolute: 0.6 10*3/uL (ref 0.1–0.9)
Monocytes Relative: 11 %
Neutro Abs: 4.2 10*3/uL (ref 1.5–6.5)
Neutrophils Relative %: 78 %
PLATELETS: 52 10*3/uL — AB (ref 145–400)
RBC: 2.96 MIL/uL — AB (ref 3.70–5.45)
RDW: 19.7 % — ABNORMAL HIGH (ref 11.2–14.5)
WBC: 5.4 10*3/uL (ref 3.9–10.3)

## 2017-06-11 NOTE — Progress Notes (Signed)
[place] [phone]   [phone]  OFFICE PROGRESS NOTE  Leonard Downing, MD [place] [address]  DIAGNOSIS: stage IV open (T3, N1, M1a) metastatic non-small cell lung cancer,squamous cell carcinoma presented with large central obstructive left hilar mass with hilar level at The patient left pleural effusion diagnosed in September 2018.  PRIOR THERAPY: Palliative radiotherapy to the left mainstem bronchus for a total dose of 20 GY in 5 fractions completed on 12/14/2016.  CURRENT THERAPY: Palliative systemic chemotherapy with carboplatin for an AUC of 5 and paclitaxel 175 mg/m every 3 weeks with Neulasta support. First dose 02/13/2017.  Status post 5 cycles.  INTERVAL HISTORY: Paula Contreras 84 y.o. female returns to the clinic today for follow-up visit.  The patient is feeling fine today with no specific complaints except for the baseline shortness of breath and she is currently on home oxygen.  She denied having any chest pain, cough or hemoptysis.  She denied having any recent weight loss or night sweats.  She has no nausea, vomiting, diarrhea or constipation.  She was supposed to start cycle #6 of her treatment today.  MEDICAL HISTORY: Past Medical History:  Diagnosis Date  . Anemia 02/20/2015  . Aortic stenosis    a. 2D Echo 07/2013: EF 60-65%, no RWMA, grade 1 DD, normal LV filling pressure, moderate AS, mild TR, PASP 75mmHg.  . Bradycardia    a. possible bradycardia with HR 44 by physical exam notes at nephrologist office in 03/2014. F/u event monitor showed NSR PACs average HR 81, bradycardia <1% of readable data, no pauses of 3 seconds or longer..  . Cataract   . Chronic respiratory failure ([place])    a. On home O2 since 2012.  . CKD (chronic kidney disease), stage III ([place])   . COPD (chronic obstructive pulmonary disease) ([place])   . Dementia   . Hypertension   . Osteoporosis   . Other psoriasis and  similar disorders    psoriasis vs eczema  . Paroxysmal atrial fibrillation ([place])    a. Dx 07/2013. No attempts per Epic to restore NSR but in NSR 2016.  Marland Kitchen Pneumonia 03/2016  . Skin cancer     ALLERGIES:  is allergic to codeine and robitussin (alcohol free) [guaifenesin].  MEDICATIONS:  Current Outpatient Medications  Medication Sig Dispense Refill  . acetaminophen (TYLENOL) 325 MG tablet Take 650 mg by mouth every 6 (six) hours as needed (for pain).     Marland Kitchen albuterol (PROAIR HFA) 108 (90 BASE) MCG/ACT inhaler Inhale 2 puffs into the lungs every 4 (four) hours as needed for wheezing or shortness of breath. 1 Inhaler 3  . alendronate (FOSAMAX) 70 MG tablet Take 70 mg by mouth every Monday. Take with a full glass of water on an empty stomach.    . benzonatate (TESSALON) 100 MG capsule Take 1 capsule (100 mg total) by mouth 3 (three) times daily as needed for cough. 20 capsule 0  . calcium carbonate (OS-CAL) 600 MG TABS tablet Take 2 tablets by mouth daily with breakfast.     . carvedilol (COREG) 3.125 MG tablet     . Cholecalciferol (VITAMIN D) 2000 UNITS CAPS Take 2,000 Units by mouth daily with breakfast.     . esomeprazole (NEXIUM) 20 MG capsule Take 20 mg by mouth daily at 12 noon.    . feeding supplement (BOOST / RESOURCE BREEZE) LIQD Take 1 Container by mouth every evening.     Marland Kitchen  fluticasone (FLONASE) 50 MCG/ACT nasal spray Place 1 spray into both nostrils daily. (Patient not taking: Reported on 05/13/2017)  2  . hydrocortisone 2.5 % cream     . ipratropium-albuterol (DUONEB) 0.5-2.5 (3) MG/3ML SOLN     . levalbuterol (XOPENEX) 0.63 MG/3ML nebulizer solution Take 3 mLs (0.63 mg total) by nebulization every 6 (six) hours as needed for wheezing or shortness of breath.    . Multiple Vitamins-Iron (MULTIVITAMIN/IRON PO) Take 1 tablet by mouth daily.    . ondansetron (ZOFRAN-ODT) 4 MG disintegrating tablet Take 1 tablet by mouth every 8 (eight) hours as needed.    . OXYGEN Inhale 2-4 L into the  lungs continuous. To keep O2 stats >90%    . prochlorperazine (COMPAZINE) 10 MG tablet Take 1 tablet (10 mg total) every 6 (six) hours as needed by mouth for nausea or vomiting. (Patient not taking: Reported on 05/13/2017) 30 tablet 0  . senna-docusate (SENOKOT-S) 8.6-50 MG tablet Take 1 tablet by mouth at bedtime as needed for mild constipation.    . Tiotropium Bromide Monohydrate (SPIRIVA RESPIMAT) 2.5 MCG/ACT AERS Inhale 2 puffs into the lungs daily.    [name] 20 MG TABS tablet      No current facility-administered medications for this visit.    Facility-Administered Medications Ordered in Other Visits  Medication Dose Route Frequency Provider Last Rate Last Dose  . acetaminophen (TYLENOL) tablet 650 mg  650 mg Oral Once Maryanna Shape, NP        SURGICAL HISTORY:  Past Surgical History:  Procedure Laterality Date  . BREAST SURGERY Right 1964   tumor removed  . CATARACT EXTRACTION Bilateral   . ERCP N/A 02/29/2016   Procedure: ENDOSCOPIC RETROGRADE CHOLANGIOPANCREATOGRAPHY (ERCP);  Surgeon: Irene Shipper, MD;  Location: [facility];  Service: Endoscopy;  Laterality: N/A;  . HIP SURGERY Right    fracture  . VIDEO BRONCHOSCOPY Bilateral 12/04/2016   Procedure: VIDEO BRONCHOSCOPY WITHOUT FLUORO;  Surgeon: Collene Gobble, MD;  Location: [facility] ENDOSCOPY;  Service: Cardiopulmonary;  Laterality: Bilateral;    REVIEW OF SYSTEMS:  A comprehensive review of systems was negative except for: Constitutional: positive for fatigue Respiratory: positive for dyspnea on exertion   PHYSICAL EXAMINATION: General appearance: alert, cooperative, fatigued and no distress Head: Normocephalic, without obvious abnormality, atraumatic Neck: no adenopathy, no JVD, supple, symmetrical, trachea midline and thyroid not enlarged, symmetric, no tenderness/mass/nodules Lymph nodes: Cervical, supraclavicular, and axillary nodes normal. Resp: clear to auscultation bilaterally Back: symmetric, no curvature. ROM  normal. No CVA tenderness. Cardio: regular rate and rhythm, S1, S2 normal, no murmur, click, rub or gallop GI: soft, non-tender; bowel sounds normal; no masses,  no organomegaly Extremities: extremities normal, atraumatic, no cyanosis or edema  ECOG PERFORMANCE STATUS: 1 - Symptomatic but completely ambulatory  Blood pressure (!) 96/59, pulse 97, temperature (!) 97.5 F (36.4 C), temperature source Oral, resp. rate (!) 24, SpO2 100 %.  LABORATORY DATA: Lab Results  Component Value Date   WBC 5.4 06/11/2017   HGB 9.5 (L) 06/11/2017   HCT 28.7 (L) 06/11/2017   MCV 96.8 06/11/2017   PLT 52 (L) 06/11/2017      Chemistry      Component Value Date/Time   NA 140 06/03/2017 0922   NA 140 04/01/2017 0738   K 4.7 06/03/2017 0922   K 4.5 04/01/2017 0738   CL 100 06/03/2017 0922   CO2 32 (H) 06/03/2017 0922   CO2 33 (H) 04/01/2017 0738   BUN 12 06/03/2017  0922   BUN 16.3 04/01/2017 0738   CREATININE 0.86 06/03/2017 0922   CREATININE 1.0 04/01/2017 0738      Component Value Date/Time   CALCIUM 10.1 06/03/2017 0922   CALCIUM 9.5 04/01/2017 0738   ALKPHOS 67 06/03/2017 0922   ALKPHOS 66 04/01/2017 0738   AST 14 06/03/2017 0922   AST 18 04/01/2017 0738   ALT 12 06/03/2017 0922   ALT 14 04/01/2017 0738   BILITOT 0.3 06/03/2017 0922   BILITOT 0.22 04/01/2017 0738       RADIOGRAPHIC STUDIES: No results found.  ASSESSMENT AND PLAN: This is a very pleasant 84 years old white female with a stage IV non-small cell lung cancer, squamous cell carcinoma.  She is currently undergoing systemic chemotherapy with carboplatin and paclitaxel status post 5 cycles. She continues to tolerate this treatment fairly well except for pancytopenia. Her platelets count are low today and the patient will not be able to proceed with cycle #6 today as a schedule. I will arrange for the patient to have repeat CT scan of the chest, abdomen and pelvis in 3 weeks for restaging of her disease.  She will come  back for follow-up visit at that time.  We will skip cycle #6 of her treatment because of the concerning pancytopenia. The patient was advised to call immediately if she has any concerning symptoms in the interval. The patient voices understanding of current disease status and treatment options and is in agreement with the current care plan.  All questions were answered. The patient knows to call the clinic with any problems, questions or concerns. We can certainly see the patient much sooner if necessary.  I spent 10 minutes counseling the patient face to face. The total time spent in the appointment was 15 minutes.  Disclaimer: This note was dictated with voice recognition software. Similar sounding words can inadvertently be transcribed and may not be corrected upon review.

## 2017-06-17 ENCOUNTER — Other Ambulatory Visit: Payer: Self-pay | Admitting: Medical Oncology

## 2017-06-17 ENCOUNTER — Telehealth: Payer: Self-pay | Admitting: Medical Oncology

## 2017-06-17 DIAGNOSIS — C3492 Malignant neoplasm of unspecified part of left bronchus or lung: Secondary | ICD-10-CM

## 2017-06-17 NOTE — Telephone Encounter (Signed)
Clarifying ct appts. Pt cannot drink contrast. CT abd/pelvis changed to no contrast per [facility].

## 2017-06-20 ENCOUNTER — Ambulatory Visit (HOSPITAL_COMMUNITY)
Admission: RE | Admit: 2017-06-20 | Discharge: 2017-06-20 | Disposition: A | Discharge status: Home / Self Care | Payer: Medicare Other | Source: Ambulatory Visit | Attending: Internal Medicine | Admitting: Internal Medicine

## 2017-06-20 ENCOUNTER — Inpatient Hospital Stay: Payer: Medicare Other

## 2017-06-20 DIAGNOSIS — J9819 Other pulmonary collapse: Secondary | ICD-10-CM | POA: Diagnosis not present

## 2017-06-20 DIAGNOSIS — Z9889 Other specified postprocedural states: Secondary | ICD-10-CM | POA: Insufficient documentation

## 2017-06-20 DIAGNOSIS — J438 Other emphysema: Secondary | ICD-10-CM | POA: Diagnosis not present

## 2017-06-20 DIAGNOSIS — C3402 Malignant neoplasm of left main bronchus: Secondary | ICD-10-CM | POA: Diagnosis not present

## 2017-06-20 DIAGNOSIS — C3490 Malignant neoplasm of unspecified part of unspecified bronchus or lung: Secondary | ICD-10-CM

## 2017-06-20 DIAGNOSIS — I700 Atherosclerosis of aorta: Secondary | ICD-10-CM | POA: Insufficient documentation

## 2017-06-20 DIAGNOSIS — I2510 Atherosclerotic heart disease of native coronary artery without angina pectoris: Secondary | ICD-10-CM | POA: Diagnosis not present

## 2017-06-20 DIAGNOSIS — N83201 Unspecified ovarian cyst, right side: Secondary | ICD-10-CM | POA: Diagnosis not present

## 2017-06-20 DIAGNOSIS — J432 Centrilobular emphysema: Secondary | ICD-10-CM | POA: Insufficient documentation

## 2017-06-20 DIAGNOSIS — Z85118 Personal history of other malignant neoplasm of bronchus and lung: Secondary | ICD-10-CM | POA: Insufficient documentation

## 2017-06-20 DIAGNOSIS — J90 Pleural effusion, not elsewhere classified: Secondary | ICD-10-CM | POA: Insufficient documentation

## 2017-06-20 LAB — CBC WITH DIFFERENTIAL (CANCER CENTER ONLY)
BASOS ABS: 0 10*3/uL (ref 0.0–0.1)
BASOS PCT: 1 %
Eosinophils Absolute: 0.1 10*3/uL (ref 0.0–0.5)
Eosinophils Relative: 1 %
HEMATOCRIT: 31.3 % — AB (ref 34.8–46.6)
HEMOGLOBIN: 10 g/dL — AB (ref 11.6–15.9)
Lymphocytes Relative: 11 %
Lymphs Abs: 0.7 10*3/uL — ABNORMAL LOW (ref 0.9–3.3)
MCH: 32.5 pg (ref 25.1–34.0)
MCHC: 31.9 g/dL (ref 31.5–36.0)
MCV: 101.6 fL — ABNORMAL HIGH (ref 79.5–101.0)
Monocytes Absolute: 0.7 10*3/uL (ref 0.1–0.9)
Monocytes Relative: 11 %
NEUTROS ABS: 5 10*3/uL (ref 1.5–6.5)
NEUTROS PCT: 76 %
Platelet Count: 186 10*3/uL (ref 145–400)
RBC: 3.08 MIL/uL — ABNORMAL LOW (ref 3.70–5.45)
RDW: 18.4 % — ABNORMAL HIGH (ref 11.2–14.5)
WBC Count: 6.5 10*3/uL (ref 3.9–10.3)

## 2017-06-20 LAB — CMP (CANCER CENTER ONLY)
ALK PHOS: 63 U/L (ref 40–150)
ALT: 8 U/L (ref 0–55)
ANION GAP: 9 (ref 3–11)
AST: 15 U/L (ref 5–34)
Albumin: 3.4 g/dL — ABNORMAL LOW (ref 3.5–5.0)
BILIRUBIN TOTAL: 0.3 mg/dL (ref 0.2–1.2)
BUN: 18 mg/dL (ref 7–26)
CALCIUM: 10.2 mg/dL (ref 8.4–10.4)
CO2: 32 mmol/L — ABNORMAL HIGH (ref 22–29)
Chloride: 97 mmol/L — ABNORMAL LOW (ref 98–109)
Creatinine: 1 mg/dL (ref 0.60–1.10)
GFR, EST AFRICAN AMERICAN: 60 mL/min — AB (ref >60)
GFR, Estimated: 51 mL/min — ABNORMAL LOW (ref >60)
Glucose, Bld: 84 mg/dL (ref 70–140)
POTASSIUM: 4.6 mmol/L (ref 3.5–5.1)
Sodium: 138 mmol/L (ref 136–145)
TOTAL PROTEIN: 7.3 g/dL (ref 6.4–8.3)

## 2017-06-20 MED ORDER — IOPAMIDOL (ISOVUE-300) INJECTION 61%
INTRAVENOUS | Status: AC
  Filled 2017-06-20: qty 100

## 2017-06-20 MED ORDER — IOPAMIDOL (ISOVUE-300) INJECTION 61%
100.0000 mL | Freq: Once | INTRAVENOUS | Status: AC | PRN
  Administered 2017-06-20: 75 mL via INTRAVENOUS

## 2017-07-04 ENCOUNTER — Encounter: Payer: Self-pay | Admitting: Internal Medicine

## 2017-07-04 ENCOUNTER — Inpatient Hospital Stay: Payer: Medicare Other | Attending: Internal Medicine | Admitting: Internal Medicine

## 2017-07-04 ENCOUNTER — Telehealth: Payer: Self-pay | Admitting: Internal Medicine

## 2017-07-04 VITALS — BP 101/45 | HR 98 | Temp 98.0°F | Resp 17 | Ht 66.0 in | Wt 122.4 lb

## 2017-07-04 DIAGNOSIS — C3492 Malignant neoplasm of unspecified part of left bronchus or lung: Secondary | ICD-10-CM

## 2017-07-04 DIAGNOSIS — C3490 Malignant neoplasm of unspecified part of unspecified bronchus or lung: Secondary | ICD-10-CM

## 2017-07-04 DIAGNOSIS — I10 Essential (primary) hypertension: Secondary | ICD-10-CM

## 2017-07-04 DIAGNOSIS — Z9981 Dependence on supplemental oxygen: Secondary | ICD-10-CM

## 2017-07-04 DIAGNOSIS — R531 Weakness: Secondary | ICD-10-CM

## 2017-07-04 DIAGNOSIS — Z9221 Personal history of antineoplastic chemotherapy: Secondary | ICD-10-CM

## 2017-07-04 DIAGNOSIS — Z79899 Other long term (current) drug therapy: Secondary | ICD-10-CM | POA: Diagnosis not present

## 2017-07-04 DIAGNOSIS — R0602 Shortness of breath: Secondary | ICD-10-CM

## 2017-07-04 DIAGNOSIS — Z923 Personal history of irradiation: Secondary | ICD-10-CM | POA: Diagnosis not present

## 2017-07-04 DIAGNOSIS — Z85118 Personal history of other malignant neoplasm of bronchus and lung: Secondary | ICD-10-CM

## 2017-07-04 NOTE — Progress Notes (Signed)
[facility] [phone]   [phone]  OFFICE PROGRESS NOTE  Leonard Downing, MD [place] [id number]  DIAGNOSIS: stage IV open (T3, N1, M1a) metastatic non-small cell lung cancer,squamous cell carcinoma presented with large central obstructive left hilar mass with hilar level at The patient left pleural effusion diagnosed in September 2018.  PRIOR THERAPY:  1) Palliative radiotherapy to the left mainstem bronchus for a total dose of 20 GY in 5 fractions completed on 12/14/2016. 2)  Palliative systemic chemotherapy with carboplatin for an AUC of 5 and paclitaxel 175 mg/m every 3 weeks with Neulasta support. First dose 02/13/2017.  Status post 5 cycles.  CURRENT THERAPY: Observation.  INTERVAL HISTORY: Paula Contreras 84 y.o. female returns to the clinic for follow-up visit accompanied by her sister-in-law.  The patient is feeling fine today with no specific complaints.  She continues to have the baseline shortness of breath and she is currently on home oxygen.  She denied having any chest pain, cough or hemoptysis.  She lost a few pounds since her last visit.  She does not eat well.  She denied having any fever or chills.  She has no nausea, vomiting, diarrhea or constipation.  She completed 5 cycles of systemic chemotherapy with carboplatin and paclitaxel.  The patient had repeat CT scan of the chest, abdomen and pelvis performed recently and she is here for evaluation and discussion of her risk her results.  MEDICAL HISTORY: Past Medical History:  Diagnosis Date  . Anemia 02/20/2015  . Aortic stenosis    a. 2D Echo 07/2013: EF 60-65%, no RWMA, grade 1 DD, normal LV filling pressure, moderate AS, mild TR, PASP 59mmHg.  . Bradycardia    a. possible bradycardia with HR 44 by physical exam notes at nephrologist office in 03/2014. F/u event monitor showed NSR PACs average HR 81, bradycardia <1% of readable data, no pauses of  3 seconds or longer..  . Cataract   . Chronic respiratory failure ([place])    a. On home O2 since 2012.  . CKD (chronic kidney disease), stage III ([place])   . COPD (chronic obstructive pulmonary disease) ([place])   . Dementia   . Hypertension   . Osteoporosis   . Other psoriasis and similar disorders    psoriasis vs eczema  . Paroxysmal atrial fibrillation ([place])    a. Dx 07/2013. No attempts per Epic to restore NSR but in NSR 2016.  Marland Kitchen Pneumonia 03/2016  . Skin cancer     ALLERGIES:  is allergic to codeine and robitussin (alcohol free) [guaifenesin].  MEDICATIONS:  Current Outpatient Medications  Medication Sig Dispense Refill  . acetaminophen (TYLENOL) 325 MG tablet Take 650 mg by mouth every 6 (six) hours as needed (for pain).     Marland Kitchen albuterol (PROAIR HFA) 108 (90 BASE) MCG/ACT inhaler Inhale 2 puffs into the lungs every 4 (four) hours as needed for wheezing or shortness of breath. 1 Inhaler 3  . carvedilol (COREG) 3.125 MG tablet     . Cholecalciferol (VITAMIN D) 2000 UNITS CAPS Take 2,000 Units by mouth daily with breakfast.     . esomeprazole (NEXIUM) 20 MG capsule Take 20 mg by mouth daily at 12 noon.    . feeding supplement (BOOST / RESOURCE BREEZE) LIQD Take 1 Container by mouth every evening.     Marland Kitchen ipratropium-albuterol (DUONEB) 0.5-2.5 (3) MG/3ML SOLN     . levalbuterol (XOPENEX) 0.63 MG/3ML nebulizer solution Take 3  mLs (0.63 mg total) by nebulization every 6 (six) hours as needed for wheezing or shortness of breath.    . Multiple Vitamins-Iron (MULTIVITAMIN/IRON PO) Take 1 tablet by mouth daily.    . OXYGEN Inhale 2-4 L into the lungs continuous. To keep O2 stats >90%    . Tiotropium Bromide Monohydrate (SPIRIVA RESPIMAT) 2.5 MCG/ACT AERS Inhale 2 puffs into the lungs daily.    Marland Kitchen zinc oxide (BALMEX) 11.3 % CREA cream Apply 1 application topically 2 (two) times daily.    . benzonatate (TESSALON) 100 MG capsule Take 1 capsule (100 mg total) by mouth 3 (three) times daily as needed  for cough. (Patient not taking: Reported on 07/04/2017) 20 capsule 0  . ondansetron (ZOFRAN-ODT) 4 MG disintegrating tablet Take 1 tablet by mouth every 8 (eight) hours as needed.    . prochlorperazine (COMPAZINE) 10 MG tablet Take 1 tablet (10 mg total) every 6 (six) hours as needed by mouth for nausea or vomiting. (Patient not taking: Reported on 07/04/2017) 30 tablet 0  . senna-docusate (SENOKOT-S) 8.6-50 MG tablet Take 1 tablet by mouth at bedtime as needed for mild constipation. (Patient not taking: Reported on 07/04/2017)     No current facility-administered medications for this visit.    Facility-Administered Medications Ordered in Other Visits  Medication Dose Route Frequency Provider Last Rate Last Dose  . acetaminophen (TYLENOL) tablet 650 mg  650 mg Oral Once Maryanna Shape, NP        SURGICAL HISTORY:  Past Surgical History:  Procedure Laterality Date  . BREAST SURGERY Right 1964   tumor removed  . CATARACT EXTRACTION Bilateral   . ERCP N/A 02/29/2016   Procedure: ENDOSCOPIC RETROGRADE CHOLANGIOPANCREATOGRAPHY (ERCP);  Surgeon: Irene Shipper, MD;  Location: [facility];  Service: Endoscopy;  Laterality: N/A;  . HIP SURGERY Right    fracture  . VIDEO BRONCHOSCOPY Bilateral 12/04/2016   Procedure: VIDEO BRONCHOSCOPY WITHOUT FLUORO;  Surgeon: Collene Gobble, MD;  Location: [facility] ENDOSCOPY;  Service: Cardiopulmonary;  Laterality: Bilateral;    REVIEW OF SYSTEMS:  Constitutional: positive for fatigue and weight loss Eyes: negative Ears, nose, mouth, throat, and face: negative Respiratory: positive for dyspnea on exertion Cardiovascular: negative Gastrointestinal: negative Genitourinary:negative Integument/breast: negative Hematologic/lymphatic: negative Musculoskeletal:negative Neurological: negative Behavioral/Psych: negative Endocrine: negative Allergic/Immunologic: negative   PHYSICAL EXAMINATION: General appearance: alert, cooperative, fatigued and no distress Head:  Normocephalic, without obvious abnormality, atraumatic Neck: no adenopathy, no JVD, supple, symmetrical, trachea midline and thyroid not enlarged, symmetric, no tenderness/mass/nodules Lymph nodes: Cervical, supraclavicular, and axillary nodes normal. Resp: clear to auscultation bilaterally Back: symmetric, no curvature. ROM normal. No CVA tenderness. Cardio: regular rate and rhythm, S1, S2 normal, no murmur, click, rub or gallop GI: soft, non-tender; bowel sounds normal; no masses,  no organomegaly Extremities: extremities normal, atraumatic, no cyanosis or edema Neurologic: Alert and oriented X 3, normal strength and tone. Normal symmetric reflexes. Normal coordination and gait  ECOG PERFORMANCE STATUS: 1 - Symptomatic but completely ambulatory  Blood pressure (!) 101/45, pulse 98, temperature 98 F (36.7 C), temperature source Oral, resp. rate 17, height 5\' 6"  (1.676 m), weight 122 lb 6.4 oz (55.5 kg), SpO2 100 %.  LABORATORY DATA: Lab Results  Component Value Date   WBC 6.5 06/20/2017   HGB 9.5 (L) 06/11/2017   HCT 31.3 (L) 06/20/2017   MCV 101.6 (H) 06/20/2017   PLT 186 06/20/2017      Chemistry      Component Value Date/Time   NA 138 06/20/2017  0840   NA 140 04/01/2017 0738   K 4.6 06/20/2017 0840   K 4.5 04/01/2017 0738   CL 97 (L) 06/20/2017 0840   CO2 32 (H) 06/20/2017 0840   CO2 33 (H) 04/01/2017 0738   BUN 18 06/20/2017 0840   BUN 16.3 04/01/2017 0738   CREATININE 1.00 06/20/2017 0840   CREATININE 1.0 04/01/2017 0738      Component Value Date/Time   CALCIUM 10.2 06/20/2017 0840   CALCIUM 9.5 04/01/2017 0738   ALKPHOS 63 06/20/2017 0840   ALKPHOS 66 04/01/2017 0738   AST 15 06/20/2017 0840   AST 18 04/01/2017 0738   ALT 8 06/20/2017 0840   ALT 14 04/01/2017 0738   BILITOT 0.3 06/20/2017 0840   BILITOT 0.22 04/01/2017 0738       RADIOGRAPHIC STUDIES: Ct Chest W Contrast  Result Date: 06/21/2017 CLINICAL DATA:  84 year old female with history of lung  cancer diagnosed in 2018 status post radiation therapy (completed in September 2018) and chemotherapy (completed in November 2018). EXAM: CT CHEST, ABDOMEN AND PELVIS WITHOUT CONTRAST TECHNIQUE: Multidetector CT imaging of the chest, abdomen and pelvis was performed following the standard protocol without IV contrast. COMPARISON:  CT the chest, abdomen and pelvis 04/23/2017. PET-CT 01/28/2017. Chest CT 01/15/2017. CT the abdomen and pelvis 12/03/2016. FINDINGS: CT CHEST FINDINGS Cardiovascular: Heart size is normal. Small amount of pericardial fluid and/or thickening, similar to the prior study, and unlikely to be of any hemodynamic significance at this time. No associated pericardial calcification. There is aortic atherosclerosis, as well as atherosclerosis of the great vessels of the mediastinum and the coronary arteries, including calcified atherosclerotic plaque in the left main, left anterior descending, left circumflex and right coronary arteries. Severe calcifications of the aortic valve. Mediastinum/Nodes: No pathologically enlarged mediastinal or hilar lymph nodes. Moderate-sized hiatal hernia. No axillary lymphadenopathy. Lungs/Pleura: There continues to be complete collapse of the left upper lobe obscuring the previously treated left upper lobe pulmonary nodule. This does not have a mass-like appearance or abnormal contour to strongly suggest the presence of residual/recurrent disease on today's examination. Some compensatory hyperexpansion of the left lower lobe is noted. Chronic small left pleural effusion lying dependently, similar to the prior examination. Tiny 3 mm nodule near the base of the left hemithorax (axial image 113 of series 4), stable compared to prior examinations, likely benign. Likewise, a 4 mm nodule in the anterior aspect of the right upper lobe (axial image 56 of series 4) is also unchanged compared to prior studies, also favored to be benign. No other definite suspicious appearing  pulmonary nodules or masses are noted. Diffuse bronchial wall thickening with mild to moderate centrilobular and paraseptal emphysema. No acute consolidative airspace disease. No right-sided pleural effusion. Musculoskeletal: Again noted are several chronic compression fractures, including a severe compression fracture of T10 with 80% loss of anterior vertebral body height. Old healed fracture of the anterolateral aspect of the left sixth rib, similar to prior examinations. There are no aggressive appearing lytic or blastic lesions noted in the visualized portions of the skeleton. CT ABDOMEN PELVIS FINDINGS Hepatobiliary: No suspicious cystic or solid hepatic lesions. No intra or extrahepatic biliary ductal dilatation. Pneumobilia, similar to prior examinations, presumably from prior sphincterotomy. Other than gas in the nondependent portion of the lumen of the gallbladder, gallbladder is otherwise normal in appearance. Pancreas: No pancreatic mass. No pancreatic ductal dilatation. Small amount of gas in the pancreatic duct, presumably from prior sphincterotomy. No pancreatic or peripancreatic fluid or inflammatory changes.  Spleen: Unremarkable. Adrenals/Urinary Tract: Bilateral kidneys and bilateral adrenal glands are normal in appearance. No hydroureteronephrosis. Urinary bladder is normal in appearance. Stomach/Bowel: Intraabdominal portion of the stomach is normal in appearance. No pathologic dilatation of small bowel or colon. The appendix is not confidently identified and may be surgically absent. Regardless, there are no inflammatory changes noted adjacent to the cecum to suggest the presence of an acute appendicitis at this time. Vascular/Lymphatic: Aortic atherosclerosis, without evidence of aneurysm or dissection in the abdominal or pelvic vasculature. No lymphadenopathy noted in the abdomen or pelvis. Reproductive: Uterus and left ovary are atrophic. In the right adnexa there is again a very large  low-attenuation lesion which currently measures 7.6 x 6.6 x 9.3 cm (axial image 95 of series 2 and coronal image 97 of series 5), similar to recent prior examinations. Other: No significant volume of ascites.  No pneumoperitoneum. Musculoskeletal: Chronic compression fractures are again noted at L1, L4 and L5, similar to the prior study, most severe at L1 where there is 80% loss of central vertebral body height. There are no aggressive appearing lytic or blastic lesions noted in the visualized portions of the skeleton. IMPRESSION: 1. Stable post treatment changes in the thorax with complete chronic collapse of the left upper lobe. No definite findings to suggest locally recurrent disease or metastatic disease elsewhere in the chest, abdomen or pelvis. 2. Small chronic left pleural effusion lying dependently, similar to prior examinations. 3. Mild diffuse bronchial wall thickening with mild to moderate centrilobular and paraseptal emphysema; imaging findings suggestive of underlying COPD. 4. Aortic atherosclerosis, in addition to left main and 3 vessel coronary artery disease. Please note that although the presence of coronary artery calcium documents the presence of coronary artery disease, the severity of this disease and any potential stenosis cannot be assessed on this non-gated CT examination. Assessment for potential risk factor modification, dietary therapy or pharmacologic therapy may be warranted, if clinically indicated. 5. There are severe calcifications of the aortic valve. Echocardiographic correlation for evaluation of potential valvular dysfunction may be warranted if clinically indicated. 6. Large cystic lesion in the right adnexal region, presumably of ovarian origin, similar to several recent prior examinations. Given the lack of hypermetabolism on prior PET-CT, this is favored to be benign. 7. Additional incidental findings, as above. Aortic Atherosclerosis (ICD10-I70.0) and Emphysema  (ICD10-J43.9). Electronically Signed   By: Vinnie Langton M.D.   On: 06/21/2017 08:58   Ct Abdomen Pelvis W Contrast  Result Date: 06/21/2017 CLINICAL DATA:  84 year old female with history of lung cancer diagnosed in 2018 status post radiation therapy (completed in September 2018) and chemotherapy (completed in November 2018). EXAM: CT CHEST, ABDOMEN AND PELVIS WITHOUT CONTRAST TECHNIQUE: Multidetector CT imaging of the chest, abdomen and pelvis was performed following the standard protocol without IV contrast. COMPARISON:  CT the chest, abdomen and pelvis 04/23/2017. PET-CT 01/28/2017. Chest CT 01/15/2017. CT the abdomen and pelvis 12/03/2016. FINDINGS: CT CHEST FINDINGS Cardiovascular: Heart size is normal. Small amount of pericardial fluid and/or thickening, similar to the prior study, and unlikely to be of any hemodynamic significance at this time. No associated pericardial calcification. There is aortic atherosclerosis, as well as atherosclerosis of the great vessels of the mediastinum and the coronary arteries, including calcified atherosclerotic plaque in the left main, left anterior descending, left circumflex and right coronary arteries. Severe calcifications of the aortic valve. Mediastinum/Nodes: No pathologically enlarged mediastinal or hilar lymph nodes. Moderate-sized hiatal hernia. No axillary lymphadenopathy. Lungs/Pleura: There continues to be complete  collapse of the left upper lobe obscuring the previously treated left upper lobe pulmonary nodule. This does not have a mass-like appearance or abnormal contour to strongly suggest the presence of residual/recurrent disease on today's examination. Some compensatory hyperexpansion of the left lower lobe is noted. Chronic small left pleural effusion lying dependently, similar to the prior examination. Tiny 3 mm nodule near the base of the left hemithorax (axial image 113 of series 4), stable compared to prior examinations, likely benign.  Likewise, a 4 mm nodule in the anterior aspect of the right upper lobe (axial image 56 of series 4) is also unchanged compared to prior studies, also favored to be benign. No other definite suspicious appearing pulmonary nodules or masses are noted. Diffuse bronchial wall thickening with mild to moderate centrilobular and paraseptal emphysema. No acute consolidative airspace disease. No right-sided pleural effusion. Musculoskeletal: Again noted are several chronic compression fractures, including a severe compression fracture of T10 with 80% loss of anterior vertebral body height. Old healed fracture of the anterolateral aspect of the left sixth rib, similar to prior examinations. There are no aggressive appearing lytic or blastic lesions noted in the visualized portions of the skeleton. CT ABDOMEN PELVIS FINDINGS Hepatobiliary: No suspicious cystic or solid hepatic lesions. No intra or extrahepatic biliary ductal dilatation. Pneumobilia, similar to prior examinations, presumably from prior sphincterotomy. Other than gas in the nondependent portion of the lumen of the gallbladder, gallbladder is otherwise normal in appearance. Pancreas: No pancreatic mass. No pancreatic ductal dilatation. Small amount of gas in the pancreatic duct, presumably from prior sphincterotomy. No pancreatic or peripancreatic fluid or inflammatory changes. Spleen: Unremarkable. Adrenals/Urinary Tract: Bilateral kidneys and bilateral adrenal glands are normal in appearance. No hydroureteronephrosis. Urinary bladder is normal in appearance. Stomach/Bowel: Intraabdominal portion of the stomach is normal in appearance. No pathologic dilatation of small bowel or colon. The appendix is not confidently identified and may be surgically absent. Regardless, there are no inflammatory changes noted adjacent to the cecum to suggest the presence of an acute appendicitis at this time. Vascular/Lymphatic: Aortic atherosclerosis, without evidence of aneurysm  or dissection in the abdominal or pelvic vasculature. No lymphadenopathy noted in the abdomen or pelvis. Reproductive: Uterus and left ovary are atrophic. In the right adnexa there is again a very large low-attenuation lesion which currently measures 7.6 x 6.6 x 9.3 cm (axial image 95 of series 2 and coronal image 97 of series 5), similar to recent prior examinations. Other: No significant volume of ascites.  No pneumoperitoneum. Musculoskeletal: Chronic compression fractures are again noted at L1, L4 and L5, similar to the prior study, most severe at L1 where there is 80% loss of central vertebral body height. There are no aggressive appearing lytic or blastic lesions noted in the visualized portions of the skeleton. IMPRESSION: 1. Stable post treatment changes in the thorax with complete chronic collapse of the left upper lobe. No definite findings to suggest locally recurrent disease or metastatic disease elsewhere in the chest, abdomen or pelvis. 2. Small chronic left pleural effusion lying dependently, similar to prior examinations. 3. Mild diffuse bronchial wall thickening with mild to moderate centrilobular and paraseptal emphysema; imaging findings suggestive of underlying COPD. 4. Aortic atherosclerosis, in addition to left main and 3 vessel coronary artery disease. Please note that although the presence of coronary artery calcium documents the presence of coronary artery disease, the severity of this disease and any potential stenosis cannot be assessed on this non-gated CT examination. Assessment for potential risk factor modification,  dietary therapy or pharmacologic therapy may be warranted, if clinically indicated. 5. There are severe calcifications of the aortic valve. Echocardiographic correlation for evaluation of potential valvular dysfunction may be warranted if clinically indicated. 6. Large cystic lesion in the right adnexal region, presumably of ovarian origin, similar to several recent prior  examinations. Given the lack of hypermetabolism on prior PET-CT, this is favored to be benign. 7. Additional incidental findings, as above. Aortic Atherosclerosis (ICD10-I70.0) and Emphysema (ICD10-J43.9). Electronically Signed   By: Vinnie Langton M.D.   On: 06/21/2017 08:58    ASSESSMENT AND PLAN: This is a very pleasant 84 years old white female with a stage IV non-small cell lung cancer, squamous cell carcinoma.  She is currently undergoing systemic chemotherapy with carboplatin and paclitaxel status post 5 cycles. The patient had repeat CT scan of the chest, abdomen and pelvis performed recently.  I personally and independently reviewed the scans and discussed the results with the patient and her sister-in-law. Her scan showed no concerning findings for disease progression. I recommended for the patient to continue on observation with repeat CT scan of the chest, abdomen and pelvis in 3 months for restaging of her disease. She was advised to call immediately if she has any concerning symptoms in the interval. The patient voices understanding of current disease status and treatment options and is in agreement with the current care plan. All questions were answered. The patient knows to call the clinic with any problems, questions or concerns. We can certainly see the patient much sooner if necessary.  I spent 15 minutes counseling the patient face to face. The total time spent in the appointment was 25 minutes.  Disclaimer: This note was dictated with voice recognition software. Similar sounding words can inadvertently be transcribed and may not be corrected upon review.

## 2017-07-04 NOTE — Telephone Encounter (Signed)
Appointments scheduled AVS/Calendar printed per 4/4 los

## 2017-07-04 NOTE — Progress Notes (Signed)
Per Joy at [employer] wt 06/05/17-122.4 lbs.

## 2017-08-28 IMAGING — MR MR ABDOMEN WO/W CM MRCP
11 of 23 series · 21 of 48 positions shown · IV contrast (multihance)
Comparison: CT 02/25/2016, ultrasound 02/24/2016

CLINICAL DATA: Abnormal LFTs

EXAM:
MRI ABDOMEN WITHOUT AND WITH CONTRAST (INCLUDING MRCP)
TECHNIQUE: Multiplanar multisequence MR imaging of the abdomen was performed
both before and after the administration of intravenous contrast.
Heavily T2-weighted images of the biliary and pancreatic ducts were
obtained, and three-dimensional MRCP images were rendered by post
processing.
CONTRAST:  15mL MULTIHANCE GADOBENATE DIMEGLUMINE 529 MG/ML IV SOLN

[Series 4: T2 fat-sat · axial · 5.0mm · 0.78mm/px · z∈[+49,+214]mm · 2 of 34 slices shown]
[im 1/34]
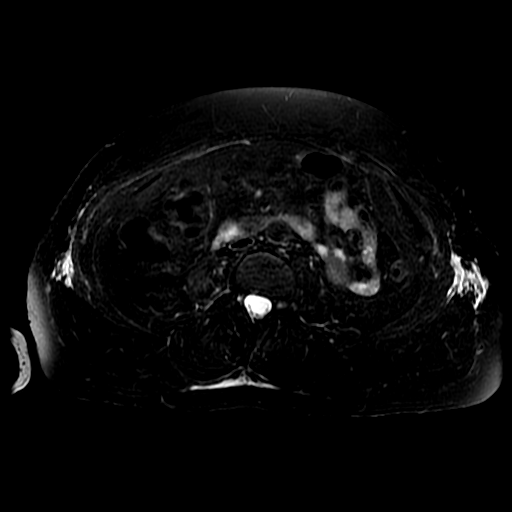
[im 34/34]
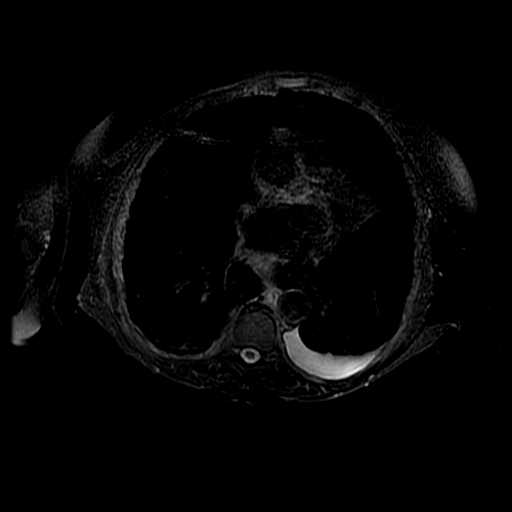

[Series 5: DWI b500 · axial · 6.0mm · 1.48mm/px · z∈[+8,+234]mm · 3 of 60 slices shown]
[im 1/60]
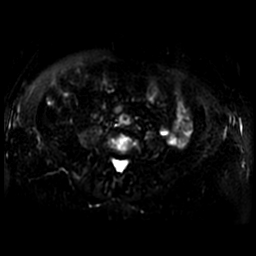
[im 30/60]
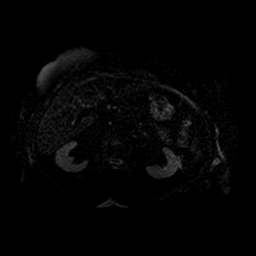
[im 60/60]
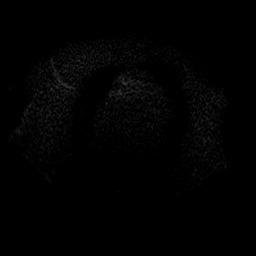

[Series 7: MRCP · coronal · 2.0mm · 0.70mm/px · 1 of 38 slices shown (1 of 2)]
[im 1/38]
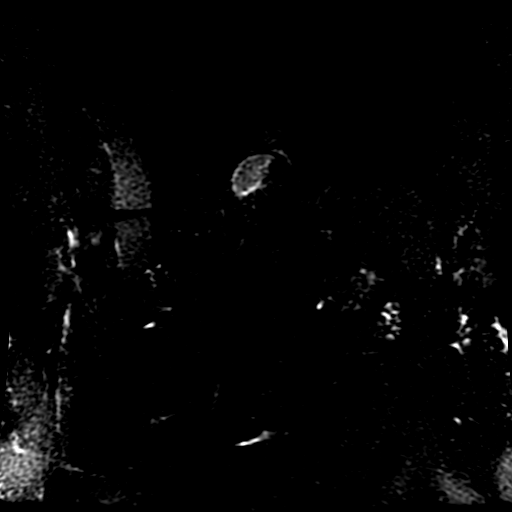

[Series 10: ax dualecho · axial · 5.0mm · 0.78mm/px · z∈[+46,+216]mm · 2 of 70 slices shown]
[im 1/70]
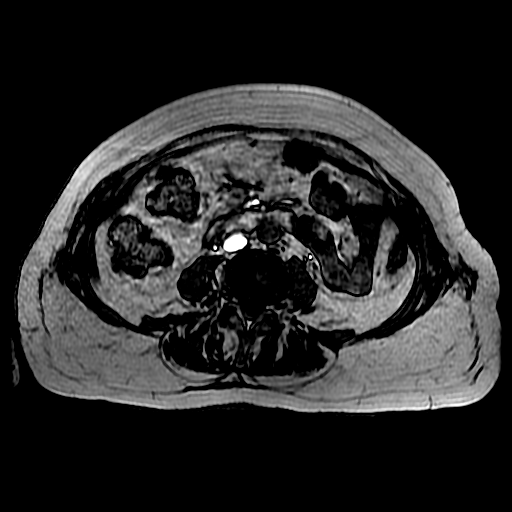
[im 70/70]
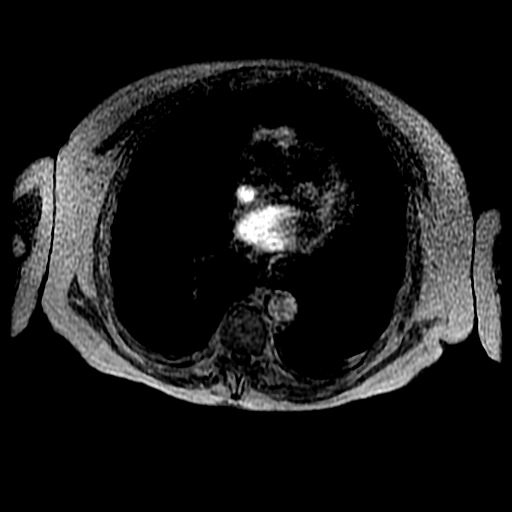

[Series 11: MRCP · oblique · 40.0mm · 0.70mm/px · 1 of 6 slices shown (2 of 2)]
[im 1/6]
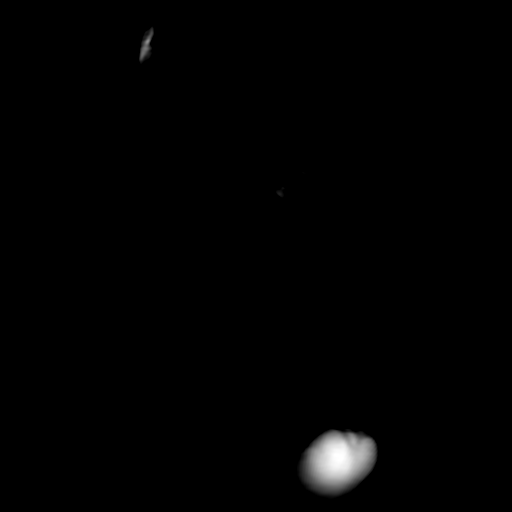

[Series 12: bSSFP fat-sat · coronal · 5.0mm · 0.70mm/px · 1 of 37 slices shown]
[im 1/37]
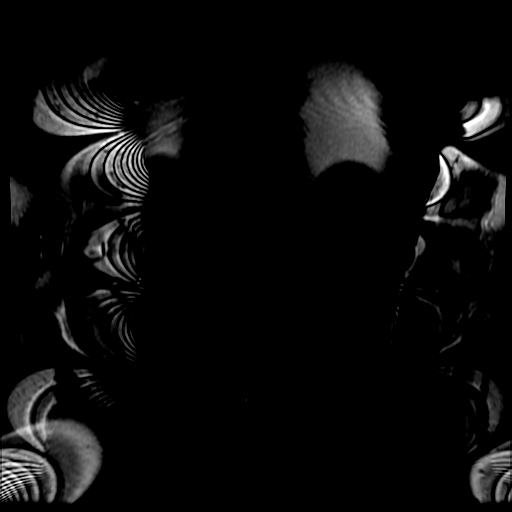

[Series 13: T2 · axial · 5.0mm · 0.78mm/px · 1 of 35 slices shown]
[im 1/35]
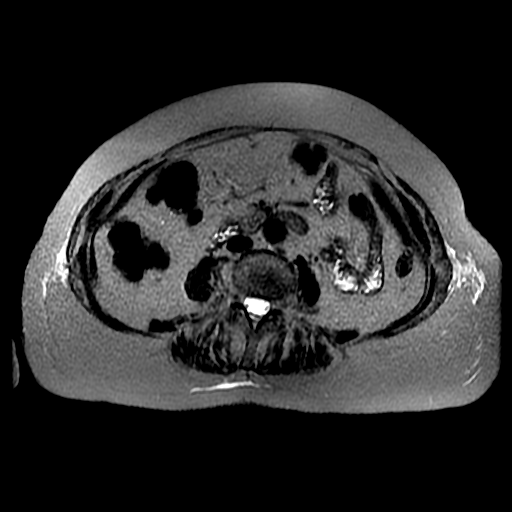

[Series 15: T1 dynamic · coronal · delayed · 4.0mm · 0.78mm/px · 3 of 112 slices shown]
[im 1/112]
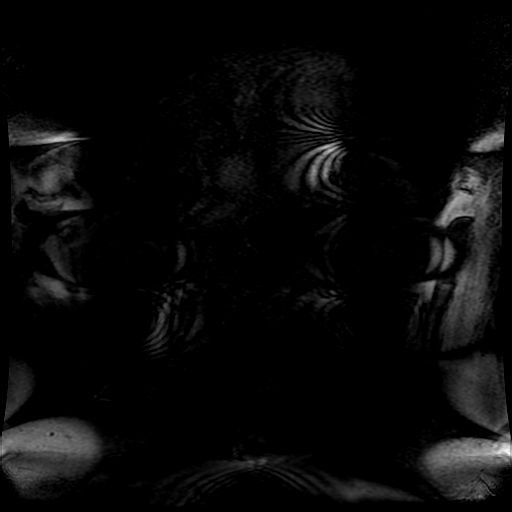
[im 56/112]
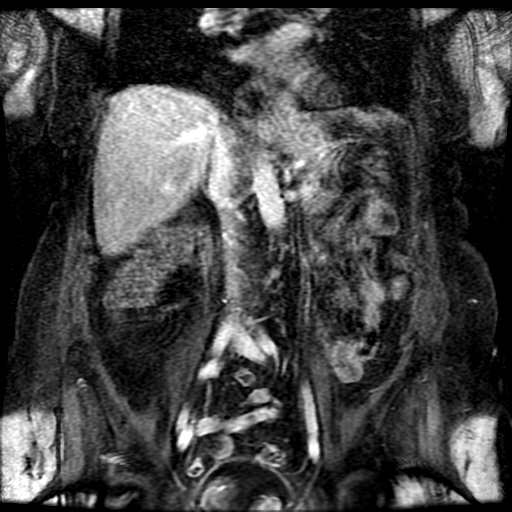
[im 112/112]
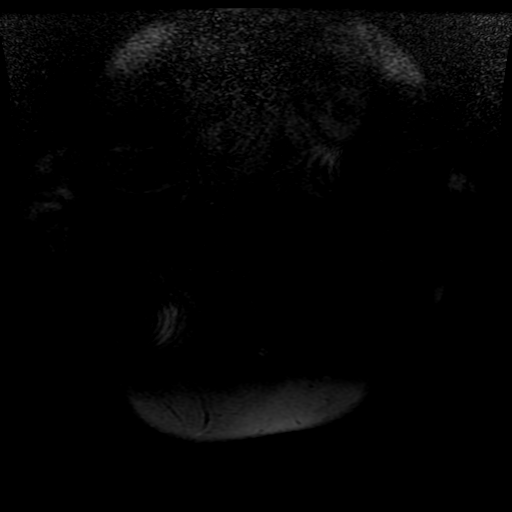

[Series 501: DWI · axial · 6.0mm · 1.48mm/px · 1 of 30 slices shown]
[im 1/30]
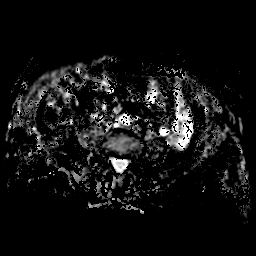

[Series 600: reformatted · axial · 1.6mm · 0.62mm/px · z∈[+99,+230]mm · 3 of 96 slices shown (1 of 2)]
[im 1/96]
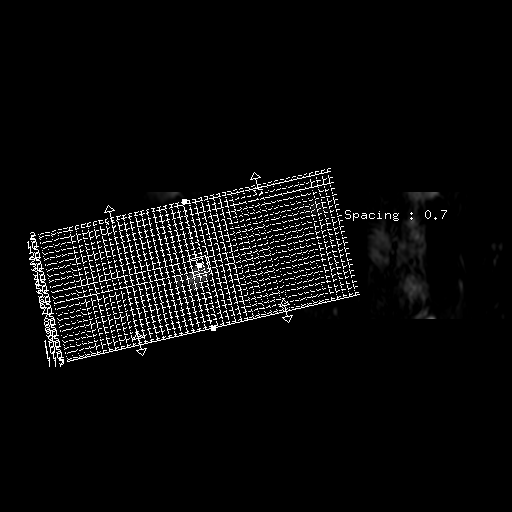
[im 48/96]
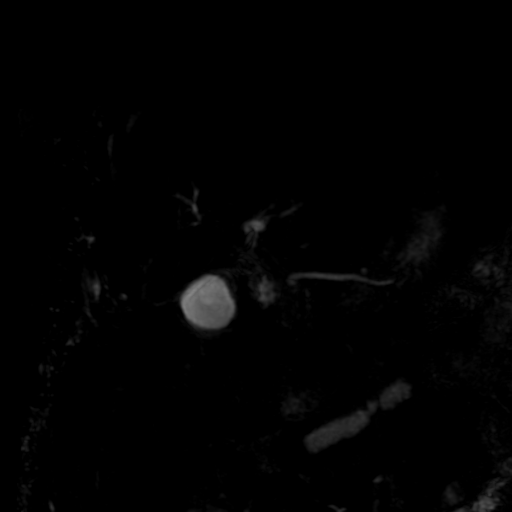
[im 96/96]
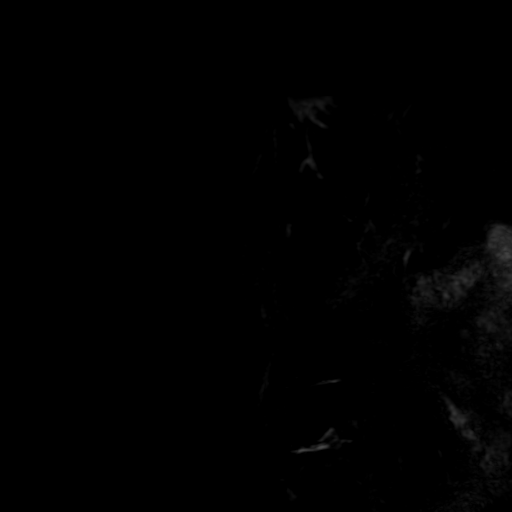

[Series 601: reformatted · sagittal · 100.0mm · 0.51mm/px · 3 of 126 slices shown (2 of 2)]
[im 1/126]
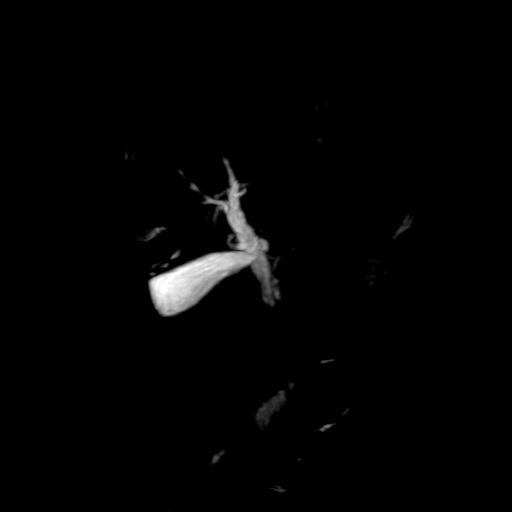
[im 42/126]
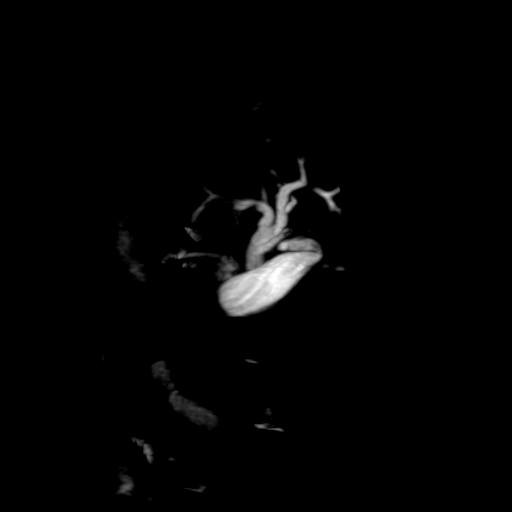
[im 84/126]
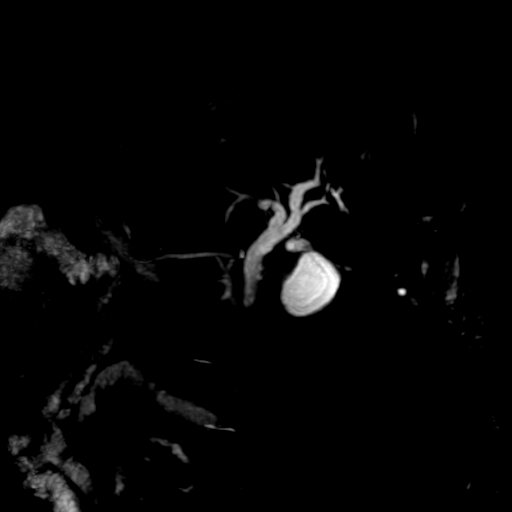

[21 of 48 positions shown; findings below may reference images not displayed]

FINDINGS: Lower chest: Small bilateral pleural effusions left greater than
right are visualized. Probable atelectasis at the posterior lung
bases and within the lingula. Increased T2 signal in the pericardium
is felt secondary to inhomogeneous fat suppression.

Hepatobiliary: Signal intensity is within normal limits. No focal
hepatic abnormality is visualized. No focal contrast-enhancing
lesions. Mild central intra hepatic biliary dilatation. No discrete
filling defects are visualized within the slightly dilated intra
hepatic ducts. Extrahepatic common bile duct is dilated up to 9 mm.
Best seen on the axial T2 fat suppressed images is a 3 mm hypo
intensity within the distal common bile duct at the head of the
pancreas, series 4, image number 30. No additional filling defects
are visualized along the course of the common bile duct. No focal
filling defects within the imaged gallbladder. No surrounding
gallbladder edema. No ductal enhancement.

Pancreas: No enlargement of the pancreatic duct. No peripancreatic
fluid collections.

Spleen:  Normal in size.  No focal abnormalities.

Adrenals/Urinary Tract: Adrenal glands are within normal limits.
Mild perinephric edema, nonspecific. No focal renal lesions are
visualized.

Stomach/Bowel: Limited evaluation by MRI. Small moderate hiatal
hernia. No grossly dilated bowel in the upper abdomen. No bowel wall
edema.

Vascular/Lymphatic: Imaged aorta within normal limits for size. No
abnormally enlarged lymph nodes.

Other: Small amount of free fluid around the spleen. Small amount of
subcutaneous edema within the lateral abdominal wall and posterior
subcutaneous soft tissues.

Musculoskeletal: No significant marrow edema.
IMPRESSION: 1. Mild central intra hepatic biliary dilatation and mild dilatation
of extrahepatic common bile duct, measuring up to 9 mm. Tiny 3 mm
hypo intense filling defect within the distal common bile duct at
the head of the pancreas, could reflect a small stone. Otherwise no
additional hypo intense filling defects, debris, or abnormal ductal
enhancement identified.
2. Small bilateral pleural effusions. Small amount of free fluid in
the upper abdomen.

## 2017-09-06 IMAGING — CR DG CHEST 1V PORT
1 series · 1 of 1 positions shown · non-contrast
Comparison: CXR exams dating back through 09/25/2010

CLINICAL DATA: Hypoxia

EXAM:
PORTABLE CHEST 1 VIEW

[AP]
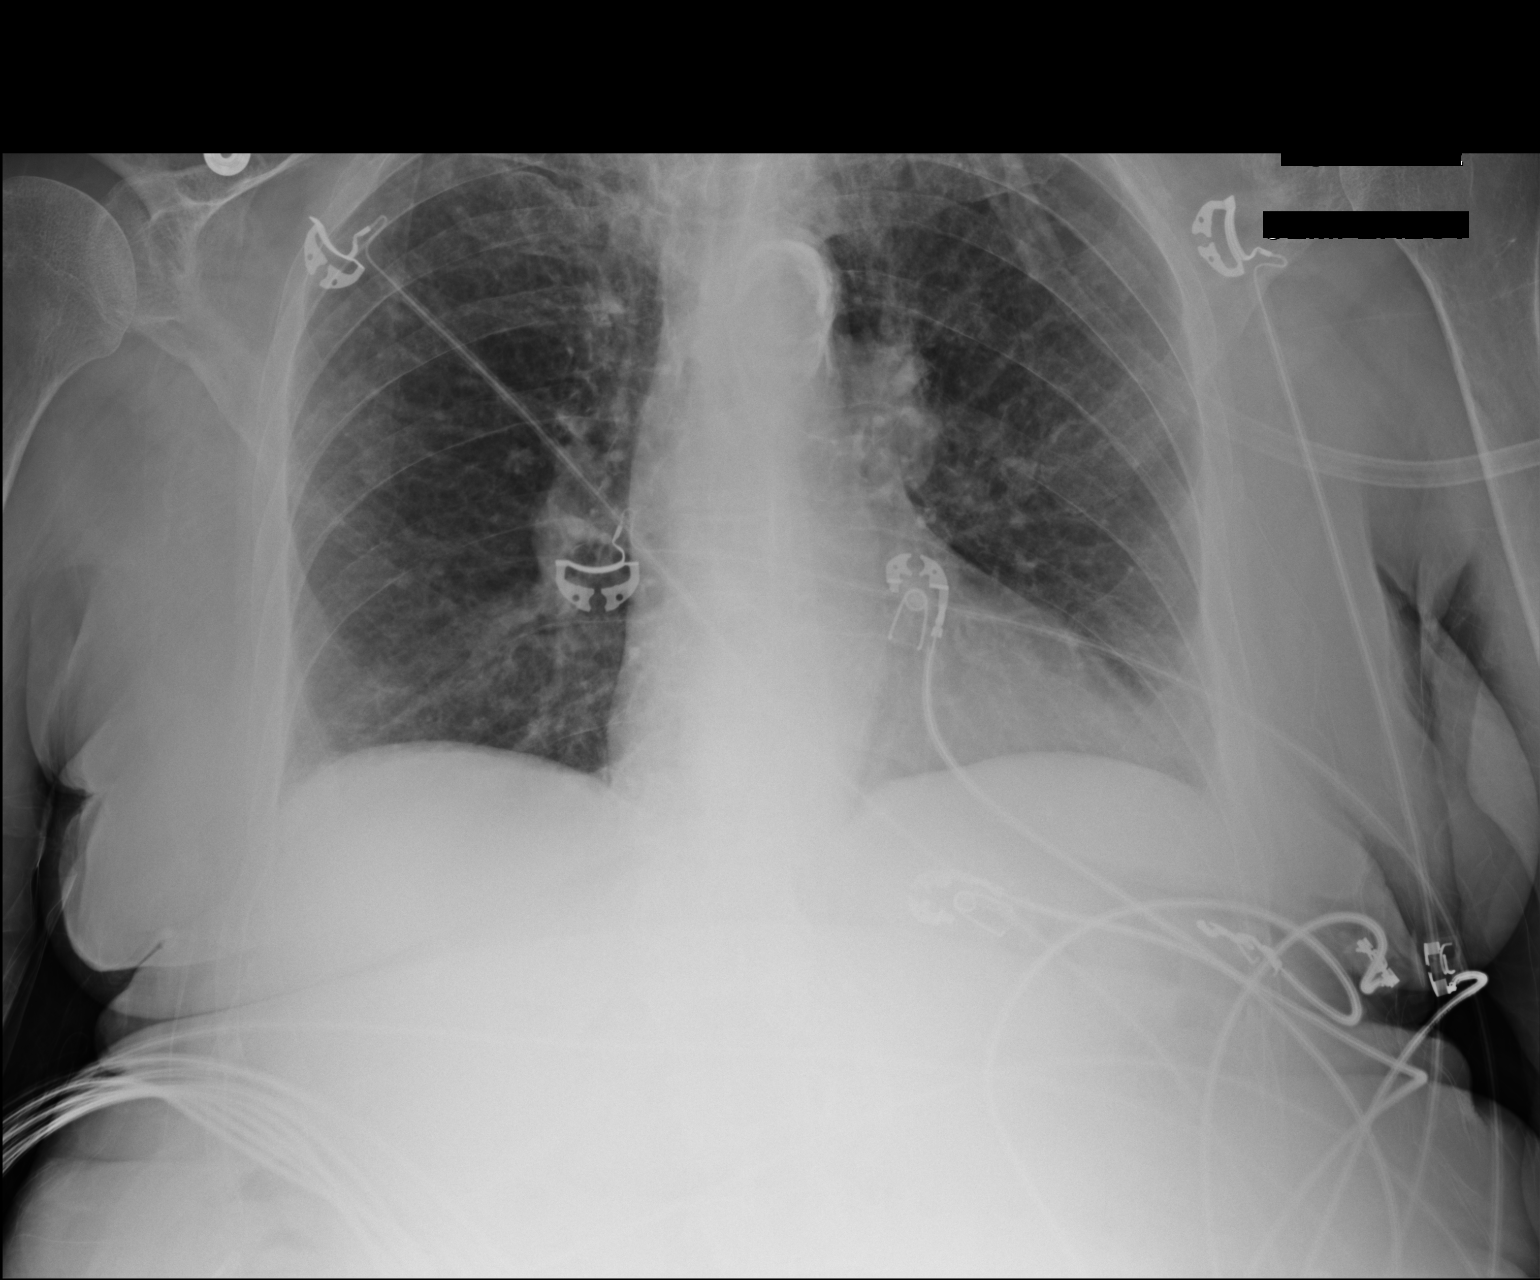

[1 of 1 positions shown; findings below may reference images not displayed]

FINDINGS: Pulmonary vascular redistribution consistent with mild CHF. No
effusion or pneumothorax. No pulmonary consolidation. Heart is
top-normal in size. There is aortic arch atherosclerosis as before.
Mild prominence of the hila is stable dating back to 3633 and may
reflect pulmonary hypertension. The normally hyperinflated
appearance of the lungs is less so on current exam. No acute nor
suspicious osseous abnormalities.
IMPRESSION: Mild CHF.  Aortic atherosclerosis.

## 2017-09-30 ENCOUNTER — Other Ambulatory Visit: Payer: Medicare Other

## 2017-09-30 DEATH — deceased

## 2017-10-02 ENCOUNTER — Ambulatory Visit: Payer: Medicare Other | Admitting: Internal Medicine

## 2018-07-12 ENCOUNTER — Other Ambulatory Visit: Payer: Self-pay | Admitting: Nurse Practitioner
